# Patient Record
Sex: Male | Born: 1945 | Race: White | Hispanic: No | Smoking: Former smoker
Health system: Southern US, Community
[De-identification: ages and names within clinical notes are randomized; demographics above are authoritative.]

## PROBLEM LIST (undated history)

## (undated) DIAGNOSIS — Z973 Presence of spectacles and contact lenses: Secondary | ICD-10-CM

## (undated) DIAGNOSIS — Z86718 Personal history of other venous thrombosis and embolism: Secondary | ICD-10-CM

## (undated) DIAGNOSIS — Z5189 Encounter for other specified aftercare: Secondary | ICD-10-CM

## (undated) DIAGNOSIS — G5601 Carpal tunnel syndrome, right upper limb: Secondary | ICD-10-CM

## (undated) DIAGNOSIS — I25810 Atherosclerosis of coronary artery bypass graft(s) without angina pectoris: Secondary | ICD-10-CM

## (undated) DIAGNOSIS — M1990 Unspecified osteoarthritis, unspecified site: Secondary | ICD-10-CM

## (undated) DIAGNOSIS — I2510 Atherosclerotic heart disease of native coronary artery without angina pectoris: Secondary | ICD-10-CM

## (undated) DIAGNOSIS — I10 Essential (primary) hypertension: Secondary | ICD-10-CM

## (undated) DIAGNOSIS — E785 Hyperlipidemia, unspecified: Secondary | ICD-10-CM

## (undated) DIAGNOSIS — K219 Gastro-esophageal reflux disease without esophagitis: Secondary | ICD-10-CM

## (undated) DIAGNOSIS — R7301 Impaired fasting glucose: Secondary | ICD-10-CM

## (undated) DIAGNOSIS — J302 Other seasonal allergic rhinitis: Secondary | ICD-10-CM

## (undated) DIAGNOSIS — T8859XA Other complications of anesthesia, initial encounter: Secondary | ICD-10-CM

## (undated) DIAGNOSIS — T4145XA Adverse effect of unspecified anesthetic, initial encounter: Secondary | ICD-10-CM

## (undated) HISTORY — DX: Atherosclerosis of coronary artery bypass graft(s) without angina pectoris: I25.810

## (undated) HISTORY — DX: Hyperlipidemia, unspecified: E78.5

## (undated) HISTORY — DX: Personal history of other venous thrombosis and embolism: Z86.718

## (undated) HISTORY — DX: Encounter for other specified aftercare: Z51.89

## (undated) HISTORY — DX: Carpal tunnel syndrome, right upper limb: G56.01

## (undated) HISTORY — PX: TONSILLECTOMY AND ADENOIDECTOMY: SHX28

## (undated) HISTORY — PX: EXPLORATION POST OPERATIVE OPEN HEART: SHX5061

## (undated) HISTORY — PX: APPENDECTOMY: SHX54

## (undated) HISTORY — PX: COLONOSCOPY: SHX174

## (undated) HISTORY — DX: Essential (primary) hypertension: I10

## (undated) HISTORY — PX: CORONARY ARTERY BYPASS GRAFT: SHX141

## (undated) HISTORY — DX: Impaired fasting glucose: R73.01

## (undated) HISTORY — DX: Unspecified osteoarthritis, unspecified site: M19.90

## (undated) HISTORY — PX: EYE SURGERY: SHX253

## (undated) HISTORY — PX: SINUS ENDO W/FUSION: SHX777

---

## 1978-12-12 HISTORY — PX: KNEE ARTHROCENTESIS: SUR44

## 1983-12-13 HISTORY — PX: SHOULDER ACROMIOPLASTY: SHX6093

## 2004-02-23 ENCOUNTER — Inpatient Hospital Stay (HOSPITAL_COMMUNITY): Admission: RE | Admit: 2004-02-23 | Discharge: 2004-02-24 | Payer: Self-pay | Admitting: Neurosurgery

## 2004-06-18 ENCOUNTER — Ambulatory Visit (HOSPITAL_COMMUNITY): Admission: RE | Admit: 2004-06-18 | Discharge: 2004-06-19 | Payer: Self-pay | Admitting: Interventional Cardiology

## 2004-06-24 ENCOUNTER — Inpatient Hospital Stay (HOSPITAL_COMMUNITY)
Admission: RE | Admit: 2004-06-24 | Discharge: 2004-06-28 | Payer: Self-pay | Admitting: Thoracic Surgery (Cardiothoracic Vascular Surgery)

## 2004-07-19 ENCOUNTER — Encounter (HOSPITAL_COMMUNITY): Admission: RE | Admit: 2004-07-19 | Discharge: 2004-10-17 | Payer: Self-pay | Admitting: Interventional Cardiology

## 2004-07-27 ENCOUNTER — Encounter: Admission: RE | Admit: 2004-07-27 | Discharge: 2004-07-27 | Payer: Self-pay | Admitting: Gastroenterology

## 2004-10-18 ENCOUNTER — Encounter (HOSPITAL_COMMUNITY): Admission: RE | Admit: 2004-10-18 | Discharge: 2004-10-20 | Payer: Self-pay | Admitting: Interventional Cardiology

## 2004-10-21 ENCOUNTER — Encounter (HOSPITAL_COMMUNITY): Admission: RE | Admit: 2004-10-21 | Discharge: 2005-01-19 | Payer: Self-pay | Admitting: Interventional Cardiology

## 2005-02-09 ENCOUNTER — Encounter (HOSPITAL_COMMUNITY): Admission: RE | Admit: 2005-02-09 | Discharge: 2005-05-10 | Payer: Self-pay | Admitting: Interventional Cardiology

## 2005-05-12 ENCOUNTER — Encounter (HOSPITAL_COMMUNITY): Admission: RE | Admit: 2005-05-12 | Discharge: 2005-08-10 | Payer: Self-pay | Admitting: Interventional Cardiology

## 2005-08-12 ENCOUNTER — Encounter (HOSPITAL_COMMUNITY): Admission: RE | Admit: 2005-08-12 | Discharge: 2005-11-10 | Payer: Self-pay | Admitting: Interventional Cardiology

## 2005-11-11 ENCOUNTER — Encounter (HOSPITAL_COMMUNITY): Admission: RE | Admit: 2005-11-11 | Discharge: 2006-02-09 | Payer: Self-pay | Admitting: Interventional Cardiology

## 2006-01-09 ENCOUNTER — Encounter: Admission: RE | Admit: 2006-01-09 | Discharge: 2006-01-09 | Payer: Self-pay | Admitting: Otolaryngology

## 2006-03-12 ENCOUNTER — Encounter (HOSPITAL_COMMUNITY): Admission: RE | Admit: 2006-03-12 | Discharge: 2006-06-10 | Payer: Self-pay | Admitting: Interventional Cardiology

## 2006-06-13 ENCOUNTER — Encounter (HOSPITAL_COMMUNITY): Admission: RE | Admit: 2006-06-13 | Discharge: 2006-09-11 | Payer: Self-pay | Admitting: Interventional Cardiology

## 2006-09-11 ENCOUNTER — Ambulatory Visit: Payer: Self-pay | Admitting: Family Medicine

## 2006-09-12 ENCOUNTER — Encounter (HOSPITAL_COMMUNITY): Admission: RE | Admit: 2006-09-12 | Discharge: 2006-12-11 | Payer: Self-pay | Admitting: Interventional Cardiology

## 2006-12-12 ENCOUNTER — Encounter (HOSPITAL_COMMUNITY): Admission: RE | Admit: 2006-12-12 | Discharge: 2007-03-12 | Payer: Self-pay | Admitting: Interventional Cardiology

## 2007-03-13 ENCOUNTER — Encounter (HOSPITAL_COMMUNITY): Admission: RE | Admit: 2007-03-13 | Discharge: 2007-06-11 | Payer: Self-pay | Admitting: Interventional Cardiology

## 2007-06-12 ENCOUNTER — Encounter (HOSPITAL_COMMUNITY): Admission: RE | Admit: 2007-06-12 | Discharge: 2007-09-10 | Payer: Self-pay | Admitting: Interventional Cardiology

## 2007-09-12 ENCOUNTER — Encounter (HOSPITAL_COMMUNITY): Admission: RE | Admit: 2007-09-12 | Discharge: 2007-12-11 | Payer: Self-pay | Admitting: Interventional Cardiology

## 2007-12-13 ENCOUNTER — Encounter (HOSPITAL_COMMUNITY): Admission: RE | Admit: 2007-12-13 | Discharge: 2008-01-11 | Payer: Self-pay | Admitting: Interventional Cardiology

## 2008-01-13 ENCOUNTER — Encounter (HOSPITAL_COMMUNITY): Admission: RE | Admit: 2008-01-13 | Discharge: 2008-04-12 | Payer: Self-pay | Admitting: Interventional Cardiology

## 2008-04-13 ENCOUNTER — Encounter (HOSPITAL_COMMUNITY): Admission: RE | Admit: 2008-04-13 | Discharge: 2008-07-12 | Payer: Self-pay | Admitting: Interventional Cardiology

## 2008-04-22 ENCOUNTER — Encounter: Admission: RE | Admit: 2008-04-22 | Discharge: 2008-04-22 | Payer: Self-pay | Admitting: Gastroenterology

## 2008-07-13 ENCOUNTER — Encounter (HOSPITAL_COMMUNITY): Admission: RE | Admit: 2008-07-13 | Discharge: 2008-10-11 | Payer: Self-pay | Admitting: Interventional Cardiology

## 2008-10-13 ENCOUNTER — Encounter (HOSPITAL_COMMUNITY): Admission: RE | Admit: 2008-10-13 | Discharge: 2009-01-11 | Payer: Self-pay | Admitting: Interventional Cardiology

## 2009-01-12 ENCOUNTER — Encounter (HOSPITAL_COMMUNITY): Admission: RE | Admit: 2009-01-12 | Discharge: 2009-04-12 | Payer: Self-pay | Admitting: Interventional Cardiology

## 2009-03-17 ENCOUNTER — Ambulatory Visit: Payer: Self-pay | Admitting: Family Medicine

## 2009-03-19 ENCOUNTER — Ambulatory Visit: Payer: Self-pay | Admitting: Family Medicine

## 2009-04-13 ENCOUNTER — Encounter (HOSPITAL_COMMUNITY): Admission: RE | Admit: 2009-04-13 | Discharge: 2009-07-12 | Payer: Self-pay | Admitting: Interventional Cardiology

## 2009-07-13 ENCOUNTER — Encounter (HOSPITAL_COMMUNITY): Admission: RE | Admit: 2009-07-13 | Discharge: 2009-10-11 | Payer: Self-pay | Admitting: Interventional Cardiology

## 2009-10-12 ENCOUNTER — Encounter (HOSPITAL_COMMUNITY): Admission: RE | Admit: 2009-10-12 | Discharge: 2010-01-10 | Payer: Self-pay | Admitting: Interventional Cardiology

## 2010-01-12 ENCOUNTER — Encounter (HOSPITAL_COMMUNITY): Admission: RE | Admit: 2010-01-12 | Discharge: 2010-04-12 | Payer: Self-pay | Admitting: Interventional Cardiology

## 2010-04-13 ENCOUNTER — Encounter (HOSPITAL_COMMUNITY): Admission: RE | Admit: 2010-04-13 | Discharge: 2010-07-12 | Payer: Self-pay | Admitting: Interventional Cardiology

## 2010-07-13 ENCOUNTER — Encounter (HOSPITAL_COMMUNITY): Admission: RE | Admit: 2010-07-13 | Discharge: 2010-10-11 | Payer: Self-pay | Admitting: Interventional Cardiology

## 2010-10-12 ENCOUNTER — Encounter (HOSPITAL_COMMUNITY)
Admission: RE | Admit: 2010-10-12 | Discharge: 2011-01-10 | Payer: Self-pay | Source: Home / Self Care | Attending: Interventional Cardiology | Admitting: Interventional Cardiology

## 2011-01-02 ENCOUNTER — Encounter: Payer: Self-pay | Admitting: Gastroenterology

## 2011-01-02 ENCOUNTER — Encounter: Payer: Self-pay | Admitting: Thoracic Surgery (Cardiothoracic Vascular Surgery)

## 2011-01-13 ENCOUNTER — Encounter (HOSPITAL_COMMUNITY): Payer: Self-pay | Attending: Interventional Cardiology

## 2011-01-13 DIAGNOSIS — Z5189 Encounter for other specified aftercare: Secondary | ICD-10-CM | POA: Insufficient documentation

## 2011-01-13 DIAGNOSIS — I10 Essential (primary) hypertension: Secondary | ICD-10-CM | POA: Insufficient documentation

## 2011-01-13 DIAGNOSIS — Z8249 Family history of ischemic heart disease and other diseases of the circulatory system: Secondary | ICD-10-CM | POA: Insufficient documentation

## 2011-01-13 DIAGNOSIS — I2510 Atherosclerotic heart disease of native coronary artery without angina pectoris: Secondary | ICD-10-CM | POA: Insufficient documentation

## 2011-01-14 ENCOUNTER — Encounter (HOSPITAL_COMMUNITY): Payer: Self-pay

## 2011-01-18 ENCOUNTER — Encounter (HOSPITAL_COMMUNITY): Payer: Self-pay

## 2011-01-19 ENCOUNTER — Encounter (HOSPITAL_COMMUNITY): Payer: Self-pay | Attending: Interventional Cardiology

## 2011-01-20 ENCOUNTER — Encounter (HOSPITAL_COMMUNITY): Payer: Self-pay

## 2011-01-21 ENCOUNTER — Encounter (HOSPITAL_COMMUNITY): Payer: Self-pay

## 2011-01-25 ENCOUNTER — Encounter (HOSPITAL_COMMUNITY): Payer: Self-pay

## 2011-01-26 ENCOUNTER — Encounter (HOSPITAL_COMMUNITY): Payer: Self-pay

## 2011-01-27 ENCOUNTER — Encounter (HOSPITAL_COMMUNITY): Payer: Self-pay

## 2011-01-28 ENCOUNTER — Encounter (HOSPITAL_COMMUNITY): Payer: Self-pay

## 2011-02-01 ENCOUNTER — Encounter (HOSPITAL_COMMUNITY): Payer: Self-pay

## 2011-02-03 ENCOUNTER — Encounter (HOSPITAL_COMMUNITY): Payer: Self-pay

## 2011-02-04 ENCOUNTER — Encounter (HOSPITAL_COMMUNITY): Payer: Self-pay

## 2011-02-08 ENCOUNTER — Encounter (HOSPITAL_COMMUNITY): Payer: Self-pay

## 2011-02-10 ENCOUNTER — Encounter (HOSPITAL_COMMUNITY): Payer: Self-pay | Attending: Interventional Cardiology

## 2011-02-10 DIAGNOSIS — Z8249 Family history of ischemic heart disease and other diseases of the circulatory system: Secondary | ICD-10-CM | POA: Insufficient documentation

## 2011-02-10 DIAGNOSIS — I10 Essential (primary) hypertension: Secondary | ICD-10-CM | POA: Insufficient documentation

## 2011-02-10 DIAGNOSIS — Z5189 Encounter for other specified aftercare: Secondary | ICD-10-CM | POA: Insufficient documentation

## 2011-02-10 DIAGNOSIS — I2510 Atherosclerotic heart disease of native coronary artery without angina pectoris: Secondary | ICD-10-CM | POA: Insufficient documentation

## 2011-02-11 ENCOUNTER — Encounter (HOSPITAL_COMMUNITY): Payer: Self-pay

## 2011-02-15 ENCOUNTER — Encounter (HOSPITAL_COMMUNITY): Payer: Self-pay

## 2011-02-17 ENCOUNTER — Encounter (HOSPITAL_COMMUNITY): Payer: Self-pay

## 2011-02-18 ENCOUNTER — Encounter (HOSPITAL_COMMUNITY): Payer: Self-pay

## 2011-02-22 ENCOUNTER — Encounter (HOSPITAL_COMMUNITY): Payer: Self-pay

## 2011-02-23 ENCOUNTER — Encounter (HOSPITAL_COMMUNITY): Payer: Self-pay

## 2011-02-24 ENCOUNTER — Encounter (HOSPITAL_COMMUNITY): Payer: Self-pay

## 2011-02-25 ENCOUNTER — Encounter (HOSPITAL_COMMUNITY): Payer: Self-pay

## 2011-03-01 ENCOUNTER — Encounter (HOSPITAL_COMMUNITY): Payer: Self-pay

## 2011-03-03 ENCOUNTER — Encounter (HOSPITAL_COMMUNITY): Payer: Self-pay

## 2011-03-04 ENCOUNTER — Encounter (HOSPITAL_COMMUNITY): Payer: Self-pay

## 2011-03-08 ENCOUNTER — Encounter (HOSPITAL_COMMUNITY): Payer: Self-pay

## 2011-03-10 ENCOUNTER — Encounter (HOSPITAL_COMMUNITY): Payer: Self-pay

## 2011-03-11 ENCOUNTER — Encounter (HOSPITAL_COMMUNITY): Payer: Self-pay

## 2011-03-15 ENCOUNTER — Encounter (HOSPITAL_COMMUNITY): Payer: Self-pay | Attending: Interventional Cardiology

## 2011-03-15 DIAGNOSIS — Z8249 Family history of ischemic heart disease and other diseases of the circulatory system: Secondary | ICD-10-CM | POA: Insufficient documentation

## 2011-03-15 DIAGNOSIS — I2510 Atherosclerotic heart disease of native coronary artery without angina pectoris: Secondary | ICD-10-CM | POA: Insufficient documentation

## 2011-03-15 DIAGNOSIS — Z5189 Encounter for other specified aftercare: Secondary | ICD-10-CM | POA: Insufficient documentation

## 2011-03-15 DIAGNOSIS — I10 Essential (primary) hypertension: Secondary | ICD-10-CM | POA: Insufficient documentation

## 2011-03-17 ENCOUNTER — Encounter (HOSPITAL_COMMUNITY): Payer: Self-pay

## 2011-03-18 ENCOUNTER — Encounter (HOSPITAL_COMMUNITY): Payer: Self-pay

## 2011-03-22 ENCOUNTER — Encounter (HOSPITAL_COMMUNITY): Payer: Self-pay

## 2011-03-23 ENCOUNTER — Encounter (HOSPITAL_COMMUNITY): Payer: Self-pay

## 2011-03-24 ENCOUNTER — Encounter (HOSPITAL_COMMUNITY): Payer: Self-pay

## 2011-03-29 ENCOUNTER — Encounter (HOSPITAL_COMMUNITY): Payer: Self-pay

## 2011-03-30 ENCOUNTER — Encounter (HOSPITAL_COMMUNITY): Payer: Self-pay

## 2011-03-31 ENCOUNTER — Encounter (HOSPITAL_COMMUNITY): Payer: Self-pay

## 2011-04-05 ENCOUNTER — Encounter (HOSPITAL_COMMUNITY): Payer: Self-pay

## 2011-04-07 ENCOUNTER — Encounter (HOSPITAL_COMMUNITY): Payer: Self-pay

## 2011-04-08 ENCOUNTER — Encounter (HOSPITAL_COMMUNITY): Payer: Self-pay

## 2011-04-12 ENCOUNTER — Encounter (HOSPITAL_COMMUNITY): Payer: Self-pay | Attending: Interventional Cardiology

## 2011-04-12 DIAGNOSIS — I2510 Atherosclerotic heart disease of native coronary artery without angina pectoris: Secondary | ICD-10-CM | POA: Insufficient documentation

## 2011-04-12 DIAGNOSIS — I10 Essential (primary) hypertension: Secondary | ICD-10-CM | POA: Insufficient documentation

## 2011-04-12 DIAGNOSIS — Z8249 Family history of ischemic heart disease and other diseases of the circulatory system: Secondary | ICD-10-CM | POA: Insufficient documentation

## 2011-04-12 DIAGNOSIS — Z5189 Encounter for other specified aftercare: Secondary | ICD-10-CM | POA: Insufficient documentation

## 2011-04-14 ENCOUNTER — Encounter (HOSPITAL_COMMUNITY): Payer: Self-pay

## 2011-04-15 ENCOUNTER — Encounter (HOSPITAL_COMMUNITY): Payer: Self-pay

## 2011-04-19 ENCOUNTER — Encounter (HOSPITAL_COMMUNITY): Payer: Self-pay

## 2011-04-21 ENCOUNTER — Encounter (HOSPITAL_COMMUNITY): Payer: Self-pay

## 2011-04-22 ENCOUNTER — Encounter (HOSPITAL_COMMUNITY): Payer: Self-pay

## 2011-04-26 ENCOUNTER — Encounter (HOSPITAL_COMMUNITY): Payer: Self-pay

## 2011-04-28 ENCOUNTER — Encounter (HOSPITAL_COMMUNITY): Payer: Self-pay

## 2011-04-29 ENCOUNTER — Encounter (HOSPITAL_COMMUNITY): Payer: Self-pay

## 2011-04-29 NOTE — Cardiovascular Report (Signed)
Nathan [name], Nathan [name]                           ACCOUNT NO.:  [id number]   MEDICAL RECORD NO.:  [id number]                   PATIENT TYPE:  OIB   LOCATION:  2041                                 FACILITY:  [facility]   PHYSICIAN:  Lesleigh Noe, M.D.            DATE OF BIRTH:  08-May-1946   DATE OF PROCEDURE:  06/18/2004  DATE OF DISCHARGE:  06/19/2004                              CARDIAC CATHETERIZATION   INDICATION FOR PROCEDURE:  Early positive exercise treadmill test with  severe inferior wall ischemia by Cardiolite scintigraphy, June 10, 2004.   PROCEDURE PERFORMED:  1. Left-heart catheterization.  2. Selective coronary angiography.  3. Left ventriculography.  4. Angio-Seal arteriotomy closure.   DESCRIPTION OF PROCEDURE:  After informed consent, a 6-French sheath was  placed in the right femoral artery using the modified Seldinger technique.  A 6-French A2 multipurpose catheter was used for hemodynamic recordings.  Left ventriculography by hand injection and selective left and right  coronary angiography.  The patient tolerated the procedure without  complications.  A right femoral angiogram was performed by hand injection  into the sheath.  Arteriotomy closure with Angio-Seal was performed without  complications.   RESULTS:  1. Hemodynamic data:     A. Aortic pressure 127/66.     B. Left ventricular pressure 128/12 mmHg.  2. Left ventriculography.  The left ventricle is normal in size and     demonstrates normal overall contractility with EF of 60%.  No MR.  3. Coronary angiography:  The coronary is heavily calcified, particularly     the proximal left coronary bed.     A. Left main coronary:  Irregularities, but no significant obstruction.     B. Left anterior descending coronary:  The LAD reaches the apex but does        not extend around the apex in the intraventricular groove. There are        tight tandem proximal LAD lesions with an 80 to 90% lesion starting at    the ostium and then in the proximal vessel.  The mid vessel is patent,        containing only luminal irregularities.  Three diagonals arise from        this vessel.  No high-grade obstructions are noted in the diagonals.        The mid and distal LAD is of small caliber and somewhat diffusely        diseased with a 60 to 70% stenosis after the second diagonal.     C. Circumflex artery:  Three obtuse marginal branches arise from the        circumflex.  The circumflex contains a high-grade mid-vessel stenosis        at the origin of the second obtuse marginal.  The obtuse marginal also        contains 80 to 90% obstruction.  The third obtuse marginal is  very        large, and supplies the inferolateral wall.     D. Right coronary.  Relatively small in caliber distally.  There is a 99+        percent stenosis before the origin of the small PDA and two small left        ventricular branches.  The right coronary bed may not be graftable.        There also appears to be some collaterals in the distal bed.   CONCLUSIONS:  1. Severe three-vessel coronary disease with subtotal occlusion of the     distal right coronary and high-grade obstruction in the mid circumflex,     threatening the inferior wall.  There are tandem high-grade obstructions     in the proximal left anterior descending.  The patient has relatively     diffuse distal vessel disease, but I believe the left anterior descending     and obtuse marginals are graftable.  There is also probably the ability     to graft the posterior descending artery or left ventricular branch     beyond the high grade obstruction on the distal right.  2. Normal left ventricular function.   PLAN:  1. We will admit the patient to the hospital to expedite surgical     evaluation.  2. We will discontinue Plavix.  3. We will give Lovenox.  4. He should probably stay in the hospital until he can be operated on.                                                Lesleigh Noe, M.D.    HWS/MEDQ  D:  06/18/2004  T:  06/19/2004  Job:  [id number]   cc:   Danise Edge, M.D.  [address]  [place]  [place] [address]  Fax: [phone]   CVTS Office

## 2011-04-29 NOTE — Op Note (Signed)
Nathan [name], Nathan [name]                           ACCOUNT NO.:  [id number]   MEDICAL RECORD NO.:  [id number]                   PATIENT TYPE:  INP   LOCATION:  2304                                 FACILITY:  [facility]   PHYSICIAN:  Salvatore Decent. Cornelius Moras, M.D.              DATE OF BIRTH:  [birth year]   DATE OF PROCEDURE:  06/24/2004  DATE OF DISCHARGE:                                 OPERATIVE REPORT   PREOPERATIVE DIAGNOSIS:  Severe three vessel coronary artery disease with  new onset angina.   POSTOPERATIVE DIAGNOSIS:  Severe three vessel coronary artery disease with  new onset angina.   PROCEDURE:  Median sternotomy for coronary artery bypass grafting x3 (left  internal mammary artery to the distal left anterior descending coronary  artery, right internal mammary artery to second circumflex marginal branch,  saphenous vein graft to the right posterolateral branch, endoscopic  saphenous vein harvest from right thigh).   SURGEON:  Salvatore Decent. Cornelius Moras, M.D.   ASSISTANT:  Pecola Leisure, PA   ANESTHESIA:  General.   INDICATIONS FOR PROCEDURE:  The patient is a 65 year old male with a history  of hypertension, who presents with new onset symptoms of chest pain  consistent with angina.  He was referred to Lyn Records, M.D. and  underwent a stress Cardiolite examination that was abnormal.  He underwent  cardiac catheterization demonstrating severe three vessel coronary artery  disease with coronary anatomy unfavorable for percutaneous coronary  intervention.  Left ventricular function is normal.  A full consultation  note has been dictated previously.  The patient has been counseled at length  regarding the indications, risks, and potential benefits of surgery.  Alternative treatment strategies have been discussed.  He understands and  accepts all associated risks and desires to proceed as described.   DESCRIPTION OF PROCEDURE:  The patient is brought to the operating room on  the above  mentioned date and placed in the supine position on the operating  table.  Central monitoring is established by the anesthesia service under  the care and direction of W. Autumn Patty, M.D.  Specifically, a Swan-  Ganz catheter is placed through the right internal jugular approach.  A  radial arterial line is placed.  Intravenous antibiotics are administered.  Following induction with general endotracheal anesthesia, a Foley catheter  is placed.  By report, the intubation was somewhat difficult due to the  patient's anatomy, but the intubation proceeded uneventfully.   The patient's chest, abdomen, both groins, and both lower extremities are  prepped and draped in the usual sterile fashion.  A median sternotomy  incision is performed and the left internal mammary artery is dissected from  the chest wall and prepared for bypass grafting.  The left internal mammary  artery is good quality conduit.  Following this, the right internal mammary  artery is also dissected from the chest wall and prepared for bypass  grafting.  The right internal mammary artery is also good quality conduit.  Simultaneously saphenous vein is obtained from the patient's right thigh  using endoscopic vein harvest technique. The saphenous vein is felt to be  good quality conduit.  The patient is heparinized systemically.   The pericardium is opened.  The ascending aorta is normal in appearance.  The ascending aorta and the right atrium are cannulated for cardiopulmonary  bypass.  Adequate heparinization is verified.  Cardiopulmonary bypass is  begun and the surface of the heart is inspected.  Distal sites are selected  for coronary artery bypass grafting.  Both internal mammary arteries are  trimmed to appropriate length.  Of note, the right internal mammary artery  is fairly long, but not long enough to reach the area of the distal left  anterior descending coronary artery where the distal anastomosis will need   to be placed.  However, the right internal mammary artery will reach to the  large second circumflex marginal branch when placed through the transverse  sinus posterior to the aorta.  A temperature probe is placed in the left  ventricular septum.  A cardioplegia catheter is placed in the ascending  aorta.   The patient is allowed to cool passively to 32 degrees systemic temperature.  The aortic crossclamp is applied and cardioplegia is delivered initially in  an antegrade fashion through the aortic root.  Iced saline slush is applied  for topical hypothermia.  The initial cardioplegic arrest and myocardial  cooling are felt to be excellent.  Repeat doses of cardioplegia are  administered intermittently throughout the crossclamp portion of the  operation, both through the aortic root and down the subsequently placed  vein grafts to maintain septal temperature below 15 degrees cGy.   The following distal coronary anastomoses are performed:  1) The  posterolateral branch off the distal right coronary artery is grafted with  the saphenous vein graft in an end-to-side fashion.  This is the larger of  the two terminal branches off the distal right coronary artery.  It is still  a small vessel measuring only 1.0 mm in diameter.  It is of good quality at  the site of distal bypass.  2) The second circumflex marginal branch is  grafted with the right internal mammary artery in an end-to-side fashion.  This coronary measures 2.0 mm in diameter and is of good quality.  The right  internal mammary artery is utilized in-situ with the pedicle placed  posterior to the aorta through the transverse sinus.  3) The distal left  anterior descending coronary artery is grafted with the left internal  mammary artery in an end-to-side fashion.  This coronary is diffusely  diseased proximally, but measures 1.8 mm at the site of distal bypass and is  of good quality.  The single proximal saphenous vein graft  anastomosis is performed directly  to the ascending aorta prior to removal of the aortic crossclamp. The septal  temperature is noted to rise rapidly upon reperfusion of the internal  mammary artery grafts.  The aortic crossclamp is removed after a total  crossclamp time of 77 minutes.   The heart begins to beat spontaneously without need for cardioversion.  The  proximal and all three distal coronary anastomoses are inspected for  hemostasis and appropriate graft orientation.  Epicardial pacing wires are  affixed to the right ventricular outflow tract and to the right atrial  appendage.  The patient is rewarmed to 37 degrees cGy temperature.  Normal  sinus rhythm resumes spontaneously.  Atrial pacing is employed to increase  the heart rate.  The patient is weaned from cardiopulmonary bypass without  difficulty.  The patient's rhythm at separation from bypass is atrial paced.  NO inotropic support is required.  Total cardiopulmonary bypass time for the  operation is 95 minutes.   The venous and arterial cannuli are removed uneventfully.  Protamine is  administered to reverse the anticoagulation.  The mediastinum and both left  and right pleural spaces are irrigated with saline solution containing  Vancomycin.  Meticulous surgical hemostasis is ascertained.  There is  moderate coagulopathy.  All possible sites of mechanical bleeding are  carefully identified.  The patient is ultimately transfused one 10-pack of  adult platelets due to continued mild coagulopathy with a history of  preoperative Plavix administration.  The bleeding seems to stop  appropriately after platelets have been administered.   The mediastinum and both pleural spaces are drained with four chest tubes  placed through separate stab incisions inferiorly.  The median sternotomy is  closed with double-strength sternal wire.  The soft tissues anterior to the  sternum are closed in multiple layers and the skin is closed  with a running  subcuticular skin closure.   The patient tolerated the procedure well and is transported to the surgical  intensive care unit in stable condition.  There are no intraoperative  complications.  All needle, sponge, and instrument counts correct at  completion of the operation.                                               Salvatore Decent. Cornelius Moras, M.D.    CHO/MEDQ  D:  06/24/2004  T:  06/24/2004  Job:  [id number]   cc:   Lesleigh Noe, M.D.  [address]  [address]  [place]  [place] [address]  Fax: [phone]   Danise Edge, M.D.  [address]  [place]  [place] [address]  Fax: [phone]

## 2011-04-29 NOTE — Consult Note (Signed)
[name], [name]                           ACCOUNT NO.:  [id number]   MEDICAL RECORD NO.:  [id number]                   PATIENT TYPE:  OIB   LOCATION:  2041                                 FACILITY:  [facility]   PHYSICIAN:  Salvatore Decent. Cornelius Moras, M.D.              DATE OF BIRTH:  1946-06-15   DATE OF CONSULTATION:  06/18/2004  DATE OF DISCHARGE:  06/19/2004                                   CONSULTATION   PRIMARY CARE PHYSICIAN:  Danise Edge, M.D.   REASON FOR CONSULTATION:  Severe three vessel coronary artery disease with  progressive angina.   HISTORY OF PRESENT ILLNESS:  Nathan [name] is a 65 year old gentleman from  [place] with no previous cardiac history.  Risk factor is notable only  for a history of hypertension.  His lipid status is unknown.  He has a  remote history of tobacco use.  He was in his usual state of health until  approximately three weeks ago when he developed new onset exertional chest  pain suspicious for angina.  His symptoms progressed over the next few  weeks.  Last week, he had two particularly severe episodes of pain which  persisted for a while after he had stopped to rest.  Some of his symptoms  were associated with meals.   He was evaluated by Dr. Danise Edge and it was felt that his symptoms  were suspicious for angina but could also be consistent for esophagitis.  He  was taken off of aspirin and started on Protonix and scheduled for a stress  Cardiolite exam.  He underwent a stress Cardiolite exam on June 11, 2004.  This was notable for early positive changes consistent with ischemia  including ST segment depression in the inferior leads within the first five  to six minutes of exercise in the standard Bruce protocol.  He had  additional ST segment depression in the inferior leads during recovery and  Cardiolite images demonstrated inferolateral ischemia over a large  pronounced area.  His resting ejection fraction was 60%.   Mr. [name] was  subsequently brought in for elective cardiac catheterization  today.  He was started on oral Plavix after his stress test on June 11, 2004.  Since then, he has had no further symptoms of chest pain.   Cardiac catheterization today demonstrates severe three vessel coronary  artery disease with coronary anatomy unfavorable for percutaneous coronary  intervention.  Left ventricular function is preserved.  Cardiac surgical  consultation has been requested.   REVIEW OF SYMPTOMS:  GENERAL:  Mr. [name] reports feeling well otherwise with  good appetite.  He does note tendency to early fatigue.  His appetite is  good and his weight is stable.  CARDIAC:  Notable for symptoms of  progressive angina with two episodes of prolonged chest pain last week prior  to his stress Cardiolite exam.  He has had no episodes of chest pain  since  then.  He denies any nocturnal angina.  He denies any prolonged episodes of  chest pain occurring at rest.  He denies any significant shortness of breath  either with exertion or at rest.  He denies any history of PND, orthopnea,  or lower extremity edema.  He has not had palpitations or syncopal episodes.  RESPIRATORY:  Negative.  The patient reports no productive cough,  hemoptysis, or wheezing.  GASTROINTESTINAL:  Negative.  The patient has no  difficulty swallowing.  He denies symptoms suggestive of reflux.  He reports  normal bowel function and he denies history of hematochezia, hematemesis, or  melena.  MUSCULOSKELETAL:  Notable for chronic problems related to  degenerative disease in his neck and lower back.  Most recently, his  symptoms consist of persistent numbness involving the right upper extremity  and the left lower extremity as well as some pain in his neck.  NEUROLOGIC:  Notable for numbness as described above.  The numbness in the left leg has  persisted since last March and the numbness in the right arm has persisted  over the last several weeks.   GENITOURINARY:  Negative.  INFECTIOUS:  Negative.  HEENT:  Notable for recent root canal.  PSYCHIATRIC:  Notable in  that the patient admits to having problems increased stress in his life  recently.  He is nonspecific.   PAST MEDICAL HISTORY:  1. Hypertension.  2. 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  He is followed carefully by Dr. Hewitt Shorts and underwent     surgery for his cervical spine with cadaveric bone graft in March of this     year.  3. The patient specifically denies any known history of diabetes mellitus,     previous stroke, previous myocardial infarction, or congestive heart     failure.   PAST SURGICAL HISTORY:  1. Cervical disc surgery with cadaveric bone grafting March 2005.  2. Right knee surgery in the distant past.  3. Right leg surgery in the distant past.  4. Repair of right clavicle fracture and shoulder dislocation in the distant     past.   FAMILY HISTORY:  Notable for the absence of premature coronary artery  disease.   SOCIAL HISTORY:  The patient is single and lives alone here in [place].  He is a nonsmoker and reports that he smoked briefly many years ago but quit  smoking completely in 1966.  He denies excessive alcohol consumption.  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   CURRENT MEDICATIONS:  1. Protonix 40 mg q.d.  2. Toprol XL 100 mg q.d.  3. Plavix 75 mg q.d. (this was started June 11, 2004).  4. Claritin over-the-counter.   ALLERGIES:  None known.   PHYSICAL EXAMINATION:  GENERAL:  Notable for well-appearing male who appears  his stated age and in no acute distress.  VITAL SIGNS:  He is afebrile.  HEENT:  Grossly unrevealing.  NECK:  Supple.  There is no cervical nor supraclavicular lymphadenopathy.  There is no jugular venous distention.  No carotid bruits are noted. CHEST:   Auscultation of the chest includes clear and symmetrical breath  sounds bilaterally.  No wheezes or rhonchi or demonstrated.  CARDIOVASCULAR:  Regular rate and rhythm.  No murmurs, rubs, or gallops are  noted.  ABDOMEN:  Soft and nontender.  Bowel sounds are present.  EXTREMITIES:  Warm and well perfused.  There is no lower extremity edema.  PULSES:  Distal pulses are palpable in both of the legs at the ankle.  RECTAL:  Deferred.  NEUROLOGICAL:  Grossly nonfocal and symmetric throughout.   LABORATORY DATA:  A 12-lead electrocardiogram demonstrates normal sinus  rhythm with no acute ST or T-wave changes.   Baseline laboratory data includes sodium 139, potassium 4.3, chloride 103,  bicarbonate 28, BUN 14, creatinine 0.9, glucose 98.  Liver function profile  is within normal limits.  Complete blood count includes hemoglobin 14.3,  hematocrit 43.9%, platelet count 248,000, white blood count 5100.  Coagulation profile is normal with INR 1.1 and PTT 29 seconds.   PROCEDURE:  Cardiac catheterization performed today by Dr. Lyn Records  has been reviewed.  This demonstrates severe three vessel coronary artery  disease with preserved left ventricular function, specifically there is 80%  proximal and 90% mid-stenosis of the left anterior descending artery.  The  left circumflex coronary artery disease gives rise to a large second  circumflex marginal branch.  There is 95% stenosis of the mid-left  circumflex coronary artery arising and involving the takeoff of a small  first second marginal branch.  There is 99% subtotal obstruction of the  distal right coronary artery at the level of its bifurcation.  The posterior  descending coronary artery and the posterolateral branch off the distal  right coronary artery are both relatively small and possibly diffusely  diseased.  Left ventricular function appears normal with no significant wall  motion abnormalities.   IMPRESSION:  Severe three vessel  coronary artery disease with progressive  symptoms of angina and an early positive stress Cardiolite exam:  Mr. Hanrahan  coronary anatomy appears unfavorable for percutaneous coronary intervention.  I believe he would best be treated by coronary artery bypass grafting.  He  has been treated with Plavix since June 11, 2004 and I feel he would  therefore benefit from at least five to seven days of delay prior to surgery  to allow the Plavix to get out of his system.  I have outlined options at  length with Mr. Michalik and his friend here in the catheter lab holding area  this afternoon.  All their questions have been addressed.  Alternative to  treatment strategies have been discussed at length.  Mr. Fluharty understands  and accepts all the associated risks of surgery including but not limited to  risks of death, stroke, myocardial infarction, congestive heart failure,  respiratory failure, pneumonia, bleeding requiring blood transfusion, arrhythmia, infection, and recurrent coronary artery disease.  We  tentatively plan to proceed with surgery on Thursday, June 24, 2004.                                               Salvatore Decent. Cornelius Moras, M.D.    CHO/MEDQ  D:  06/18/2004  T:  06/19/2004  Job:  [id number]   cc:   Lesleigh Noe, M.D.  [address]  [address]  [place]  [place] [address]  Fax: [phone]   Danise Edge, M.D.  [address]  [place]  [place] [address]  Fax: [phone]   Patient's chart

## 2011-04-29 NOTE — Discharge Summary (Signed)
Nathan [name], Nathan [name]                           ACCOUNT NO.:  [id number]   MEDICAL RECORD NO.:  [id number]                   PATIENT TYPE:  INP   LOCATION:  2028                                 FACILITY:  [facility]   PHYSICIAN:  Salvatore Decent. Cornelius Moras, M.D.              DATE OF BIRTH:  [birth year]   DATE OF ADMISSION:  DATE OF DISCHARGE:  06/28/2004                                 DISCHARGE SUMMARY   HISTORY OF PRESENT ILLNESS:  This is a 65 year old male patient. He has a  history of hypertension. Recently, he has developed over approximately 3  weeks exertional chest pain.  He recently returned from [place], [place] and  while there, he walked about 4 miles a day without significant problems,  with chest pain.  During the latter part of the visit, apparently, he was  having some very short, less than 5 minute episodes of chest pain and that  he could continue to walk through. He has not had any rest pain since  symptoms have started.   After arriving back in [place], he began having progressive duration  of exertional chest pain that he was able to walk through, but the chest  pain would only occur with walking without any associated symptoms such as  nausea, diaphoresis or shortness of breath.   The patient was recently evaluated by Dr. Laural Benes and stress testing was  recommended.  The patient had symptoms prior to his stress test and  subsequently a Cardiolite was scheduled at Dr. Michaelle Copas office.  He had  significant ST segment depression, up to 2 mm during exercise. He exercised  for a total of 5-6 minutes.  He reached Bruce protocol stage 2 with METS of  7.0.  He did achieve his target heart rate but due to the EKG changes, he  was stopped at Bruce stage 2.   During his recovery, the patient had significant ST depression.  At 1-2 of  recovery, he was having 2 mm ST segment depression, initially horizontal and  eventually progressing to down-sloping.  After 16 minutes of recovery,  he  returned to his baseline. His Cardiolite images were highly abnormal and  showed inferolateral ischemia over a large, pronounced area.  Also, he had  inferobasilar thickening with an EF of 60%.  He was felt to require  admission for cardiac catheterization and the patient was admitted this  hospitalization for the procedure.   PAST MEDICAL HISTORY:  1. 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  2. Hypertension.  3. Recent root canal.  4. Current lipid status unknown.   ALLERGIES:  No known drug allergies.   MEDICATIONS PRIOR TO ADMISSION:  1. Protonix 40 mg daily.  2. Toprol  XL 100 mg daily.  3. Plavix 75 mg daily.  4. Over-the-counter usage of Claritin.   FAMILY HISTORY, REVIEW OF SYSTEMS, PHYSICAL EXAM:  Please see the history  and physical done at time of admission.   HOSPITAL COURSE:  The patient was admitted for cardiac catheterization which  was performed by Dr. Katrinka Blazing.  Patient was found to have severe 3 vessel  coronary artery disease with coronary anatomy unfavorable for percutaneous  coronary intervention.  The patient was referred to Dr. Tressie Stalker who  evaluated the patient and his studies and recommended to proceed with  surgical revascularization.   PROCEDURE:  On June 24, 2004, the patient was taken to the operating room  where he underwent the following procedures:  Coronary artery bypass  grafting x3. The following grafts were placed:  1. Left internal mammary artery to the distal left anterior descending     artery coronary artery.  2. Right internal mammary artery to the second circumflex marginal branch.  3. Saphenous vein graft to the right posterolateral branch.   The patient tolerated the procedure well, was taken to the surgical  intensive care unit in stable condition.   POSTOPERATIVE HOSPITAL COURSE:  The patient has done well. He has maintained  stable hemodynamics.  His EKG postop showed no acute change. He was weaned   from routine lines, monitors, drainage devices and ventilator without  difficulty. He has remained in normal sinus rhythm without significant  ectopy or cardiac dysrhythmias. The patient does have a postoperative  anemia, he did require transfusion on June 27, 2004 for hemoglobin of 7.5.  He has responded to this clinically quite well. His most recent hemoglobin  and hematocrit dated June 26, 2004 were 8.8 and 25.1 respectively.  His  incisions are healing well without signs of infection. He is tolerating  routine advancement of activity using cardiac rehabilitation phase 1  modalities without significant difficulty.  The patient has been weaned from  oxygen and maintained good saturations on room air.   Overall, the patient was felt to be quite stable for tentative discharge in  the morning of June 28, 2004, pending morning round reevaluation.   DISCHARGE MEDICATIONS:  1. Aspirin 325 mg 1 daily.  2. Zocor 20 mg daily.  3. Lasix 40 mg daily for 7 days.  4. K-Dur 20 mEq daily for 7 days.  5. Toprol  XL 25 mg daily.  6. For pain, Ultram 50 mg 1-2 p.o. q.4-6h. as needed for pain.   INSTRUCTIONS:  The patient received written instructions in regards to  medications, activity, diet, wound care and follow-up.   FOLLOW UP:  With Dr. Porfirio Oar in 3 weeks, Dr. Katrinka Blazing 2 weeks.   CONDITION ON DISCHARGE:  Stable and improved.   FINAL DIAGNOSIS:  Severe 3-vessel coronary artery disease, now status post  surgical revascularization.   OTHER DIAGNOSES:  1. History of hypertension.  2. Postoperative anemia.      Rowe Clack, P.A.-C.                    Salvatore Decent. Cornelius Moras, M.D.    Sherryll Burger  D:  06/27/2004  T:  06/27/2004  Job:  [id number]   cc:   Salvatore Decent. Cornelius Moras, M.D.  [address]  [place]  [place] [address]   Lyn Records III, M.D.  [address]  [place]  [place] [address]  Fax: [phone]   Danise Edge, M.D. [address]  [place]  [place] [address]  Fax:  [phone]   Hewitt Shorts, M.D.  [address]  [place]  [place] [address]  Fax: [phone]

## 2011-05-03 ENCOUNTER — Encounter (HOSPITAL_COMMUNITY): Payer: Self-pay

## 2011-05-05 ENCOUNTER — Encounter (HOSPITAL_COMMUNITY): Payer: Self-pay

## 2011-05-06 ENCOUNTER — Encounter (HOSPITAL_COMMUNITY): Payer: Self-pay

## 2011-05-10 ENCOUNTER — Encounter (HOSPITAL_COMMUNITY): Payer: Self-pay

## 2011-05-12 ENCOUNTER — Encounter (HOSPITAL_COMMUNITY): Payer: Self-pay

## 2011-05-13 ENCOUNTER — Encounter (HOSPITAL_COMMUNITY): Payer: Self-pay | Attending: Interventional Cardiology

## 2011-05-13 DIAGNOSIS — I10 Essential (primary) hypertension: Secondary | ICD-10-CM | POA: Insufficient documentation

## 2011-05-13 DIAGNOSIS — Z5189 Encounter for other specified aftercare: Secondary | ICD-10-CM | POA: Insufficient documentation

## 2011-05-13 DIAGNOSIS — Z8249 Family history of ischemic heart disease and other diseases of the circulatory system: Secondary | ICD-10-CM | POA: Insufficient documentation

## 2011-05-13 DIAGNOSIS — I2510 Atherosclerotic heart disease of native coronary artery without angina pectoris: Secondary | ICD-10-CM | POA: Insufficient documentation

## 2011-05-16 ENCOUNTER — Encounter (HOSPITAL_COMMUNITY): Payer: Self-pay

## 2011-05-17 ENCOUNTER — Encounter (HOSPITAL_COMMUNITY): Payer: Self-pay

## 2011-05-19 ENCOUNTER — Encounter (HOSPITAL_COMMUNITY): Payer: Self-pay

## 2011-05-20 ENCOUNTER — Encounter (HOSPITAL_COMMUNITY): Payer: Self-pay

## 2011-05-24 ENCOUNTER — Encounter (HOSPITAL_COMMUNITY): Payer: Self-pay

## 2011-05-25 ENCOUNTER — Encounter (HOSPITAL_COMMUNITY): Payer: Self-pay

## 2011-05-26 ENCOUNTER — Encounter (HOSPITAL_COMMUNITY): Payer: Self-pay

## 2011-05-27 ENCOUNTER — Encounter (HOSPITAL_COMMUNITY): Payer: Self-pay

## 2011-05-31 ENCOUNTER — Encounter (HOSPITAL_COMMUNITY): Payer: Self-pay

## 2011-06-02 ENCOUNTER — Encounter (HOSPITAL_COMMUNITY): Payer: Self-pay

## 2011-06-03 ENCOUNTER — Encounter (HOSPITAL_COMMUNITY): Payer: Self-pay

## 2011-06-07 ENCOUNTER — Encounter (HOSPITAL_COMMUNITY): Payer: Self-pay

## 2011-06-08 ENCOUNTER — Encounter (HOSPITAL_COMMUNITY): Payer: Self-pay

## 2011-06-09 ENCOUNTER — Encounter (HOSPITAL_COMMUNITY): Payer: Self-pay

## 2011-06-10 ENCOUNTER — Encounter (HOSPITAL_COMMUNITY): Payer: Self-pay

## 2011-06-14 ENCOUNTER — Encounter (HOSPITAL_COMMUNITY): Payer: Self-pay | Attending: Interventional Cardiology

## 2011-06-14 DIAGNOSIS — Z8249 Family history of ischemic heart disease and other diseases of the circulatory system: Secondary | ICD-10-CM | POA: Insufficient documentation

## 2011-06-14 DIAGNOSIS — I10 Essential (primary) hypertension: Secondary | ICD-10-CM | POA: Insufficient documentation

## 2011-06-14 DIAGNOSIS — Z5189 Encounter for other specified aftercare: Secondary | ICD-10-CM | POA: Insufficient documentation

## 2011-06-14 DIAGNOSIS — I2510 Atherosclerotic heart disease of native coronary artery without angina pectoris: Secondary | ICD-10-CM | POA: Insufficient documentation

## 2011-06-16 ENCOUNTER — Encounter (HOSPITAL_COMMUNITY): Payer: Self-pay

## 2011-06-17 ENCOUNTER — Encounter (HOSPITAL_COMMUNITY): Payer: Self-pay

## 2011-06-21 ENCOUNTER — Encounter (HOSPITAL_COMMUNITY): Payer: Self-pay

## 2011-06-23 ENCOUNTER — Encounter (HOSPITAL_COMMUNITY): Payer: Self-pay

## 2011-06-24 ENCOUNTER — Encounter (HOSPITAL_COMMUNITY): Payer: Self-pay

## 2011-06-28 ENCOUNTER — Encounter (HOSPITAL_COMMUNITY): Payer: Self-pay

## 2011-06-30 ENCOUNTER — Encounter (HOSPITAL_COMMUNITY): Payer: Self-pay

## 2011-07-01 ENCOUNTER — Encounter (HOSPITAL_COMMUNITY): Payer: Self-pay

## 2011-07-05 ENCOUNTER — Encounter (HOSPITAL_COMMUNITY): Payer: Self-pay

## 2011-07-07 ENCOUNTER — Encounter (HOSPITAL_COMMUNITY): Payer: Self-pay

## 2011-07-08 ENCOUNTER — Encounter (HOSPITAL_COMMUNITY): Payer: Self-pay

## 2011-07-12 ENCOUNTER — Encounter (HOSPITAL_COMMUNITY): Payer: Self-pay

## 2011-07-14 ENCOUNTER — Encounter (HOSPITAL_COMMUNITY): Payer: Self-pay | Attending: Interventional Cardiology

## 2011-07-14 DIAGNOSIS — Z8249 Family history of ischemic heart disease and other diseases of the circulatory system: Secondary | ICD-10-CM | POA: Insufficient documentation

## 2011-07-14 DIAGNOSIS — I2510 Atherosclerotic heart disease of native coronary artery without angina pectoris: Secondary | ICD-10-CM | POA: Insufficient documentation

## 2011-07-14 DIAGNOSIS — Z5189 Encounter for other specified aftercare: Secondary | ICD-10-CM | POA: Insufficient documentation

## 2011-07-14 DIAGNOSIS — I10 Essential (primary) hypertension: Secondary | ICD-10-CM | POA: Insufficient documentation

## 2011-07-15 ENCOUNTER — Encounter (HOSPITAL_COMMUNITY): Payer: Self-pay

## 2011-07-19 ENCOUNTER — Encounter (HOSPITAL_COMMUNITY): Payer: Self-pay

## 2011-07-20 ENCOUNTER — Encounter (HOSPITAL_COMMUNITY): Payer: Self-pay

## 2011-07-21 ENCOUNTER — Encounter (HOSPITAL_COMMUNITY): Payer: Self-pay

## 2011-07-22 ENCOUNTER — Encounter (HOSPITAL_COMMUNITY): Payer: Self-pay

## 2011-07-26 ENCOUNTER — Encounter (HOSPITAL_COMMUNITY): Payer: Self-pay

## 2011-07-28 ENCOUNTER — Encounter (HOSPITAL_COMMUNITY): Payer: Self-pay

## 2011-07-29 ENCOUNTER — Encounter (HOSPITAL_COMMUNITY): Payer: Self-pay

## 2011-08-02 ENCOUNTER — Encounter (HOSPITAL_COMMUNITY): Payer: Self-pay

## 2011-08-04 ENCOUNTER — Encounter (HOSPITAL_COMMUNITY): Payer: Self-pay

## 2011-08-05 ENCOUNTER — Encounter (HOSPITAL_COMMUNITY): Payer: Self-pay

## 2011-08-09 ENCOUNTER — Encounter (HOSPITAL_COMMUNITY): Payer: Self-pay

## 2011-08-11 ENCOUNTER — Encounter (HOSPITAL_COMMUNITY): Payer: Self-pay

## 2011-08-12 ENCOUNTER — Encounter (HOSPITAL_COMMUNITY): Payer: Self-pay

## 2011-08-16 ENCOUNTER — Encounter (HOSPITAL_COMMUNITY): Payer: Self-pay | Attending: Interventional Cardiology

## 2011-08-16 DIAGNOSIS — Z5189 Encounter for other specified aftercare: Secondary | ICD-10-CM | POA: Insufficient documentation

## 2011-08-16 DIAGNOSIS — Z8249 Family history of ischemic heart disease and other diseases of the circulatory system: Secondary | ICD-10-CM | POA: Insufficient documentation

## 2011-08-16 DIAGNOSIS — I10 Essential (primary) hypertension: Secondary | ICD-10-CM | POA: Insufficient documentation

## 2011-08-16 DIAGNOSIS — I2510 Atherosclerotic heart disease of native coronary artery without angina pectoris: Secondary | ICD-10-CM | POA: Insufficient documentation

## 2011-08-18 ENCOUNTER — Encounter (HOSPITAL_COMMUNITY): Payer: Self-pay

## 2011-08-19 ENCOUNTER — Encounter (HOSPITAL_COMMUNITY): Payer: Self-pay

## 2011-08-23 ENCOUNTER — Encounter (HOSPITAL_COMMUNITY): Payer: Self-pay

## 2011-08-24 ENCOUNTER — Encounter (HOSPITAL_COMMUNITY): Payer: Self-pay

## 2011-08-25 ENCOUNTER — Encounter (HOSPITAL_COMMUNITY): Payer: Self-pay

## 2011-08-26 ENCOUNTER — Encounter (HOSPITAL_COMMUNITY): Payer: Self-pay

## 2011-08-30 ENCOUNTER — Encounter (HOSPITAL_COMMUNITY): Payer: Self-pay

## 2011-09-01 ENCOUNTER — Encounter (HOSPITAL_COMMUNITY): Payer: Self-pay

## 2011-09-02 ENCOUNTER — Encounter (HOSPITAL_COMMUNITY): Payer: Self-pay

## 2011-09-06 ENCOUNTER — Encounter (HOSPITAL_COMMUNITY): Payer: Self-pay

## 2011-09-08 ENCOUNTER — Encounter (HOSPITAL_COMMUNITY): Payer: Self-pay

## 2011-09-09 ENCOUNTER — Encounter (HOSPITAL_COMMUNITY): Payer: Self-pay

## 2011-09-13 ENCOUNTER — Encounter (HOSPITAL_COMMUNITY): Admission: RE | Admit: 2011-09-13 | Payer: Self-pay | Source: Ambulatory Visit

## 2011-09-13 DIAGNOSIS — I2510 Atherosclerotic heart disease of native coronary artery without angina pectoris: Secondary | ICD-10-CM | POA: Insufficient documentation

## 2011-09-13 DIAGNOSIS — I10 Essential (primary) hypertension: Secondary | ICD-10-CM | POA: Insufficient documentation

## 2011-09-13 DIAGNOSIS — Z8249 Family history of ischemic heart disease and other diseases of the circulatory system: Secondary | ICD-10-CM | POA: Insufficient documentation

## 2011-09-13 DIAGNOSIS — Z5189 Encounter for other specified aftercare: Secondary | ICD-10-CM | POA: Insufficient documentation

## 2011-09-15 ENCOUNTER — Encounter (HOSPITAL_COMMUNITY): Payer: Self-pay | Attending: Interventional Cardiology

## 2011-09-16 ENCOUNTER — Encounter (HOSPITAL_COMMUNITY): Payer: Self-pay

## 2011-09-20 ENCOUNTER — Encounter (HOSPITAL_COMMUNITY): Payer: Self-pay

## 2011-09-21 ENCOUNTER — Encounter (HOSPITAL_COMMUNITY): Payer: Self-pay

## 2011-09-22 ENCOUNTER — Encounter (HOSPITAL_COMMUNITY): Payer: Self-pay

## 2011-09-23 ENCOUNTER — Encounter (HOSPITAL_COMMUNITY): Payer: Self-pay

## 2011-09-27 ENCOUNTER — Encounter (HOSPITAL_COMMUNITY): Payer: Self-pay

## 2011-09-28 ENCOUNTER — Encounter (HOSPITAL_COMMUNITY): Payer: Self-pay

## 2011-09-29 ENCOUNTER — Encounter (HOSPITAL_COMMUNITY): Payer: Self-pay

## 2011-09-30 ENCOUNTER — Encounter (HOSPITAL_COMMUNITY): Payer: Self-pay

## 2011-10-04 ENCOUNTER — Encounter (HOSPITAL_COMMUNITY): Payer: Self-pay

## 2011-10-06 ENCOUNTER — Encounter (HOSPITAL_COMMUNITY): Payer: Self-pay

## 2011-10-07 ENCOUNTER — Encounter (HOSPITAL_COMMUNITY): Payer: Self-pay

## 2011-10-11 ENCOUNTER — Encounter (HOSPITAL_COMMUNITY): Payer: Self-pay

## 2011-10-13 ENCOUNTER — Encounter (HOSPITAL_COMMUNITY): Payer: Self-pay

## 2011-10-13 DIAGNOSIS — I2510 Atherosclerotic heart disease of native coronary artery without angina pectoris: Secondary | ICD-10-CM | POA: Insufficient documentation

## 2011-10-13 DIAGNOSIS — Z5189 Encounter for other specified aftercare: Secondary | ICD-10-CM | POA: Insufficient documentation

## 2011-10-13 DIAGNOSIS — Z8249 Family history of ischemic heart disease and other diseases of the circulatory system: Secondary | ICD-10-CM | POA: Insufficient documentation

## 2011-10-13 DIAGNOSIS — I10 Essential (primary) hypertension: Secondary | ICD-10-CM | POA: Insufficient documentation

## 2011-10-14 ENCOUNTER — Encounter (HOSPITAL_COMMUNITY): Payer: Self-pay

## 2011-10-18 ENCOUNTER — Encounter (HOSPITAL_COMMUNITY): Payer: Self-pay

## 2011-10-20 ENCOUNTER — Encounter (HOSPITAL_COMMUNITY): Payer: Self-pay

## 2011-10-21 ENCOUNTER — Encounter (HOSPITAL_COMMUNITY): Payer: Self-pay

## 2011-10-25 ENCOUNTER — Encounter (HOSPITAL_COMMUNITY): Payer: Self-pay

## 2011-10-27 ENCOUNTER — Encounter (HOSPITAL_COMMUNITY): Payer: Self-pay

## 2011-10-28 ENCOUNTER — Encounter (HOSPITAL_COMMUNITY)
Admission: RE | Admit: 2011-10-28 | Discharge: 2011-10-28 | Disposition: A | Discharge status: Home / Self Care | Payer: Self-pay | Source: Ambulatory Visit | Attending: Interventional Cardiology | Admitting: Interventional Cardiology

## 2011-11-01 ENCOUNTER — Encounter (HOSPITAL_COMMUNITY)
Admission: RE | Admit: 2011-11-01 | Discharge: 2011-11-01 | Disposition: A | Discharge status: Home / Self Care | Payer: Self-pay | Source: Ambulatory Visit | Attending: Interventional Cardiology | Admitting: Interventional Cardiology

## 2011-11-02 ENCOUNTER — Encounter (HOSPITAL_COMMUNITY)
Admission: RE | Admit: 2011-11-02 | Discharge: 2011-11-02 | Disposition: A | Discharge status: Home / Self Care | Payer: Self-pay | Source: Ambulatory Visit | Attending: Interventional Cardiology | Admitting: Interventional Cardiology

## 2011-11-03 ENCOUNTER — Encounter (HOSPITAL_COMMUNITY): Payer: Self-pay

## 2011-11-04 ENCOUNTER — Encounter (HOSPITAL_COMMUNITY): Payer: Self-pay

## 2011-11-08 ENCOUNTER — Encounter (HOSPITAL_COMMUNITY)
Admission: RE | Admit: 2011-11-08 | Discharge: 2011-11-08 | Disposition: A | Discharge status: Home / Self Care | Payer: Self-pay | Source: Ambulatory Visit | Attending: Interventional Cardiology | Admitting: Interventional Cardiology

## 2011-11-10 ENCOUNTER — Encounter (HOSPITAL_COMMUNITY)
Admission: RE | Admit: 2011-11-10 | Discharge: 2011-11-10 | Disposition: A | Discharge status: Home / Self Care | Payer: Self-pay | Source: Ambulatory Visit | Attending: Interventional Cardiology | Admitting: Interventional Cardiology

## 2011-11-11 ENCOUNTER — Encounter (HOSPITAL_COMMUNITY)
Admission: RE | Admit: 2011-11-11 | Discharge: 2011-11-11 | Disposition: A | Discharge status: Home / Self Care | Payer: Self-pay | Source: Ambulatory Visit | Attending: Interventional Cardiology | Admitting: Interventional Cardiology

## 2011-11-15 ENCOUNTER — Encounter (HOSPITAL_COMMUNITY)
Admission: RE | Admit: 2011-11-15 | Discharge: 2011-11-15 | Disposition: A | Discharge status: Home / Self Care | Payer: Self-pay | Source: Ambulatory Visit | Attending: Interventional Cardiology | Admitting: Interventional Cardiology

## 2011-11-15 DIAGNOSIS — Z8249 Family history of ischemic heart disease and other diseases of the circulatory system: Secondary | ICD-10-CM | POA: Insufficient documentation

## 2011-11-15 DIAGNOSIS — I2510 Atherosclerotic heart disease of native coronary artery without angina pectoris: Secondary | ICD-10-CM | POA: Insufficient documentation

## 2011-11-15 DIAGNOSIS — I10 Essential (primary) hypertension: Secondary | ICD-10-CM | POA: Insufficient documentation

## 2011-11-15 DIAGNOSIS — Z5189 Encounter for other specified aftercare: Secondary | ICD-10-CM | POA: Insufficient documentation

## 2011-11-17 ENCOUNTER — Encounter (HOSPITAL_COMMUNITY)
Admission: RE | Admit: 2011-11-17 | Discharge: 2011-11-17 | Disposition: A | Discharge status: Home / Self Care | Payer: Self-pay | Source: Ambulatory Visit | Attending: Interventional Cardiology | Admitting: Interventional Cardiology

## 2011-11-18 ENCOUNTER — Encounter (HOSPITAL_COMMUNITY)
Admission: RE | Admit: 2011-11-18 | Discharge: 2011-11-18 | Disposition: A | Discharge status: Home / Self Care | Payer: Self-pay | Source: Ambulatory Visit | Attending: Interventional Cardiology | Admitting: Interventional Cardiology

## 2011-11-22 ENCOUNTER — Encounter (HOSPITAL_COMMUNITY)
Admission: RE | Admit: 2011-11-22 | Discharge: 2011-11-22 | Disposition: A | Discharge status: Home / Self Care | Payer: Self-pay | Source: Ambulatory Visit | Attending: Interventional Cardiology | Admitting: Interventional Cardiology

## 2011-11-24 ENCOUNTER — Encounter (HOSPITAL_COMMUNITY)
Admission: RE | Admit: 2011-11-24 | Discharge: 2011-11-24 | Disposition: A | Discharge status: Home / Self Care | Payer: Self-pay | Source: Ambulatory Visit | Attending: Interventional Cardiology | Admitting: Interventional Cardiology

## 2011-11-25 ENCOUNTER — Encounter (HOSPITAL_COMMUNITY)
Admission: RE | Admit: 2011-11-25 | Discharge: 2011-11-25 | Disposition: A | Discharge status: Home / Self Care | Payer: Self-pay | Source: Ambulatory Visit | Attending: Interventional Cardiology | Admitting: Interventional Cardiology

## 2011-11-29 ENCOUNTER — Encounter (HOSPITAL_COMMUNITY)
Admission: RE | Admit: 2011-11-29 | Discharge: 2011-11-29 | Disposition: A | Discharge status: Home / Self Care | Payer: Self-pay | Source: Ambulatory Visit | Attending: Interventional Cardiology | Admitting: Interventional Cardiology

## 2011-12-01 ENCOUNTER — Encounter (HOSPITAL_COMMUNITY)
Admission: RE | Admit: 2011-12-01 | Discharge: 2011-12-01 | Disposition: A | Discharge status: Home / Self Care | Payer: Self-pay | Source: Ambulatory Visit | Attending: Interventional Cardiology | Admitting: Interventional Cardiology

## 2011-12-02 ENCOUNTER — Encounter (HOSPITAL_COMMUNITY)
Admission: RE | Admit: 2011-12-02 | Discharge: 2011-12-02 | Disposition: A | Discharge status: Home / Self Care | Payer: Self-pay | Source: Ambulatory Visit | Attending: Interventional Cardiology | Admitting: Interventional Cardiology

## 2011-12-06 ENCOUNTER — Encounter (HOSPITAL_COMMUNITY): Payer: Self-pay

## 2011-12-08 ENCOUNTER — Encounter (HOSPITAL_COMMUNITY): Payer: Self-pay

## 2011-12-09 ENCOUNTER — Encounter (HOSPITAL_COMMUNITY): Payer: Self-pay

## 2011-12-13 ENCOUNTER — Encounter (HOSPITAL_COMMUNITY): Payer: Self-pay

## 2011-12-15 ENCOUNTER — Encounter (HOSPITAL_COMMUNITY)
Admission: RE | Admit: 2011-12-15 | Discharge: 2011-12-15 | Disposition: A | Discharge status: Home / Self Care | Payer: Self-pay | Source: Ambulatory Visit | Attending: Interventional Cardiology | Admitting: Interventional Cardiology

## 2011-12-15 DIAGNOSIS — Z5189 Encounter for other specified aftercare: Secondary | ICD-10-CM | POA: Insufficient documentation

## 2011-12-15 DIAGNOSIS — I10 Essential (primary) hypertension: Secondary | ICD-10-CM | POA: Insufficient documentation

## 2011-12-15 DIAGNOSIS — I2510 Atherosclerotic heart disease of native coronary artery without angina pectoris: Secondary | ICD-10-CM | POA: Insufficient documentation

## 2011-12-15 DIAGNOSIS — Z8249 Family history of ischemic heart disease and other diseases of the circulatory system: Secondary | ICD-10-CM | POA: Insufficient documentation

## 2011-12-16 ENCOUNTER — Encounter (HOSPITAL_COMMUNITY)
Admission: RE | Admit: 2011-12-16 | Discharge: 2011-12-16 | Disposition: A | Discharge status: Home / Self Care | Payer: Self-pay | Source: Ambulatory Visit | Attending: Interventional Cardiology | Admitting: Interventional Cardiology

## 2011-12-16 DIAGNOSIS — J019 Acute sinusitis, unspecified: Secondary | ICD-10-CM | POA: Diagnosis not present

## 2011-12-20 ENCOUNTER — Encounter (HOSPITAL_COMMUNITY)
Admission: RE | Admit: 2011-12-20 | Discharge: 2011-12-20 | Disposition: A | Discharge status: Home / Self Care | Payer: Self-pay | Source: Ambulatory Visit | Attending: Interventional Cardiology | Admitting: Interventional Cardiology

## 2011-12-21 ENCOUNTER — Encounter (HOSPITAL_COMMUNITY)
Admission: RE | Admit: 2011-12-21 | Discharge: 2011-12-21 | Disposition: A | Discharge status: Home / Self Care | Payer: Self-pay | Source: Ambulatory Visit | Attending: Interventional Cardiology | Admitting: Interventional Cardiology

## 2011-12-22 ENCOUNTER — Encounter (HOSPITAL_COMMUNITY)
Admission: RE | Admit: 2011-12-22 | Discharge: 2011-12-22 | Disposition: A | Discharge status: Home / Self Care | Payer: Self-pay | Source: Ambulatory Visit | Attending: Interventional Cardiology | Admitting: Interventional Cardiology

## 2011-12-23 ENCOUNTER — Encounter (HOSPITAL_COMMUNITY): Payer: Self-pay

## 2011-12-27 ENCOUNTER — Encounter (HOSPITAL_COMMUNITY)
Admission: RE | Admit: 2011-12-27 | Discharge: 2011-12-27 | Disposition: A | Discharge status: Home / Self Care | Payer: Self-pay | Source: Ambulatory Visit | Attending: Interventional Cardiology | Admitting: Interventional Cardiology

## 2011-12-29 ENCOUNTER — Encounter (HOSPITAL_COMMUNITY)
Admission: RE | Admit: 2011-12-29 | Discharge: 2011-12-29 | Disposition: A | Discharge status: Home / Self Care | Payer: Self-pay | Source: Ambulatory Visit | Attending: Interventional Cardiology | Admitting: Interventional Cardiology

## 2011-12-30 ENCOUNTER — Encounter (HOSPITAL_COMMUNITY): Payer: Self-pay

## 2012-01-03 ENCOUNTER — Encounter (HOSPITAL_COMMUNITY)
Admission: RE | Admit: 2012-01-03 | Discharge: 2012-01-03 | Disposition: A | Discharge status: Home / Self Care | Payer: Self-pay | Source: Ambulatory Visit | Attending: Interventional Cardiology | Admitting: Interventional Cardiology

## 2012-01-04 ENCOUNTER — Encounter (HOSPITAL_COMMUNITY)
Admission: RE | Admit: 2012-01-04 | Discharge: 2012-01-04 | Disposition: A | Discharge status: Home / Self Care | Payer: Self-pay | Source: Ambulatory Visit | Attending: Interventional Cardiology | Admitting: Interventional Cardiology

## 2012-01-05 ENCOUNTER — Encounter (HOSPITAL_COMMUNITY)
Admission: RE | Admit: 2012-01-05 | Discharge: 2012-01-05 | Disposition: A | Discharge status: Home / Self Care | Payer: Self-pay | Source: Ambulatory Visit | Attending: Interventional Cardiology | Admitting: Interventional Cardiology

## 2012-01-06 ENCOUNTER — Encounter (HOSPITAL_COMMUNITY): Payer: Self-pay

## 2012-01-10 ENCOUNTER — Encounter (HOSPITAL_COMMUNITY)
Admission: RE | Admit: 2012-01-10 | Discharge: 2012-01-10 | Disposition: A | Discharge status: Home / Self Care | Payer: Self-pay | Source: Ambulatory Visit | Attending: Interventional Cardiology | Admitting: Interventional Cardiology

## 2012-01-10 DIAGNOSIS — C4359 Malignant melanoma of other part of trunk: Secondary | ICD-10-CM | POA: Diagnosis not present

## 2012-01-10 DIAGNOSIS — D239 Other benign neoplasm of skin, unspecified: Secondary | ICD-10-CM | POA: Diagnosis not present

## 2012-01-10 DIAGNOSIS — L820 Inflamed seborrheic keratosis: Secondary | ICD-10-CM | POA: Diagnosis not present

## 2012-01-12 ENCOUNTER — Encounter (HOSPITAL_COMMUNITY)
Admission: RE | Admit: 2012-01-12 | Discharge: 2012-01-12 | Disposition: A | Discharge status: Home / Self Care | Payer: Self-pay | Source: Ambulatory Visit | Attending: Interventional Cardiology | Admitting: Interventional Cardiology

## 2012-01-13 ENCOUNTER — Encounter (HOSPITAL_COMMUNITY)
Admission: RE | Admit: 2012-01-13 | Discharge: 2012-01-13 | Disposition: A | Discharge status: Home / Self Care | Payer: Self-pay | Source: Ambulatory Visit | Attending: Interventional Cardiology | Admitting: Interventional Cardiology

## 2012-01-13 DIAGNOSIS — I2510 Atherosclerotic heart disease of native coronary artery without angina pectoris: Secondary | ICD-10-CM | POA: Insufficient documentation

## 2012-01-13 DIAGNOSIS — Z8249 Family history of ischemic heart disease and other diseases of the circulatory system: Secondary | ICD-10-CM | POA: Insufficient documentation

## 2012-01-13 DIAGNOSIS — Z5189 Encounter for other specified aftercare: Secondary | ICD-10-CM | POA: Insufficient documentation

## 2012-01-13 DIAGNOSIS — I10 Essential (primary) hypertension: Secondary | ICD-10-CM | POA: Insufficient documentation

## 2012-01-16 ENCOUNTER — Encounter (HOSPITAL_COMMUNITY)
Admission: RE | Admit: 2012-01-16 | Discharge: 2012-01-16 | Disposition: A | Discharge status: Home / Self Care | Payer: Self-pay | Source: Ambulatory Visit | Attending: Interventional Cardiology | Admitting: Interventional Cardiology

## 2012-01-16 DIAGNOSIS — C4359 Malignant melanoma of other part of trunk: Secondary | ICD-10-CM | POA: Diagnosis not present

## 2012-01-17 ENCOUNTER — Encounter (HOSPITAL_COMMUNITY): Payer: Self-pay

## 2012-01-19 ENCOUNTER — Encounter (HOSPITAL_COMMUNITY): Payer: Self-pay

## 2012-01-20 ENCOUNTER — Encounter (HOSPITAL_COMMUNITY): Payer: Self-pay

## 2012-01-24 ENCOUNTER — Encounter (HOSPITAL_COMMUNITY): Payer: Self-pay

## 2012-01-26 ENCOUNTER — Encounter (HOSPITAL_COMMUNITY): Payer: Self-pay

## 2012-01-27 ENCOUNTER — Encounter (HOSPITAL_COMMUNITY): Payer: Self-pay

## 2012-01-31 ENCOUNTER — Encounter (HOSPITAL_COMMUNITY)
Admission: RE | Admit: 2012-01-31 | Discharge: 2012-01-31 | Disposition: A | Discharge status: Home / Self Care | Payer: Self-pay | Source: Ambulatory Visit | Attending: Interventional Cardiology | Admitting: Interventional Cardiology

## 2012-02-02 ENCOUNTER — Encounter (HOSPITAL_COMMUNITY)
Admission: RE | Admit: 2012-02-02 | Discharge: 2012-02-02 | Disposition: A | Discharge status: Home / Self Care | Payer: Self-pay | Source: Ambulatory Visit | Attending: Interventional Cardiology | Admitting: Interventional Cardiology

## 2012-02-03 ENCOUNTER — Encounter (HOSPITAL_COMMUNITY)
Admission: RE | Admit: 2012-02-03 | Discharge: 2012-02-03 | Disposition: A | Discharge status: Home / Self Care | Payer: Self-pay | Source: Ambulatory Visit | Attending: Interventional Cardiology | Admitting: Interventional Cardiology

## 2012-02-03 DIAGNOSIS — M545 Low back pain, unspecified: Secondary | ICD-10-CM | POA: Diagnosis not present

## 2012-02-03 DIAGNOSIS — M2550 Pain in unspecified joint: Secondary | ICD-10-CM | POA: Diagnosis not present

## 2012-02-03 DIAGNOSIS — G609 Hereditary and idiopathic neuropathy, unspecified: Secondary | ICD-10-CM | POA: Diagnosis not present

## 2012-02-03 DIAGNOSIS — H04129 Dry eye syndrome of unspecified lacrimal gland: Secondary | ICD-10-CM | POA: Diagnosis not present

## 2012-02-03 DIAGNOSIS — M1990 Unspecified osteoarthritis, unspecified site: Secondary | ICD-10-CM | POA: Diagnosis not present

## 2012-02-07 ENCOUNTER — Encounter (HOSPITAL_COMMUNITY): Payer: Self-pay

## 2012-02-09 ENCOUNTER — Encounter (HOSPITAL_COMMUNITY): Payer: Self-pay

## 2012-02-10 ENCOUNTER — Encounter (HOSPITAL_COMMUNITY): Payer: Self-pay

## 2012-02-10 DIAGNOSIS — Z5189 Encounter for other specified aftercare: Secondary | ICD-10-CM | POA: Insufficient documentation

## 2012-02-10 DIAGNOSIS — I10 Essential (primary) hypertension: Secondary | ICD-10-CM | POA: Insufficient documentation

## 2012-02-10 DIAGNOSIS — Z8249 Family history of ischemic heart disease and other diseases of the circulatory system: Secondary | ICD-10-CM | POA: Insufficient documentation

## 2012-02-10 DIAGNOSIS — I2510 Atherosclerotic heart disease of native coronary artery without angina pectoris: Secondary | ICD-10-CM | POA: Insufficient documentation

## 2012-02-14 ENCOUNTER — Encounter (HOSPITAL_COMMUNITY)
Admission: RE | Admit: 2012-02-14 | Discharge: 2012-02-14 | Disposition: A | Discharge status: Home / Self Care | Payer: Self-pay | Source: Ambulatory Visit | Attending: Interventional Cardiology | Admitting: Interventional Cardiology

## 2012-02-16 ENCOUNTER — Encounter (HOSPITAL_COMMUNITY): Payer: Self-pay

## 2012-02-17 ENCOUNTER — Encounter (HOSPITAL_COMMUNITY)
Admission: RE | Admit: 2012-02-17 | Discharge: 2012-02-17 | Disposition: A | Discharge status: Home / Self Care | Payer: Self-pay | Source: Ambulatory Visit | Attending: Interventional Cardiology | Admitting: Interventional Cardiology

## 2012-02-21 ENCOUNTER — Encounter (HOSPITAL_COMMUNITY)
Admission: RE | Admit: 2012-02-21 | Discharge: 2012-02-21 | Disposition: A | Discharge status: Home / Self Care | Payer: Self-pay | Source: Ambulatory Visit | Attending: Interventional Cardiology | Admitting: Interventional Cardiology

## 2012-02-23 ENCOUNTER — Encounter (HOSPITAL_COMMUNITY)
Admission: RE | Admit: 2012-02-23 | Discharge: 2012-02-23 | Disposition: A | Discharge status: Home / Self Care | Payer: Self-pay | Source: Ambulatory Visit | Attending: Interventional Cardiology | Admitting: Interventional Cardiology

## 2012-02-24 ENCOUNTER — Encounter (HOSPITAL_COMMUNITY)
Admission: RE | Admit: 2012-02-24 | Discharge: 2012-02-24 | Disposition: A | Discharge status: Home / Self Care | Payer: Self-pay | Source: Ambulatory Visit | Attending: Interventional Cardiology | Admitting: Interventional Cardiology

## 2012-02-28 ENCOUNTER — Encounter (HOSPITAL_COMMUNITY)
Admission: RE | Admit: 2012-02-28 | Discharge: 2012-02-28 | Disposition: A | Discharge status: Home / Self Care | Payer: Self-pay | Source: Ambulatory Visit | Attending: Interventional Cardiology | Admitting: Interventional Cardiology

## 2012-03-01 ENCOUNTER — Encounter (HOSPITAL_COMMUNITY)
Admission: RE | Admit: 2012-03-01 | Discharge: 2012-03-01 | Disposition: A | Discharge status: Home / Self Care | Payer: Self-pay | Source: Ambulatory Visit | Attending: Interventional Cardiology | Admitting: Interventional Cardiology

## 2012-03-01 DIAGNOSIS — M2550 Pain in unspecified joint: Secondary | ICD-10-CM | POA: Diagnosis not present

## 2012-03-01 DIAGNOSIS — M4800 Spinal stenosis, site unspecified: Secondary | ICD-10-CM | POA: Diagnosis not present

## 2012-03-01 DIAGNOSIS — R21 Rash and other nonspecific skin eruption: Secondary | ICD-10-CM | POA: Diagnosis not present

## 2012-03-02 ENCOUNTER — Encounter (HOSPITAL_COMMUNITY)
Admission: RE | Admit: 2012-03-02 | Discharge: 2012-03-02 | Disposition: A | Discharge status: Home / Self Care | Payer: Self-pay | Source: Ambulatory Visit | Attending: Interventional Cardiology | Admitting: Interventional Cardiology

## 2012-03-02 NOTE — Progress Notes (Signed)
Nathan [name] presented to exercise class on 03-01-12 with a rash on the inner aspect of his left forearm. The rash was only in this area. Nathan [name] has had a history of prior rash breakouts that were suspected to be related to medications. He was instructed to follow up with his physician. Today he presents with the rash having spread to the right forearm and slightly upper left upper arm as well as the left lower arm. He said he did contact his primary physician yesterday and will have a follow up today with his Dermatologist. He was able to exercise without any issues. He is not taking his bystolic and blood pressure was followed during exercise today. Will continue to follow.

## 2012-03-06 ENCOUNTER — Encounter (HOSPITAL_COMMUNITY)
Admission: RE | Admit: 2012-03-06 | Discharge: 2012-03-06 | Disposition: A | Discharge status: Home / Self Care | Payer: Self-pay | Source: Ambulatory Visit | Attending: Interventional Cardiology | Admitting: Interventional Cardiology

## 2012-03-08 ENCOUNTER — Encounter (HOSPITAL_COMMUNITY): Payer: Self-pay

## 2012-03-09 ENCOUNTER — Encounter (HOSPITAL_COMMUNITY)
Admission: RE | Admit: 2012-03-09 | Discharge: 2012-03-09 | Disposition: A | Discharge status: Home / Self Care | Payer: Self-pay | Source: Ambulatory Visit | Attending: Interventional Cardiology | Admitting: Interventional Cardiology

## 2012-03-13 ENCOUNTER — Encounter (HOSPITAL_COMMUNITY)
Admission: RE | Admit: 2012-03-13 | Discharge: 2012-03-13 | Disposition: A | Discharge status: Home / Self Care | Payer: Self-pay | Source: Ambulatory Visit | Attending: Interventional Cardiology | Admitting: Interventional Cardiology

## 2012-03-13 DIAGNOSIS — Z8249 Family history of ischemic heart disease and other diseases of the circulatory system: Secondary | ICD-10-CM | POA: Insufficient documentation

## 2012-03-13 DIAGNOSIS — I10 Essential (primary) hypertension: Secondary | ICD-10-CM | POA: Insufficient documentation

## 2012-03-13 DIAGNOSIS — I2510 Atherosclerotic heart disease of native coronary artery without angina pectoris: Secondary | ICD-10-CM | POA: Insufficient documentation

## 2012-03-13 DIAGNOSIS — Z5189 Encounter for other specified aftercare: Secondary | ICD-10-CM | POA: Insufficient documentation

## 2012-03-15 ENCOUNTER — Encounter (HOSPITAL_COMMUNITY)
Admission: RE | Admit: 2012-03-15 | Discharge: 2012-03-15 | Disposition: A | Discharge status: Home / Self Care | Payer: Self-pay | Source: Ambulatory Visit | Attending: Interventional Cardiology | Admitting: Interventional Cardiology

## 2012-03-16 ENCOUNTER — Encounter (HOSPITAL_COMMUNITY)
Admission: RE | Admit: 2012-03-16 | Discharge: 2012-03-16 | Disposition: A | Discharge status: Home / Self Care | Payer: Self-pay | Source: Ambulatory Visit | Attending: Interventional Cardiology | Admitting: Interventional Cardiology

## 2012-03-20 ENCOUNTER — Encounter (HOSPITAL_COMMUNITY)
Admission: RE | Admit: 2012-03-20 | Discharge: 2012-03-20 | Disposition: A | Discharge status: Home / Self Care | Payer: Self-pay | Source: Ambulatory Visit | Attending: Interventional Cardiology | Admitting: Interventional Cardiology

## 2012-03-22 ENCOUNTER — Encounter (HOSPITAL_COMMUNITY)
Admission: RE | Admit: 2012-03-22 | Discharge: 2012-03-22 | Disposition: A | Discharge status: Home / Self Care | Payer: Self-pay | Source: Ambulatory Visit | Attending: Interventional Cardiology | Admitting: Interventional Cardiology

## 2012-03-23 ENCOUNTER — Encounter (HOSPITAL_COMMUNITY)
Admission: RE | Admit: 2012-03-23 | Discharge: 2012-03-23 | Disposition: A | Discharge status: Home / Self Care | Payer: Self-pay | Source: Ambulatory Visit | Attending: Interventional Cardiology | Admitting: Interventional Cardiology

## 2012-03-27 ENCOUNTER — Encounter (HOSPITAL_COMMUNITY)
Admission: RE | Admit: 2012-03-27 | Discharge: 2012-03-27 | Disposition: A | Discharge status: Home / Self Care | Payer: Self-pay | Source: Ambulatory Visit | Attending: Interventional Cardiology | Admitting: Interventional Cardiology

## 2012-03-28 ENCOUNTER — Encounter (HOSPITAL_COMMUNITY)
Admission: RE | Admit: 2012-03-28 | Discharge: 2012-03-28 | Disposition: A | Discharge status: Home / Self Care | Payer: Self-pay | Source: Ambulatory Visit | Attending: Interventional Cardiology | Admitting: Interventional Cardiology

## 2012-03-29 ENCOUNTER — Encounter (HOSPITAL_COMMUNITY)
Admission: RE | Admit: 2012-03-29 | Discharge: 2012-03-29 | Disposition: A | Discharge status: Home / Self Care | Payer: Self-pay | Source: Ambulatory Visit | Attending: Interventional Cardiology | Admitting: Interventional Cardiology

## 2012-03-30 ENCOUNTER — Encounter (HOSPITAL_COMMUNITY): Payer: Self-pay

## 2012-04-03 ENCOUNTER — Encounter (HOSPITAL_COMMUNITY)
Admission: RE | Admit: 2012-04-03 | Discharge: 2012-04-03 | Disposition: A | Discharge status: Home / Self Care | Payer: Self-pay | Source: Ambulatory Visit | Attending: Interventional Cardiology | Admitting: Interventional Cardiology

## 2012-04-05 ENCOUNTER — Encounter (HOSPITAL_COMMUNITY)
Admission: RE | Admit: 2012-04-05 | Discharge: 2012-04-05 | Disposition: A | Discharge status: Home / Self Care | Payer: Self-pay | Source: Ambulatory Visit | Attending: Interventional Cardiology | Admitting: Interventional Cardiology

## 2012-04-06 ENCOUNTER — Encounter (HOSPITAL_COMMUNITY)
Admission: RE | Admit: 2012-04-06 | Discharge: 2012-04-06 | Disposition: A | Discharge status: Home / Self Care | Payer: Self-pay | Source: Ambulatory Visit | Attending: Interventional Cardiology | Admitting: Interventional Cardiology

## 2012-04-06 DIAGNOSIS — D3130 Benign neoplasm of unspecified choroid: Secondary | ICD-10-CM | POA: Diagnosis not present

## 2012-04-06 DIAGNOSIS — H2510 Age-related nuclear cataract, unspecified eye: Secondary | ICD-10-CM | POA: Insufficient documentation

## 2012-04-09 DIAGNOSIS — I2510 Atherosclerotic heart disease of native coronary artery without angina pectoris: Secondary | ICD-10-CM | POA: Diagnosis not present

## 2012-04-10 ENCOUNTER — Encounter (HOSPITAL_COMMUNITY)
Admission: RE | Admit: 2012-04-10 | Discharge: 2012-04-10 | Disposition: A | Discharge status: Home / Self Care | Payer: Self-pay | Source: Ambulatory Visit | Attending: Interventional Cardiology | Admitting: Interventional Cardiology

## 2012-04-12 ENCOUNTER — Encounter (HOSPITAL_COMMUNITY)
Admission: RE | Admit: 2012-04-12 | Discharge: 2012-04-12 | Disposition: A | Discharge status: Home / Self Care | Payer: Self-pay | Source: Ambulatory Visit | Attending: Interventional Cardiology | Admitting: Interventional Cardiology

## 2012-04-12 DIAGNOSIS — Z8249 Family history of ischemic heart disease and other diseases of the circulatory system: Secondary | ICD-10-CM | POA: Insufficient documentation

## 2012-04-12 DIAGNOSIS — Z5189 Encounter for other specified aftercare: Secondary | ICD-10-CM | POA: Insufficient documentation

## 2012-04-12 DIAGNOSIS — I2510 Atherosclerotic heart disease of native coronary artery without angina pectoris: Secondary | ICD-10-CM | POA: Insufficient documentation

## 2012-04-12 DIAGNOSIS — I10 Essential (primary) hypertension: Secondary | ICD-10-CM | POA: Insufficient documentation

## 2012-04-13 ENCOUNTER — Encounter (HOSPITAL_COMMUNITY)
Admission: RE | Admit: 2012-04-13 | Discharge: 2012-04-13 | Disposition: A | Discharge status: Home / Self Care | Payer: Self-pay | Source: Ambulatory Visit | Attending: Interventional Cardiology | Admitting: Interventional Cardiology

## 2012-04-17 ENCOUNTER — Encounter (HOSPITAL_COMMUNITY)
Admission: RE | Admit: 2012-04-17 | Discharge: 2012-04-17 | Disposition: A | Discharge status: Home / Self Care | Payer: Self-pay | Source: Ambulatory Visit | Attending: Interventional Cardiology | Admitting: Interventional Cardiology

## 2012-04-19 ENCOUNTER — Encounter (HOSPITAL_COMMUNITY)
Admission: RE | Admit: 2012-04-19 | Discharge: 2012-04-19 | Disposition: A | Discharge status: Home / Self Care | Payer: Self-pay | Source: Ambulatory Visit | Attending: Interventional Cardiology | Admitting: Interventional Cardiology

## 2012-04-20 ENCOUNTER — Encounter (HOSPITAL_COMMUNITY)
Admission: RE | Admit: 2012-04-20 | Discharge: 2012-04-20 | Disposition: A | Discharge status: Home / Self Care | Payer: Self-pay | Source: Ambulatory Visit | Attending: Interventional Cardiology | Admitting: Interventional Cardiology

## 2012-04-24 ENCOUNTER — Encounter (HOSPITAL_COMMUNITY)
Admission: RE | Admit: 2012-04-24 | Discharge: 2012-04-24 | Disposition: A | Discharge status: Home / Self Care | Payer: Self-pay | Source: Ambulatory Visit | Attending: Interventional Cardiology | Admitting: Interventional Cardiology

## 2012-04-26 ENCOUNTER — Encounter (HOSPITAL_COMMUNITY)
Admission: RE | Admit: 2012-04-26 | Discharge: 2012-04-26 | Disposition: A | Discharge status: Home / Self Care | Payer: Self-pay | Source: Ambulatory Visit | Attending: Interventional Cardiology | Admitting: Interventional Cardiology

## 2012-04-27 ENCOUNTER — Encounter (HOSPITAL_COMMUNITY)
Admission: RE | Admit: 2012-04-27 | Discharge: 2012-04-27 | Disposition: A | Discharge status: Home / Self Care | Payer: Self-pay | Source: Ambulatory Visit | Attending: Interventional Cardiology | Admitting: Interventional Cardiology

## 2012-04-27 DIAGNOSIS — D239 Other benign neoplasm of skin, unspecified: Secondary | ICD-10-CM | POA: Diagnosis not present

## 2012-04-27 DIAGNOSIS — L570 Actinic keratosis: Secondary | ICD-10-CM | POA: Diagnosis not present

## 2012-04-27 DIAGNOSIS — D485 Neoplasm of uncertain behavior of skin: Secondary | ICD-10-CM | POA: Diagnosis not present

## 2012-04-27 DIAGNOSIS — L821 Other seborrheic keratosis: Secondary | ICD-10-CM | POA: Diagnosis not present

## 2012-05-01 ENCOUNTER — Encounter (HOSPITAL_COMMUNITY): Payer: Self-pay

## 2012-05-03 ENCOUNTER — Encounter (HOSPITAL_COMMUNITY)
Admission: RE | Admit: 2012-05-03 | Discharge: 2012-05-03 | Disposition: A | Discharge status: Home / Self Care | Payer: Self-pay | Source: Ambulatory Visit | Attending: Interventional Cardiology | Admitting: Interventional Cardiology

## 2012-05-04 ENCOUNTER — Encounter (HOSPITAL_COMMUNITY)
Admission: RE | Admit: 2012-05-04 | Discharge: 2012-05-04 | Disposition: A | Discharge status: Home / Self Care | Payer: Self-pay | Source: Ambulatory Visit | Attending: Interventional Cardiology | Admitting: Interventional Cardiology

## 2012-05-08 ENCOUNTER — Encounter (HOSPITAL_COMMUNITY)
Admission: RE | Admit: 2012-05-08 | Discharge: 2012-05-08 | Disposition: A | Discharge status: Home / Self Care | Payer: Self-pay | Source: Ambulatory Visit | Attending: Interventional Cardiology | Admitting: Interventional Cardiology

## 2012-05-10 ENCOUNTER — Encounter (HOSPITAL_COMMUNITY)
Admission: RE | Admit: 2012-05-10 | Discharge: 2012-05-10 | Disposition: A | Discharge status: Home / Self Care | Payer: Self-pay | Source: Ambulatory Visit | Attending: Interventional Cardiology | Admitting: Interventional Cardiology

## 2012-05-11 ENCOUNTER — Encounter (HOSPITAL_COMMUNITY)
Admission: RE | Admit: 2012-05-11 | Discharge: 2012-05-11 | Disposition: A | Discharge status: Home / Self Care | Payer: Self-pay | Source: Ambulatory Visit | Attending: Interventional Cardiology | Admitting: Interventional Cardiology

## 2012-05-15 ENCOUNTER — Encounter (HOSPITAL_COMMUNITY)
Admission: RE | Admit: 2012-05-15 | Discharge: 2012-05-15 | Disposition: A | Discharge status: Home / Self Care | Payer: Self-pay | Source: Ambulatory Visit | Attending: Interventional Cardiology | Admitting: Interventional Cardiology

## 2012-05-15 DIAGNOSIS — Z8249 Family history of ischemic heart disease and other diseases of the circulatory system: Secondary | ICD-10-CM | POA: Insufficient documentation

## 2012-05-15 DIAGNOSIS — Z5189 Encounter for other specified aftercare: Secondary | ICD-10-CM | POA: Insufficient documentation

## 2012-05-15 DIAGNOSIS — I2510 Atherosclerotic heart disease of native coronary artery without angina pectoris: Secondary | ICD-10-CM | POA: Insufficient documentation

## 2012-05-15 DIAGNOSIS — I10 Essential (primary) hypertension: Secondary | ICD-10-CM | POA: Insufficient documentation

## 2012-05-17 ENCOUNTER — Encounter (HOSPITAL_COMMUNITY)
Admission: RE | Admit: 2012-05-17 | Discharge: 2012-05-17 | Disposition: A | Discharge status: Home / Self Care | Payer: Self-pay | Source: Ambulatory Visit | Attending: Interventional Cardiology | Admitting: Interventional Cardiology

## 2012-05-18 ENCOUNTER — Encounter (HOSPITAL_COMMUNITY)
Admission: RE | Admit: 2012-05-18 | Discharge: 2012-05-18 | Disposition: A | Discharge status: Home / Self Care | Payer: Self-pay | Source: Ambulatory Visit | Attending: Interventional Cardiology | Admitting: Interventional Cardiology

## 2012-05-21 ENCOUNTER — Encounter (HOSPITAL_COMMUNITY)
Admission: RE | Admit: 2012-05-21 | Discharge: 2012-05-21 | Disposition: A | Discharge status: Home / Self Care | Payer: Self-pay | Source: Ambulatory Visit | Attending: Interventional Cardiology | Admitting: Interventional Cardiology

## 2012-05-22 ENCOUNTER — Encounter (HOSPITAL_COMMUNITY)
Admission: RE | Admit: 2012-05-22 | Discharge: 2012-05-22 | Disposition: A | Discharge status: Home / Self Care | Payer: Self-pay | Source: Ambulatory Visit | Attending: Interventional Cardiology | Admitting: Interventional Cardiology

## 2012-05-24 ENCOUNTER — Encounter (HOSPITAL_COMMUNITY)
Admission: RE | Admit: 2012-05-24 | Discharge: 2012-05-24 | Disposition: A | Discharge status: Home / Self Care | Payer: Self-pay | Source: Ambulatory Visit | Attending: Interventional Cardiology | Admitting: Interventional Cardiology

## 2012-05-25 ENCOUNTER — Encounter (HOSPITAL_COMMUNITY): Payer: Self-pay

## 2012-05-29 ENCOUNTER — Encounter (HOSPITAL_COMMUNITY)
Admission: RE | Admit: 2012-05-29 | Discharge: 2012-05-29 | Disposition: A | Discharge status: Home / Self Care | Payer: Self-pay | Source: Ambulatory Visit | Attending: Interventional Cardiology | Admitting: Interventional Cardiology

## 2012-05-30 DIAGNOSIS — E7800 Pure hypercholesterolemia, unspecified: Secondary | ICD-10-CM | POA: Diagnosis not present

## 2012-05-30 DIAGNOSIS — I2510 Atherosclerotic heart disease of native coronary artery without angina pectoris: Secondary | ICD-10-CM | POA: Diagnosis not present

## 2012-05-30 DIAGNOSIS — I10 Essential (primary) hypertension: Secondary | ICD-10-CM | POA: Diagnosis not present

## 2012-05-31 ENCOUNTER — Encounter (HOSPITAL_COMMUNITY)
Admission: RE | Admit: 2012-05-31 | Discharge: 2012-05-31 | Disposition: A | Discharge status: Home / Self Care | Payer: Self-pay | Source: Ambulatory Visit | Attending: Interventional Cardiology | Admitting: Interventional Cardiology

## 2012-06-01 ENCOUNTER — Encounter (HOSPITAL_COMMUNITY)
Admission: RE | Admit: 2012-06-01 | Discharge: 2012-06-01 | Disposition: A | Discharge status: Home / Self Care | Payer: Self-pay | Source: Ambulatory Visit | Attending: Interventional Cardiology | Admitting: Interventional Cardiology

## 2012-06-05 ENCOUNTER — Encounter (HOSPITAL_COMMUNITY)
Admission: RE | Admit: 2012-06-05 | Discharge: 2012-06-05 | Disposition: A | Discharge status: Home / Self Care | Payer: Self-pay | Source: Ambulatory Visit | Attending: Interventional Cardiology | Admitting: Interventional Cardiology

## 2012-06-07 ENCOUNTER — Encounter (HOSPITAL_COMMUNITY)
Admission: RE | Admit: 2012-06-07 | Discharge: 2012-06-07 | Disposition: A | Discharge status: Home / Self Care | Payer: Self-pay | Source: Ambulatory Visit | Attending: Interventional Cardiology | Admitting: Interventional Cardiology

## 2012-06-08 ENCOUNTER — Encounter (HOSPITAL_COMMUNITY)
Admission: RE | Admit: 2012-06-08 | Discharge: 2012-06-08 | Disposition: A | Discharge status: Home / Self Care | Payer: Self-pay | Source: Ambulatory Visit | Attending: Interventional Cardiology | Admitting: Interventional Cardiology

## 2012-06-11 ENCOUNTER — Encounter (HOSPITAL_COMMUNITY)
Admission: RE | Admit: 2012-06-11 | Discharge: 2012-06-11 | Disposition: A | Discharge status: Home / Self Care | Payer: Self-pay | Source: Ambulatory Visit | Attending: Interventional Cardiology | Admitting: Interventional Cardiology

## 2012-06-11 DIAGNOSIS — Z8249 Family history of ischemic heart disease and other diseases of the circulatory system: Secondary | ICD-10-CM | POA: Insufficient documentation

## 2012-06-11 DIAGNOSIS — I2510 Atherosclerotic heart disease of native coronary artery without angina pectoris: Secondary | ICD-10-CM | POA: Insufficient documentation

## 2012-06-11 DIAGNOSIS — I10 Essential (primary) hypertension: Secondary | ICD-10-CM | POA: Insufficient documentation

## 2012-06-11 DIAGNOSIS — Z5189 Encounter for other specified aftercare: Secondary | ICD-10-CM | POA: Insufficient documentation

## 2012-06-11 DIAGNOSIS — E7800 Pure hypercholesterolemia, unspecified: Secondary | ICD-10-CM | POA: Diagnosis not present

## 2012-06-12 ENCOUNTER — Encounter (HOSPITAL_COMMUNITY)
Admission: RE | Admit: 2012-06-12 | Discharge: 2012-06-12 | Disposition: A | Discharge status: Home / Self Care | Payer: Self-pay | Source: Ambulatory Visit | Attending: Interventional Cardiology | Admitting: Interventional Cardiology

## 2012-06-14 ENCOUNTER — Encounter (HOSPITAL_COMMUNITY): Payer: Self-pay

## 2012-06-15 ENCOUNTER — Encounter (HOSPITAL_COMMUNITY): Payer: Self-pay

## 2012-06-19 ENCOUNTER — Encounter (HOSPITAL_COMMUNITY)
Admission: RE | Admit: 2012-06-19 | Discharge: 2012-06-19 | Disposition: A | Discharge status: Home / Self Care | Payer: Self-pay | Source: Ambulatory Visit | Attending: Interventional Cardiology | Admitting: Interventional Cardiology

## 2012-06-20 ENCOUNTER — Encounter (HOSPITAL_COMMUNITY)
Admission: RE | Admit: 2012-06-20 | Discharge: 2012-06-20 | Disposition: A | Discharge status: Home / Self Care | Payer: Self-pay | Source: Ambulatory Visit | Attending: Interventional Cardiology | Admitting: Interventional Cardiology

## 2012-06-21 ENCOUNTER — Encounter (HOSPITAL_COMMUNITY): Payer: Self-pay

## 2012-06-22 ENCOUNTER — Encounter (HOSPITAL_COMMUNITY): Payer: Self-pay

## 2012-06-26 ENCOUNTER — Encounter (HOSPITAL_COMMUNITY)
Admission: RE | Admit: 2012-06-26 | Discharge: 2012-06-26 | Disposition: A | Discharge status: Home / Self Care | Payer: Self-pay | Source: Ambulatory Visit | Attending: Interventional Cardiology | Admitting: Interventional Cardiology

## 2012-06-28 ENCOUNTER — Encounter (HOSPITAL_COMMUNITY)
Admission: RE | Admit: 2012-06-28 | Discharge: 2012-06-28 | Disposition: A | Discharge status: Home / Self Care | Payer: Self-pay | Source: Ambulatory Visit | Attending: Interventional Cardiology | Admitting: Interventional Cardiology

## 2012-06-29 ENCOUNTER — Encounter (HOSPITAL_COMMUNITY)
Admission: RE | Admit: 2012-06-29 | Discharge: 2012-06-29 | Disposition: A | Discharge status: Home / Self Care | Payer: Self-pay | Source: Ambulatory Visit | Attending: Interventional Cardiology | Admitting: Interventional Cardiology

## 2012-07-03 ENCOUNTER — Encounter (HOSPITAL_COMMUNITY): Payer: Self-pay

## 2012-07-05 ENCOUNTER — Encounter (HOSPITAL_COMMUNITY): Payer: Self-pay

## 2012-07-06 ENCOUNTER — Encounter (HOSPITAL_COMMUNITY): Payer: Self-pay

## 2012-07-10 ENCOUNTER — Encounter (HOSPITAL_COMMUNITY): Payer: Self-pay

## 2012-07-12 ENCOUNTER — Encounter (HOSPITAL_COMMUNITY): Payer: Self-pay

## 2012-07-12 DIAGNOSIS — Z5189 Encounter for other specified aftercare: Secondary | ICD-10-CM | POA: Insufficient documentation

## 2012-07-12 DIAGNOSIS — I2510 Atherosclerotic heart disease of native coronary artery without angina pectoris: Secondary | ICD-10-CM | POA: Insufficient documentation

## 2012-07-12 DIAGNOSIS — Z8249 Family history of ischemic heart disease and other diseases of the circulatory system: Secondary | ICD-10-CM | POA: Insufficient documentation

## 2012-07-12 DIAGNOSIS — I10 Essential (primary) hypertension: Secondary | ICD-10-CM | POA: Insufficient documentation

## 2012-07-13 ENCOUNTER — Encounter (HOSPITAL_COMMUNITY): Payer: Self-pay

## 2012-07-17 ENCOUNTER — Encounter (HOSPITAL_COMMUNITY): Payer: Self-pay

## 2012-07-19 ENCOUNTER — Encounter (HOSPITAL_COMMUNITY): Payer: Self-pay

## 2012-07-20 ENCOUNTER — Encounter (HOSPITAL_COMMUNITY): Payer: Self-pay

## 2012-07-24 ENCOUNTER — Encounter (HOSPITAL_COMMUNITY)
Admission: RE | Admit: 2012-07-24 | Discharge: 2012-07-24 | Disposition: A | Discharge status: Home / Self Care | Payer: Self-pay | Source: Ambulatory Visit | Attending: Interventional Cardiology | Admitting: Interventional Cardiology

## 2012-07-26 ENCOUNTER — Encounter (HOSPITAL_COMMUNITY)
Admission: RE | Admit: 2012-07-26 | Discharge: 2012-07-26 | Disposition: A | Discharge status: Home / Self Care | Payer: Self-pay | Source: Ambulatory Visit | Attending: Interventional Cardiology | Admitting: Interventional Cardiology

## 2012-07-27 ENCOUNTER — Encounter (HOSPITAL_COMMUNITY)
Admission: RE | Admit: 2012-07-27 | Discharge: 2012-07-27 | Disposition: A | Discharge status: Home / Self Care | Payer: Self-pay | Source: Ambulatory Visit | Attending: Interventional Cardiology | Admitting: Interventional Cardiology

## 2012-07-30 DIAGNOSIS — Z0000 Encounter for general adult medical examination without abnormal findings: Secondary | ICD-10-CM | POA: Diagnosis not present

## 2012-07-31 ENCOUNTER — Encounter (HOSPITAL_COMMUNITY)
Admission: RE | Admit: 2012-07-31 | Discharge: 2012-07-31 | Disposition: A | Discharge status: Home / Self Care | Payer: Self-pay | Source: Ambulatory Visit | Attending: Interventional Cardiology | Admitting: Interventional Cardiology

## 2012-08-01 ENCOUNTER — Encounter (HOSPITAL_COMMUNITY)
Admission: RE | Admit: 2012-08-01 | Discharge: 2012-08-01 | Disposition: A | Discharge status: Home / Self Care | Payer: Self-pay | Source: Ambulatory Visit | Attending: Interventional Cardiology | Admitting: Interventional Cardiology

## 2012-08-02 ENCOUNTER — Encounter (HOSPITAL_COMMUNITY): Payer: Self-pay

## 2012-08-03 ENCOUNTER — Encounter (HOSPITAL_COMMUNITY): Payer: Self-pay

## 2012-08-07 ENCOUNTER — Encounter (HOSPITAL_COMMUNITY)
Admission: RE | Admit: 2012-08-07 | Discharge: 2012-08-07 | Disposition: A | Discharge status: Home / Self Care | Payer: Self-pay | Source: Ambulatory Visit | Attending: Interventional Cardiology | Admitting: Interventional Cardiology

## 2012-08-09 ENCOUNTER — Encounter (HOSPITAL_COMMUNITY)
Admission: RE | Admit: 2012-08-09 | Discharge: 2012-08-09 | Disposition: A | Discharge status: Home / Self Care | Payer: Self-pay | Source: Ambulatory Visit | Attending: Interventional Cardiology | Admitting: Interventional Cardiology

## 2012-08-10 ENCOUNTER — Encounter (HOSPITAL_COMMUNITY)
Admission: RE | Admit: 2012-08-10 | Discharge: 2012-08-10 | Disposition: A | Discharge status: Home / Self Care | Payer: Self-pay | Source: Ambulatory Visit | Attending: Interventional Cardiology | Admitting: Interventional Cardiology

## 2012-08-14 ENCOUNTER — Encounter (HOSPITAL_COMMUNITY): Payer: Self-pay

## 2012-08-14 DIAGNOSIS — I2510 Atherosclerotic heart disease of native coronary artery without angina pectoris: Secondary | ICD-10-CM | POA: Insufficient documentation

## 2012-08-14 DIAGNOSIS — Z8249 Family history of ischemic heart disease and other diseases of the circulatory system: Secondary | ICD-10-CM | POA: Insufficient documentation

## 2012-08-14 DIAGNOSIS — Z5189 Encounter for other specified aftercare: Secondary | ICD-10-CM | POA: Insufficient documentation

## 2012-08-14 DIAGNOSIS — I10 Essential (primary) hypertension: Secondary | ICD-10-CM | POA: Insufficient documentation

## 2012-08-16 ENCOUNTER — Encounter (HOSPITAL_COMMUNITY)
Admission: RE | Admit: 2012-08-16 | Discharge: 2012-08-16 | Disposition: A | Discharge status: Home / Self Care | Payer: Self-pay | Source: Ambulatory Visit | Attending: Interventional Cardiology | Admitting: Interventional Cardiology

## 2012-08-17 ENCOUNTER — Encounter (HOSPITAL_COMMUNITY)
Admission: RE | Admit: 2012-08-17 | Discharge: 2012-08-17 | Disposition: A | Discharge status: Home / Self Care | Payer: Self-pay | Source: Ambulatory Visit | Attending: Interventional Cardiology | Admitting: Interventional Cardiology

## 2012-08-21 ENCOUNTER — Encounter (HOSPITAL_COMMUNITY)
Admission: RE | Admit: 2012-08-21 | Discharge: 2012-08-21 | Disposition: A | Discharge status: Home / Self Care | Payer: Self-pay | Source: Ambulatory Visit | Attending: Interventional Cardiology | Admitting: Interventional Cardiology

## 2012-08-23 ENCOUNTER — Encounter (HOSPITAL_COMMUNITY)
Admission: RE | Admit: 2012-08-23 | Discharge: 2012-08-23 | Disposition: A | Discharge status: Home / Self Care | Payer: Self-pay | Source: Ambulatory Visit | Attending: Interventional Cardiology | Admitting: Interventional Cardiology

## 2012-08-23 DIAGNOSIS — D3130 Benign neoplasm of unspecified choroid: Secondary | ICD-10-CM | POA: Diagnosis not present

## 2012-08-23 DIAGNOSIS — H43819 Vitreous degeneration, unspecified eye: Secondary | ICD-10-CM | POA: Insufficient documentation

## 2012-08-23 DIAGNOSIS — H33009 Unspecified retinal detachment with retinal break, unspecified eye: Secondary | ICD-10-CM | POA: Diagnosis not present

## 2012-08-23 DIAGNOSIS — H2510 Age-related nuclear cataract, unspecified eye: Secondary | ICD-10-CM | POA: Diagnosis not present

## 2012-08-23 DIAGNOSIS — H33319 Horseshoe tear of retina without detachment, unspecified eye: Secondary | ICD-10-CM | POA: Insufficient documentation

## 2012-08-24 ENCOUNTER — Encounter (HOSPITAL_COMMUNITY)
Admission: RE | Admit: 2012-08-24 | Discharge: 2012-08-24 | Disposition: A | Discharge status: Home / Self Care | Payer: Self-pay | Source: Ambulatory Visit | Attending: Interventional Cardiology | Admitting: Interventional Cardiology

## 2012-08-27 ENCOUNTER — Encounter (HOSPITAL_COMMUNITY)
Admission: RE | Admit: 2012-08-27 | Discharge: 2012-08-27 | Disposition: A | Discharge status: Home / Self Care | Payer: Self-pay | Source: Ambulatory Visit | Attending: Interventional Cardiology | Admitting: Interventional Cardiology

## 2012-08-28 ENCOUNTER — Encounter (HOSPITAL_COMMUNITY)
Admission: RE | Admit: 2012-08-28 | Discharge: 2012-08-28 | Disposition: A | Discharge status: Home / Self Care | Payer: Self-pay | Source: Ambulatory Visit | Attending: Interventional Cardiology | Admitting: Interventional Cardiology

## 2012-08-30 ENCOUNTER — Encounter (HOSPITAL_COMMUNITY): Payer: Self-pay

## 2012-08-31 ENCOUNTER — Encounter (HOSPITAL_COMMUNITY): Payer: Self-pay

## 2012-09-04 ENCOUNTER — Encounter (HOSPITAL_COMMUNITY): Payer: Self-pay

## 2012-09-06 ENCOUNTER — Encounter (HOSPITAL_COMMUNITY)
Admission: RE | Admit: 2012-09-06 | Discharge: 2012-09-06 | Disposition: A | Discharge status: Home / Self Care | Payer: Self-pay | Source: Ambulatory Visit | Attending: Interventional Cardiology | Admitting: Interventional Cardiology

## 2012-09-07 ENCOUNTER — Encounter (HOSPITAL_COMMUNITY)
Admission: RE | Admit: 2012-09-07 | Discharge: 2012-09-07 | Disposition: A | Discharge status: Home / Self Care | Payer: Self-pay | Source: Ambulatory Visit | Attending: Interventional Cardiology | Admitting: Interventional Cardiology

## 2012-09-11 ENCOUNTER — Encounter (HOSPITAL_COMMUNITY)
Admission: RE | Admit: 2012-09-11 | Discharge: 2012-09-11 | Disposition: A | Discharge status: Home / Self Care | Payer: Self-pay | Source: Ambulatory Visit | Attending: Interventional Cardiology | Admitting: Interventional Cardiology

## 2012-09-11 DIAGNOSIS — Z5189 Encounter for other specified aftercare: Secondary | ICD-10-CM | POA: Insufficient documentation

## 2012-09-11 DIAGNOSIS — I2510 Atherosclerotic heart disease of native coronary artery without angina pectoris: Secondary | ICD-10-CM | POA: Insufficient documentation

## 2012-09-11 DIAGNOSIS — I10 Essential (primary) hypertension: Secondary | ICD-10-CM | POA: Insufficient documentation

## 2012-09-11 DIAGNOSIS — Z8249 Family history of ischemic heart disease and other diseases of the circulatory system: Secondary | ICD-10-CM | POA: Insufficient documentation

## 2012-09-11 DIAGNOSIS — E7800 Pure hypercholesterolemia, unspecified: Secondary | ICD-10-CM | POA: Diagnosis not present

## 2012-09-12 ENCOUNTER — Encounter (HOSPITAL_COMMUNITY)
Admission: RE | Admit: 2012-09-12 | Discharge: 2012-09-12 | Disposition: A | Discharge status: Home / Self Care | Payer: Self-pay | Source: Ambulatory Visit | Attending: Interventional Cardiology | Admitting: Interventional Cardiology

## 2012-09-13 ENCOUNTER — Encounter (HOSPITAL_COMMUNITY)
Admission: RE | Admit: 2012-09-13 | Discharge: 2012-09-13 | Disposition: A | Discharge status: Home / Self Care | Payer: Self-pay | Source: Ambulatory Visit | Attending: Interventional Cardiology | Admitting: Interventional Cardiology

## 2012-09-14 ENCOUNTER — Encounter (HOSPITAL_COMMUNITY): Payer: Self-pay

## 2012-09-17 DIAGNOSIS — J0120 Acute ethmoidal sinusitis, unspecified: Secondary | ICD-10-CM | POA: Diagnosis not present

## 2012-09-18 ENCOUNTER — Encounter (HOSPITAL_COMMUNITY): Payer: Self-pay

## 2012-09-20 ENCOUNTER — Encounter (HOSPITAL_COMMUNITY): Payer: Self-pay

## 2012-09-21 ENCOUNTER — Encounter (HOSPITAL_COMMUNITY): Payer: Self-pay

## 2012-09-25 ENCOUNTER — Encounter (HOSPITAL_COMMUNITY)
Admission: RE | Admit: 2012-09-25 | Discharge: 2012-09-25 | Disposition: A | Discharge status: Home / Self Care | Payer: Self-pay | Source: Ambulatory Visit | Attending: Interventional Cardiology | Admitting: Interventional Cardiology

## 2012-09-27 ENCOUNTER — Encounter (HOSPITAL_COMMUNITY)
Admission: RE | Admit: 2012-09-27 | Discharge: 2012-09-27 | Disposition: A | Discharge status: Home / Self Care | Payer: Self-pay | Source: Ambulatory Visit | Attending: Interventional Cardiology | Admitting: Interventional Cardiology

## 2012-09-27 DIAGNOSIS — H43819 Vitreous degeneration, unspecified eye: Secondary | ICD-10-CM | POA: Diagnosis not present

## 2012-09-27 DIAGNOSIS — H2510 Age-related nuclear cataract, unspecified eye: Secondary | ICD-10-CM | POA: Diagnosis not present

## 2012-09-27 DIAGNOSIS — H33009 Unspecified retinal detachment with retinal break, unspecified eye: Secondary | ICD-10-CM | POA: Diagnosis not present

## 2012-09-27 DIAGNOSIS — D3130 Benign neoplasm of unspecified choroid: Secondary | ICD-10-CM | POA: Diagnosis not present

## 2012-09-27 DIAGNOSIS — J0120 Acute ethmoidal sinusitis, unspecified: Secondary | ICD-10-CM | POA: Diagnosis not present

## 2012-09-28 ENCOUNTER — Encounter (HOSPITAL_COMMUNITY)
Admission: RE | Admit: 2012-09-28 | Discharge: 2012-09-28 | Disposition: A | Discharge status: Home / Self Care | Payer: Self-pay | Source: Ambulatory Visit | Attending: Interventional Cardiology | Admitting: Interventional Cardiology

## 2012-10-02 ENCOUNTER — Encounter (HOSPITAL_COMMUNITY)
Admission: RE | Admit: 2012-10-02 | Discharge: 2012-10-02 | Disposition: A | Discharge status: Home / Self Care | Payer: Self-pay | Source: Ambulatory Visit | Attending: Interventional Cardiology | Admitting: Interventional Cardiology

## 2012-10-04 ENCOUNTER — Encounter (HOSPITAL_COMMUNITY)
Admission: RE | Admit: 2012-10-04 | Discharge: 2012-10-04 | Disposition: A | Discharge status: Home / Self Care | Payer: Self-pay | Source: Ambulatory Visit | Attending: Interventional Cardiology | Admitting: Interventional Cardiology

## 2012-10-05 ENCOUNTER — Encounter (HOSPITAL_COMMUNITY)
Admission: RE | Admit: 2012-10-05 | Discharge: 2012-10-05 | Disposition: A | Discharge status: Home / Self Care | Payer: Self-pay | Source: Ambulatory Visit | Attending: Interventional Cardiology | Admitting: Interventional Cardiology

## 2012-10-09 ENCOUNTER — Encounter (HOSPITAL_COMMUNITY)
Admission: RE | Admit: 2012-10-09 | Discharge: 2012-10-09 | Disposition: A | Discharge status: Home / Self Care | Payer: Self-pay | Source: Ambulatory Visit | Attending: Interventional Cardiology | Admitting: Interventional Cardiology

## 2012-10-11 ENCOUNTER — Encounter (HOSPITAL_COMMUNITY)
Admission: RE | Admit: 2012-10-11 | Discharge: 2012-10-11 | Disposition: A | Discharge status: Home / Self Care | Payer: Self-pay | Source: Ambulatory Visit | Attending: Interventional Cardiology | Admitting: Interventional Cardiology

## 2012-10-12 ENCOUNTER — Encounter (HOSPITAL_COMMUNITY)
Admission: RE | Admit: 2012-10-12 | Discharge: 2012-10-12 | Disposition: A | Discharge status: Home / Self Care | Payer: Self-pay | Source: Ambulatory Visit | Attending: Interventional Cardiology | Admitting: Interventional Cardiology

## 2012-10-12 DIAGNOSIS — I10 Essential (primary) hypertension: Secondary | ICD-10-CM | POA: Insufficient documentation

## 2012-10-12 DIAGNOSIS — Z8249 Family history of ischemic heart disease and other diseases of the circulatory system: Secondary | ICD-10-CM | POA: Insufficient documentation

## 2012-10-12 DIAGNOSIS — Z5189 Encounter for other specified aftercare: Secondary | ICD-10-CM | POA: Insufficient documentation

## 2012-10-12 DIAGNOSIS — I2510 Atherosclerotic heart disease of native coronary artery without angina pectoris: Secondary | ICD-10-CM | POA: Insufficient documentation

## 2012-10-16 ENCOUNTER — Encounter (HOSPITAL_COMMUNITY)
Admission: RE | Admit: 2012-10-16 | Discharge: 2012-10-16 | Disposition: A | Discharge status: Home / Self Care | Payer: Self-pay | Source: Ambulatory Visit | Attending: Interventional Cardiology | Admitting: Interventional Cardiology

## 2012-10-17 ENCOUNTER — Encounter (HOSPITAL_COMMUNITY)
Admission: RE | Admit: 2012-10-17 | Discharge: 2012-10-17 | Disposition: A | Discharge status: Home / Self Care | Payer: Self-pay | Source: Ambulatory Visit | Attending: Interventional Cardiology | Admitting: Interventional Cardiology

## 2012-10-18 ENCOUNTER — Encounter (HOSPITAL_COMMUNITY)
Admission: RE | Admit: 2012-10-18 | Discharge: 2012-10-18 | Disposition: A | Discharge status: Home / Self Care | Payer: Self-pay | Source: Ambulatory Visit | Attending: Interventional Cardiology | Admitting: Interventional Cardiology

## 2012-10-18 DIAGNOSIS — Z23 Encounter for immunization: Secondary | ICD-10-CM | POA: Diagnosis not present

## 2012-10-19 ENCOUNTER — Encounter (HOSPITAL_COMMUNITY)
Admission: RE | Admit: 2012-10-19 | Discharge: 2012-10-19 | Disposition: A | Discharge status: Home / Self Care | Payer: Self-pay | Source: Ambulatory Visit | Attending: Interventional Cardiology | Admitting: Interventional Cardiology

## 2012-10-23 ENCOUNTER — Encounter (HOSPITAL_COMMUNITY)
Admission: RE | Admit: 2012-10-23 | Discharge: 2012-10-23 | Disposition: A | Discharge status: Home / Self Care | Payer: Self-pay | Source: Ambulatory Visit | Attending: Interventional Cardiology | Admitting: Interventional Cardiology

## 2012-10-25 ENCOUNTER — Encounter (HOSPITAL_COMMUNITY)
Admission: RE | Admit: 2012-10-25 | Discharge: 2012-10-25 | Disposition: A | Discharge status: Home / Self Care | Payer: Self-pay | Source: Ambulatory Visit | Attending: Interventional Cardiology | Admitting: Interventional Cardiology

## 2012-10-26 ENCOUNTER — Encounter (HOSPITAL_COMMUNITY)
Admission: RE | Admit: 2012-10-26 | Discharge: 2012-10-26 | Disposition: A | Discharge status: Home / Self Care | Payer: Self-pay | Source: Ambulatory Visit | Attending: Interventional Cardiology | Admitting: Interventional Cardiology

## 2012-10-30 ENCOUNTER — Encounter (HOSPITAL_COMMUNITY)
Admission: RE | Admit: 2012-10-30 | Discharge: 2012-10-30 | Disposition: A | Discharge status: Home / Self Care | Payer: Self-pay | Source: Ambulatory Visit | Attending: Interventional Cardiology | Admitting: Interventional Cardiology

## 2012-11-01 ENCOUNTER — Encounter (HOSPITAL_COMMUNITY)
Admission: RE | Admit: 2012-11-01 | Discharge: 2012-11-01 | Disposition: A | Discharge status: Home / Self Care | Payer: Self-pay | Source: Ambulatory Visit | Attending: Interventional Cardiology | Admitting: Interventional Cardiology

## 2012-11-02 ENCOUNTER — Encounter (HOSPITAL_COMMUNITY)
Admission: RE | Admit: 2012-11-02 | Discharge: 2012-11-02 | Disposition: A | Discharge status: Home / Self Care | Payer: Self-pay | Source: Ambulatory Visit | Attending: Interventional Cardiology | Admitting: Interventional Cardiology

## 2012-11-02 DIAGNOSIS — Z85820 Personal history of malignant melanoma of skin: Secondary | ICD-10-CM | POA: Diagnosis not present

## 2012-11-02 DIAGNOSIS — L819 Disorder of pigmentation, unspecified: Secondary | ICD-10-CM | POA: Diagnosis not present

## 2012-11-02 DIAGNOSIS — D1801 Hemangioma of skin and subcutaneous tissue: Secondary | ICD-10-CM | POA: Diagnosis not present

## 2012-11-02 DIAGNOSIS — L821 Other seborrheic keratosis: Secondary | ICD-10-CM | POA: Diagnosis not present

## 2012-11-02 DIAGNOSIS — D239 Other benign neoplasm of skin, unspecified: Secondary | ICD-10-CM | POA: Diagnosis not present

## 2012-11-06 ENCOUNTER — Encounter (HOSPITAL_COMMUNITY)
Admission: RE | Admit: 2012-11-06 | Discharge: 2012-11-06 | Disposition: A | Discharge status: Home / Self Care | Payer: Self-pay | Source: Ambulatory Visit | Attending: Interventional Cardiology | Admitting: Interventional Cardiology

## 2012-11-07 ENCOUNTER — Encounter (HOSPITAL_COMMUNITY)
Admission: RE | Admit: 2012-11-07 | Discharge: 2012-11-07 | Disposition: A | Discharge status: Home / Self Care | Payer: Self-pay | Source: Ambulatory Visit | Attending: Interventional Cardiology | Admitting: Interventional Cardiology

## 2012-11-13 ENCOUNTER — Encounter (HOSPITAL_COMMUNITY)
Admission: RE | Admit: 2012-11-13 | Discharge: 2012-11-13 | Disposition: A | Discharge status: Home / Self Care | Payer: Self-pay | Source: Ambulatory Visit | Attending: Interventional Cardiology | Admitting: Interventional Cardiology

## 2012-11-13 DIAGNOSIS — I10 Essential (primary) hypertension: Secondary | ICD-10-CM | POA: Insufficient documentation

## 2012-11-13 DIAGNOSIS — Z8249 Family history of ischemic heart disease and other diseases of the circulatory system: Secondary | ICD-10-CM | POA: Insufficient documentation

## 2012-11-13 DIAGNOSIS — I2510 Atherosclerotic heart disease of native coronary artery without angina pectoris: Secondary | ICD-10-CM | POA: Insufficient documentation

## 2012-11-13 DIAGNOSIS — Z5189 Encounter for other specified aftercare: Secondary | ICD-10-CM | POA: Insufficient documentation

## 2012-11-15 ENCOUNTER — Encounter (HOSPITAL_COMMUNITY)
Admission: RE | Admit: 2012-11-15 | Discharge: 2012-11-15 | Disposition: A | Discharge status: Home / Self Care | Payer: Self-pay | Source: Ambulatory Visit | Attending: Interventional Cardiology | Admitting: Interventional Cardiology

## 2012-11-15 DIAGNOSIS — E7800 Pure hypercholesterolemia, unspecified: Secondary | ICD-10-CM | POA: Diagnosis not present

## 2012-11-16 ENCOUNTER — Encounter (HOSPITAL_COMMUNITY)
Admission: RE | Admit: 2012-11-16 | Discharge: 2012-11-16 | Disposition: A | Discharge status: Home / Self Care | Payer: Self-pay | Source: Ambulatory Visit | Attending: Interventional Cardiology | Admitting: Interventional Cardiology

## 2012-11-20 ENCOUNTER — Encounter (HOSPITAL_COMMUNITY)
Admission: RE | Admit: 2012-11-20 | Discharge: 2012-11-20 | Disposition: A | Discharge status: Home / Self Care | Payer: Self-pay | Source: Ambulatory Visit | Attending: Interventional Cardiology | Admitting: Interventional Cardiology

## 2012-11-22 ENCOUNTER — Encounter (HOSPITAL_COMMUNITY)
Admission: RE | Admit: 2012-11-22 | Discharge: 2012-11-22 | Disposition: A | Discharge status: Home / Self Care | Payer: Self-pay | Source: Ambulatory Visit | Attending: Interventional Cardiology | Admitting: Interventional Cardiology

## 2012-11-23 ENCOUNTER — Encounter (HOSPITAL_COMMUNITY)
Admission: RE | Admit: 2012-11-23 | Discharge: 2012-11-23 | Disposition: A | Discharge status: Home / Self Care | Payer: Self-pay | Source: Ambulatory Visit | Attending: Interventional Cardiology | Admitting: Interventional Cardiology

## 2012-11-27 ENCOUNTER — Encounter (HOSPITAL_COMMUNITY)
Admission: RE | Admit: 2012-11-27 | Discharge: 2012-11-27 | Disposition: A | Discharge status: Home / Self Care | Payer: Self-pay | Source: Ambulatory Visit | Attending: Interventional Cardiology | Admitting: Interventional Cardiology

## 2012-11-29 ENCOUNTER — Encounter (HOSPITAL_COMMUNITY)
Admission: RE | Admit: 2012-11-29 | Discharge: 2012-11-29 | Disposition: A | Discharge status: Home / Self Care | Payer: Self-pay | Source: Ambulatory Visit | Attending: Interventional Cardiology | Admitting: Interventional Cardiology

## 2012-11-30 ENCOUNTER — Encounter (HOSPITAL_COMMUNITY)
Admission: RE | Admit: 2012-11-30 | Discharge: 2012-11-30 | Disposition: A | Discharge status: Home / Self Care | Payer: Self-pay | Source: Ambulatory Visit | Attending: Interventional Cardiology | Admitting: Interventional Cardiology

## 2012-12-04 ENCOUNTER — Encounter (HOSPITAL_COMMUNITY): Payer: Self-pay

## 2012-12-06 ENCOUNTER — Encounter (HOSPITAL_COMMUNITY): Payer: Self-pay

## 2012-12-07 ENCOUNTER — Encounter (HOSPITAL_COMMUNITY)
Admission: RE | Admit: 2012-12-07 | Discharge: 2012-12-07 | Disposition: A | Discharge status: Home / Self Care | Payer: Self-pay | Source: Ambulatory Visit | Attending: Interventional Cardiology | Admitting: Interventional Cardiology

## 2012-12-11 ENCOUNTER — Encounter (HOSPITAL_COMMUNITY)
Admission: RE | Admit: 2012-12-11 | Discharge: 2012-12-11 | Disposition: A | Discharge status: Home / Self Care | Payer: Self-pay | Source: Ambulatory Visit | Attending: Interventional Cardiology | Admitting: Interventional Cardiology

## 2012-12-13 ENCOUNTER — Encounter (HOSPITAL_COMMUNITY)
Admission: RE | Admit: 2012-12-13 | Discharge: 2012-12-13 | Disposition: A | Discharge status: Home / Self Care | Payer: Self-pay | Source: Ambulatory Visit | Attending: Interventional Cardiology | Admitting: Interventional Cardiology

## 2012-12-13 DIAGNOSIS — I10 Essential (primary) hypertension: Secondary | ICD-10-CM | POA: Insufficient documentation

## 2012-12-13 DIAGNOSIS — Z5189 Encounter for other specified aftercare: Secondary | ICD-10-CM | POA: Insufficient documentation

## 2012-12-13 DIAGNOSIS — I2510 Atherosclerotic heart disease of native coronary artery without angina pectoris: Secondary | ICD-10-CM | POA: Insufficient documentation

## 2012-12-13 DIAGNOSIS — Z8249 Family history of ischemic heart disease and other diseases of the circulatory system: Secondary | ICD-10-CM | POA: Insufficient documentation

## 2012-12-14 ENCOUNTER — Encounter (HOSPITAL_COMMUNITY)
Admission: RE | Admit: 2012-12-14 | Discharge: 2012-12-14 | Disposition: A | Discharge status: Home / Self Care | Payer: Self-pay | Source: Ambulatory Visit | Attending: Interventional Cardiology | Admitting: Interventional Cardiology

## 2012-12-18 ENCOUNTER — Encounter (HOSPITAL_COMMUNITY)
Admission: RE | Admit: 2012-12-18 | Discharge: 2012-12-18 | Disposition: A | Discharge status: Home / Self Care | Payer: Self-pay | Source: Ambulatory Visit | Attending: Interventional Cardiology | Admitting: Interventional Cardiology

## 2012-12-19 ENCOUNTER — Encounter (HOSPITAL_COMMUNITY)
Admission: RE | Admit: 2012-12-19 | Discharge: 2012-12-19 | Disposition: A | Discharge status: Home / Self Care | Payer: Self-pay | Source: Ambulatory Visit | Attending: Interventional Cardiology | Admitting: Interventional Cardiology

## 2012-12-20 ENCOUNTER — Encounter (HOSPITAL_COMMUNITY)
Admission: RE | Admit: 2012-12-20 | Discharge: 2012-12-20 | Disposition: A | Discharge status: Home / Self Care | Payer: Self-pay | Source: Ambulatory Visit | Attending: Interventional Cardiology | Admitting: Interventional Cardiology

## 2012-12-21 ENCOUNTER — Encounter (HOSPITAL_COMMUNITY): Payer: Self-pay

## 2012-12-25 ENCOUNTER — Encounter (HOSPITAL_COMMUNITY)
Admission: RE | Admit: 2012-12-25 | Discharge: 2012-12-25 | Disposition: A | Discharge status: Home / Self Care | Payer: Self-pay | Source: Ambulatory Visit | Attending: Interventional Cardiology | Admitting: Interventional Cardiology

## 2012-12-27 ENCOUNTER — Encounter (HOSPITAL_COMMUNITY)
Admission: RE | Admit: 2012-12-27 | Discharge: 2012-12-27 | Disposition: A | Discharge status: Home / Self Care | Payer: Self-pay | Source: Ambulatory Visit | Attending: Interventional Cardiology | Admitting: Interventional Cardiology

## 2012-12-28 ENCOUNTER — Encounter (HOSPITAL_COMMUNITY)
Admission: RE | Admit: 2012-12-28 | Discharge: 2012-12-28 | Disposition: A | Discharge status: Home / Self Care | Payer: Self-pay | Source: Ambulatory Visit | Attending: Interventional Cardiology | Admitting: Interventional Cardiology

## 2013-01-01 ENCOUNTER — Encounter (HOSPITAL_COMMUNITY)
Admission: RE | Admit: 2013-01-01 | Discharge: 2013-01-01 | Disposition: A | Discharge status: Home / Self Care | Payer: Self-pay | Source: Ambulatory Visit | Attending: Interventional Cardiology | Admitting: Interventional Cardiology

## 2013-01-03 ENCOUNTER — Encounter (HOSPITAL_COMMUNITY)
Admission: RE | Admit: 2013-01-03 | Discharge: 2013-01-03 | Disposition: A | Discharge status: Home / Self Care | Payer: Self-pay | Source: Ambulatory Visit | Attending: Interventional Cardiology | Admitting: Interventional Cardiology

## 2013-01-04 ENCOUNTER — Encounter (HOSPITAL_COMMUNITY)
Admission: RE | Admit: 2013-01-04 | Discharge: 2013-01-04 | Disposition: A | Discharge status: Home / Self Care | Payer: Self-pay | Source: Ambulatory Visit | Attending: Interventional Cardiology | Admitting: Interventional Cardiology

## 2013-01-08 ENCOUNTER — Encounter (HOSPITAL_COMMUNITY)
Admission: RE | Admit: 2013-01-08 | Discharge: 2013-01-08 | Disposition: A | Discharge status: Home / Self Care | Payer: Self-pay | Source: Ambulatory Visit | Attending: Interventional Cardiology | Admitting: Interventional Cardiology

## 2013-01-10 ENCOUNTER — Encounter (HOSPITAL_COMMUNITY)
Admission: RE | Admit: 2013-01-10 | Discharge: 2013-01-10 | Disposition: A | Discharge status: Home / Self Care | Payer: Self-pay | Source: Ambulatory Visit | Attending: Interventional Cardiology | Admitting: Interventional Cardiology

## 2013-01-11 ENCOUNTER — Encounter (HOSPITAL_COMMUNITY): Payer: Self-pay

## 2013-01-15 ENCOUNTER — Encounter (HOSPITAL_COMMUNITY)
Admission: RE | Admit: 2013-01-15 | Discharge: 2013-01-15 | Disposition: A | Discharge status: Home / Self Care | Payer: Self-pay | Source: Ambulatory Visit | Attending: Interventional Cardiology | Admitting: Interventional Cardiology

## 2013-01-15 DIAGNOSIS — I10 Essential (primary) hypertension: Secondary | ICD-10-CM | POA: Insufficient documentation

## 2013-01-15 DIAGNOSIS — Z5189 Encounter for other specified aftercare: Secondary | ICD-10-CM | POA: Insufficient documentation

## 2013-01-15 DIAGNOSIS — Z8249 Family history of ischemic heart disease and other diseases of the circulatory system: Secondary | ICD-10-CM | POA: Insufficient documentation

## 2013-01-15 DIAGNOSIS — I2510 Atherosclerotic heart disease of native coronary artery without angina pectoris: Secondary | ICD-10-CM | POA: Insufficient documentation

## 2013-01-17 ENCOUNTER — Encounter (HOSPITAL_COMMUNITY)
Admission: RE | Admit: 2013-01-17 | Discharge: 2013-01-17 | Disposition: A | Discharge status: Home / Self Care | Payer: Self-pay | Source: Ambulatory Visit | Attending: Interventional Cardiology | Admitting: Interventional Cardiology

## 2013-01-18 ENCOUNTER — Encounter (HOSPITAL_COMMUNITY)
Admission: RE | Admit: 2013-01-18 | Discharge: 2013-01-18 | Disposition: A | Discharge status: Home / Self Care | Payer: Self-pay | Source: Ambulatory Visit | Attending: Interventional Cardiology | Admitting: Interventional Cardiology

## 2013-01-22 ENCOUNTER — Encounter (HOSPITAL_COMMUNITY)
Admission: RE | Admit: 2013-01-22 | Discharge: 2013-01-22 | Disposition: A | Discharge status: Home / Self Care | Payer: Self-pay | Source: Ambulatory Visit | Attending: Interventional Cardiology | Admitting: Interventional Cardiology

## 2013-01-23 ENCOUNTER — Encounter (HOSPITAL_COMMUNITY)
Admission: RE | Admit: 2013-01-23 | Discharge: 2013-01-23 | Disposition: A | Discharge status: Home / Self Care | Payer: Self-pay | Source: Ambulatory Visit | Attending: Interventional Cardiology | Admitting: Interventional Cardiology

## 2013-01-24 ENCOUNTER — Encounter (HOSPITAL_COMMUNITY): Payer: Self-pay

## 2013-01-25 ENCOUNTER — Encounter (HOSPITAL_COMMUNITY): Payer: Self-pay

## 2013-01-29 ENCOUNTER — Encounter (HOSPITAL_COMMUNITY)
Admission: RE | Admit: 2013-01-29 | Discharge: 2013-01-29 | Disposition: A | Discharge status: Home / Self Care | Payer: Self-pay | Source: Ambulatory Visit | Attending: Interventional Cardiology | Admitting: Interventional Cardiology

## 2013-01-31 ENCOUNTER — Encounter (HOSPITAL_COMMUNITY)
Admission: RE | Admit: 2013-01-31 | Discharge: 2013-01-31 | Disposition: A | Discharge status: Home / Self Care | Payer: Self-pay | Source: Ambulatory Visit | Attending: Interventional Cardiology | Admitting: Interventional Cardiology

## 2013-02-01 ENCOUNTER — Encounter (HOSPITAL_COMMUNITY)
Admission: RE | Admit: 2013-02-01 | Discharge: 2013-02-01 | Disposition: A | Discharge status: Home / Self Care | Payer: Self-pay | Source: Ambulatory Visit | Attending: Interventional Cardiology | Admitting: Interventional Cardiology

## 2013-02-05 ENCOUNTER — Encounter (HOSPITAL_COMMUNITY)
Admission: RE | Admit: 2013-02-05 | Discharge: 2013-02-05 | Disposition: A | Discharge status: Home / Self Care | Payer: Self-pay | Source: Ambulatory Visit | Attending: Interventional Cardiology | Admitting: Interventional Cardiology

## 2013-02-06 ENCOUNTER — Encounter (HOSPITAL_COMMUNITY)
Admission: RE | Admit: 2013-02-06 | Discharge: 2013-02-06 | Disposition: A | Discharge status: Home / Self Care | Payer: Self-pay | Source: Ambulatory Visit | Attending: Interventional Cardiology | Admitting: Interventional Cardiology

## 2013-02-06 DIAGNOSIS — E7800 Pure hypercholesterolemia, unspecified: Secondary | ICD-10-CM | POA: Diagnosis not present

## 2013-02-07 ENCOUNTER — Encounter (HOSPITAL_COMMUNITY)
Admission: RE | Admit: 2013-02-07 | Discharge: 2013-02-07 | Disposition: A | Discharge status: Home / Self Care | Payer: Self-pay | Source: Ambulatory Visit | Attending: Interventional Cardiology | Admitting: Interventional Cardiology

## 2013-02-08 ENCOUNTER — Encounter (HOSPITAL_COMMUNITY): Payer: Self-pay

## 2013-02-12 ENCOUNTER — Encounter (HOSPITAL_COMMUNITY): Payer: Self-pay

## 2013-02-12 DIAGNOSIS — Z8249 Family history of ischemic heart disease and other diseases of the circulatory system: Secondary | ICD-10-CM | POA: Insufficient documentation

## 2013-02-12 DIAGNOSIS — I2510 Atherosclerotic heart disease of native coronary artery without angina pectoris: Secondary | ICD-10-CM | POA: Insufficient documentation

## 2013-02-12 DIAGNOSIS — I10 Essential (primary) hypertension: Secondary | ICD-10-CM | POA: Insufficient documentation

## 2013-02-12 DIAGNOSIS — Z5189 Encounter for other specified aftercare: Secondary | ICD-10-CM | POA: Insufficient documentation

## 2013-02-14 ENCOUNTER — Encounter (HOSPITAL_COMMUNITY): Payer: Self-pay

## 2013-02-15 ENCOUNTER — Encounter (HOSPITAL_COMMUNITY): Payer: Self-pay

## 2013-02-19 ENCOUNTER — Encounter (HOSPITAL_COMMUNITY)
Admission: RE | Admit: 2013-02-19 | Discharge: 2013-02-19 | Disposition: A | Discharge status: Home / Self Care | Payer: Self-pay | Source: Ambulatory Visit | Attending: Interventional Cardiology | Admitting: Interventional Cardiology

## 2013-02-21 ENCOUNTER — Encounter (HOSPITAL_COMMUNITY)
Admission: RE | Admit: 2013-02-21 | Discharge: 2013-02-21 | Disposition: A | Discharge status: Home / Self Care | Payer: Self-pay | Source: Ambulatory Visit | Attending: Interventional Cardiology | Admitting: Interventional Cardiology

## 2013-02-22 ENCOUNTER — Encounter (HOSPITAL_COMMUNITY)
Admission: RE | Admit: 2013-02-22 | Discharge: 2013-02-22 | Disposition: A | Discharge status: Home / Self Care | Payer: Self-pay | Source: Ambulatory Visit | Attending: Interventional Cardiology | Admitting: Interventional Cardiology

## 2013-02-26 ENCOUNTER — Encounter (HOSPITAL_COMMUNITY): Payer: Self-pay

## 2013-02-27 ENCOUNTER — Encounter (HOSPITAL_COMMUNITY)
Admission: RE | Admit: 2013-02-27 | Discharge: 2013-02-27 | Disposition: A | Discharge status: Home / Self Care | Payer: Self-pay | Source: Ambulatory Visit | Attending: Interventional Cardiology | Admitting: Interventional Cardiology

## 2013-02-28 ENCOUNTER — Encounter (HOSPITAL_COMMUNITY)
Admission: RE | Admit: 2013-02-28 | Discharge: 2013-02-28 | Disposition: A | Discharge status: Home / Self Care | Payer: Self-pay | Source: Ambulatory Visit | Attending: Interventional Cardiology | Admitting: Interventional Cardiology

## 2013-03-01 ENCOUNTER — Encounter (HOSPITAL_COMMUNITY): Payer: Self-pay

## 2013-03-05 ENCOUNTER — Encounter (HOSPITAL_COMMUNITY)
Admission: RE | Admit: 2013-03-05 | Discharge: 2013-03-05 | Disposition: A | Discharge status: Home / Self Care | Payer: Self-pay | Source: Ambulatory Visit | Attending: Interventional Cardiology | Admitting: Interventional Cardiology

## 2013-03-07 ENCOUNTER — Encounter (HOSPITAL_COMMUNITY)
Admission: RE | Admit: 2013-03-07 | Discharge: 2013-03-07 | Disposition: A | Discharge status: Home / Self Care | Payer: Self-pay | Source: Ambulatory Visit | Attending: Interventional Cardiology | Admitting: Interventional Cardiology

## 2013-03-08 ENCOUNTER — Encounter (HOSPITAL_COMMUNITY)
Admission: RE | Admit: 2013-03-08 | Discharge: 2013-03-08 | Disposition: A | Discharge status: Home / Self Care | Payer: Self-pay | Source: Ambulatory Visit | Attending: Interventional Cardiology | Admitting: Interventional Cardiology

## 2013-03-12 ENCOUNTER — Encounter (HOSPITAL_COMMUNITY): Payer: Self-pay

## 2013-03-12 DIAGNOSIS — Z5189 Encounter for other specified aftercare: Secondary | ICD-10-CM | POA: Insufficient documentation

## 2013-03-12 DIAGNOSIS — I10 Essential (primary) hypertension: Secondary | ICD-10-CM | POA: Insufficient documentation

## 2013-03-12 DIAGNOSIS — I2510 Atherosclerotic heart disease of native coronary artery without angina pectoris: Secondary | ICD-10-CM | POA: Insufficient documentation

## 2013-03-12 DIAGNOSIS — Z8249 Family history of ischemic heart disease and other diseases of the circulatory system: Secondary | ICD-10-CM | POA: Insufficient documentation

## 2013-03-14 ENCOUNTER — Encounter (HOSPITAL_COMMUNITY)
Admission: RE | Admit: 2013-03-14 | Discharge: 2013-03-14 | Disposition: A | Discharge status: Home / Self Care | Payer: Self-pay | Source: Ambulatory Visit | Attending: Interventional Cardiology | Admitting: Interventional Cardiology

## 2013-03-15 ENCOUNTER — Encounter (HOSPITAL_COMMUNITY)
Admission: RE | Admit: 2013-03-15 | Discharge: 2013-03-15 | Disposition: A | Discharge status: Home / Self Care | Payer: Self-pay | Source: Ambulatory Visit | Attending: Interventional Cardiology | Admitting: Interventional Cardiology

## 2013-03-19 ENCOUNTER — Encounter (HOSPITAL_COMMUNITY)
Admission: RE | Admit: 2013-03-19 | Discharge: 2013-03-19 | Disposition: A | Discharge status: Home / Self Care | Payer: Self-pay | Source: Ambulatory Visit | Attending: Interventional Cardiology | Admitting: Interventional Cardiology

## 2013-03-21 ENCOUNTER — Encounter (HOSPITAL_COMMUNITY)
Admission: RE | Admit: 2013-03-21 | Discharge: 2013-03-21 | Disposition: A | Discharge status: Home / Self Care | Payer: Self-pay | Source: Ambulatory Visit | Attending: Interventional Cardiology | Admitting: Interventional Cardiology

## 2013-03-22 ENCOUNTER — Encounter (HOSPITAL_COMMUNITY)
Admission: RE | Admit: 2013-03-22 | Discharge: 2013-03-22 | Disposition: A | Discharge status: Home / Self Care | Payer: Self-pay | Source: Ambulatory Visit | Attending: Interventional Cardiology | Admitting: Interventional Cardiology

## 2013-03-25 ENCOUNTER — Encounter (HOSPITAL_COMMUNITY)
Admission: RE | Admit: 2013-03-25 | Discharge: 2013-03-25 | Disposition: A | Discharge status: Home / Self Care | Payer: Self-pay | Source: Ambulatory Visit | Attending: Interventional Cardiology | Admitting: Interventional Cardiology

## 2013-03-26 ENCOUNTER — Encounter (HOSPITAL_COMMUNITY)
Admission: RE | Admit: 2013-03-26 | Discharge: 2013-03-26 | Disposition: A | Discharge status: Home / Self Care | Payer: Self-pay | Source: Ambulatory Visit | Attending: Interventional Cardiology | Admitting: Interventional Cardiology

## 2013-03-28 ENCOUNTER — Encounter (HOSPITAL_COMMUNITY)
Admission: RE | Admit: 2013-03-28 | Discharge: 2013-03-28 | Disposition: A | Discharge status: Home / Self Care | Payer: Self-pay | Source: Ambulatory Visit | Attending: Interventional Cardiology | Admitting: Interventional Cardiology

## 2013-03-29 ENCOUNTER — Encounter (HOSPITAL_COMMUNITY): Payer: Self-pay

## 2013-04-02 ENCOUNTER — Encounter (HOSPITAL_COMMUNITY)
Admission: RE | Admit: 2013-04-02 | Discharge: 2013-04-02 | Disposition: A | Discharge status: Home / Self Care | Payer: Self-pay | Source: Ambulatory Visit | Attending: Interventional Cardiology | Admitting: Interventional Cardiology

## 2013-04-04 ENCOUNTER — Encounter (HOSPITAL_COMMUNITY)
Admission: RE | Admit: 2013-04-04 | Discharge: 2013-04-04 | Disposition: A | Discharge status: Home / Self Care | Payer: Self-pay | Source: Ambulatory Visit | Attending: Interventional Cardiology | Admitting: Interventional Cardiology

## 2013-04-05 ENCOUNTER — Encounter (HOSPITAL_COMMUNITY)
Admission: RE | Admit: 2013-04-05 | Discharge: 2013-04-05 | Disposition: A | Discharge status: Home / Self Care | Payer: Self-pay | Source: Ambulatory Visit | Attending: Interventional Cardiology | Admitting: Interventional Cardiology

## 2013-04-08 DIAGNOSIS — H2510 Age-related nuclear cataract, unspecified eye: Secondary | ICD-10-CM | POA: Diagnosis not present

## 2013-04-08 DIAGNOSIS — H04129 Dry eye syndrome of unspecified lacrimal gland: Secondary | ICD-10-CM | POA: Diagnosis not present

## 2013-04-08 DIAGNOSIS — H33009 Unspecified retinal detachment with retinal break, unspecified eye: Secondary | ICD-10-CM | POA: Diagnosis not present

## 2013-04-08 DIAGNOSIS — D3130 Benign neoplasm of unspecified choroid: Secondary | ICD-10-CM | POA: Diagnosis not present

## 2013-04-09 ENCOUNTER — Encounter (HOSPITAL_COMMUNITY)
Admission: RE | Admit: 2013-04-09 | Discharge: 2013-04-09 | Disposition: A | Discharge status: Home / Self Care | Payer: Self-pay | Source: Ambulatory Visit | Attending: Interventional Cardiology | Admitting: Interventional Cardiology

## 2013-04-10 ENCOUNTER — Encounter (HOSPITAL_COMMUNITY)
Admission: RE | Admit: 2013-04-10 | Discharge: 2013-04-10 | Disposition: A | Discharge status: Home / Self Care | Payer: Self-pay | Source: Ambulatory Visit | Attending: Interventional Cardiology | Admitting: Interventional Cardiology

## 2013-04-11 ENCOUNTER — Encounter (HOSPITAL_COMMUNITY)
Admission: RE | Admit: 2013-04-11 | Discharge: 2013-04-11 | Disposition: A | Discharge status: Home / Self Care | Payer: Self-pay | Source: Ambulatory Visit | Attending: Interventional Cardiology | Admitting: Interventional Cardiology

## 2013-04-11 DIAGNOSIS — Z8249 Family history of ischemic heart disease and other diseases of the circulatory system: Secondary | ICD-10-CM | POA: Insufficient documentation

## 2013-04-11 DIAGNOSIS — I10 Essential (primary) hypertension: Secondary | ICD-10-CM | POA: Insufficient documentation

## 2013-04-11 DIAGNOSIS — I2510 Atherosclerotic heart disease of native coronary artery without angina pectoris: Secondary | ICD-10-CM | POA: Insufficient documentation

## 2013-04-11 DIAGNOSIS — Z5189 Encounter for other specified aftercare: Secondary | ICD-10-CM | POA: Insufficient documentation

## 2013-04-12 ENCOUNTER — Encounter (HOSPITAL_COMMUNITY): Payer: Self-pay

## 2013-04-16 ENCOUNTER — Encounter (HOSPITAL_COMMUNITY)
Admission: RE | Admit: 2013-04-16 | Discharge: 2013-04-16 | Disposition: A | Discharge status: Home / Self Care | Payer: Self-pay | Source: Ambulatory Visit | Attending: Interventional Cardiology | Admitting: Interventional Cardiology

## 2013-04-18 ENCOUNTER — Encounter (HOSPITAL_COMMUNITY)
Admission: RE | Admit: 2013-04-18 | Discharge: 2013-04-18 | Disposition: A | Discharge status: Home / Self Care | Payer: Self-pay | Source: Ambulatory Visit | Attending: Interventional Cardiology | Admitting: Interventional Cardiology

## 2013-04-19 ENCOUNTER — Encounter (HOSPITAL_COMMUNITY)
Admission: RE | Admit: 2013-04-19 | Discharge: 2013-04-19 | Disposition: A | Discharge status: Home / Self Care | Payer: Self-pay | Source: Ambulatory Visit | Attending: Interventional Cardiology | Admitting: Interventional Cardiology

## 2013-04-23 ENCOUNTER — Encounter (HOSPITAL_COMMUNITY): Payer: Self-pay

## 2013-04-25 ENCOUNTER — Encounter (HOSPITAL_COMMUNITY)
Admission: RE | Admit: 2013-04-25 | Discharge: 2013-04-25 | Disposition: A | Discharge status: Home / Self Care | Payer: Self-pay | Source: Ambulatory Visit | Attending: Interventional Cardiology | Admitting: Interventional Cardiology

## 2013-04-26 ENCOUNTER — Encounter (HOSPITAL_COMMUNITY)
Admission: RE | Admit: 2013-04-26 | Discharge: 2013-04-26 | Disposition: A | Discharge status: Home / Self Care | Payer: Self-pay | Source: Ambulatory Visit | Attending: Interventional Cardiology | Admitting: Interventional Cardiology

## 2013-04-30 ENCOUNTER — Encounter (HOSPITAL_COMMUNITY)
Admission: RE | Admit: 2013-04-30 | Discharge: 2013-04-30 | Disposition: A | Discharge status: Home / Self Care | Payer: Self-pay | Source: Ambulatory Visit | Attending: Interventional Cardiology | Admitting: Interventional Cardiology

## 2013-05-02 ENCOUNTER — Encounter (HOSPITAL_COMMUNITY)
Admission: RE | Admit: 2013-05-02 | Discharge: 2013-05-02 | Disposition: A | Discharge status: Home / Self Care | Payer: Self-pay | Source: Ambulatory Visit | Attending: Interventional Cardiology | Admitting: Interventional Cardiology

## 2013-05-03 ENCOUNTER — Encounter (HOSPITAL_COMMUNITY)
Admission: RE | Admit: 2013-05-03 | Discharge: 2013-05-03 | Disposition: A | Discharge status: Home / Self Care | Payer: Self-pay | Source: Ambulatory Visit | Attending: Interventional Cardiology | Admitting: Interventional Cardiology

## 2013-05-07 ENCOUNTER — Encounter (HOSPITAL_COMMUNITY)
Admission: RE | Admit: 2013-05-07 | Discharge: 2013-05-07 | Disposition: A | Discharge status: Home / Self Care | Payer: Self-pay | Source: Ambulatory Visit | Attending: Interventional Cardiology | Admitting: Interventional Cardiology

## 2013-05-07 DIAGNOSIS — L578 Other skin changes due to chronic exposure to nonionizing radiation: Secondary | ICD-10-CM | POA: Diagnosis not present

## 2013-05-07 DIAGNOSIS — L819 Disorder of pigmentation, unspecified: Secondary | ICD-10-CM | POA: Diagnosis not present

## 2013-05-07 DIAGNOSIS — D485 Neoplasm of uncertain behavior of skin: Secondary | ICD-10-CM | POA: Diagnosis not present

## 2013-05-07 DIAGNOSIS — L820 Inflamed seborrheic keratosis: Secondary | ICD-10-CM | POA: Diagnosis not present

## 2013-05-07 DIAGNOSIS — Z85820 Personal history of malignant melanoma of skin: Secondary | ICD-10-CM | POA: Diagnosis not present

## 2013-05-07 DIAGNOSIS — D239 Other benign neoplasm of skin, unspecified: Secondary | ICD-10-CM | POA: Diagnosis not present

## 2013-05-07 DIAGNOSIS — D1801 Hemangioma of skin and subcutaneous tissue: Secondary | ICD-10-CM | POA: Diagnosis not present

## 2013-05-07 DIAGNOSIS — D235 Other benign neoplasm of skin of trunk: Secondary | ICD-10-CM | POA: Diagnosis not present

## 2013-05-07 DIAGNOSIS — L821 Other seborrheic keratosis: Secondary | ICD-10-CM | POA: Diagnosis not present

## 2013-05-09 ENCOUNTER — Encounter (HOSPITAL_COMMUNITY)
Admission: RE | Admit: 2013-05-09 | Discharge: 2013-05-09 | Disposition: A | Discharge status: Home / Self Care | Payer: Self-pay | Source: Ambulatory Visit | Attending: Interventional Cardiology | Admitting: Interventional Cardiology

## 2013-05-10 ENCOUNTER — Encounter (HOSPITAL_COMMUNITY)
Admission: RE | Admit: 2013-05-10 | Discharge: 2013-05-10 | Disposition: A | Discharge status: Home / Self Care | Payer: Self-pay | Source: Ambulatory Visit | Attending: Interventional Cardiology | Admitting: Interventional Cardiology

## 2013-05-13 ENCOUNTER — Encounter (HOSPITAL_COMMUNITY)
Admission: RE | Admit: 2013-05-13 | Discharge: 2013-05-13 | Disposition: A | Discharge status: Home / Self Care | Payer: Self-pay | Source: Ambulatory Visit | Attending: Interventional Cardiology | Admitting: Interventional Cardiology

## 2013-05-13 DIAGNOSIS — Z5189 Encounter for other specified aftercare: Secondary | ICD-10-CM | POA: Insufficient documentation

## 2013-05-13 DIAGNOSIS — I2510 Atherosclerotic heart disease of native coronary artery without angina pectoris: Secondary | ICD-10-CM | POA: Insufficient documentation

## 2013-05-13 DIAGNOSIS — I10 Essential (primary) hypertension: Secondary | ICD-10-CM | POA: Insufficient documentation

## 2013-05-13 DIAGNOSIS — Z8249 Family history of ischemic heart disease and other diseases of the circulatory system: Secondary | ICD-10-CM | POA: Insufficient documentation

## 2013-05-14 ENCOUNTER — Encounter (HOSPITAL_COMMUNITY)
Admission: RE | Admit: 2013-05-14 | Discharge: 2013-05-14 | Disposition: A | Discharge status: Home / Self Care | Payer: Self-pay | Source: Ambulatory Visit | Attending: Interventional Cardiology | Admitting: Interventional Cardiology

## 2013-05-16 ENCOUNTER — Encounter (HOSPITAL_COMMUNITY): Payer: Self-pay

## 2013-05-17 ENCOUNTER — Encounter (HOSPITAL_COMMUNITY): Payer: Self-pay

## 2013-05-21 ENCOUNTER — Encounter (HOSPITAL_COMMUNITY)
Admission: RE | Admit: 2013-05-21 | Discharge: 2013-05-21 | Disposition: A | Discharge status: Home / Self Care | Payer: Self-pay | Source: Ambulatory Visit | Attending: Interventional Cardiology | Admitting: Interventional Cardiology

## 2013-05-23 ENCOUNTER — Encounter (HOSPITAL_COMMUNITY)
Admission: RE | Admit: 2013-05-23 | Discharge: 2013-05-23 | Disposition: A | Discharge status: Home / Self Care | Payer: Self-pay | Source: Ambulatory Visit | Attending: Interventional Cardiology | Admitting: Interventional Cardiology

## 2013-05-24 ENCOUNTER — Encounter (HOSPITAL_COMMUNITY)
Admission: RE | Admit: 2013-05-24 | Discharge: 2013-05-24 | Disposition: A | Discharge status: Home / Self Care | Payer: Self-pay | Source: Ambulatory Visit | Attending: Interventional Cardiology | Admitting: Interventional Cardiology

## 2013-05-28 ENCOUNTER — Encounter (HOSPITAL_COMMUNITY)
Admission: RE | Admit: 2013-05-28 | Discharge: 2013-05-28 | Disposition: A | Discharge status: Home / Self Care | Payer: Self-pay | Source: Ambulatory Visit | Attending: Interventional Cardiology | Admitting: Interventional Cardiology

## 2013-05-30 ENCOUNTER — Encounter (HOSPITAL_COMMUNITY)
Admission: RE | Admit: 2013-05-30 | Discharge: 2013-05-30 | Disposition: A | Discharge status: Home / Self Care | Payer: Self-pay | Source: Ambulatory Visit | Attending: Interventional Cardiology | Admitting: Interventional Cardiology

## 2013-05-31 ENCOUNTER — Encounter (HOSPITAL_COMMUNITY)
Admission: RE | Admit: 2013-05-31 | Discharge: 2013-05-31 | Disposition: A | Discharge status: Home / Self Care | Payer: Self-pay | Source: Ambulatory Visit | Attending: Interventional Cardiology | Admitting: Interventional Cardiology

## 2013-06-04 ENCOUNTER — Encounter (HOSPITAL_COMMUNITY)
Admission: RE | Admit: 2013-06-04 | Discharge: 2013-06-04 | Disposition: A | Discharge status: Home / Self Care | Payer: Self-pay | Source: Ambulatory Visit | Attending: Interventional Cardiology | Admitting: Interventional Cardiology

## 2013-06-06 ENCOUNTER — Encounter (HOSPITAL_COMMUNITY)
Admission: RE | Admit: 2013-06-06 | Discharge: 2013-06-06 | Disposition: A | Discharge status: Home / Self Care | Payer: Self-pay | Source: Ambulatory Visit | Attending: Interventional Cardiology | Admitting: Interventional Cardiology

## 2013-06-06 DIAGNOSIS — I2510 Atherosclerotic heart disease of native coronary artery without angina pectoris: Secondary | ICD-10-CM | POA: Diagnosis not present

## 2013-06-06 DIAGNOSIS — I10 Essential (primary) hypertension: Secondary | ICD-10-CM | POA: Diagnosis not present

## 2013-06-06 DIAGNOSIS — E7800 Pure hypercholesterolemia, unspecified: Secondary | ICD-10-CM | POA: Diagnosis not present

## 2013-06-07 ENCOUNTER — Encounter (HOSPITAL_COMMUNITY): Payer: Self-pay

## 2013-06-10 ENCOUNTER — Encounter (HOSPITAL_COMMUNITY)
Admission: RE | Admit: 2013-06-10 | Discharge: 2013-06-10 | Disposition: A | Discharge status: Home / Self Care | Payer: Self-pay | Source: Ambulatory Visit | Attending: Interventional Cardiology | Admitting: Interventional Cardiology

## 2013-06-11 ENCOUNTER — Encounter (HOSPITAL_COMMUNITY)
Admission: RE | Admit: 2013-06-11 | Discharge: 2013-06-11 | Disposition: A | Discharge status: Home / Self Care | Payer: Self-pay | Source: Ambulatory Visit | Attending: Interventional Cardiology | Admitting: Interventional Cardiology

## 2013-06-11 DIAGNOSIS — Z8249 Family history of ischemic heart disease and other diseases of the circulatory system: Secondary | ICD-10-CM | POA: Insufficient documentation

## 2013-06-11 DIAGNOSIS — I2510 Atherosclerotic heart disease of native coronary artery without angina pectoris: Secondary | ICD-10-CM | POA: Insufficient documentation

## 2013-06-11 DIAGNOSIS — Z5189 Encounter for other specified aftercare: Secondary | ICD-10-CM | POA: Insufficient documentation

## 2013-06-11 DIAGNOSIS — I10 Essential (primary) hypertension: Secondary | ICD-10-CM | POA: Insufficient documentation

## 2013-06-13 ENCOUNTER — Encounter (HOSPITAL_COMMUNITY)
Admission: RE | Admit: 2013-06-13 | Discharge: 2013-06-13 | Disposition: A | Discharge status: Home / Self Care | Payer: Self-pay | Source: Ambulatory Visit | Attending: Interventional Cardiology | Admitting: Interventional Cardiology

## 2013-06-17 ENCOUNTER — Encounter (HOSPITAL_COMMUNITY)
Admission: RE | Admit: 2013-06-17 | Discharge: 2013-06-17 | Disposition: A | Discharge status: Home / Self Care | Payer: Self-pay | Source: Ambulatory Visit | Attending: Interventional Cardiology | Admitting: Interventional Cardiology

## 2013-06-18 ENCOUNTER — Encounter (HOSPITAL_COMMUNITY)
Admission: RE | Admit: 2013-06-18 | Discharge: 2013-06-18 | Disposition: A | Discharge status: Home / Self Care | Payer: Self-pay | Source: Ambulatory Visit | Attending: Interventional Cardiology | Admitting: Interventional Cardiology

## 2013-06-20 ENCOUNTER — Encounter (HOSPITAL_COMMUNITY)
Admission: RE | Admit: 2013-06-20 | Discharge: 2013-06-20 | Disposition: A | Discharge status: Home / Self Care | Payer: Self-pay | Source: Ambulatory Visit | Attending: Interventional Cardiology | Admitting: Interventional Cardiology

## 2013-06-21 ENCOUNTER — Encounter (HOSPITAL_COMMUNITY): Payer: Self-pay

## 2013-06-24 ENCOUNTER — Encounter (HOSPITAL_COMMUNITY)
Admission: RE | Admit: 2013-06-24 | Discharge: 2013-06-24 | Disposition: A | Discharge status: Home / Self Care | Payer: Self-pay | Source: Ambulatory Visit | Attending: Interventional Cardiology | Admitting: Interventional Cardiology

## 2013-06-25 ENCOUNTER — Encounter (HOSPITAL_COMMUNITY)
Admission: RE | Admit: 2013-06-25 | Discharge: 2013-06-25 | Disposition: A | Discharge status: Home / Self Care | Payer: Self-pay | Source: Ambulatory Visit | Attending: Interventional Cardiology | Admitting: Interventional Cardiology

## 2013-06-27 ENCOUNTER — Encounter (HOSPITAL_COMMUNITY): Payer: Self-pay

## 2013-06-28 ENCOUNTER — Encounter (HOSPITAL_COMMUNITY): Payer: Self-pay

## 2013-07-02 ENCOUNTER — Encounter (HOSPITAL_COMMUNITY)
Admission: RE | Admit: 2013-07-02 | Discharge: 2013-07-02 | Disposition: A | Discharge status: Home / Self Care | Payer: Self-pay | Source: Ambulatory Visit | Attending: Interventional Cardiology | Admitting: Interventional Cardiology

## 2013-07-04 ENCOUNTER — Encounter (HOSPITAL_COMMUNITY)
Admission: RE | Admit: 2013-07-04 | Discharge: 2013-07-04 | Disposition: A | Discharge status: Home / Self Care | Payer: Self-pay | Source: Ambulatory Visit | Attending: Interventional Cardiology | Admitting: Interventional Cardiology

## 2013-07-05 ENCOUNTER — Encounter (HOSPITAL_COMMUNITY)
Admission: RE | Admit: 2013-07-05 | Discharge: 2013-07-05 | Disposition: A | Discharge status: Home / Self Care | Payer: Self-pay | Source: Ambulatory Visit | Attending: Interventional Cardiology | Admitting: Interventional Cardiology

## 2013-07-09 ENCOUNTER — Encounter (HOSPITAL_COMMUNITY): Payer: Self-pay

## 2013-07-11 ENCOUNTER — Encounter (HOSPITAL_COMMUNITY)
Admission: RE | Admit: 2013-07-11 | Discharge: 2013-07-11 | Disposition: A | Discharge status: Home / Self Care | Payer: Self-pay | Source: Ambulatory Visit | Attending: Interventional Cardiology | Admitting: Interventional Cardiology

## 2013-07-12 ENCOUNTER — Encounter (HOSPITAL_COMMUNITY)
Admission: RE | Admit: 2013-07-12 | Discharge: 2013-07-12 | Disposition: A | Discharge status: Home / Self Care | Payer: Self-pay | Source: Ambulatory Visit | Attending: Interventional Cardiology | Admitting: Interventional Cardiology

## 2013-07-12 DIAGNOSIS — I2510 Atherosclerotic heart disease of native coronary artery without angina pectoris: Secondary | ICD-10-CM | POA: Insufficient documentation

## 2013-07-12 DIAGNOSIS — Z8249 Family history of ischemic heart disease and other diseases of the circulatory system: Secondary | ICD-10-CM | POA: Insufficient documentation

## 2013-07-12 DIAGNOSIS — I10 Essential (primary) hypertension: Secondary | ICD-10-CM | POA: Insufficient documentation

## 2013-07-12 DIAGNOSIS — Z5189 Encounter for other specified aftercare: Secondary | ICD-10-CM | POA: Insufficient documentation

## 2013-07-15 ENCOUNTER — Encounter (HOSPITAL_COMMUNITY)
Admission: RE | Admit: 2013-07-15 | Discharge: 2013-07-15 | Disposition: A | Discharge status: Home / Self Care | Payer: Self-pay | Source: Ambulatory Visit | Attending: Interventional Cardiology | Admitting: Interventional Cardiology

## 2013-07-16 ENCOUNTER — Encounter (HOSPITAL_COMMUNITY): Payer: Self-pay

## 2013-07-18 ENCOUNTER — Encounter (HOSPITAL_COMMUNITY)
Admission: RE | Admit: 2013-07-18 | Discharge: 2013-07-18 | Disposition: A | Discharge status: Home / Self Care | Payer: Self-pay | Source: Ambulatory Visit | Attending: Interventional Cardiology | Admitting: Interventional Cardiology

## 2013-07-19 ENCOUNTER — Encounter (HOSPITAL_COMMUNITY)
Admission: RE | Admit: 2013-07-19 | Discharge: 2013-07-19 | Disposition: A | Discharge status: Home / Self Care | Payer: Self-pay | Source: Ambulatory Visit | Attending: Interventional Cardiology | Admitting: Interventional Cardiology

## 2013-07-23 ENCOUNTER — Encounter (HOSPITAL_COMMUNITY)
Admission: RE | Admit: 2013-07-23 | Discharge: 2013-07-23 | Disposition: A | Discharge status: Home / Self Care | Payer: Self-pay | Source: Ambulatory Visit | Attending: Interventional Cardiology | Admitting: Interventional Cardiology

## 2013-07-25 ENCOUNTER — Encounter (HOSPITAL_COMMUNITY)
Admission: RE | Admit: 2013-07-25 | Discharge: 2013-07-25 | Disposition: A | Discharge status: Home / Self Care | Payer: Self-pay | Source: Ambulatory Visit | Attending: Interventional Cardiology | Admitting: Interventional Cardiology

## 2013-07-26 ENCOUNTER — Encounter (HOSPITAL_COMMUNITY)
Admission: RE | Admit: 2013-07-26 | Discharge: 2013-07-26 | Disposition: A | Discharge status: Home / Self Care | Payer: Self-pay | Source: Ambulatory Visit | Attending: Interventional Cardiology | Admitting: Interventional Cardiology

## 2013-07-30 ENCOUNTER — Encounter (HOSPITAL_COMMUNITY)
Admission: RE | Admit: 2013-07-30 | Discharge: 2013-07-30 | Disposition: A | Discharge status: Home / Self Care | Payer: Self-pay | Source: Ambulatory Visit | Attending: Interventional Cardiology | Admitting: Interventional Cardiology

## 2013-08-01 ENCOUNTER — Encounter (HOSPITAL_COMMUNITY)
Admission: RE | Admit: 2013-08-01 | Discharge: 2013-08-01 | Disposition: A | Discharge status: Home / Self Care | Payer: Self-pay | Source: Ambulatory Visit | Attending: Interventional Cardiology | Admitting: Interventional Cardiology

## 2013-08-02 ENCOUNTER — Encounter (HOSPITAL_COMMUNITY)
Admission: RE | Admit: 2013-08-02 | Discharge: 2013-08-02 | Disposition: A | Discharge status: Home / Self Care | Payer: Self-pay | Source: Ambulatory Visit | Attending: Interventional Cardiology | Admitting: Interventional Cardiology

## 2013-08-03 DIAGNOSIS — J019 Acute sinusitis, unspecified: Secondary | ICD-10-CM | POA: Diagnosis not present

## 2013-08-06 ENCOUNTER — Encounter (HOSPITAL_COMMUNITY): Payer: Self-pay

## 2013-08-08 ENCOUNTER — Encounter (HOSPITAL_COMMUNITY): Payer: Self-pay

## 2013-08-09 ENCOUNTER — Encounter (HOSPITAL_COMMUNITY): Payer: Self-pay

## 2013-08-13 ENCOUNTER — Encounter (HOSPITAL_COMMUNITY): Payer: Medicare Other

## 2013-08-13 DIAGNOSIS — I2510 Atherosclerotic heart disease of native coronary artery without angina pectoris: Secondary | ICD-10-CM | POA: Insufficient documentation

## 2013-08-13 DIAGNOSIS — I10 Essential (primary) hypertension: Secondary | ICD-10-CM | POA: Insufficient documentation

## 2013-08-13 DIAGNOSIS — Z8249 Family history of ischemic heart disease and other diseases of the circulatory system: Secondary | ICD-10-CM | POA: Insufficient documentation

## 2013-08-13 DIAGNOSIS — Z5189 Encounter for other specified aftercare: Secondary | ICD-10-CM | POA: Insufficient documentation

## 2013-08-15 ENCOUNTER — Encounter (HOSPITAL_COMMUNITY): Payer: Self-pay

## 2013-08-16 ENCOUNTER — Encounter (HOSPITAL_COMMUNITY): Admission: RE | Admit: 2013-08-16 | Payer: Self-pay | Source: Ambulatory Visit

## 2013-08-20 ENCOUNTER — Encounter (HOSPITAL_COMMUNITY): Payer: Self-pay

## 2013-08-22 ENCOUNTER — Encounter (HOSPITAL_COMMUNITY)
Admission: RE | Admit: 2013-08-22 | Discharge: 2013-08-22 | Disposition: A | Discharge status: Home / Self Care | Payer: Medicare Other | Source: Ambulatory Visit | Attending: Interventional Cardiology | Admitting: Interventional Cardiology

## 2013-08-23 ENCOUNTER — Encounter (HOSPITAL_COMMUNITY)
Admission: RE | Admit: 2013-08-23 | Discharge: 2013-08-23 | Disposition: A | Discharge status: Home / Self Care | Payer: Medicare Other | Source: Ambulatory Visit | Attending: Interventional Cardiology | Admitting: Interventional Cardiology

## 2013-08-27 ENCOUNTER — Encounter (HOSPITAL_COMMUNITY)
Admission: RE | Admit: 2013-08-27 | Discharge: 2013-08-27 | Disposition: A | Discharge status: Home / Self Care | Payer: Medicare Other | Source: Ambulatory Visit | Attending: Interventional Cardiology | Admitting: Interventional Cardiology

## 2013-08-29 ENCOUNTER — Encounter (HOSPITAL_COMMUNITY)
Admission: RE | Admit: 2013-08-29 | Discharge: 2013-08-29 | Disposition: A | Discharge status: Home / Self Care | Payer: Medicare Other | Source: Ambulatory Visit | Attending: Interventional Cardiology | Admitting: Interventional Cardiology

## 2013-08-30 ENCOUNTER — Encounter (HOSPITAL_COMMUNITY)
Admission: RE | Admit: 2013-08-30 | Discharge: 2013-08-30 | Disposition: A | Discharge status: Home / Self Care | Payer: Medicare Other | Source: Ambulatory Visit | Attending: Interventional Cardiology | Admitting: Interventional Cardiology

## 2013-09-02 DIAGNOSIS — B1710 Acute hepatitis C without hepatic coma: Secondary | ICD-10-CM | POA: Diagnosis not present

## 2013-09-02 DIAGNOSIS — Z125 Encounter for screening for malignant neoplasm of prostate: Secondary | ICD-10-CM | POA: Diagnosis not present

## 2013-09-02 DIAGNOSIS — I2510 Atherosclerotic heart disease of native coronary artery without angina pectoris: Secondary | ICD-10-CM | POA: Diagnosis not present

## 2013-09-02 DIAGNOSIS — E7800 Pure hypercholesterolemia, unspecified: Secondary | ICD-10-CM | POA: Diagnosis not present

## 2013-09-02 DIAGNOSIS — I10 Essential (primary) hypertension: Secondary | ICD-10-CM | POA: Diagnosis not present

## 2013-09-02 DIAGNOSIS — Z0000 Encounter for general adult medical examination without abnormal findings: Secondary | ICD-10-CM | POA: Diagnosis not present

## 2013-09-03 ENCOUNTER — Encounter (HOSPITAL_COMMUNITY)
Admission: RE | Admit: 2013-09-03 | Discharge: 2013-09-03 | Disposition: A | Discharge status: Home / Self Care | Payer: Medicare Other | Source: Ambulatory Visit | Attending: Interventional Cardiology | Admitting: Interventional Cardiology

## 2013-09-05 ENCOUNTER — Encounter (HOSPITAL_COMMUNITY)
Admission: RE | Admit: 2013-09-05 | Discharge: 2013-09-05 | Disposition: A | Discharge status: Home / Self Care | Payer: Medicare Other | Source: Ambulatory Visit | Attending: Interventional Cardiology | Admitting: Interventional Cardiology

## 2013-09-06 ENCOUNTER — Encounter (HOSPITAL_COMMUNITY)
Admission: RE | Admit: 2013-09-06 | Discharge: 2013-09-06 | Disposition: A | Discharge status: Home / Self Care | Payer: Medicare Other | Source: Ambulatory Visit | Attending: Interventional Cardiology | Admitting: Interventional Cardiology

## 2013-09-07 ENCOUNTER — Other Ambulatory Visit: Payer: Self-pay | Admitting: Interventional Cardiology

## 2013-09-07 DIAGNOSIS — E7800 Pure hypercholesterolemia, unspecified: Secondary | ICD-10-CM

## 2013-09-07 DIAGNOSIS — Z79899 Other long term (current) drug therapy: Secondary | ICD-10-CM

## 2013-09-10 ENCOUNTER — Encounter (HOSPITAL_COMMUNITY)
Admission: RE | Admit: 2013-09-10 | Discharge: 2013-09-10 | Disposition: A | Discharge status: Home / Self Care | Payer: Medicare Other | Source: Ambulatory Visit | Attending: Interventional Cardiology | Admitting: Interventional Cardiology

## 2013-09-12 ENCOUNTER — Encounter (HOSPITAL_COMMUNITY)
Admission: RE | Admit: 2013-09-12 | Discharge: 2013-09-12 | Disposition: A | Discharge status: Home / Self Care | Payer: Medicare Other | Source: Ambulatory Visit | Attending: Interventional Cardiology | Admitting: Interventional Cardiology

## 2013-09-12 DIAGNOSIS — I2510 Atherosclerotic heart disease of native coronary artery without angina pectoris: Secondary | ICD-10-CM | POA: Insufficient documentation

## 2013-09-12 DIAGNOSIS — I10 Essential (primary) hypertension: Secondary | ICD-10-CM | POA: Insufficient documentation

## 2013-09-12 DIAGNOSIS — Z8249 Family history of ischemic heart disease and other diseases of the circulatory system: Secondary | ICD-10-CM | POA: Insufficient documentation

## 2013-09-12 DIAGNOSIS — Z5189 Encounter for other specified aftercare: Secondary | ICD-10-CM | POA: Insufficient documentation

## 2013-09-13 ENCOUNTER — Encounter (HOSPITAL_COMMUNITY)
Admission: RE | Admit: 2013-09-13 | Discharge: 2013-09-13 | Disposition: A | Discharge status: Home / Self Care | Payer: Medicare Other | Source: Ambulatory Visit | Attending: Interventional Cardiology | Admitting: Interventional Cardiology

## 2013-09-13 DIAGNOSIS — Z23 Encounter for immunization: Secondary | ICD-10-CM | POA: Diagnosis not present

## 2013-09-16 ENCOUNTER — Encounter (HOSPITAL_COMMUNITY)
Admission: RE | Admit: 2013-09-16 | Discharge: 2013-09-16 | Disposition: A | Discharge status: Home / Self Care | Payer: Medicare Other | Source: Ambulatory Visit | Attending: Interventional Cardiology | Admitting: Interventional Cardiology

## 2013-09-16 DIAGNOSIS — L84 Corns and callosities: Secondary | ICD-10-CM

## 2013-09-17 ENCOUNTER — Encounter (HOSPITAL_COMMUNITY)
Admission: RE | Admit: 2013-09-17 | Discharge: 2013-09-17 | Disposition: A | Discharge status: Home / Self Care | Payer: Medicare Other | Source: Ambulatory Visit | Attending: Interventional Cardiology | Admitting: Interventional Cardiology

## 2013-09-19 ENCOUNTER — Encounter (HOSPITAL_COMMUNITY)
Admission: RE | Admit: 2013-09-19 | Discharge: 2013-09-19 | Disposition: A | Discharge status: Home / Self Care | Payer: Medicare Other | Source: Ambulatory Visit | Attending: Interventional Cardiology | Admitting: Interventional Cardiology

## 2013-09-20 ENCOUNTER — Encounter (HOSPITAL_COMMUNITY): Payer: Self-pay

## 2013-09-23 DIAGNOSIS — L639 Alopecia areata, unspecified: Secondary | ICD-10-CM | POA: Diagnosis not present

## 2013-09-23 DIAGNOSIS — L570 Actinic keratosis: Secondary | ICD-10-CM | POA: Diagnosis not present

## 2013-09-24 ENCOUNTER — Encounter (HOSPITAL_COMMUNITY)
Admission: RE | Admit: 2013-09-24 | Discharge: 2013-09-24 | Disposition: A | Discharge status: Home / Self Care | Payer: Medicare Other | Source: Ambulatory Visit | Attending: Interventional Cardiology | Admitting: Interventional Cardiology

## 2013-09-25 ENCOUNTER — Telehealth: Payer: Self-pay

## 2013-09-25 ENCOUNTER — Encounter (HOSPITAL_COMMUNITY)
Admission: RE | Admit: 2013-09-25 | Discharge: 2013-09-25 | Disposition: A | Discharge status: Home / Self Care | Payer: Medicare Other | Source: Ambulatory Visit | Attending: Interventional Cardiology | Admitting: Interventional Cardiology

## 2013-09-25 ENCOUNTER — Other Ambulatory Visit (INDEPENDENT_AMBULATORY_CARE_PROVIDER_SITE_OTHER): Payer: Medicare Other

## 2013-09-25 DIAGNOSIS — E7800 Pure hypercholesterolemia, unspecified: Secondary | ICD-10-CM

## 2013-09-25 DIAGNOSIS — Z79899 Other long term (current) drug therapy: Secondary | ICD-10-CM

## 2013-09-25 LAB — LIPID PANEL
HDL: 63.4 mg/dL (ref >39.00)
LDL Cholesterol: 86 mg/dL (ref 0–99)
Total CHOL/HDL Ratio: 3
VLDL: 19.6 mg/dL (ref 0.0–40.0)

## 2013-09-25 LAB — ALT: ALT: 27 U/L (ref 0–53)

## 2013-09-25 NOTE — Telephone Encounter (Signed)
Cardiac rehab form faxed 

## 2013-09-26 ENCOUNTER — Telehealth: Payer: Self-pay

## 2013-09-26 ENCOUNTER — Encounter (HOSPITAL_COMMUNITY)
Admission: RE | Admit: 2013-09-26 | Discharge: 2013-09-26 | Disposition: A | Discharge status: Home / Self Care | Payer: Self-pay | Source: Ambulatory Visit | Attending: Interventional Cardiology | Admitting: Interventional Cardiology

## 2013-09-26 ENCOUNTER — Other Ambulatory Visit: Payer: Self-pay

## 2013-09-26 DIAGNOSIS — E785 Hyperlipidemia, unspecified: Secondary | ICD-10-CM

## 2013-09-26 NOTE — Telephone Encounter (Signed)
lmom. pt labs ok. pt can return call to discuss if needed. Mailed labs

## 2013-09-26 NOTE — Telephone Encounter (Signed)
Message copied by Jarvis Newcomer on Thu Sep 26, 2013 10:51 AM ------      Message from: Verdis Prime      Created: Wed Sep 25, 2013  4:17 PM       At target and we need to repeat liver and Lipid panel in 1 year. 272.4 ------

## 2013-09-27 ENCOUNTER — Encounter (HOSPITAL_COMMUNITY): Payer: Self-pay

## 2013-10-01 ENCOUNTER — Telehealth: Payer: Self-pay | Admitting: Interventional Cardiology

## 2013-10-01 ENCOUNTER — Encounter (HOSPITAL_COMMUNITY)
Admission: RE | Admit: 2013-10-01 | Discharge: 2013-10-01 | Disposition: A | Discharge status: Home / Self Care | Payer: Self-pay | Source: Ambulatory Visit | Attending: Interventional Cardiology | Admitting: Interventional Cardiology

## 2013-10-01 NOTE — Telephone Encounter (Signed)
New Problem  Pt recently had a test and was advised to give Riki Rusk a call back to discuss results// please call

## 2013-10-01 NOTE — Telephone Encounter (Signed)
Patient wanted to know if his cholesterol results were okay since I had been working with him in the past.  Patient on atoravastatin 10 mg qd, and this is the first daily statin and dose he has been able to tolerate for a long time.  LDL < 100 mg/dL currently (~ 86 mg/dL) on recent lab.  Patient has h/o CAD.  Given his h/o muscle aches on statins and Zetia, would not change statin/dose, and agree with Dr. Michaelle Copas recommendations to continue lipitor 10 mg qd and recheck lipid / liver in 1 year.  Patient also wanted to know if atorvastatin can cause transient hair loss, where he would lose hair for 2 days, then it grow back soon after.  I've never seen this, and dermatologist told him the same.  No changes needed.

## 2013-10-03 ENCOUNTER — Encounter (HOSPITAL_COMMUNITY)
Admission: RE | Admit: 2013-10-03 | Discharge: 2013-10-03 | Disposition: A | Discharge status: Home / Self Care | Payer: Self-pay | Source: Ambulatory Visit | Attending: Interventional Cardiology | Admitting: Interventional Cardiology

## 2013-10-04 ENCOUNTER — Encounter (HOSPITAL_COMMUNITY)
Admission: RE | Admit: 2013-10-04 | Discharge: 2013-10-04 | Disposition: A | Discharge status: Home / Self Care | Payer: Self-pay | Source: Ambulatory Visit | Attending: Interventional Cardiology | Admitting: Interventional Cardiology

## 2013-10-08 ENCOUNTER — Encounter (HOSPITAL_COMMUNITY): Payer: Self-pay

## 2013-10-10 ENCOUNTER — Encounter (HOSPITAL_COMMUNITY)
Admission: RE | Admit: 2013-10-10 | Discharge: 2013-10-10 | Disposition: A | Discharge status: Home / Self Care | Payer: Self-pay | Source: Ambulatory Visit | Attending: Interventional Cardiology | Admitting: Interventional Cardiology

## 2013-10-11 ENCOUNTER — Encounter (HOSPITAL_COMMUNITY)
Admission: RE | Admit: 2013-10-11 | Discharge: 2013-10-11 | Disposition: A | Discharge status: Home / Self Care | Payer: Self-pay | Source: Ambulatory Visit | Attending: Interventional Cardiology | Admitting: Interventional Cardiology

## 2013-10-15 ENCOUNTER — Encounter (HOSPITAL_COMMUNITY)
Admission: RE | Admit: 2013-10-15 | Discharge: 2013-10-15 | Disposition: A | Discharge status: Home / Self Care | Payer: Self-pay | Source: Ambulatory Visit | Attending: Interventional Cardiology | Admitting: Interventional Cardiology

## 2013-10-15 DIAGNOSIS — Z5189 Encounter for other specified aftercare: Secondary | ICD-10-CM | POA: Insufficient documentation

## 2013-10-15 DIAGNOSIS — I2510 Atherosclerotic heart disease of native coronary artery without angina pectoris: Secondary | ICD-10-CM | POA: Insufficient documentation

## 2013-10-15 DIAGNOSIS — Z8249 Family history of ischemic heart disease and other diseases of the circulatory system: Secondary | ICD-10-CM | POA: Insufficient documentation

## 2013-10-15 DIAGNOSIS — I10 Essential (primary) hypertension: Secondary | ICD-10-CM | POA: Insufficient documentation

## 2013-10-17 ENCOUNTER — Encounter (HOSPITAL_COMMUNITY)
Admission: RE | Admit: 2013-10-17 | Discharge: 2013-10-17 | Disposition: A | Discharge status: Home / Self Care | Payer: Self-pay | Source: Ambulatory Visit | Attending: Interventional Cardiology | Admitting: Interventional Cardiology

## 2013-10-18 ENCOUNTER — Encounter (HOSPITAL_COMMUNITY)
Admission: RE | Admit: 2013-10-18 | Discharge: 2013-10-18 | Disposition: A | Discharge status: Home / Self Care | Payer: Self-pay | Source: Ambulatory Visit | Attending: Interventional Cardiology | Admitting: Interventional Cardiology

## 2013-10-22 ENCOUNTER — Encounter (HOSPITAL_COMMUNITY)
Admission: RE | Admit: 2013-10-22 | Discharge: 2013-10-22 | Disposition: A | Discharge status: Home / Self Care | Payer: Self-pay | Source: Ambulatory Visit | Attending: Interventional Cardiology | Admitting: Interventional Cardiology

## 2013-10-24 ENCOUNTER — Encounter (HOSPITAL_COMMUNITY)
Admission: RE | Admit: 2013-10-24 | Discharge: 2013-10-24 | Disposition: A | Discharge status: Home / Self Care | Payer: Self-pay | Source: Ambulatory Visit | Attending: Interventional Cardiology | Admitting: Interventional Cardiology

## 2013-10-25 ENCOUNTER — Encounter (HOSPITAL_COMMUNITY)
Admission: RE | Admit: 2013-10-25 | Discharge: 2013-10-25 | Disposition: A | Discharge status: Home / Self Care | Payer: Self-pay | Source: Ambulatory Visit | Attending: Interventional Cardiology | Admitting: Interventional Cardiology

## 2013-10-28 DIAGNOSIS — G5760 Lesion of plantar nerve, unspecified lower limb: Secondary | ICD-10-CM | POA: Diagnosis not present

## 2013-10-28 DIAGNOSIS — IMO0002 Reserved for concepts with insufficient information to code with codable children: Secondary | ICD-10-CM | POA: Diagnosis not present

## 2013-10-29 ENCOUNTER — Encounter (HOSPITAL_COMMUNITY): Payer: Self-pay

## 2013-10-29 DIAGNOSIS — D1801 Hemangioma of skin and subcutaneous tissue: Secondary | ICD-10-CM | POA: Diagnosis not present

## 2013-10-29 DIAGNOSIS — Z85820 Personal history of malignant melanoma of skin: Secondary | ICD-10-CM | POA: Diagnosis not present

## 2013-10-29 DIAGNOSIS — L80 Vitiligo: Secondary | ICD-10-CM | POA: Diagnosis not present

## 2013-10-29 DIAGNOSIS — D2370 Other benign neoplasm of skin of unspecified lower limb, including hip: Secondary | ICD-10-CM | POA: Diagnosis not present

## 2013-10-29 DIAGNOSIS — L570 Actinic keratosis: Secondary | ICD-10-CM | POA: Diagnosis not present

## 2013-10-29 DIAGNOSIS — L819 Disorder of pigmentation, unspecified: Secondary | ICD-10-CM | POA: Diagnosis not present

## 2013-10-29 DIAGNOSIS — L821 Other seborrheic keratosis: Secondary | ICD-10-CM | POA: Diagnosis not present

## 2013-10-29 DIAGNOSIS — L719 Rosacea, unspecified: Secondary | ICD-10-CM | POA: Diagnosis not present

## 2013-10-31 ENCOUNTER — Encounter (HOSPITAL_COMMUNITY): Payer: Self-pay

## 2013-11-01 ENCOUNTER — Encounter (HOSPITAL_COMMUNITY): Payer: Self-pay

## 2013-11-05 ENCOUNTER — Encounter (HOSPITAL_COMMUNITY)
Admission: RE | Admit: 2013-11-05 | Discharge: 2013-11-05 | Disposition: A | Discharge status: Home / Self Care | Payer: Self-pay | Source: Ambulatory Visit | Attending: Interventional Cardiology | Admitting: Interventional Cardiology

## 2013-11-06 ENCOUNTER — Encounter (HOSPITAL_COMMUNITY)
Admission: RE | Admit: 2013-11-06 | Discharge: 2013-11-06 | Disposition: A | Discharge status: Home / Self Care | Payer: Self-pay | Source: Ambulatory Visit | Attending: Interventional Cardiology | Admitting: Interventional Cardiology

## 2013-11-12 ENCOUNTER — Encounter (HOSPITAL_COMMUNITY)
Admission: RE | Admit: 2013-11-12 | Discharge: 2013-11-12 | Disposition: A | Discharge status: Home / Self Care | Payer: Self-pay | Source: Ambulatory Visit | Attending: Interventional Cardiology | Admitting: Interventional Cardiology

## 2013-11-12 DIAGNOSIS — Z8249 Family history of ischemic heart disease and other diseases of the circulatory system: Secondary | ICD-10-CM | POA: Insufficient documentation

## 2013-11-12 DIAGNOSIS — R1900 Intra-abdominal and pelvic swelling, mass and lump, unspecified site: Secondary | ICD-10-CM | POA: Diagnosis not present

## 2013-11-12 DIAGNOSIS — I2510 Atherosclerotic heart disease of native coronary artery without angina pectoris: Secondary | ICD-10-CM | POA: Insufficient documentation

## 2013-11-12 DIAGNOSIS — I10 Essential (primary) hypertension: Secondary | ICD-10-CM | POA: Insufficient documentation

## 2013-11-12 DIAGNOSIS — Z5189 Encounter for other specified aftercare: Secondary | ICD-10-CM | POA: Insufficient documentation

## 2013-11-14 ENCOUNTER — Encounter (HOSPITAL_COMMUNITY): Payer: Self-pay

## 2013-11-15 ENCOUNTER — Encounter (HOSPITAL_COMMUNITY)
Admission: RE | Admit: 2013-11-15 | Discharge: 2013-11-15 | Disposition: A | Discharge status: Home / Self Care | Payer: Self-pay | Source: Ambulatory Visit | Attending: Interventional Cardiology | Admitting: Interventional Cardiology

## 2013-11-19 ENCOUNTER — Encounter (HOSPITAL_COMMUNITY)
Admission: RE | Admit: 2013-11-19 | Discharge: 2013-11-19 | Disposition: A | Discharge status: Home / Self Care | Payer: Self-pay | Source: Ambulatory Visit | Attending: Interventional Cardiology | Admitting: Interventional Cardiology

## 2013-11-21 ENCOUNTER — Encounter (HOSPITAL_COMMUNITY)
Admission: RE | Admit: 2013-11-21 | Discharge: 2013-11-21 | Disposition: A | Discharge status: Home / Self Care | Payer: Self-pay | Source: Ambulatory Visit | Attending: Interventional Cardiology | Admitting: Interventional Cardiology

## 2013-11-22 ENCOUNTER — Encounter (HOSPITAL_COMMUNITY)
Admission: RE | Admit: 2013-11-22 | Discharge: 2013-11-22 | Disposition: A | Discharge status: Home / Self Care | Payer: Self-pay | Source: Ambulatory Visit | Attending: Interventional Cardiology | Admitting: Interventional Cardiology

## 2013-11-26 ENCOUNTER — Encounter (HOSPITAL_COMMUNITY)
Admission: RE | Admit: 2013-11-26 | Discharge: 2013-11-26 | Disposition: A | Discharge status: Home / Self Care | Payer: Self-pay | Source: Ambulatory Visit | Attending: Interventional Cardiology | Admitting: Interventional Cardiology

## 2013-11-28 ENCOUNTER — Encounter (HOSPITAL_COMMUNITY)
Admission: RE | Admit: 2013-11-28 | Discharge: 2013-11-28 | Disposition: A | Discharge status: Home / Self Care | Payer: Self-pay | Source: Ambulatory Visit | Attending: Interventional Cardiology | Admitting: Interventional Cardiology

## 2013-11-29 ENCOUNTER — Encounter (HOSPITAL_COMMUNITY)
Admission: RE | Admit: 2013-11-29 | Discharge: 2013-11-29 | Disposition: A | Discharge status: Home / Self Care | Payer: Self-pay | Source: Ambulatory Visit | Attending: Interventional Cardiology | Admitting: Interventional Cardiology

## 2013-12-03 ENCOUNTER — Encounter (HOSPITAL_COMMUNITY): Payer: Self-pay

## 2013-12-10 ENCOUNTER — Encounter (HOSPITAL_COMMUNITY)
Admission: RE | Admit: 2013-12-10 | Discharge: 2013-12-10 | Disposition: A | Discharge status: Home / Self Care | Payer: Self-pay | Source: Ambulatory Visit | Attending: Interventional Cardiology | Admitting: Interventional Cardiology

## 2013-12-11 ENCOUNTER — Encounter (HOSPITAL_COMMUNITY)
Admission: RE | Admit: 2013-12-11 | Discharge: 2013-12-11 | Disposition: A | Discharge status: Home / Self Care | Payer: Self-pay | Source: Ambulatory Visit | Attending: Interventional Cardiology | Admitting: Interventional Cardiology

## 2013-12-13 ENCOUNTER — Encounter (HOSPITAL_COMMUNITY): Payer: Medicare Other

## 2013-12-13 DIAGNOSIS — Z8249 Family history of ischemic heart disease and other diseases of the circulatory system: Secondary | ICD-10-CM | POA: Insufficient documentation

## 2013-12-13 DIAGNOSIS — I2510 Atherosclerotic heart disease of native coronary artery without angina pectoris: Secondary | ICD-10-CM | POA: Insufficient documentation

## 2013-12-13 DIAGNOSIS — I10 Essential (primary) hypertension: Secondary | ICD-10-CM | POA: Insufficient documentation

## 2013-12-13 DIAGNOSIS — Z5189 Encounter for other specified aftercare: Secondary | ICD-10-CM | POA: Insufficient documentation

## 2013-12-17 ENCOUNTER — Encounter (HOSPITAL_COMMUNITY)
Admission: RE | Admit: 2013-12-17 | Discharge: 2013-12-17 | Disposition: A | Discharge status: Home / Self Care | Payer: Self-pay | Source: Ambulatory Visit | Attending: Interventional Cardiology | Admitting: Interventional Cardiology

## 2013-12-19 ENCOUNTER — Encounter (HOSPITAL_COMMUNITY)
Admission: RE | Admit: 2013-12-19 | Discharge: 2013-12-19 | Disposition: A | Discharge status: Home / Self Care | Payer: Self-pay | Source: Ambulatory Visit | Attending: Interventional Cardiology | Admitting: Interventional Cardiology

## 2013-12-20 ENCOUNTER — Encounter (HOSPITAL_COMMUNITY)
Admission: RE | Admit: 2013-12-20 | Discharge: 2013-12-20 | Disposition: A | Discharge status: Home / Self Care | Payer: Self-pay | Source: Ambulatory Visit | Attending: Interventional Cardiology | Admitting: Interventional Cardiology

## 2013-12-24 ENCOUNTER — Encounter (HOSPITAL_COMMUNITY)
Admission: RE | Admit: 2013-12-24 | Discharge: 2013-12-24 | Disposition: A | Discharge status: Home / Self Care | Payer: Self-pay | Source: Ambulatory Visit | Attending: Interventional Cardiology | Admitting: Interventional Cardiology

## 2013-12-26 ENCOUNTER — Encounter (HOSPITAL_COMMUNITY)
Admission: RE | Admit: 2013-12-26 | Discharge: 2013-12-26 | Disposition: A | Discharge status: Home / Self Care | Payer: Self-pay | Source: Ambulatory Visit | Attending: Interventional Cardiology | Admitting: Interventional Cardiology

## 2013-12-27 ENCOUNTER — Encounter (HOSPITAL_COMMUNITY)
Admission: RE | Admit: 2013-12-27 | Discharge: 2013-12-27 | Disposition: A | Discharge status: Home / Self Care | Payer: Self-pay | Source: Ambulatory Visit | Attending: Interventional Cardiology | Admitting: Interventional Cardiology

## 2013-12-31 ENCOUNTER — Encounter (HOSPITAL_COMMUNITY)
Admission: RE | Admit: 2013-12-31 | Discharge: 2013-12-31 | Disposition: A | Discharge status: Home / Self Care | Payer: Self-pay | Source: Ambulatory Visit | Attending: Interventional Cardiology | Admitting: Interventional Cardiology

## 2014-01-02 ENCOUNTER — Encounter (HOSPITAL_COMMUNITY)
Admission: RE | Admit: 2014-01-02 | Discharge: 2014-01-02 | Disposition: A | Discharge status: Home / Self Care | Payer: Self-pay | Source: Ambulatory Visit | Attending: Interventional Cardiology | Admitting: Interventional Cardiology

## 2014-01-03 ENCOUNTER — Encounter (HOSPITAL_COMMUNITY)
Admission: RE | Admit: 2014-01-03 | Discharge: 2014-01-03 | Disposition: A | Discharge status: Home / Self Care | Payer: Self-pay | Source: Ambulatory Visit | Attending: Interventional Cardiology | Admitting: Interventional Cardiology

## 2014-01-07 ENCOUNTER — Encounter (HOSPITAL_COMMUNITY)
Admission: RE | Admit: 2014-01-07 | Discharge: 2014-01-07 | Disposition: A | Discharge status: Home / Self Care | Payer: Self-pay | Source: Ambulatory Visit | Attending: Interventional Cardiology | Admitting: Interventional Cardiology

## 2014-01-09 ENCOUNTER — Encounter (HOSPITAL_COMMUNITY)
Admission: RE | Admit: 2014-01-09 | Discharge: 2014-01-09 | Disposition: A | Discharge status: Home / Self Care | Payer: Self-pay | Source: Ambulatory Visit | Attending: Interventional Cardiology | Admitting: Interventional Cardiology

## 2014-01-10 ENCOUNTER — Encounter (HOSPITAL_COMMUNITY)
Admission: RE | Admit: 2014-01-10 | Discharge: 2014-01-10 | Disposition: A | Discharge status: Home / Self Care | Payer: Self-pay | Source: Ambulatory Visit | Attending: Interventional Cardiology | Admitting: Interventional Cardiology

## 2014-01-14 ENCOUNTER — Encounter (HOSPITAL_COMMUNITY)
Admission: RE | Admit: 2014-01-14 | Discharge: 2014-01-14 | Disposition: A | Discharge status: Home / Self Care | Payer: Self-pay | Source: Ambulatory Visit | Attending: Interventional Cardiology | Admitting: Interventional Cardiology

## 2014-01-14 DIAGNOSIS — Z8249 Family history of ischemic heart disease and other diseases of the circulatory system: Secondary | ICD-10-CM | POA: Insufficient documentation

## 2014-01-14 DIAGNOSIS — Z5189 Encounter for other specified aftercare: Secondary | ICD-10-CM | POA: Insufficient documentation

## 2014-01-14 DIAGNOSIS — I2510 Atherosclerotic heart disease of native coronary artery without angina pectoris: Secondary | ICD-10-CM | POA: Insufficient documentation

## 2014-01-14 DIAGNOSIS — I10 Essential (primary) hypertension: Secondary | ICD-10-CM | POA: Insufficient documentation

## 2014-01-16 ENCOUNTER — Encounter (HOSPITAL_COMMUNITY): Payer: Medicare Other

## 2014-01-17 ENCOUNTER — Encounter (HOSPITAL_COMMUNITY)
Admission: RE | Admit: 2014-01-17 | Discharge: 2014-01-17 | Disposition: A | Discharge status: Home / Self Care | Payer: Self-pay | Source: Ambulatory Visit | Attending: Interventional Cardiology | Admitting: Interventional Cardiology

## 2014-01-21 ENCOUNTER — Encounter (HOSPITAL_COMMUNITY)
Admission: RE | Admit: 2014-01-21 | Discharge: 2014-01-21 | Disposition: A | Discharge status: Home / Self Care | Payer: Self-pay | Source: Ambulatory Visit | Attending: Interventional Cardiology | Admitting: Interventional Cardiology

## 2014-01-23 ENCOUNTER — Encounter (HOSPITAL_COMMUNITY): Payer: Medicare Other

## 2014-01-24 ENCOUNTER — Encounter (HOSPITAL_COMMUNITY): Payer: Medicare Other

## 2014-01-28 ENCOUNTER — Encounter (HOSPITAL_COMMUNITY): Payer: Medicare Other

## 2014-01-30 ENCOUNTER — Encounter (HOSPITAL_COMMUNITY): Payer: Medicare Other

## 2014-01-31 ENCOUNTER — Encounter (HOSPITAL_COMMUNITY): Payer: Medicare Other

## 2014-02-04 ENCOUNTER — Encounter (HOSPITAL_COMMUNITY): Payer: Medicare Other

## 2014-02-06 ENCOUNTER — Encounter (HOSPITAL_COMMUNITY): Payer: Medicare Other

## 2014-02-07 ENCOUNTER — Encounter (HOSPITAL_COMMUNITY): Payer: Medicare Other

## 2014-02-11 ENCOUNTER — Ambulatory Visit (INDEPENDENT_AMBULATORY_CARE_PROVIDER_SITE_OTHER): Payer: Medicare Other | Admitting: *Deleted

## 2014-02-11 ENCOUNTER — Encounter (HOSPITAL_COMMUNITY)
Admission: RE | Admit: 2014-02-11 | Discharge: 2014-02-11 | Disposition: A | Discharge status: Home / Self Care | Payer: Self-pay | Source: Ambulatory Visit | Attending: Interventional Cardiology | Admitting: Interventional Cardiology

## 2014-02-11 DIAGNOSIS — E785 Hyperlipidemia, unspecified: Secondary | ICD-10-CM | POA: Diagnosis not present

## 2014-02-11 DIAGNOSIS — I10 Essential (primary) hypertension: Secondary | ICD-10-CM | POA: Insufficient documentation

## 2014-02-11 DIAGNOSIS — Z8249 Family history of ischemic heart disease and other diseases of the circulatory system: Secondary | ICD-10-CM | POA: Insufficient documentation

## 2014-02-11 DIAGNOSIS — I2510 Atherosclerotic heart disease of native coronary artery without angina pectoris: Secondary | ICD-10-CM | POA: Insufficient documentation

## 2014-02-11 DIAGNOSIS — Z5189 Encounter for other specified aftercare: Secondary | ICD-10-CM | POA: Insufficient documentation

## 2014-02-11 LAB — LIPID PANEL
CHOLESTEROL: 154 mg/dL (ref 0–200)
HDL: 53.9 mg/dL (ref >39.00)
LDL CALC: 84 mg/dL (ref 0–99)
TRIGLYCERIDES: 81 mg/dL (ref 0.0–149.0)
Total CHOL/HDL Ratio: 3
VLDL: 16.2 mg/dL (ref 0.0–40.0)

## 2014-02-11 LAB — ALT: ALT: 24 U/L (ref 0–53)

## 2014-02-13 ENCOUNTER — Encounter (HOSPITAL_COMMUNITY)
Admission: RE | Admit: 2014-02-13 | Discharge: 2014-02-13 | Disposition: A | Discharge status: Home / Self Care | Payer: Self-pay | Source: Ambulatory Visit | Attending: Interventional Cardiology | Admitting: Interventional Cardiology

## 2014-02-14 ENCOUNTER — Encounter (HOSPITAL_COMMUNITY)
Admission: RE | Admit: 2014-02-14 | Discharge: 2014-02-14 | Disposition: A | Discharge status: Home / Self Care | Payer: Self-pay | Source: Ambulatory Visit | Attending: Interventional Cardiology | Admitting: Interventional Cardiology

## 2014-02-18 ENCOUNTER — Telehealth: Payer: Self-pay | Admitting: Interventional Cardiology

## 2014-02-18 ENCOUNTER — Encounter (HOSPITAL_COMMUNITY)
Admission: RE | Admit: 2014-02-18 | Discharge: 2014-02-18 | Disposition: A | Discharge status: Home / Self Care | Payer: Self-pay | Source: Ambulatory Visit | Attending: Interventional Cardiology | Admitting: Interventional Cardiology

## 2014-02-18 DIAGNOSIS — E785 Hyperlipidemia, unspecified: Secondary | ICD-10-CM

## 2014-02-18 NOTE — Telephone Encounter (Signed)
New message  Patient is calling for test results, please call and advise.

## 2014-02-20 ENCOUNTER — Encounter (HOSPITAL_COMMUNITY)
Admission: RE | Admit: 2014-02-20 | Discharge: 2014-02-20 | Disposition: A | Discharge status: Home / Self Care | Payer: Self-pay | Source: Ambulatory Visit | Attending: Interventional Cardiology | Admitting: Interventional Cardiology

## 2014-02-20 NOTE — Telephone Encounter (Signed)
returned pt call.pt given lab results.At target. Recheck 1year.pt rsqt a copy of labs be mailed.done.pt verbalized understanding.

## 2014-02-20 NOTE — Telephone Encounter (Signed)
Message copied by Lamar Laundry on Thu Feb 20, 2014 12:59 PM ------      Message from: Daneen Schick      Created: Sat Feb 15, 2014 10:38 AM       At target. Recheck 1year ------

## 2014-02-21 ENCOUNTER — Encounter (HOSPITAL_COMMUNITY)
Admission: RE | Admit: 2014-02-21 | Discharge: 2014-02-21 | Disposition: A | Discharge status: Home / Self Care | Payer: Self-pay | Source: Ambulatory Visit | Attending: Interventional Cardiology | Admitting: Interventional Cardiology

## 2014-02-25 ENCOUNTER — Encounter (HOSPITAL_COMMUNITY)
Admission: RE | Admit: 2014-02-25 | Discharge: 2014-02-25 | Disposition: A | Discharge status: Home / Self Care | Payer: Self-pay | Source: Ambulatory Visit | Attending: Interventional Cardiology | Admitting: Interventional Cardiology

## 2014-02-27 ENCOUNTER — Encounter (HOSPITAL_COMMUNITY)
Admission: RE | Admit: 2014-02-27 | Discharge: 2014-02-27 | Disposition: A | Discharge status: Home / Self Care | Payer: Self-pay | Source: Ambulatory Visit | Attending: Interventional Cardiology | Admitting: Interventional Cardiology

## 2014-02-28 ENCOUNTER — Encounter (HOSPITAL_COMMUNITY)
Admission: RE | Admit: 2014-02-28 | Discharge: 2014-02-28 | Disposition: A | Discharge status: Home / Self Care | Payer: Self-pay | Source: Ambulatory Visit | Attending: Interventional Cardiology | Admitting: Interventional Cardiology

## 2014-03-04 ENCOUNTER — Encounter (HOSPITAL_COMMUNITY)
Admission: RE | Admit: 2014-03-04 | Discharge: 2014-03-04 | Disposition: A | Discharge status: Home / Self Care | Payer: Self-pay | Source: Ambulatory Visit | Attending: Interventional Cardiology | Admitting: Interventional Cardiology

## 2014-03-06 ENCOUNTER — Encounter (HOSPITAL_COMMUNITY): Payer: Medicare Other

## 2014-03-07 ENCOUNTER — Encounter (HOSPITAL_COMMUNITY)
Admission: RE | Admit: 2014-03-07 | Discharge: 2014-03-07 | Disposition: A | Discharge status: Home / Self Care | Payer: Self-pay | Source: Ambulatory Visit | Attending: Interventional Cardiology | Admitting: Interventional Cardiology

## 2014-03-10 ENCOUNTER — Ambulatory Visit (INDEPENDENT_AMBULATORY_CARE_PROVIDER_SITE_OTHER): Payer: Federal, State, Local not specified - PPO | Admitting: Surgery

## 2014-03-11 ENCOUNTER — Encounter (HOSPITAL_COMMUNITY)
Admission: RE | Admit: 2014-03-11 | Discharge: 2014-03-11 | Disposition: A | Discharge status: Home / Self Care | Payer: Self-pay | Source: Ambulatory Visit | Attending: Interventional Cardiology | Admitting: Interventional Cardiology

## 2014-03-12 ENCOUNTER — Encounter (INDEPENDENT_AMBULATORY_CARE_PROVIDER_SITE_OTHER): Payer: Self-pay

## 2014-03-12 ENCOUNTER — Encounter (INDEPENDENT_AMBULATORY_CARE_PROVIDER_SITE_OTHER): Payer: Self-pay | Admitting: Surgery

## 2014-03-12 ENCOUNTER — Telehealth: Payer: Self-pay | Admitting: Interventional Cardiology

## 2014-03-12 ENCOUNTER — Ambulatory Visit (INDEPENDENT_AMBULATORY_CARE_PROVIDER_SITE_OTHER): Payer: Medicare Other | Admitting: Surgery

## 2014-03-12 VITALS — BP 142/82 | HR 80 | Temp 98.5°F | Resp 16 | Ht 69.0 in | Wt 177.8 lb

## 2014-03-12 DIAGNOSIS — K4090 Unilateral inguinal hernia, without obstruction or gangrene, not specified as recurrent: Secondary | ICD-10-CM | POA: Diagnosis not present

## 2014-03-12 NOTE — Telephone Encounter (Signed)
New message    Need cardiac clearance for hernia repair.    Fax request coming from Dr. Lucia Gaskins office.

## 2014-03-12 NOTE — Progress Notes (Addendum)
Re:   Nathan [name] DOB:   01/01/1946 MRN:   [id number]  ASSESSMENT AND PLAN: 1.  Left inguinal hernia  I discussed the indications and complications of hernia surgery with the patient.  I discussed both the laparoscopic and open approach to hernia repair..  The potential risks of hernia surgery include, but are not limited to, bleeding, infection, open surgery, nerve injury, and recurrence of the hernia.  I provided the patient literature about hernia surgery.  Because he travels, he is not interested in observing this hernia.  At this time, we are going ahead with an open left inguinal hernia.  2.  HTN 3.  History of cervical and lumbar disk disease 4.  CAD  CABG x 3 - 06/24/2004 - Dr. Ricard Dillon  He has been stable and doing well.  Followed by Dr. Linard Millers - will need card clearance. [Nathan [name] has done very well from the Cardiovascular viewpoint since CAB surgery. Unless there are new symptoms worrisome for angina or heart failure, he is CLEARED for the upcoming hernia procedure. Illene Labrador, III, MD - DN 03/13/2014]  5.  Neck surgery  2004 - [place]   Chief Complaint  Patient presents with  . eval lih    new pt   REFERRING PHYSICIAN: Garlan Fair, MD  HISTORY OF PRESENT ILLNESS: Nathan [name] is a 68 y.o. (DOB: 10/04/1946)  white  male whose primary care physician is Garlan Fair, MD and comes to me today for left inguinal hernia. He comes by himself. He noticed a swelling in his right perineum around January this year.  He thought this was a hernia, but it turned out to be an infection.  He took antibiotics and it resolved. But then in Feb 2015, he noticed a slushy feeling in his left groin.  He saw Dr. Mellody Memos, who said that he had a left inguinal hernia.  He has had no prior inguinal hernia surgery or inguinal problems.  His only abdominal surgery was an appendectomy around 1985.  He has had regular colonoscopy follow up. We talked a while about his work for  the [employer]. And we talked about [name], who was in [facility], who recently died.   Past Medical History  Diagnosis Date  . Arthritis   . Blood transfusion without reported diagnosis       Past Surgical History  Procedure Laterality Date  . Appendectomy    . Knee arthrocentesis    . Shoulder acromioplasty    . Sinus endo w/fusion    . Tonsillectomy and adenoidectomy    . Exploration post operative open heart        Current Outpatient Prescriptions  Medication Sig Dispense Refill  . atorvastatin (LIPITOR) 10 MG tablet       . cetirizine (ZYRTEC) 10 MG tablet Take 10 mg by mouth daily.      Marland Kitchen omeprazole (PRILOSEC) 10 MG capsule Take 10 mg by mouth daily.      . TGT ASPIRIN LOW DOSE 81 MG EC tablet        No current facility-administered medications for this visit.      Allergies  Allergen Reactions  . Neosporin [Neomycin-Bacitracin Zn-Polymyx]   . Pravastatin   . Toprol Xl [Metoprolol Tartrate]     REVIEW OF SYSTEMS: Skin:  No history of rash.  No history of abnormal moles. Infection:  No history of hepatitis or HIV.  No history of MRSA. Neurologic:  Dr. Sherwood Gambler  did neck surgery on him around 2004. Cardiac:  HTN.  Had CABG x 3 in 2005.  Followed by Dr. Linard Millers.  He still goes to cardiac rehab several days a week.  He talks about running into Dr. Sherwood Gambler there. Pulmonary:  Does not smoke cigarettes.  No asthma or bronchitis.  No OSA/CPAP.  Endocrine:  No diabetes. No thyroid disease. Gastrointestinal:  No history of stomach disease.  No history of liver disease.  No history of gall bladder disease.  He had an appendectomy around 1985.  No history of pancreas disease.  No history of colon disease. Urologic:  No history of kidney stones.  No history of bladder infections. Musculoskeletal:  History of right shoulder and right knee surgery in remote past. Hematologic:  No bleeding disorder.  No history of anemia.  Not  anticoagulated. Psycho-social:  The patient is oriented.   The patient has no obvious psychologic or social impairment to understanding our conversation and plan.  SOCIAL and FAMILY HISTORY: Unmarried. 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 I knew [name] from [employer].  PHYSICAL EXAM: BP 142/82  Pulse 80  Temp(Src) 98.5 F (36.9 C) (Oral)  Resp 16  Ht 5\' 9"  (1.753 m)  Wt 177 lb 12.8 oz (80.65 kg)  BMI 26.24 kg/m2  General: WN WM who is alert and generally healthy appearing.  HEENT: Normal. Pupils equal. Neck: Supple. No mass.  No thyroid mass. Lymph Nodes:  No supraclavicular or cervical nodes. Lungs: Clear to auscultation and symmetric breath sounds. Heart:  RRR. No murmur or rub. Abdomen: Soft. No mass. No tenderness. No hernia. Normal bowel sounds.  Small left inguinal hernia which is reducible. Rectal: Not done. Extremities:  Good strength and ROM  in upper and lower extremities. Neurologic:  Grossly intact to motor and sensory function. Psychiatric: Has normal mood and affect. Behavior is normal.   DATA REVIEWED: Epic notes.  Alphonsa Overall, MD,  [facility], [place]  [place], [place]    [place] Phone:  [phone] FAX:  [phone]

## 2014-03-13 ENCOUNTER — Encounter (HOSPITAL_COMMUNITY)
Admission: RE | Admit: 2014-03-13 | Discharge: 2014-03-13 | Disposition: A | Discharge status: Home / Self Care | Payer: Self-pay | Source: Ambulatory Visit | Attending: Interventional Cardiology | Admitting: Interventional Cardiology

## 2014-03-13 DIAGNOSIS — Z5189 Encounter for other specified aftercare: Secondary | ICD-10-CM | POA: Insufficient documentation

## 2014-03-13 DIAGNOSIS — I10 Essential (primary) hypertension: Secondary | ICD-10-CM | POA: Insufficient documentation

## 2014-03-13 DIAGNOSIS — Z8249 Family history of ischemic heart disease and other diseases of the circulatory system: Secondary | ICD-10-CM | POA: Insufficient documentation

## 2014-03-13 DIAGNOSIS — I2510 Atherosclerotic heart disease of native coronary artery without angina pectoris: Secondary | ICD-10-CM | POA: Insufficient documentation

## 2014-03-14 ENCOUNTER — Encounter (HOSPITAL_COMMUNITY)
Admission: RE | Admit: 2014-03-14 | Discharge: 2014-03-14 | Disposition: A | Discharge status: Home / Self Care | Payer: Self-pay | Source: Ambulatory Visit | Attending: Interventional Cardiology | Admitting: Interventional Cardiology

## 2014-03-18 ENCOUNTER — Encounter (HOSPITAL_COMMUNITY)
Admission: RE | Admit: 2014-03-18 | Discharge: 2014-03-18 | Disposition: A | Discharge status: Home / Self Care | Payer: Self-pay | Source: Ambulatory Visit | Attending: Interventional Cardiology | Admitting: Interventional Cardiology

## 2014-03-19 NOTE — Telephone Encounter (Signed)
Patient wants a call back ASAP  

## 2014-03-19 NOTE — Telephone Encounter (Signed)
I sent clearance to Dr. Lucia Gaskins last week. He received it and has included my statement of clearance in his H and P dated 03/12/14.

## 2014-03-19 NOTE — Telephone Encounter (Signed)
Pt was informed and was appreciative.

## 2014-03-19 NOTE — Telephone Encounter (Signed)
PT called back stating his frustrations about getting cardiac clearance for hernia repair. Per pt he has been waiting for a week for advice. I called Dr Ashok Cordia office/ spoke with [facility] in triage, request has been sent twice/ Dr Tamala Julian will not be in the office but may give consent in memo. Pt last OV was 06/04/13. I will forward to Dr Tamala Julian and CMA and wait for a reply/ Dr is in hospital this week. Pt was updated with plan and will wait till he hears back. I will place copy of last ov on dr's cart.

## 2014-03-19 NOTE — Telephone Encounter (Signed)
msg left to call back

## 2014-03-20 ENCOUNTER — Encounter (HOSPITAL_COMMUNITY)
Admission: RE | Admit: 2014-03-20 | Discharge: 2014-03-20 | Disposition: A | Discharge status: Home / Self Care | Payer: Self-pay | Source: Ambulatory Visit | Attending: Interventional Cardiology | Admitting: Interventional Cardiology

## 2014-03-21 ENCOUNTER — Encounter (HOSPITAL_COMMUNITY)
Admission: RE | Admit: 2014-03-21 | Discharge: 2014-03-21 | Disposition: A | Discharge status: Home / Self Care | Payer: Self-pay | Source: Ambulatory Visit | Attending: Interventional Cardiology | Admitting: Interventional Cardiology

## 2014-03-24 ENCOUNTER — Telehealth (INDEPENDENT_AMBULATORY_CARE_PROVIDER_SITE_OTHER): Payer: Self-pay | Admitting: Surgery

## 2014-03-24 ENCOUNTER — Other Ambulatory Visit (INDEPENDENT_AMBULATORY_CARE_PROVIDER_SITE_OTHER): Payer: Self-pay | Admitting: Surgery

## 2014-03-24 NOTE — Telephone Encounter (Signed)
Pt called into surgery scheduling irritated stating he knows that Dr Lucia Gaskins received his cardiac clearance April 1 and yet he still has not been contracted to when his surgery is?  He states that Lydia told him that surgery schedulers had his orders and would contact him   We have no orders on this patient

## 2014-03-25 ENCOUNTER — Encounter (HOSPITAL_COMMUNITY): Payer: Self-pay

## 2014-03-26 ENCOUNTER — Encounter (HOSPITAL_BASED_OUTPATIENT_CLINIC_OR_DEPARTMENT_OTHER): Payer: Self-pay | Admitting: *Deleted

## 2014-03-26 NOTE — Progress Notes (Signed)
Saw dr Tamala Julian for cardiac clearance-will get ekg-will come in for bmet

## 2014-03-27 ENCOUNTER — Encounter (HOSPITAL_COMMUNITY)
Admission: RE | Admit: 2014-03-27 | Discharge: 2014-03-27 | Disposition: A | Discharge status: Home / Self Care | Payer: Self-pay | Source: Ambulatory Visit | Attending: Interventional Cardiology | Admitting: Interventional Cardiology

## 2014-03-28 ENCOUNTER — Encounter (HOSPITAL_BASED_OUTPATIENT_CLINIC_OR_DEPARTMENT_OTHER)
Admission: RE | Admit: 2014-03-28 | Discharge: 2014-03-28 | Disposition: A | Discharge status: Home / Self Care | Payer: Medicare Other | Source: Ambulatory Visit | Attending: Surgery | Admitting: Surgery

## 2014-03-28 ENCOUNTER — Encounter (HOSPITAL_COMMUNITY)
Admission: RE | Admit: 2014-03-28 | Discharge: 2014-03-28 | Disposition: A | Discharge status: Home / Self Care | Payer: Self-pay | Source: Ambulatory Visit | Attending: Interventional Cardiology | Admitting: Interventional Cardiology

## 2014-03-28 DIAGNOSIS — Z888 Allergy status to other drugs, medicaments and biological substances status: Secondary | ICD-10-CM | POA: Diagnosis not present

## 2014-03-28 DIAGNOSIS — Z79899 Other long term (current) drug therapy: Secondary | ICD-10-CM | POA: Diagnosis not present

## 2014-03-28 DIAGNOSIS — I10 Essential (primary) hypertension: Secondary | ICD-10-CM | POA: Diagnosis not present

## 2014-03-28 DIAGNOSIS — Z883 Allergy status to other anti-infective agents status: Secondary | ICD-10-CM | POA: Diagnosis not present

## 2014-03-28 DIAGNOSIS — Z951 Presence of aortocoronary bypass graft: Secondary | ICD-10-CM | POA: Diagnosis not present

## 2014-03-28 DIAGNOSIS — M519 Unspecified thoracic, thoracolumbar and lumbosacral intervertebral disc disorder: Secondary | ICD-10-CM | POA: Diagnosis not present

## 2014-03-28 DIAGNOSIS — K4090 Unilateral inguinal hernia, without obstruction or gangrene, not specified as recurrent: Secondary | ICD-10-CM | POA: Diagnosis not present

## 2014-03-28 DIAGNOSIS — M129 Arthropathy, unspecified: Secondary | ICD-10-CM | POA: Diagnosis not present

## 2014-03-28 DIAGNOSIS — I2510 Atherosclerotic heart disease of native coronary artery without angina pectoris: Secondary | ICD-10-CM | POA: Diagnosis not present

## 2014-03-28 LAB — BASIC METABOLIC PANEL
BUN: 22 mg/dL (ref 6–23)
CALCIUM: 9.5 mg/dL (ref 8.4–10.5)
CO2: 25 mEq/L (ref 19–32)
Chloride: 104 mEq/L (ref 96–112)
Creatinine, Ser: 0.82 mg/dL (ref 0.50–1.35)
GFR calc Af Amer: >90 mL/min (ref >90)
GFR, EST NON AFRICAN AMERICAN: 88 mL/min — AB (ref >90)
Glucose, Bld: 112 mg/dL — ABNORMAL HIGH (ref 70–99)
Potassium: 5 mEq/L (ref 3.7–5.3)
SODIUM: 142 meq/L (ref 137–147)

## 2014-03-31 ENCOUNTER — Encounter (HOSPITAL_COMMUNITY)
Admission: RE | Admit: 2014-03-31 | Discharge: 2014-03-31 | Disposition: A | Discharge status: Home / Self Care | Payer: Self-pay | Source: Ambulatory Visit | Attending: Interventional Cardiology | Admitting: Interventional Cardiology

## 2014-04-01 ENCOUNTER — Encounter (HOSPITAL_BASED_OUTPATIENT_CLINIC_OR_DEPARTMENT_OTHER): Payer: Medicare Other | Admitting: Anesthesiology

## 2014-04-01 ENCOUNTER — Encounter (HOSPITAL_BASED_OUTPATIENT_CLINIC_OR_DEPARTMENT_OTHER): Payer: Self-pay | Admitting: Anesthesiology

## 2014-04-01 ENCOUNTER — Ambulatory Visit (HOSPITAL_BASED_OUTPATIENT_CLINIC_OR_DEPARTMENT_OTHER): Payer: Medicare Other | Admitting: Anesthesiology

## 2014-04-01 ENCOUNTER — Encounter (HOSPITAL_BASED_OUTPATIENT_CLINIC_OR_DEPARTMENT_OTHER)
Admission: RE | Disposition: A | Discharge status: Home / Self Care | Payer: Self-pay | Source: Ambulatory Visit | Attending: Surgery

## 2014-04-01 ENCOUNTER — Encounter (HOSPITAL_COMMUNITY): Payer: Self-pay

## 2014-04-01 ENCOUNTER — Ambulatory Visit (HOSPITAL_BASED_OUTPATIENT_CLINIC_OR_DEPARTMENT_OTHER)
Admission: RE | Admit: 2014-04-01 | Discharge: 2014-04-01 | Disposition: A | Discharge status: Home / Self Care | Payer: Medicare Other | Source: Ambulatory Visit | Attending: Surgery | Admitting: Surgery

## 2014-04-01 DIAGNOSIS — Z883 Allergy status to other anti-infective agents status: Secondary | ICD-10-CM | POA: Insufficient documentation

## 2014-04-01 DIAGNOSIS — Z951 Presence of aortocoronary bypass graft: Secondary | ICD-10-CM | POA: Insufficient documentation

## 2014-04-01 DIAGNOSIS — I10 Essential (primary) hypertension: Secondary | ICD-10-CM | POA: Insufficient documentation

## 2014-04-01 DIAGNOSIS — K4090 Unilateral inguinal hernia, without obstruction or gangrene, not specified as recurrent: Secondary | ICD-10-CM | POA: Diagnosis not present

## 2014-04-01 DIAGNOSIS — M129 Arthropathy, unspecified: Secondary | ICD-10-CM | POA: Insufficient documentation

## 2014-04-01 DIAGNOSIS — Z888 Allergy status to other drugs, medicaments and biological substances status: Secondary | ICD-10-CM | POA: Insufficient documentation

## 2014-04-01 DIAGNOSIS — M519 Unspecified thoracic, thoracolumbar and lumbosacral intervertebral disc disorder: Secondary | ICD-10-CM | POA: Insufficient documentation

## 2014-04-01 DIAGNOSIS — I2510 Atherosclerotic heart disease of native coronary artery without angina pectoris: Secondary | ICD-10-CM | POA: Diagnosis not present

## 2014-04-01 DIAGNOSIS — R109 Unspecified abdominal pain: Secondary | ICD-10-CM | POA: Diagnosis not present

## 2014-04-01 DIAGNOSIS — Z79899 Other long term (current) drug therapy: Secondary | ICD-10-CM | POA: Insufficient documentation

## 2014-04-01 DIAGNOSIS — G8918 Other acute postprocedural pain: Secondary | ICD-10-CM | POA: Diagnosis not present

## 2014-04-01 HISTORY — PX: INGUINAL HERNIA REPAIR: SHX194

## 2014-04-01 HISTORY — DX: Presence of spectacles and contact lenses: Z97.3

## 2014-04-01 HISTORY — DX: Atherosclerotic heart disease of native coronary artery without angina pectoris: I25.10

## 2014-04-01 HISTORY — PX: INSERTION OF MESH: SHX5868

## 2014-04-01 HISTORY — DX: Gastro-esophageal reflux disease without esophagitis: K21.9

## 2014-04-01 HISTORY — DX: Other seasonal allergic rhinitis: J30.2

## 2014-04-01 LAB — POCT HEMOGLOBIN-HEMACUE: Hemoglobin: 14.3 g/dL (ref 13.0–17.0)

## 2014-04-01 SURGERY — REPAIR, HERNIA, INGUINAL, ADULT
Anesthesia: General | Site: Groin | Laterality: Left

## 2014-04-01 MED ORDER — HYDROMORPHONE HCL PF 1 MG/ML IJ SOLN
INTRAMUSCULAR | Status: AC
  Filled 2014-04-01: qty 1

## 2014-04-01 MED ORDER — MIDAZOLAM HCL 2 MG/2ML IJ SOLN
INTRAMUSCULAR | Status: AC
  Filled 2014-04-01: qty 2

## 2014-04-01 MED ORDER — FENTANYL CITRATE 0.05 MG/ML IJ SOLN
INTRAMUSCULAR | Status: AC
  Filled 2014-04-01: qty 2

## 2014-04-01 MED ORDER — FENTANYL CITRATE 0.05 MG/ML IJ SOLN
INTRAMUSCULAR | Status: DC | PRN
  Administered 2014-04-01 (×2): 50 ug via INTRAVENOUS

## 2014-04-01 MED ORDER — BUPIVACAINE-EPINEPHRINE PF 0.5-1:200000 % IJ SOLN
INTRAMUSCULAR | Status: DC | PRN
  Administered 2014-04-01: 25 mL via PERINEURAL

## 2014-04-01 MED ORDER — PROPOFOL 10 MG/ML IV BOLUS
INTRAVENOUS | Status: DC | PRN
  Administered 2014-04-01: 50 mg via INTRAVENOUS

## 2014-04-01 MED ORDER — OXYCODONE HCL 5 MG/5ML PO SOLN: 5.0000 mg | Freq: Once | ORAL | Status: AC | PRN

## 2014-04-01 MED ORDER — CEFAZOLIN SODIUM-DEXTROSE 2-3 GM-% IV SOLR
INTRAVENOUS | Status: AC
  Filled 2014-04-01: qty 50

## 2014-04-01 MED ORDER — HYDROMORPHONE HCL PF 1 MG/ML IJ SOLN
0.2500 mg | INTRAMUSCULAR | Status: DC | PRN
  Administered 2014-04-01 (×2): 0.5 mg via INTRAVENOUS

## 2014-04-01 MED ORDER — OXYCODONE HCL 5 MG PO TABS
ORAL_TABLET | ORAL | Status: AC
  Filled 2014-04-01: qty 1

## 2014-04-01 MED ORDER — HYDROCODONE-ACETAMINOPHEN 7.5-500 MG PO TABS: 1.0000 | ORAL_TABLET | Freq: Four times a day (QID) | ORAL | Status: DC | PRN

## 2014-04-01 MED ORDER — ONDANSETRON HCL 4 MG/2ML IJ SOLN
INTRAMUSCULAR | Status: DC | PRN
  Administered 2014-04-01: 4 mg via INTRAVENOUS

## 2014-04-01 MED ORDER — LACTATED RINGERS IV SOLN
INTRAVENOUS | Status: DC
  Administered 2014-04-01: 20 mL/h via INTRAVENOUS

## 2014-04-01 MED ORDER — LIDOCAINE HCL (CARDIAC) 20 MG/ML IV SOLN
INTRAVENOUS | Status: DC | PRN
  Administered 2014-04-01: 50 mg via INTRAVENOUS

## 2014-04-01 MED ORDER — CEFAZOLIN SODIUM-DEXTROSE 2-3 GM-% IV SOLR
2.0000 g | INTRAVENOUS | Status: AC
  Administered 2014-04-01: 2 g via INTRAVENOUS

## 2014-04-01 MED ORDER — MIDAZOLAM HCL 2 MG/2ML IJ SOLN
1.0000 mg | INTRAMUSCULAR | Status: DC | PRN
  Administered 2014-04-01: 2 mg via INTRAVENOUS

## 2014-04-01 MED ORDER — DEXAMETHASONE SODIUM PHOSPHATE 4 MG/ML IJ SOLN
INTRAMUSCULAR | Status: DC | PRN
  Administered 2014-04-01: 10 mg via INTRAVENOUS

## 2014-04-01 MED ORDER — MIDAZOLAM HCL 5 MG/5ML IJ SOLN
INTRAMUSCULAR | Status: DC | PRN
  Administered 2014-04-01 (×2): 1 mg via INTRAVENOUS

## 2014-04-01 MED ORDER — ONDANSETRON HCL 4 MG/2ML IJ SOLN: 4.0000 mg | Freq: Once | INTRAMUSCULAR | Status: DC | PRN

## 2014-04-01 MED ORDER — FENTANYL CITRATE 0.05 MG/ML IJ SOLN
INTRAMUSCULAR | Status: AC
  Filled 2014-04-01: qty 6

## 2014-04-01 MED ORDER — BUPIVACAINE HCL (PF) 0.25 % IJ SOLN
INTRAMUSCULAR | Status: DC | PRN
  Administered 2014-04-01: 10 mL

## 2014-04-01 MED ORDER — CHLORHEXIDINE GLUCONATE 4 % EX LIQD: 1.0000 "application " | Freq: Once | CUTANEOUS | Status: DC

## 2014-04-01 MED ORDER — FENTANYL CITRATE 0.05 MG/ML IJ SOLN
50.0000 ug | INTRAMUSCULAR | Status: DC | PRN
  Administered 2014-04-01: 100 ug via INTRAVENOUS

## 2014-04-01 MED ORDER — OXYCODONE HCL 5 MG PO TABS
5.0000 mg | ORAL_TABLET | Freq: Once | ORAL | Status: AC | PRN
  Administered 2014-04-01: 5 mg via ORAL

## 2014-04-01 SURGICAL SUPPLY — 52 items
ADH SKN CLS APL DERMABOND .7 (GAUZE/BANDAGES/DRESSINGS) ×1
APL SKNCLS STERI-STRIP NONHPOA (GAUZE/BANDAGES/DRESSINGS)
BENZOIN TINCTURE PRP APPL 2/3 (GAUZE/BANDAGES/DRESSINGS) IMPLANT
BLADE SURG 10 STRL SS (BLADE) ×2 IMPLANT
BLADE SURG ROTATE 9660 (MISCELLANEOUS) ×2 IMPLANT
CHLORAPREP W/TINT 26ML (MISCELLANEOUS) ×2 IMPLANT
CLEANER CAUTERY TIP 5X5 PAD (MISCELLANEOUS) ×1 IMPLANT
COVER MAYO STAND STRL (DRAPES) ×2 IMPLANT
COVER TABLE BACK 60X90 (DRAPES) ×2 IMPLANT
DECANTER SPIKE VIAL GLASS SM (MISCELLANEOUS) ×1 IMPLANT
DERMABOND ADVANCED (GAUZE/BANDAGES/DRESSINGS) ×1
DERMABOND ADVANCED .7 DNX12 (GAUZE/BANDAGES/DRESSINGS) ×1 IMPLANT
DRAIN PENROSE 1/2X12 LTX STRL (WOUND CARE) ×2 IMPLANT
DRAPE LAPAROTOMY T 102X78X121 (DRAPES) ×2 IMPLANT
DRAPE UTILITY XL STRL (DRAPES) ×2 IMPLANT
ELECT REM PT RETURN 9FT ADLT (ELECTROSURGICAL) ×2
ELECTRODE REM PT RTRN 9FT ADLT (ELECTROSURGICAL) ×1 IMPLANT
GLOVE BIO SURGEON STRL SZ7.5 (GLOVE) ×1 IMPLANT
GLOVE BIOGEL PI IND STRL 7.0 (GLOVE) IMPLANT
GLOVE BIOGEL PI IND STRL 8 (GLOVE) IMPLANT
GLOVE BIOGEL PI INDICATOR 7.0 (GLOVE) ×2
GLOVE BIOGEL PI INDICATOR 8 (GLOVE) ×1
GLOVE ECLIPSE 6.5 STRL STRAW (GLOVE) ×2 IMPLANT
GLOVE SURG SIGNA 7.5 PF LTX (GLOVE) ×2 IMPLANT
GOWN STRL REUS W/ TWL LRG LVL3 (GOWN DISPOSABLE) ×2 IMPLANT
GOWN STRL REUS W/ TWL XL LVL3 (GOWN DISPOSABLE) IMPLANT
GOWN STRL REUS W/TWL LRG LVL3 (GOWN DISPOSABLE) ×6
GOWN STRL REUS W/TWL XL LVL3 (GOWN DISPOSABLE) ×2
MESH ULTRAPRO 3X6 7.6X15CM (Mesh General) ×1 IMPLANT
NDL HYPO 25X1 1.5 SAFETY (NEEDLE) ×1 IMPLANT
NEEDLE HYPO 25X1 1.5 SAFETY (NEEDLE) ×2 IMPLANT
NS IRRIG 1000ML POUR BTL (IV SOLUTION) ×1 IMPLANT
PACK BASIN DAY SURGERY FS (CUSTOM PROCEDURE TRAY) ×2 IMPLANT
PAD CLEANER CAUTERY TIP 5X5 (MISCELLANEOUS) ×1
PENCIL BUTTON HOLSTER BLD 10FT (ELECTRODE) ×2 IMPLANT
SLEEVE SCD COMPRESS KNEE MED (MISCELLANEOUS) ×1 IMPLANT
SPONGE GAUZE 4X4 12PLY STER LF (GAUZE/BANDAGES/DRESSINGS) IMPLANT
SPONGE INTESTINAL PEANUT (DISPOSABLE) ×1 IMPLANT
SPONGE LAP 18X18 X RAY DECT (DISPOSABLE) ×2 IMPLANT
STRIP CLOSURE SKIN 1/2X4 (GAUZE/BANDAGES/DRESSINGS) IMPLANT
SUT CHROMIC 2 0 SH (SUTURE) IMPLANT
SUT MON AB 5-0 PS2 18 (SUTURE) ×2 IMPLANT
SUT NOVA 0 T19/GS 22DT (SUTURE) ×4 IMPLANT
SUT VIC AB 0 SH 27 (SUTURE) IMPLANT
SUT VIC AB 2-0 SH 27 (SUTURE) ×2
SUT VIC AB 2-0 SH 27XBRD (SUTURE) IMPLANT
SUT VICRYL 3-0 CR8 SH (SUTURE) ×2 IMPLANT
SUT VICRYL AB 2 0 TIE (SUTURE) IMPLANT
SUT VICRYL AB 2 0 TIES (SUTURE)
SYR CONTROL 10ML LL (SYRINGE) ×2 IMPLANT
TOWEL OR 17X24 6PK STRL BLUE (TOWEL DISPOSABLE) ×3 IMPLANT
TOWEL OR NON WOVEN STRL DISP B (DISPOSABLE) ×2 IMPLANT

## 2014-04-01 NOTE — Progress Notes (Signed)
Assisted Dr. Crews with left, ultrasound guided, transabdominal plane block. Side rails up, monitors on throughout procedure. See vital signs in flow sheet. Tolerated Procedure well. 

## 2014-04-01 NOTE — Discharge Instructions (Signed)
[facility] - DISCHARGE INSTRUCTIONS TO PATIENT  Activity:  Driving - May drive in 2 or 3 days, if doing well.   Lifting - No lifting greater than 15 pounds for 4 weeks.  Wound Care:   Leave incision dry for 2 days, then may shower.  Diet:  As tolerated.  Follow up appointment:  Call Dr. Pollie Friar office [facility]) at [phone] for an appointment in 2 to 3 weeks.  Medications and dosages:  Resume your home medications.  You have a prescription for:  Vicodin   Call Dr. Lucia Gaskins or his office  [phone]) if you have:  Temperature greater than 100.4,  Persistent nausea and vomiting,  Severe uncontrolled pain,  Redness, tenderness, or signs of infection (pain, swelling, redness, odor or green/yellow discharge around the site),  Difficulty breathing, headache or visual disturbances,  Any other questions or concerns you may have after discharge.  In an emergency, call 911 or go to an Emergency Department at a nearby hospital.    Post Anesthesia Home Care Instructions  Activity: Get plenty of rest for the remainder of the day. A responsible adult should stay with you for 24 hours following the procedure.  For the next 24 hours, DO NOT: -Drive a car -Paediatric nurse -Drink alcoholic beverages -Take any medication unless instructed by your physician -Make any legal decisions or sign important papers.  Meals: Start with liquid foods such as gelatin or soup. Progress to regular foods as tolerated. Avoid greasy, spicy, heavy foods. If nausea and/or vomiting occur, drink only clear liquids until the nausea and/or vomiting subsides. Call your physician if vomiting continues.  Special Instructions/Symptoms: Your throat may feel dry or sore from the anesthesia or the breathing tube placed in your throat during surgery. If this causes discomfort, gargle with warm salt water. The discomfort should disappear within 24 hours.

## 2014-04-01 NOTE — Anesthesia Preprocedure Evaluation (Signed)
Anesthesia Evaluation  Patient identified by MRN, date of birth, ID band Patient awake    Reviewed: Allergy & Precautions, H&P , NPO status , Patient's Chart, lab work & pertinent test results  Airway Mallampati: I TM Distance: >3 FB Neck ROM: Full    Dental  (+) Teeth Intact, Dental Advisory Given   Pulmonary former smoker,  breath sounds clear to auscultation        Cardiovascular + CAD Rhythm:Regular Rate:Normal     Neuro/Psych    GI/Hepatic GERD-  Medicated and Controlled,  Endo/Other    Renal/GU      Musculoskeletal   Abdominal   Peds  Hematology   Anesthesia Other Findings   Reproductive/Obstetrics                           Anesthesia Physical Anesthesia Plan  ASA: II  Anesthesia Plan: General   Post-op Pain Management:    Induction: Intravenous  Airway Management Planned: LMA  Additional Equipment:   Intra-op Plan:   Post-operative Plan: Extubation in OR  Informed Consent: I have reviewed the patients History and Physical, chart, labs and discussed the procedure including the risks, benefits and alternatives for the proposed anesthesia with the patient or authorized representative who has indicated his/her understanding and acceptance.   Dental advisory given  Plan Discussed with: CRNA, Anesthesiologist and Surgeon  Anesthesia Plan Comments:         Anesthesia Quick Evaluation

## 2014-04-01 NOTE — Interval H&P Note (Signed)
History and Physical Interval Note:  04/01/2014 12:08 PM  Nathan [name]  has presented today for surgery, with the diagnosis of left inguinal hernia   The various methods of treatment have been discussed with the patient and family.  Fae Pippin, is with him.  After consideration of risks, benefits and other options for treatment, the patient has consented to  Procedure(s): HERNIA REPAIR INGUINAL ADULT (Left) INSERTION OF MESH (Left) as a surgical intervention .  The patient's history has been reviewed, patient examined, no change in status, stable for surgery.  I have reviewed the patient's chart and labs.  Questions were answered to the patient's satisfaction.     Shann Medal

## 2014-04-01 NOTE — Anesthesia Postprocedure Evaluation (Signed)
  Anesthesia Post-op Note  Patient: Nathan [name]  Procedure(s) Performed: Procedure(s): HERNIA REPAIR INGUINAL ADULT (Left) INSERTION OF MESH (Left)  Patient Location: PACU  Anesthesia Type:GA combined with regional for post-op pain  Level of Consciousness: awake, alert  and oriented  Airway and Oxygen Therapy: Patient Spontanous Breathing  Post-op Pain: mild  Post-op Assessment: Post-op Vital signs reviewed  Post-op Vital Signs: Reviewed  Last Vitals:  Filed Vitals:   04/01/14 1415  BP: 146/67  Pulse: 71  Temp:   Resp: 10    Complications: No apparent anesthesia complications

## 2014-04-01 NOTE — Transfer of Care (Signed)
Immediate Anesthesia Transfer of Care Note  Patient: Nathan [name]  Procedure(s) Performed: Procedure(s): HERNIA REPAIR INGUINAL ADULT (Left) INSERTION OF MESH (Left)  Patient Location: PACU  Anesthesia Type:General and Regional  Level of Consciousness: awake  Airway & Oxygen Therapy: Patient Spontanous Breathing and Patient connected to face mask oxygen  Post-op Assessment: Report given to PACU RN and Post -op Vital signs reviewed and stable  Post vital signs: Reviewed and stable  Complications: No apparent anesthesia complications

## 2014-04-01 NOTE — Op Note (Signed)
04/01/2014  2:04 PM  PATIENT:  Nathan [name], 68 y.o., male, MRN: [id number]  PREOP DIAGNOSIS:  left inguinal hernia    POSTOP DIAGNOSIS:   Direct left inguinal hernia   PROCEDURE:   Procedure(s):  HERNIA REPAIR INGUINAL ADULT, INSERTION OF MESH  SURGEON:   Alphonsa Overall, M.D.  ANESTHESIA:   general  CRNA: Willa Frater, CRNA  General  EBL:  Minimal  ml  LOCAL MEDICATIONS USED:   10 cc 1/4% in addition to block placed by Dr. Al Corpus.  SPECIMEN:   None  COUNTS CORRECT:  YES  INDICATIONS FOR PROCEDURE:  [name] is a 68 y.o. (DOB: Feb 17, 1946) white  male whose primary care physician is Garlan Fair, MD and comes for repair of left inguinal hernia.   The indications and risks of the hernia surgery were explained to the patient.  The risks include, but are not limited to, infection, bleeding, recurrence of the hernia, and nerve injury.  Operative Note: The patient was taken to room number 8 at [facility].  He underwent a general anesthesia.    A time out was held and the surgical checklist run.  His lower abdomen was shaved and then prepped with chloroprep.  A left inguinal incision was made through the subcutaneous fat to the external oblique fascia.  The external ring was opened and the cord structures encircled with a penrose drain.  The patient had an medium sized direct inguinal hernia.  I identified the ileo inguinal nerve and preserved this during the dissection.  The external ring was opened and the cord structures encircled with a penrose drain.  The patient had an direct left inguinal hernia.  There was no evidence of a indirect inguinal hernia.  I skeletonized the cord structures.  I used a 2-0 Vicryl sutures to imbricate the direct inguina hernia.  The inguinal floor was repaired with a 3 x 6 inch piece of Ultrapro mesh.  The mesh was cut to fit the inguinal floor.  The mesh was sewn in place with interrupted 0 Novafil suture.  A key hole was made for the  internal ring.  The mesh lay flat.  The inguinal floor was covered and the internal ring recreated.  The cord structures were returned to a normal location.  The external oblique was closed with a 3-0 vicryl.  The fascia and subcutaneous tissues were infiltrated with 10 cc of 1/4% marcaine plain.  Dr. Al Corpus had placed a block at the beginning of the case and this limited the amount of local I could use.  The skin was closed with 5-0 monocryl and painted with Dermabond. The sponge and needle count were correct at the end of the case.  The patient was transported to the recovery room in good condition.  The patient will go home today.  Discharge instructions reviewed.  Alphonsa Overall, MD, [facility] Pager: [phone] Office phone:  [phone]

## 2014-04-01 NOTE — Anesthesia Procedure Notes (Signed)
Anesthesia Regional Block:  TAP block  Pre-Anesthetic Checklist: ,, timeout performed, Correct Patient, Correct Site, Correct Laterality, Correct Procedure, Correct Position, site marked, Risks and benefits discussed,  Surgical consent,  Pre-op evaluation,  At surgeon's request and post-op pain management  Laterality: Left and Lower  Prep: chloraprep       Needles:  Injection technique: Single-shot  Needle Type: Echogenic Needle     Needle Length: 9cm 9 cm Needle Gauge: 21 and 21 G    Additional Needles:  Procedures: ultrasound guided (picture in chart) TAP block Narrative:  Start time: 04/01/2014 12:00 PM End time: 04/01/2014 12:08 PM Injection made incrementally with aspirations every 5 mL.  Performed by: Personally  Anesthesiologist: Lorrene Reid, MD

## 2014-04-01 NOTE — H&P (View-Only) (Signed)
Re:   Nathan [name] DOB:   01/01/1946 MRN:   [id number]  ASSESSMENT AND PLAN: 1.  Left inguinal hernia  I discussed the indications and complications of hernia surgery with the patient.  I discussed both the laparoscopic and open approach to hernia repair..  The potential risks of hernia surgery include, but are not limited to, bleeding, infection, open surgery, nerve injury, and recurrence of the hernia.  I provided the patient literature about hernia surgery.  Because he travels, he is not interested in observing this hernia.  At this time, we are going ahead with an open left inguinal hernia.  2.  HTN 3.  History of cervical and lumbar disk disease 4.  CAD  CABG x 3 - 06/24/2004 - Dr. Ricard Dillon  He has been stable and doing well.  Followed by Dr. Linard Millers - will need card clearance. [[name] has done very well from the Cardiovascular viewpoint since CAB surgery. Unless there are new symptoms worrisome for angina or heart failure, he is CLEARED for the upcoming hernia procedure. Illene Labrador, III, MD - DN 03/13/2014]  5.  Neck surgery  2004 - [place]   Chief Complaint  Patient presents with  . eval lih    new pt   REFERRING PHYSICIAN: Garlan Fair, MD  HISTORY OF PRESENT ILLNESS: Nathan [name] is a 68 y.o. (DOB: 10/04/1946)  white  male whose primary care physician is Nathan Fair, MD and comes to me today for left inguinal hernia. He comes by himself. He noticed a swelling in his right perineum around January this year.  He thought this was a hernia, but it turned out to be an infection.  He took antibiotics and it resolved. But then in Feb 2015, he noticed a slushy feeling in his left groin.  He saw Dr. Mellody Memos, who said that he had a left inguinal hernia.  He has had no prior inguinal hernia surgery or inguinal problems.  His only abdominal surgery was an appendectomy around 1985.  He has had regular colonoscopy follow up. We talked a while about his work for  the [employer]. And we talked about [name], who was in [facility], who recently died.   Past Medical History  Diagnosis Date  . Arthritis   . Blood transfusion without reported diagnosis       Past Surgical History  Procedure Laterality Date  . Appendectomy    . Knee arthrocentesis    . Shoulder acromioplasty    . Sinus endo w/fusion    . Tonsillectomy and adenoidectomy    . Exploration post operative open heart        Current Outpatient Prescriptions  Medication Sig Dispense Refill  . atorvastatin (LIPITOR) 10 MG tablet       . cetirizine (ZYRTEC) 10 MG tablet Take 10 mg by mouth daily.      Marland Kitchen omeprazole (PRILOSEC) 10 MG capsule Take 10 mg by mouth daily.      . TGT ASPIRIN LOW DOSE 81 MG EC tablet        No current facility-administered medications for this visit.      Allergies  Allergen Reactions  . Neosporin [Neomycin-Bacitracin Zn-Polymyx]   . Pravastatin   . Toprol Xl [Metoprolol Tartrate]     REVIEW OF SYSTEMS: Skin:  No history of rash.  No history of abnormal moles. Infection:  No history of hepatitis or HIV.  No history of MRSA. Neurologic:  Dr. Sherwood Gambler  did neck surgery on him around 2004. Cardiac:  HTN.  Had CABG x 3 in 2005.  Followed by Dr. Linard Millers.  He still goes to cardiac rehab several days a week.  He talks about running into Dr. Sherwood Gambler there. Pulmonary:  Does not smoke cigarettes.  No asthma or bronchitis.  No OSA/CPAP.  Endocrine:  No diabetes. No thyroid disease. Gastrointestinal:  No history of stomach disease.  No history of liver disease.  No history of gall bladder disease.  He had an appendectomy around 1985.  No history of pancreas disease.  No history of colon disease. Urologic:  No history of kidney stones.  No history of bladder infections. Musculoskeletal:  History of right shoulder and right knee surgery in remote past. Hematologic:  No bleeding disorder.  No history of anemia.  Not  anticoagulated. Psycho-social:  The patient is oriented.   The patient has no obvious psychologic or social impairment to understanding our conversation and plan.  SOCIAL and FAMILY HISTORY: Unmarried. 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 I knew [name] from [employer].  PHYSICAL EXAM: BP 142/82  Pulse 80  Temp(Src) 98.5 F (36.9 C) (Oral)  Resp 16  Ht 5\' 9"  (1.753 m)  Wt 177 lb 12.8 oz (80.65 kg)  BMI 26.24 kg/m2  General: WN WM who is alert and generally healthy appearing.  HEENT: Normal. Pupils equal. Neck: Supple. No mass.  No thyroid mass. Lymph Nodes:  No supraclavicular or cervical nodes. Lungs: Clear to auscultation and symmetric breath sounds. Heart:  RRR. No murmur or rub. Abdomen: Soft. No mass. No tenderness. No hernia. Normal bowel sounds.  Small left inguinal hernia which is reducible. Rectal: Not done. Extremities:  Good strength and ROM  in upper and lower extremities. Neurologic:  Grossly intact to motor and sensory function. Psychiatric: Has normal mood and affect. Behavior is normal.   DATA REVIEWED: Epic notes.  Alphonsa Overall, MD,  [facility], [address]  [place], [place]    [place] Phone:  [phone] FAX:  [phone]

## 2014-04-02 ENCOUNTER — Encounter (HOSPITAL_BASED_OUTPATIENT_CLINIC_OR_DEPARTMENT_OTHER): Payer: Self-pay | Admitting: Surgery

## 2014-04-03 ENCOUNTER — Encounter (HOSPITAL_COMMUNITY): Payer: Self-pay

## 2014-04-04 ENCOUNTER — Encounter (HOSPITAL_COMMUNITY): Payer: Self-pay

## 2014-04-08 ENCOUNTER — Encounter (HOSPITAL_COMMUNITY): Payer: Self-pay

## 2014-04-10 ENCOUNTER — Encounter (HOSPITAL_COMMUNITY): Payer: Self-pay

## 2014-04-15 ENCOUNTER — Encounter (HOSPITAL_COMMUNITY)
Admission: RE | Admit: 2014-04-15 | Discharge: 2014-04-15 | Disposition: A | Discharge status: Home / Self Care | Payer: Self-pay | Source: Ambulatory Visit | Attending: Interventional Cardiology | Admitting: Interventional Cardiology

## 2014-04-15 DIAGNOSIS — E785 Hyperlipidemia, unspecified: Secondary | ICD-10-CM | POA: Insufficient documentation

## 2014-04-15 DIAGNOSIS — Z5189 Encounter for other specified aftercare: Secondary | ICD-10-CM | POA: Insufficient documentation

## 2014-04-15 DIAGNOSIS — I10 Essential (primary) hypertension: Secondary | ICD-10-CM | POA: Insufficient documentation

## 2014-04-15 DIAGNOSIS — I25810 Atherosclerosis of coronary artery bypass graft(s) without angina pectoris: Secondary | ICD-10-CM | POA: Insufficient documentation

## 2014-04-16 ENCOUNTER — Encounter (HOSPITAL_COMMUNITY)
Admission: RE | Admit: 2014-04-16 | Discharge: 2014-04-16 | Disposition: A | Discharge status: Home / Self Care | Payer: Self-pay | Source: Ambulatory Visit | Attending: Interventional Cardiology | Admitting: Interventional Cardiology

## 2014-04-16 DIAGNOSIS — D3130 Benign neoplasm of unspecified choroid: Secondary | ICD-10-CM | POA: Diagnosis not present

## 2014-04-16 DIAGNOSIS — H35379 Puckering of macula, unspecified eye: Secondary | ICD-10-CM | POA: Diagnosis not present

## 2014-04-16 DIAGNOSIS — H2510 Age-related nuclear cataract, unspecified eye: Secondary | ICD-10-CM | POA: Diagnosis not present

## 2014-04-17 ENCOUNTER — Encounter (HOSPITAL_COMMUNITY)
Admission: RE | Admit: 2014-04-17 | Discharge: 2014-04-17 | Disposition: A | Discharge status: Home / Self Care | Payer: Self-pay | Source: Ambulatory Visit | Attending: Interventional Cardiology | Admitting: Interventional Cardiology

## 2014-04-18 ENCOUNTER — Encounter (HOSPITAL_COMMUNITY): Payer: Self-pay

## 2014-04-22 ENCOUNTER — Encounter (HOSPITAL_COMMUNITY): Payer: Self-pay

## 2014-04-23 ENCOUNTER — Encounter (INDEPENDENT_AMBULATORY_CARE_PROVIDER_SITE_OTHER): Payer: Self-pay | Admitting: Surgery

## 2014-04-23 ENCOUNTER — Ambulatory Visit (INDEPENDENT_AMBULATORY_CARE_PROVIDER_SITE_OTHER): Payer: Medicare Other | Admitting: Surgery

## 2014-04-23 VITALS — BP 128/72 | HR 78 | Temp 98.0°F | Resp 18 | Ht 70.0 in | Wt 179.0 lb

## 2014-04-23 DIAGNOSIS — K4090 Unilateral inguinal hernia, without obstruction or gangrene, not specified as recurrent: Secondary | ICD-10-CM

## 2014-04-23 NOTE — Progress Notes (Signed)
Re:   Nathan [name] DOB:   Dec 25, 1945 MRN:   [id number]  ASSESSMENT AND PLAN: 1.  Left inguinal hernia repair, open - 04/01/2014 - D. Keimya Briddell  Has done well.  Return appt PRN   2.  HTN 3.  History of cervical and lumbar disk disease 4.  CAD  CABG x 3 - 06/24/2004 - Dr. Ricard Dillon  He has been stable and doing well.  Followed by Dr. Linard Millers  5.  Neck surgery  2004 - [place]   Chief Complaint  Patient presents with  . Routine Post Op    hernia sx 04-01-14   REFERRING PHYSICIAN: Garlan Fair, MD  HISTORY OF PRESENT ILLNESS: Nathan [name] is a 68 y.o. (DOB: 02/19/1946)  white  male whose primary care physician is Garlan Fair, MD and comes to me today for left inguinal hernia. He comes by himself. He did well for the first 2 days, then the block wore off.  And he had a lot of pain.  That pain slowly subsided, but he had one night he had a lot of groin pain.  But after sleeping, it got better.  He is walking on a tread mill some, but not doing weight.  I told him I wanted him to limit his activity to less than 15 pounds for one month from surgery.  History of hernia: He noticed a swelling in his right perineum around January this year.  He thought this was a hernia, but it turned out to be an infection.  He took antibiotics and it resolved. But then in Feb 2015, he noticed a slushy feeling in his left groin.  He saw Dr. Mellody Memos, who said that he had a left inguinal hernia.  He has had no prior inguinal hernia surgery or inguinal problems.  His only abdominal surgery was an appendectomy around 1985.  He has had regular colonoscopy follow up. We talked a while about his work for the [employer]. And we talked about [name], who was in [facility], who recently died.   Past Medical History  Diagnosis Date  . Arthritis   . Blood transfusion without reported diagnosis   . Wears glasses   . Coronary artery disease   . GERD (gastroesophageal reflux disease)   .  Seasonal allergies       Past Surgical History  Procedure Laterality Date  . Knee arthrocentesis  1980    right  . Shoulder acromioplasty  1985    rt  . Sinus endo w/fusion    . Tonsillectomy and adenoidectomy    . Exploration post operative open heart    . Appendectomy      age 78  . Colonoscopy    . Eye surgery      detached retina-lt  . Inguinal hernia repair Left 04/01/2014    Procedure: HERNIA REPAIR INGUINAL ADULT;  Surgeon: Shann Medal, MD;  Location: [facility];  Service: General;  Laterality: Left;  . Insertion of mesh Left 04/01/2014    Procedure: INSERTION OF MESH;  Surgeon: Shann Medal, MD;  Location: [facility];  Service: General;  Laterality: Left;     Current Outpatient Prescriptions  Medication Sig Dispense Refill  . atorvastatin (LIPITOR) 10 MG tablet       . cetirizine (ZYRTEC) 10 MG tablet Take 10 mg by mouth daily.      . cycloSPORINE (RESTASIS) 0.05 % ophthalmic emulsion 1 drop 2 (two) times daily.      Marland Kitchen  HYDROcodone-acetaminophen (LORTAB 7.5) 7.5-500 MG per tablet Take 1 tablet by mouth every 6 (six) hours as needed for pain.  30 tablet  0  . omeprazole (PRILOSEC) 10 MG capsule Take 20 mg by mouth daily.       . TGT ASPIRIN LOW DOSE 81 MG EC tablet       . triamcinolone (NASACORT) 55 MCG/ACT AERO nasal inhaler Place 2 sprays into the nose daily.       No current facility-administered medications for this visit.   Allergies  Allergen Reactions  . Bystolic [Nebivolol Hcl] Rash  . Neosporin [Neomycin-Bacitracin Zn-Polymyx]   . Pravastatin   . Toprol Xl [Metoprolol Tartrate]     REVIEW OF SYSTEMS: Neurologic:  Dr. Sherwood Gambler did neck surgery on him around 2004. Cardiac:  HTN.  Had CABG x 3 in 2005.  Followed by Dr. Linard Millers.  He still goes to cardiac rehab several days a week.  He talks about running into Dr. Sherwood Gambler there. Mr. Arvle has done very well from the Cardiovascular viewpoint since CAB surgery. Unless there  are new symptoms worrisome for angina or heart failure, he is CLEARED for the upcoming hernia procedure. Illene Labrador, III, MD - DN 03/13/2014 Gastrointestinal:  No history of stomach disease.  No history of liver disease.  No history of gall bladder disease.  He had an appendectomy around 1985.  No history of pancreas disease.  No history of colon disease. Musculoskeletal:  History of right shoulder and right knee surgery in remote past.  SOCIAL and FAMILY HISTORY: Unmarried. 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 I knew [name] from [employer].  PHYSICAL EXAM: BP 128/72  Pulse 78  Temp(Src) 98 F (36.7 C)  Resp 18  Ht 5\' 10"  (1.778 m)  Wt 179 lb (81.194 kg)  BMI 25.68 kg/m2  General: WN WM who is alert and generally healthy appearing.  HEENT: Normal. Pupils equal. Abdomen: Soft. Incision looks good  DATA REVIEWED: None new.  Alphonsa Overall, MD,  [facility], [address]  [place], [place]    [place] Phone:  [phone] FAX:  [phone]

## 2014-04-24 ENCOUNTER — Encounter (HOSPITAL_COMMUNITY)
Admission: RE | Admit: 2014-04-24 | Discharge: 2014-04-24 | Disposition: A | Discharge status: Home / Self Care | Payer: Self-pay | Source: Ambulatory Visit | Attending: Interventional Cardiology | Admitting: Interventional Cardiology

## 2014-04-25 ENCOUNTER — Encounter (HOSPITAL_COMMUNITY)
Admission: RE | Admit: 2014-04-25 | Discharge: 2014-04-25 | Disposition: A | Discharge status: Home / Self Care | Payer: Self-pay | Source: Ambulatory Visit | Attending: Interventional Cardiology | Admitting: Interventional Cardiology

## 2014-04-29 ENCOUNTER — Encounter (HOSPITAL_COMMUNITY)
Admission: RE | Admit: 2014-04-29 | Discharge: 2014-04-29 | Disposition: A | Discharge status: Home / Self Care | Payer: Self-pay | Source: Ambulatory Visit | Attending: Interventional Cardiology | Admitting: Interventional Cardiology

## 2014-04-29 ENCOUNTER — Encounter: Payer: Self-pay | Admitting: Interventional Cardiology

## 2014-04-29 DIAGNOSIS — L719 Rosacea, unspecified: Secondary | ICD-10-CM | POA: Diagnosis not present

## 2014-04-29 DIAGNOSIS — L723 Sebaceous cyst: Secondary | ICD-10-CM | POA: Diagnosis not present

## 2014-04-29 DIAGNOSIS — L821 Other seborrheic keratosis: Secondary | ICD-10-CM | POA: Diagnosis not present

## 2014-04-29 DIAGNOSIS — Z85820 Personal history of malignant melanoma of skin: Secondary | ICD-10-CM | POA: Diagnosis not present

## 2014-04-29 DIAGNOSIS — D1801 Hemangioma of skin and subcutaneous tissue: Secondary | ICD-10-CM | POA: Diagnosis not present

## 2014-04-29 DIAGNOSIS — L570 Actinic keratosis: Secondary | ICD-10-CM | POA: Diagnosis not present

## 2014-04-29 DIAGNOSIS — D239 Other benign neoplasm of skin, unspecified: Secondary | ICD-10-CM | POA: Diagnosis not present

## 2014-04-29 DIAGNOSIS — D2370 Other benign neoplasm of skin of unspecified lower limb, including hip: Secondary | ICD-10-CM | POA: Diagnosis not present

## 2014-04-30 ENCOUNTER — Encounter (HOSPITAL_COMMUNITY)
Admission: RE | Admit: 2014-04-30 | Discharge: 2014-04-30 | Disposition: A | Discharge status: Home / Self Care | Payer: Self-pay | Source: Ambulatory Visit | Attending: Interventional Cardiology | Admitting: Interventional Cardiology

## 2014-05-01 ENCOUNTER — Encounter (HOSPITAL_COMMUNITY)
Admission: RE | Admit: 2014-05-01 | Discharge: 2014-05-01 | Disposition: A | Discharge status: Home / Self Care | Payer: Self-pay | Source: Ambulatory Visit | Attending: Interventional Cardiology | Admitting: Interventional Cardiology

## 2014-05-02 ENCOUNTER — Encounter (HOSPITAL_COMMUNITY): Payer: Self-pay

## 2014-05-06 ENCOUNTER — Encounter (HOSPITAL_COMMUNITY): Payer: Self-pay

## 2014-05-08 ENCOUNTER — Encounter (HOSPITAL_COMMUNITY): Payer: Self-pay

## 2014-05-09 ENCOUNTER — Encounter (HOSPITAL_COMMUNITY): Payer: Self-pay

## 2014-05-13 ENCOUNTER — Encounter (HOSPITAL_COMMUNITY)
Admission: RE | Admit: 2014-05-13 | Discharge: 2014-05-13 | Disposition: A | Discharge status: Home / Self Care | Payer: Self-pay | Source: Ambulatory Visit | Attending: Interventional Cardiology | Admitting: Interventional Cardiology

## 2014-05-13 DIAGNOSIS — I10 Essential (primary) hypertension: Secondary | ICD-10-CM | POA: Insufficient documentation

## 2014-05-13 DIAGNOSIS — Z5189 Encounter for other specified aftercare: Secondary | ICD-10-CM | POA: Insufficient documentation

## 2014-05-13 DIAGNOSIS — I2510 Atherosclerotic heart disease of native coronary artery without angina pectoris: Secondary | ICD-10-CM | POA: Insufficient documentation

## 2014-05-15 ENCOUNTER — Encounter (HOSPITAL_COMMUNITY)
Admission: RE | Admit: 2014-05-15 | Discharge: 2014-05-15 | Disposition: A | Discharge status: Home / Self Care | Payer: Self-pay | Source: Ambulatory Visit | Attending: Interventional Cardiology | Admitting: Interventional Cardiology

## 2014-05-16 ENCOUNTER — Encounter (HOSPITAL_COMMUNITY)
Admission: RE | Admit: 2014-05-16 | Discharge: 2014-05-16 | Disposition: A | Discharge status: Home / Self Care | Payer: Self-pay | Source: Ambulatory Visit | Attending: Interventional Cardiology | Admitting: Interventional Cardiology

## 2014-05-20 ENCOUNTER — Encounter (HOSPITAL_COMMUNITY)
Admission: RE | Admit: 2014-05-20 | Discharge: 2014-05-20 | Disposition: A | Discharge status: Home / Self Care | Payer: Self-pay | Source: Ambulatory Visit | Attending: Interventional Cardiology | Admitting: Interventional Cardiology

## 2014-05-21 ENCOUNTER — Encounter (HOSPITAL_COMMUNITY)
Admission: RE | Admit: 2014-05-21 | Discharge: 2014-05-21 | Disposition: A | Discharge status: Home / Self Care | Payer: Self-pay | Source: Ambulatory Visit | Attending: Interventional Cardiology | Admitting: Interventional Cardiology

## 2014-05-22 ENCOUNTER — Encounter (HOSPITAL_COMMUNITY): Payer: Self-pay

## 2014-05-23 ENCOUNTER — Encounter (HOSPITAL_COMMUNITY): Payer: Self-pay

## 2014-05-27 ENCOUNTER — Encounter (HOSPITAL_COMMUNITY)
Admission: RE | Admit: 2014-05-27 | Discharge: 2014-05-27 | Disposition: A | Discharge status: Home / Self Care | Payer: Self-pay | Source: Ambulatory Visit | Attending: Interventional Cardiology | Admitting: Interventional Cardiology

## 2014-05-29 ENCOUNTER — Encounter (HOSPITAL_COMMUNITY)
Admission: RE | Admit: 2014-05-29 | Discharge: 2014-05-29 | Disposition: A | Discharge status: Home / Self Care | Payer: Self-pay | Source: Ambulatory Visit | Attending: Interventional Cardiology | Admitting: Interventional Cardiology

## 2014-05-30 ENCOUNTER — Encounter (HOSPITAL_COMMUNITY)
Admission: RE | Admit: 2014-05-30 | Discharge: 2014-05-30 | Disposition: A | Discharge status: Home / Self Care | Payer: Self-pay | Source: Ambulatory Visit | Attending: Interventional Cardiology | Admitting: Interventional Cardiology

## 2014-06-03 ENCOUNTER — Encounter (HOSPITAL_COMMUNITY)
Admission: RE | Admit: 2014-06-03 | Discharge: 2014-06-03 | Disposition: A | Discharge status: Home / Self Care | Payer: Self-pay | Source: Ambulatory Visit | Attending: Interventional Cardiology | Admitting: Interventional Cardiology

## 2014-06-05 ENCOUNTER — Encounter (HOSPITAL_COMMUNITY)
Admission: RE | Admit: 2014-06-05 | Discharge: 2014-06-05 | Disposition: A | Discharge status: Home / Self Care | Payer: Self-pay | Source: Ambulatory Visit | Attending: Interventional Cardiology | Admitting: Interventional Cardiology

## 2014-06-06 ENCOUNTER — Encounter (HOSPITAL_COMMUNITY)
Admission: RE | Admit: 2014-06-06 | Discharge: 2014-06-06 | Disposition: A | Discharge status: Home / Self Care | Payer: Self-pay | Source: Ambulatory Visit | Attending: Interventional Cardiology | Admitting: Interventional Cardiology

## 2014-06-09 ENCOUNTER — Encounter (HOSPITAL_COMMUNITY)
Admission: RE | Admit: 2014-06-09 | Discharge: 2014-06-09 | Disposition: A | Discharge status: Home / Self Care | Payer: Self-pay | Source: Ambulatory Visit | Attending: Interventional Cardiology | Admitting: Interventional Cardiology

## 2014-06-10 ENCOUNTER — Encounter (HOSPITAL_COMMUNITY)
Admission: RE | Admit: 2014-06-10 | Discharge: 2014-06-10 | Disposition: A | Discharge status: Home / Self Care | Payer: Self-pay | Source: Ambulatory Visit | Attending: Interventional Cardiology | Admitting: Interventional Cardiology

## 2014-06-10 ENCOUNTER — Ambulatory Visit: Payer: Self-pay | Admitting: Interventional Cardiology

## 2014-06-12 ENCOUNTER — Encounter (HOSPITAL_COMMUNITY)
Admission: RE | Admit: 2014-06-12 | Discharge: 2014-06-12 | Disposition: A | Discharge status: Home / Self Care | Payer: Self-pay | Source: Ambulatory Visit | Attending: Interventional Cardiology | Admitting: Interventional Cardiology

## 2014-06-12 DIAGNOSIS — IMO0002 Reserved for concepts with insufficient information to code with codable children: Secondary | ICD-10-CM | POA: Diagnosis not present

## 2014-06-12 DIAGNOSIS — I10 Essential (primary) hypertension: Secondary | ICD-10-CM | POA: Insufficient documentation

## 2014-06-12 DIAGNOSIS — I2510 Atherosclerotic heart disease of native coronary artery without angina pectoris: Secondary | ICD-10-CM | POA: Insufficient documentation

## 2014-06-12 DIAGNOSIS — Z5189 Encounter for other specified aftercare: Secondary | ICD-10-CM | POA: Insufficient documentation

## 2014-06-17 ENCOUNTER — Encounter (HOSPITAL_COMMUNITY)
Admission: RE | Admit: 2014-06-17 | Discharge: 2014-06-17 | Disposition: A | Discharge status: Home / Self Care | Payer: Self-pay | Source: Ambulatory Visit | Attending: Interventional Cardiology | Admitting: Interventional Cardiology

## 2014-06-19 ENCOUNTER — Encounter: Payer: Self-pay | Admitting: Interventional Cardiology

## 2014-06-19 ENCOUNTER — Ambulatory Visit (INDEPENDENT_AMBULATORY_CARE_PROVIDER_SITE_OTHER): Payer: Medicare Other | Admitting: Interventional Cardiology

## 2014-06-19 ENCOUNTER — Encounter (HOSPITAL_COMMUNITY)
Admission: RE | Admit: 2014-06-19 | Discharge: 2014-06-19 | Disposition: A | Discharge status: Home / Self Care | Payer: Self-pay | Source: Ambulatory Visit | Attending: Interventional Cardiology | Admitting: Interventional Cardiology

## 2014-06-19 VITALS — BP 136/69 | HR 79 | Ht 70.0 in | Wt 177.1 lb

## 2014-06-19 DIAGNOSIS — E785 Hyperlipidemia, unspecified: Secondary | ICD-10-CM | POA: Diagnosis not present

## 2014-06-19 DIAGNOSIS — I10 Essential (primary) hypertension: Secondary | ICD-10-CM

## 2014-06-19 DIAGNOSIS — I25810 Atherosclerosis of coronary artery bypass graft(s) without angina pectoris: Secondary | ICD-10-CM | POA: Diagnosis not present

## 2014-06-19 NOTE — Patient Instructions (Signed)
Your physician recommends that you continue on your current medications as directed. Please refer to the Current Medication list given to you today.  Your physician wants you to follow-up in: 1 year. You will receive a reminder letter in the mail two months in advance. If you don't receive a letter, please call our office to schedule the follow-up appointment.  

## 2014-06-19 NOTE — Progress Notes (Signed)
Patient ID: Nathan [name], male   DOB: Dec 11, 1946, 68 y.o.   MRN: [id number]    [address] [place], [place]  [address] Phone: [phone] Fax:  [phone]  Date:  06/19/2014   ID:  Nathan [name], DOB 1946-02-23, MRN [id number]  PCP:  Garlan Fair, MD   ASSESSMENT:  1. CAD, status post bypass without angina 2. Hypertension, controlled 3. Hyperlipidemia, controlled with most recent laboratory data in March at target   PLAN:  1. Continue active lifestyle 2. Clinical followup in one year 3. No change in current medical regimen   SUBJECTIVE: Nathan [name] is a 68 y.o. male was doing well. In phase 2 cardiac rehabilitation now for 10 years. Weight is under excellent control. No angina. No exertional intolerance. Denies dyspnea.   Wt Readings from Last 3 Encounters:  06/19/14 177 lb 1.9 oz (80.341 kg)  04/23/14 179 lb (81.194 kg)  04/01/14 178 lb (80.74 kg)     Past Medical History  Diagnosis Date  . Arthritis   . Blood transfusion without reported diagnosis   . Wears glasses   . Coronary artery disease   . GERD (gastroesophageal reflux disease)   . Seasonal allergies     Current Outpatient Prescriptions  Medication Sig Dispense Refill  . atorvastatin (LIPITOR) 10 MG tablet Take 10 mg by mouth daily.       . cetirizine (ZYRTEC) 10 MG tablet Take 10 mg by mouth daily.      . cycloSPORINE (RESTASIS) 0.05 % ophthalmic emulsion 1 drop 2 (two) times daily.      Marland Kitchen HYDROcodone-acetaminophen (LORTAB 7.5) 7.5-500 MG per tablet Take 1 tablet by mouth every 6 (six) hours as needed for pain.  30 tablet  0  . omeprazole (PRILOSEC) 10 MG capsule Take 20 mg by mouth daily. Pt stated he rarely uses this mediation      . TGT ASPIRIN LOW DOSE 81 MG EC tablet Take 81 mg by mouth daily.       Marland Kitchen triamcinolone (NASACORT) 55 MCG/ACT AERO nasal inhaler Place 2 sprays into the nose daily.       No current facility-administered medications for this visit.    Allergies:     Allergies  Allergen Reactions  . Bystolic [Nebivolol Hcl] Rash  . Neosporin [Neomycin-Bacitracin Zn-Polymyx]   . Pravastatin   . Toprol Xl [Metoprolol Tartrate]     Social History:  The patient  reports that he quit smoking about 45 years ago. He does not have any smokeless tobacco history on file. He reports that he drinks alcohol. He reports that he does not use illicit drugs.   ROS:  Please see the history of present illness.   No blood in urine or stool. Denies orthopnea, PND, neurological complaints   All other systems reviewed and negative.   OBJECTIVE: VS:  BP 136/69  Pulse 79  Ht 5\' 10"  (1.778 m)  Wt 177 lb 1.9 oz (80.341 kg)  BMI 25.41 kg/m2 Well nourished, well developed, in no acute distress, appears than his stated age 49: normal Neck: JVD flat. Carotid bruit absent  Cardiac:  normal S1, S2; RRR; no murmur Lungs:  clear to auscultation bilaterally, no wheezing, rhonchi or rales Abd: soft, nontender, no hepatomegaly Ext: Edema absent. Pulses strong 2+ bilateral Skin: warm and dry Neuro:  CNs 2-12 intact, no focal abnormalities noted  EKG:  Normal       Signed, Illene Labrador III, MD 06/19/2014 3:31  PM

## 2014-06-20 ENCOUNTER — Encounter (HOSPITAL_COMMUNITY)
Admission: RE | Admit: 2014-06-20 | Discharge: 2014-06-20 | Disposition: A | Discharge status: Home / Self Care | Payer: Self-pay | Source: Ambulatory Visit | Attending: Interventional Cardiology | Admitting: Interventional Cardiology

## 2014-06-21 DIAGNOSIS — M1710 Unilateral primary osteoarthritis, unspecified knee: Secondary | ICD-10-CM | POA: Diagnosis not present

## 2014-06-24 ENCOUNTER — Encounter (HOSPITAL_COMMUNITY)
Admission: RE | Admit: 2014-06-24 | Discharge: 2014-06-24 | Disposition: A | Discharge status: Home / Self Care | Payer: Self-pay | Source: Ambulatory Visit | Attending: Interventional Cardiology | Admitting: Interventional Cardiology

## 2014-06-24 DIAGNOSIS — M1710 Unilateral primary osteoarthritis, unspecified knee: Secondary | ICD-10-CM | POA: Diagnosis not present

## 2014-06-26 ENCOUNTER — Encounter (HOSPITAL_COMMUNITY)
Admission: RE | Admit: 2014-06-26 | Discharge: 2014-06-26 | Disposition: A | Discharge status: Home / Self Care | Payer: Self-pay | Source: Ambulatory Visit | Attending: Interventional Cardiology | Admitting: Interventional Cardiology

## 2014-06-27 ENCOUNTER — Encounter (HOSPITAL_COMMUNITY)
Admission: RE | Admit: 2014-06-27 | Discharge: 2014-06-27 | Disposition: A | Discharge status: Home / Self Care | Payer: Self-pay | Source: Ambulatory Visit | Attending: Interventional Cardiology | Admitting: Interventional Cardiology

## 2014-06-30 ENCOUNTER — Telehealth: Payer: Self-pay | Admitting: Interventional Cardiology

## 2014-06-30 ENCOUNTER — Other Ambulatory Visit: Payer: Self-pay | Admitting: Interventional Cardiology

## 2014-06-30 NOTE — Telephone Encounter (Signed)
Received request from Nurse fax box, documents faxed for surgical clearance. To: [place] Fax number: [phone] Attention: 7.20.15/km

## 2014-06-30 NOTE — Telephone Encounter (Signed)
Nathan Crome III, MD at 06/19/2014  3:31 PM atorvastatin (LIPITOR) 10 MG tablet  Take 10 mg by mouth daily.   Your physician recommends that you continue on your current medications as directed. Please refer to the Current Medication list given to you today.

## 2014-07-01 ENCOUNTER — Encounter (HOSPITAL_COMMUNITY)
Admission: RE | Admit: 2014-07-01 | Discharge: 2014-07-01 | Disposition: A | Discharge status: Home / Self Care | Payer: Self-pay | Source: Ambulatory Visit | Attending: Interventional Cardiology | Admitting: Interventional Cardiology

## 2014-07-03 ENCOUNTER — Encounter (HOSPITAL_COMMUNITY)
Admission: RE | Admit: 2014-07-03 | Discharge: 2014-07-03 | Disposition: A | Discharge status: Home / Self Care | Payer: Self-pay | Source: Ambulatory Visit | Attending: Interventional Cardiology | Admitting: Interventional Cardiology

## 2014-07-04 ENCOUNTER — Encounter (HOSPITAL_COMMUNITY)
Admission: RE | Admit: 2014-07-04 | Discharge: 2014-07-04 | Disposition: A | Discharge status: Home / Self Care | Payer: Self-pay | Source: Ambulatory Visit | Attending: Interventional Cardiology | Admitting: Interventional Cardiology

## 2014-07-08 ENCOUNTER — Encounter (HOSPITAL_COMMUNITY)
Admission: RE | Admit: 2014-07-08 | Discharge: 2014-07-08 | Disposition: A | Discharge status: Home / Self Care | Payer: Self-pay | Source: Ambulatory Visit | Attending: Interventional Cardiology | Admitting: Interventional Cardiology

## 2014-07-10 ENCOUNTER — Encounter (HOSPITAL_COMMUNITY)
Admission: RE | Admit: 2014-07-10 | Discharge: 2014-07-10 | Disposition: A | Discharge status: Home / Self Care | Payer: Self-pay | Source: Ambulatory Visit | Attending: Interventional Cardiology | Admitting: Interventional Cardiology

## 2014-07-11 ENCOUNTER — Encounter (HOSPITAL_COMMUNITY)
Admission: RE | Admit: 2014-07-11 | Discharge: 2014-07-11 | Disposition: A | Discharge status: Home / Self Care | Payer: Self-pay | Source: Ambulatory Visit | Attending: Interventional Cardiology | Admitting: Interventional Cardiology

## 2014-07-15 ENCOUNTER — Encounter (HOSPITAL_COMMUNITY): Payer: Self-pay

## 2014-07-15 DIAGNOSIS — I10 Essential (primary) hypertension: Secondary | ICD-10-CM | POA: Insufficient documentation

## 2014-07-15 DIAGNOSIS — Z5189 Encounter for other specified aftercare: Secondary | ICD-10-CM | POA: Insufficient documentation

## 2014-07-15 DIAGNOSIS — I2510 Atherosclerotic heart disease of native coronary artery without angina pectoris: Secondary | ICD-10-CM | POA: Insufficient documentation

## 2014-07-17 ENCOUNTER — Encounter (HOSPITAL_COMMUNITY): Payer: Self-pay

## 2014-07-18 ENCOUNTER — Encounter (HOSPITAL_COMMUNITY)
Admission: RE | Admit: 2014-07-18 | Discharge: 2014-07-18 | Disposition: A | Discharge status: Home / Self Care | Payer: Self-pay | Source: Ambulatory Visit | Attending: Interventional Cardiology | Admitting: Interventional Cardiology

## 2014-07-22 ENCOUNTER — Encounter (HOSPITAL_COMMUNITY)
Admission: RE | Admit: 2014-07-22 | Discharge: 2014-07-22 | Disposition: A | Discharge status: Home / Self Care | Payer: Self-pay | Source: Ambulatory Visit | Attending: Interventional Cardiology | Admitting: Interventional Cardiology

## 2014-07-24 ENCOUNTER — Encounter (HOSPITAL_COMMUNITY)
Admission: RE | Admit: 2014-07-24 | Discharge: 2014-07-24 | Disposition: A | Discharge status: Home / Self Care | Payer: Self-pay | Source: Ambulatory Visit | Attending: Interventional Cardiology | Admitting: Interventional Cardiology

## 2014-07-25 ENCOUNTER — Encounter (HOSPITAL_COMMUNITY)
Admission: RE | Admit: 2014-07-25 | Discharge: 2014-07-25 | Disposition: A | Discharge status: Home / Self Care | Payer: Self-pay | Source: Ambulatory Visit | Attending: Interventional Cardiology | Admitting: Interventional Cardiology

## 2014-07-25 DIAGNOSIS — M1710 Unilateral primary osteoarthritis, unspecified knee: Secondary | ICD-10-CM | POA: Diagnosis not present

## 2014-07-29 ENCOUNTER — Encounter (HOSPITAL_COMMUNITY)
Admission: RE | Admit: 2014-07-29 | Discharge: 2014-07-29 | Disposition: A | Discharge status: Home / Self Care | Payer: Self-pay | Source: Ambulatory Visit | Attending: Interventional Cardiology | Admitting: Interventional Cardiology

## 2014-07-30 ENCOUNTER — Encounter (HOSPITAL_COMMUNITY)
Admission: RE | Admit: 2014-07-30 | Discharge: 2014-07-30 | Disposition: A | Discharge status: Home / Self Care | Payer: Self-pay | Source: Ambulatory Visit | Attending: Interventional Cardiology | Admitting: Interventional Cardiology

## 2014-07-31 ENCOUNTER — Encounter (HOSPITAL_COMMUNITY)
Admission: RE | Admit: 2014-07-31 | Discharge: 2014-07-31 | Disposition: A | Discharge status: Home / Self Care | Payer: Self-pay | Source: Ambulatory Visit | Attending: Interventional Cardiology | Admitting: Interventional Cardiology

## 2014-08-01 ENCOUNTER — Encounter (HOSPITAL_COMMUNITY): Payer: Self-pay

## 2014-08-05 ENCOUNTER — Encounter (HOSPITAL_COMMUNITY)
Admission: RE | Admit: 2014-08-05 | Discharge: 2014-08-05 | Disposition: A | Discharge status: Home / Self Care | Payer: Self-pay | Source: Ambulatory Visit | Attending: Interventional Cardiology | Admitting: Interventional Cardiology

## 2014-08-07 ENCOUNTER — Encounter (HOSPITAL_COMMUNITY)
Admission: RE | Admit: 2014-08-07 | Discharge: 2014-08-07 | Disposition: A | Discharge status: Home / Self Care | Payer: Self-pay | Source: Ambulatory Visit | Attending: Interventional Cardiology | Admitting: Interventional Cardiology

## 2014-08-08 ENCOUNTER — Encounter (HOSPITAL_COMMUNITY)
Admission: RE | Admit: 2014-08-08 | Discharge: 2014-08-08 | Disposition: A | Discharge status: Home / Self Care | Payer: Self-pay | Source: Ambulatory Visit | Attending: Interventional Cardiology | Admitting: Interventional Cardiology

## 2014-08-11 ENCOUNTER — Encounter (HOSPITAL_COMMUNITY)
Admission: RE | Admit: 2014-08-11 | Discharge: 2014-08-11 | Disposition: A | Discharge status: Home / Self Care | Payer: Self-pay | Source: Ambulatory Visit | Attending: Interventional Cardiology | Admitting: Interventional Cardiology

## 2014-08-12 ENCOUNTER — Encounter (HOSPITAL_COMMUNITY): Payer: Medicare Other | Attending: Interventional Cardiology

## 2014-08-12 DIAGNOSIS — I10 Essential (primary) hypertension: Secondary | ICD-10-CM | POA: Insufficient documentation

## 2014-08-12 DIAGNOSIS — Z5189 Encounter for other specified aftercare: Secondary | ICD-10-CM | POA: Insufficient documentation

## 2014-08-12 DIAGNOSIS — M25569 Pain in unspecified knee: Secondary | ICD-10-CM | POA: Diagnosis not present

## 2014-08-12 DIAGNOSIS — I2510 Atherosclerotic heart disease of native coronary artery without angina pectoris: Secondary | ICD-10-CM | POA: Insufficient documentation

## 2014-08-12 DIAGNOSIS — R262 Difficulty in walking, not elsewhere classified: Secondary | ICD-10-CM | POA: Diagnosis not present

## 2014-08-14 ENCOUNTER — Encounter (HOSPITAL_COMMUNITY): Payer: Self-pay

## 2014-08-15 ENCOUNTER — Encounter (HOSPITAL_COMMUNITY): Payer: Self-pay

## 2014-08-19 ENCOUNTER — Encounter (HOSPITAL_COMMUNITY): Payer: Self-pay

## 2014-08-21 ENCOUNTER — Encounter (HOSPITAL_COMMUNITY): Payer: Self-pay

## 2014-08-22 ENCOUNTER — Encounter (HOSPITAL_COMMUNITY): Admission: RE | Admit: 2014-08-22 | Payer: Self-pay | Source: Ambulatory Visit

## 2014-08-26 ENCOUNTER — Encounter (HOSPITAL_COMMUNITY): Payer: Self-pay

## 2014-08-28 ENCOUNTER — Encounter (HOSPITAL_COMMUNITY): Payer: Self-pay

## 2014-08-29 ENCOUNTER — Encounter (HOSPITAL_COMMUNITY): Payer: Self-pay

## 2014-09-02 ENCOUNTER — Encounter (HOSPITAL_COMMUNITY): Payer: Self-pay

## 2014-09-04 ENCOUNTER — Encounter (HOSPITAL_COMMUNITY): Payer: Self-pay

## 2014-09-05 ENCOUNTER — Encounter (HOSPITAL_COMMUNITY): Payer: Self-pay

## 2014-09-05 DIAGNOSIS — S058X9A Other injuries of unspecified eye and orbit, initial encounter: Secondary | ICD-10-CM | POA: Diagnosis not present

## 2014-09-05 DIAGNOSIS — H2510 Age-related nuclear cataract, unspecified eye: Secondary | ICD-10-CM | POA: Diagnosis not present

## 2014-09-08 DIAGNOSIS — M9420 Chondromalacia, unspecified site: Secondary | ICD-10-CM | POA: Diagnosis not present

## 2014-09-08 DIAGNOSIS — M2240 Chondromalacia patellae, unspecified knee: Secondary | ICD-10-CM | POA: Diagnosis not present

## 2014-09-08 DIAGNOSIS — M13169 Monoarthritis, not elsewhere classified, unspecified knee: Secondary | ICD-10-CM | POA: Diagnosis not present

## 2014-09-08 DIAGNOSIS — Y929 Unspecified place or not applicable: Secondary | ICD-10-CM | POA: Diagnosis not present

## 2014-09-08 DIAGNOSIS — Y999 Unspecified external cause status: Secondary | ICD-10-CM | POA: Diagnosis not present

## 2014-09-08 DIAGNOSIS — G8918 Other acute postprocedural pain: Secondary | ICD-10-CM | POA: Diagnosis not present

## 2014-09-08 DIAGNOSIS — Y939 Activity, unspecified: Secondary | ICD-10-CM | POA: Diagnosis not present

## 2014-09-08 DIAGNOSIS — M1710 Unilateral primary osteoarthritis, unspecified knee: Secondary | ICD-10-CM | POA: Diagnosis not present

## 2014-09-08 DIAGNOSIS — IMO0002 Reserved for concepts with insufficient information to code with codable children: Secondary | ICD-10-CM | POA: Diagnosis not present

## 2014-09-08 DIAGNOSIS — M959 Acquired deformity of musculoskeletal system, unspecified: Secondary | ICD-10-CM | POA: Diagnosis not present

## 2014-09-08 DIAGNOSIS — X58XXXA Exposure to other specified factors, initial encounter: Secondary | ICD-10-CM | POA: Diagnosis not present

## 2014-09-08 DIAGNOSIS — S83289A Other tear of lateral meniscus, current injury, unspecified knee, initial encounter: Secondary | ICD-10-CM | POA: Diagnosis not present

## 2014-09-09 ENCOUNTER — Encounter (HOSPITAL_COMMUNITY): Payer: Self-pay

## 2014-09-11 ENCOUNTER — Encounter (HOSPITAL_COMMUNITY): Admission: RE | Admit: 2014-09-11 | Payer: Self-pay | Source: Ambulatory Visit

## 2014-09-12 ENCOUNTER — Encounter (HOSPITAL_COMMUNITY): Payer: Medicare Other | Attending: Interventional Cardiology

## 2014-09-16 ENCOUNTER — Encounter (HOSPITAL_COMMUNITY): Payer: Self-pay

## 2014-09-17 DIAGNOSIS — R262 Difficulty in walking, not elsewhere classified: Secondary | ICD-10-CM | POA: Diagnosis not present

## 2014-09-17 DIAGNOSIS — M25561 Pain in right knee: Secondary | ICD-10-CM | POA: Diagnosis not present

## 2014-09-17 DIAGNOSIS — S83271D Complex tear of lateral meniscus, current injury, right knee, subsequent encounter: Secondary | ICD-10-CM | POA: Diagnosis not present

## 2014-09-18 ENCOUNTER — Encounter (HOSPITAL_COMMUNITY): Payer: Self-pay

## 2014-09-19 ENCOUNTER — Encounter (HOSPITAL_COMMUNITY): Payer: Self-pay

## 2014-09-23 ENCOUNTER — Encounter (HOSPITAL_COMMUNITY): Payer: Self-pay

## 2014-09-25 ENCOUNTER — Encounter (HOSPITAL_COMMUNITY): Payer: Self-pay

## 2014-09-25 DIAGNOSIS — M25561 Pain in right knee: Secondary | ICD-10-CM | POA: Diagnosis not present

## 2014-09-25 DIAGNOSIS — S83271D Complex tear of lateral meniscus, current injury, right knee, subsequent encounter: Secondary | ICD-10-CM | POA: Diagnosis not present

## 2014-09-25 DIAGNOSIS — R262 Difficulty in walking, not elsewhere classified: Secondary | ICD-10-CM | POA: Diagnosis not present

## 2014-09-26 ENCOUNTER — Encounter (HOSPITAL_COMMUNITY): Payer: Self-pay

## 2014-09-30 ENCOUNTER — Encounter (HOSPITAL_COMMUNITY): Payer: Self-pay

## 2014-10-01 DIAGNOSIS — M25561 Pain in right knee: Secondary | ICD-10-CM | POA: Diagnosis not present

## 2014-10-01 DIAGNOSIS — S83271D Complex tear of lateral meniscus, current injury, right knee, subsequent encounter: Secondary | ICD-10-CM | POA: Diagnosis not present

## 2014-10-01 DIAGNOSIS — R262 Difficulty in walking, not elsewhere classified: Secondary | ICD-10-CM | POA: Diagnosis not present

## 2014-10-02 ENCOUNTER — Encounter (HOSPITAL_COMMUNITY): Payer: Self-pay

## 2014-10-03 ENCOUNTER — Encounter (HOSPITAL_COMMUNITY): Payer: Self-pay

## 2014-10-07 ENCOUNTER — Encounter (HOSPITAL_COMMUNITY): Payer: Self-pay

## 2014-10-09 ENCOUNTER — Encounter (HOSPITAL_COMMUNITY): Payer: Self-pay

## 2014-10-10 ENCOUNTER — Encounter (HOSPITAL_COMMUNITY): Payer: Self-pay

## 2014-10-22 DIAGNOSIS — R262 Difficulty in walking, not elsewhere classified: Secondary | ICD-10-CM | POA: Diagnosis not present

## 2014-10-22 DIAGNOSIS — S83271D Complex tear of lateral meniscus, current injury, right knee, subsequent encounter: Secondary | ICD-10-CM | POA: Diagnosis not present

## 2014-10-22 DIAGNOSIS — M25561 Pain in right knee: Secondary | ICD-10-CM | POA: Diagnosis not present

## 2014-10-23 DIAGNOSIS — L817 Pigmented purpuric dermatosis: Secondary | ICD-10-CM | POA: Diagnosis not present

## 2014-10-28 DIAGNOSIS — R262 Difficulty in walking, not elsewhere classified: Secondary | ICD-10-CM | POA: Diagnosis not present

## 2014-10-28 DIAGNOSIS — S83271D Complex tear of lateral meniscus, current injury, right knee, subsequent encounter: Secondary | ICD-10-CM | POA: Diagnosis not present

## 2014-10-28 DIAGNOSIS — M25561 Pain in right knee: Secondary | ICD-10-CM | POA: Diagnosis not present

## 2014-10-30 DIAGNOSIS — S83271D Complex tear of lateral meniscus, current injury, right knee, subsequent encounter: Secondary | ICD-10-CM | POA: Diagnosis not present

## 2014-10-30 DIAGNOSIS — R262 Difficulty in walking, not elsewhere classified: Secondary | ICD-10-CM | POA: Diagnosis not present

## 2014-10-30 DIAGNOSIS — M25561 Pain in right knee: Secondary | ICD-10-CM | POA: Diagnosis not present

## 2014-11-03 DIAGNOSIS — S83271D Complex tear of lateral meniscus, current injury, right knee, subsequent encounter: Secondary | ICD-10-CM | POA: Diagnosis not present

## 2014-11-03 DIAGNOSIS — R262 Difficulty in walking, not elsewhere classified: Secondary | ICD-10-CM | POA: Diagnosis not present

## 2014-11-03 DIAGNOSIS — M25561 Pain in right knee: Secondary | ICD-10-CM | POA: Diagnosis not present

## 2014-11-11 DIAGNOSIS — R262 Difficulty in walking, not elsewhere classified: Secondary | ICD-10-CM | POA: Diagnosis not present

## 2014-11-11 DIAGNOSIS — M25561 Pain in right knee: Secondary | ICD-10-CM | POA: Diagnosis not present

## 2014-11-11 DIAGNOSIS — S83271D Complex tear of lateral meniscus, current injury, right knee, subsequent encounter: Secondary | ICD-10-CM | POA: Diagnosis not present

## 2014-11-12 DIAGNOSIS — C44219 Basal cell carcinoma of skin of left ear and external auricular canal: Secondary | ICD-10-CM | POA: Diagnosis not present

## 2014-11-12 DIAGNOSIS — L821 Other seborrheic keratosis: Secondary | ICD-10-CM | POA: Diagnosis not present

## 2014-11-12 DIAGNOSIS — L820 Inflamed seborrheic keratosis: Secondary | ICD-10-CM | POA: Diagnosis not present

## 2014-11-12 DIAGNOSIS — C4441 Basal cell carcinoma of skin of scalp and neck: Secondary | ICD-10-CM | POA: Diagnosis not present

## 2014-11-12 DIAGNOSIS — D2262 Melanocytic nevi of left upper limb, including shoulder: Secondary | ICD-10-CM | POA: Diagnosis not present

## 2014-11-12 DIAGNOSIS — L80 Vitiligo: Secondary | ICD-10-CM | POA: Diagnosis not present

## 2014-11-12 DIAGNOSIS — D2272 Melanocytic nevi of left lower limb, including hip: Secondary | ICD-10-CM | POA: Diagnosis not present

## 2014-11-12 DIAGNOSIS — C44319 Basal cell carcinoma of skin of other parts of face: Secondary | ICD-10-CM | POA: Diagnosis not present

## 2014-11-12 DIAGNOSIS — D2271 Melanocytic nevi of right lower limb, including hip: Secondary | ICD-10-CM | POA: Diagnosis not present

## 2014-11-12 DIAGNOSIS — D225 Melanocytic nevi of trunk: Secondary | ICD-10-CM | POA: Diagnosis not present

## 2014-11-12 DIAGNOSIS — D1801 Hemangioma of skin and subcutaneous tissue: Secondary | ICD-10-CM | POA: Diagnosis not present

## 2014-11-12 DIAGNOSIS — D2261 Melanocytic nevi of right upper limb, including shoulder: Secondary | ICD-10-CM | POA: Diagnosis not present

## 2014-11-13 DIAGNOSIS — R262 Difficulty in walking, not elsewhere classified: Secondary | ICD-10-CM | POA: Diagnosis not present

## 2014-11-13 DIAGNOSIS — M25561 Pain in right knee: Secondary | ICD-10-CM | POA: Diagnosis not present

## 2014-11-13 DIAGNOSIS — S83271D Complex tear of lateral meniscus, current injury, right knee, subsequent encounter: Secondary | ICD-10-CM | POA: Diagnosis not present

## 2014-11-18 DIAGNOSIS — S83271D Complex tear of lateral meniscus, current injury, right knee, subsequent encounter: Secondary | ICD-10-CM | POA: Diagnosis not present

## 2014-11-18 DIAGNOSIS — M25561 Pain in right knee: Secondary | ICD-10-CM | POA: Diagnosis not present

## 2014-11-18 DIAGNOSIS — R262 Difficulty in walking, not elsewhere classified: Secondary | ICD-10-CM | POA: Diagnosis not present

## 2014-11-20 DIAGNOSIS — R262 Difficulty in walking, not elsewhere classified: Secondary | ICD-10-CM | POA: Diagnosis not present

## 2014-11-20 DIAGNOSIS — M25561 Pain in right knee: Secondary | ICD-10-CM | POA: Diagnosis not present

## 2014-11-20 DIAGNOSIS — S83271D Complex tear of lateral meniscus, current injury, right knee, subsequent encounter: Secondary | ICD-10-CM | POA: Diagnosis not present

## 2014-11-25 DIAGNOSIS — M25561 Pain in right knee: Secondary | ICD-10-CM | POA: Diagnosis not present

## 2014-11-25 DIAGNOSIS — S83271D Complex tear of lateral meniscus, current injury, right knee, subsequent encounter: Secondary | ICD-10-CM | POA: Diagnosis not present

## 2014-11-25 DIAGNOSIS — R262 Difficulty in walking, not elsewhere classified: Secondary | ICD-10-CM | POA: Diagnosis not present

## 2014-11-27 DIAGNOSIS — S83271D Complex tear of lateral meniscus, current injury, right knee, subsequent encounter: Secondary | ICD-10-CM | POA: Diagnosis not present

## 2014-11-27 DIAGNOSIS — M25561 Pain in right knee: Secondary | ICD-10-CM | POA: Diagnosis not present

## 2014-11-27 DIAGNOSIS — R262 Difficulty in walking, not elsewhere classified: Secondary | ICD-10-CM | POA: Diagnosis not present

## 2014-11-28 DIAGNOSIS — S83271D Complex tear of lateral meniscus, current injury, right knee, subsequent encounter: Secondary | ICD-10-CM | POA: Diagnosis not present

## 2014-11-28 DIAGNOSIS — M25561 Pain in right knee: Secondary | ICD-10-CM | POA: Diagnosis not present

## 2014-12-01 DIAGNOSIS — R262 Difficulty in walking, not elsewhere classified: Secondary | ICD-10-CM | POA: Diagnosis not present

## 2014-12-01 DIAGNOSIS — S83271D Complex tear of lateral meniscus, current injury, right knee, subsequent encounter: Secondary | ICD-10-CM | POA: Diagnosis not present

## 2014-12-01 DIAGNOSIS — M25561 Pain in right knee: Secondary | ICD-10-CM | POA: Diagnosis not present

## 2014-12-10 DIAGNOSIS — R262 Difficulty in walking, not elsewhere classified: Secondary | ICD-10-CM | POA: Diagnosis not present

## 2014-12-10 DIAGNOSIS — S83271D Complex tear of lateral meniscus, current injury, right knee, subsequent encounter: Secondary | ICD-10-CM | POA: Diagnosis not present

## 2014-12-10 DIAGNOSIS — M25561 Pain in right knee: Secondary | ICD-10-CM | POA: Diagnosis not present

## 2014-12-11 DIAGNOSIS — R262 Difficulty in walking, not elsewhere classified: Secondary | ICD-10-CM | POA: Diagnosis not present

## 2014-12-11 DIAGNOSIS — S83271D Complex tear of lateral meniscus, current injury, right knee, subsequent encounter: Secondary | ICD-10-CM | POA: Diagnosis not present

## 2014-12-11 DIAGNOSIS — M25561 Pain in right knee: Secondary | ICD-10-CM | POA: Diagnosis not present

## 2014-12-16 DIAGNOSIS — M25561 Pain in right knee: Secondary | ICD-10-CM | POA: Diagnosis not present

## 2014-12-16 DIAGNOSIS — S83271D Complex tear of lateral meniscus, current injury, right knee, subsequent encounter: Secondary | ICD-10-CM | POA: Diagnosis not present

## 2014-12-16 DIAGNOSIS — R262 Difficulty in walking, not elsewhere classified: Secondary | ICD-10-CM | POA: Diagnosis not present

## 2014-12-17 DIAGNOSIS — Z23 Encounter for immunization: Secondary | ICD-10-CM | POA: Diagnosis not present

## 2014-12-17 DIAGNOSIS — E780 Pure hypercholesterolemia: Secondary | ICD-10-CM | POA: Diagnosis not present

## 2014-12-17 DIAGNOSIS — Z0001 Encounter for general adult medical examination with abnormal findings: Secondary | ICD-10-CM | POA: Diagnosis not present

## 2014-12-17 DIAGNOSIS — I10 Essential (primary) hypertension: Secondary | ICD-10-CM | POA: Diagnosis not present

## 2014-12-17 DIAGNOSIS — I2510 Atherosclerotic heart disease of native coronary artery without angina pectoris: Secondary | ICD-10-CM | POA: Diagnosis not present

## 2014-12-17 DIAGNOSIS — M4806 Spinal stenosis, lumbar region: Secondary | ICD-10-CM | POA: Diagnosis not present

## 2014-12-17 DIAGNOSIS — Z86010 Personal history of colonic polyps: Secondary | ICD-10-CM | POA: Diagnosis not present

## 2014-12-17 DIAGNOSIS — Z125 Encounter for screening for malignant neoplasm of prostate: Secondary | ICD-10-CM | POA: Diagnosis not present

## 2014-12-18 DIAGNOSIS — R262 Difficulty in walking, not elsewhere classified: Secondary | ICD-10-CM | POA: Diagnosis not present

## 2014-12-18 DIAGNOSIS — M25561 Pain in right knee: Secondary | ICD-10-CM | POA: Diagnosis not present

## 2014-12-18 DIAGNOSIS — S83271D Complex tear of lateral meniscus, current injury, right knee, subsequent encounter: Secondary | ICD-10-CM | POA: Diagnosis not present

## 2014-12-29 DIAGNOSIS — Z85828 Personal history of other malignant neoplasm of skin: Secondary | ICD-10-CM | POA: Diagnosis not present

## 2014-12-29 DIAGNOSIS — C44319 Basal cell carcinoma of skin of other parts of face: Secondary | ICD-10-CM | POA: Diagnosis not present

## 2014-12-30 ENCOUNTER — Other Ambulatory Visit: Payer: Self-pay | Admitting: Interventional Cardiology

## 2015-01-05 DIAGNOSIS — Z85828 Personal history of other malignant neoplasm of skin: Secondary | ICD-10-CM | POA: Diagnosis not present

## 2015-01-05 DIAGNOSIS — C44219 Basal cell carcinoma of skin of left ear and external auricular canal: Secondary | ICD-10-CM | POA: Diagnosis not present

## 2015-01-06 DIAGNOSIS — M25561 Pain in right knee: Secondary | ICD-10-CM | POA: Diagnosis not present

## 2015-01-19 DIAGNOSIS — L570 Actinic keratosis: Secondary | ICD-10-CM | POA: Diagnosis not present

## 2015-01-23 DIAGNOSIS — M1711 Unilateral primary osteoarthritis, right knee: Secondary | ICD-10-CM | POA: Diagnosis not present

## 2015-01-27 DIAGNOSIS — M25561 Pain in right knee: Secondary | ICD-10-CM | POA: Diagnosis not present

## 2015-02-11 ENCOUNTER — Other Ambulatory Visit: Payer: Medicare Other

## 2015-03-05 DIAGNOSIS — J3089 Other allergic rhinitis: Secondary | ICD-10-CM | POA: Diagnosis not present

## 2015-03-05 DIAGNOSIS — H6123 Impacted cerumen, bilateral: Secondary | ICD-10-CM | POA: Diagnosis not present

## 2015-03-16 DIAGNOSIS — L570 Actinic keratosis: Secondary | ICD-10-CM | POA: Diagnosis not present

## 2015-05-15 DIAGNOSIS — R197 Diarrhea, unspecified: Secondary | ICD-10-CM | POA: Diagnosis not present

## 2015-05-15 DIAGNOSIS — J329 Chronic sinusitis, unspecified: Secondary | ICD-10-CM | POA: Diagnosis not present

## 2015-05-25 DIAGNOSIS — D3131 Benign neoplasm of right choroid: Secondary | ICD-10-CM | POA: Diagnosis not present

## 2015-05-25 DIAGNOSIS — H2513 Age-related nuclear cataract, bilateral: Secondary | ICD-10-CM | POA: Diagnosis not present

## 2015-05-25 DIAGNOSIS — H35372 Puckering of macula, left eye: Secondary | ICD-10-CM | POA: Diagnosis not present

## 2015-05-26 DIAGNOSIS — L821 Other seborrheic keratosis: Secondary | ICD-10-CM | POA: Diagnosis not present

## 2015-05-26 DIAGNOSIS — D225 Melanocytic nevi of trunk: Secondary | ICD-10-CM | POA: Diagnosis not present

## 2015-05-26 DIAGNOSIS — D692 Other nonthrombocytopenic purpura: Secondary | ICD-10-CM | POA: Diagnosis not present

## 2015-05-26 DIAGNOSIS — D2272 Melanocytic nevi of left lower limb, including hip: Secondary | ICD-10-CM | POA: Diagnosis not present

## 2015-05-26 DIAGNOSIS — L720 Epidermal cyst: Secondary | ICD-10-CM | POA: Diagnosis not present

## 2015-05-26 DIAGNOSIS — Z85828 Personal history of other malignant neoplasm of skin: Secondary | ICD-10-CM | POA: Diagnosis not present

## 2015-05-26 DIAGNOSIS — L570 Actinic keratosis: Secondary | ICD-10-CM | POA: Diagnosis not present

## 2015-06-21 ENCOUNTER — Other Ambulatory Visit: Payer: Self-pay | Admitting: Interventional Cardiology

## 2015-06-22 ENCOUNTER — Other Ambulatory Visit: Payer: Self-pay

## 2015-06-22 MED ORDER — ATORVASTATIN CALCIUM 10 MG PO TABS: 10.0000 mg | ORAL_TABLET | Freq: Every day | ORAL | Status: DC

## 2015-07-02 ENCOUNTER — Telehealth: Payer: Self-pay | Admitting: Interventional Cardiology

## 2015-07-02 DIAGNOSIS — E785 Hyperlipidemia, unspecified: Secondary | ICD-10-CM

## 2015-07-02 NOTE — Telephone Encounter (Signed)
New Message       Pt calling wanting to know if he needs to have labs done prior to upcoming appt. Please call back and advise.

## 2015-07-02 NOTE — Telephone Encounter (Signed)
Returned pt call. Pt is past due for his fasting lipid and alt. Lab appt sch for 7/28. Pt verbalized understanding

## 2015-07-09 ENCOUNTER — Other Ambulatory Visit (INDEPENDENT_AMBULATORY_CARE_PROVIDER_SITE_OTHER): Payer: Medicare Other | Admitting: *Deleted

## 2015-07-09 ENCOUNTER — Telehealth: Payer: Self-pay

## 2015-07-09 DIAGNOSIS — E785 Hyperlipidemia, unspecified: Secondary | ICD-10-CM

## 2015-07-09 LAB — LIPID PANEL
Cholesterol: 159 mg/dL (ref 0–200)
HDL: 55.3 mg/dL (ref >39.00)
LDL Cholesterol: 88 mg/dL (ref 0–99)
NonHDL: 103.23
TRIGLYCERIDES: 74 mg/dL (ref 0.0–149.0)
Total CHOL/HDL Ratio: 3
VLDL: 14.8 mg/dL (ref 0.0–40.0)

## 2015-07-09 LAB — ALT: ALT: 25 U/L (ref 0–53)

## 2015-07-09 NOTE — Telephone Encounter (Signed)
-----   Message from Belva Crome, MD sent at 07/09/2015 11:55 AM EDT ----- Lipids are at target. Repeat these in one year. Continue the same therapeutic regimen.

## 2015-07-12 NOTE — Progress Notes (Signed)
Cardiology Office Note   Date:  07/13/2015   ID:  Nathan [name], Alferd Apa [birth year], MRN [id number]  PCP:  Garlan Fair, MD  Cardiologist:  Sinclair Grooms, MD   Chief Complaint  Patient presents with  . Coronary Artery Disease      History of Present Illness: Nathan [name] is a 69 y.o. male who presents for CAD, prior CABG, hypertension, hyperlipidemia, and family history of CAD.  Tries have an intermittent episodes of burning in his epigastric area. The symptoms are nonexertional. He continues in phase 3 cardiac rehabilitation and is having no exertional complaints. He states that his current symptoms are concerning to him because prior CABG he had similar complaints although he can't remember specifically.    Past Medical History  Diagnosis Date  . Arthritis   . Blood transfusion without reported diagnosis   . Wears glasses   . Coronary artery disease   . GERD (gastroesophageal reflux disease)   . Seasonal allergies     Past Surgical History  Procedure Laterality Date  . Knee arthrocentesis  1980    right  . Shoulder acromioplasty  1985    rt  . Sinus endo w/fusion    . Tonsillectomy and adenoidectomy    . Exploration post operative open heart    . Appendectomy      age 2  . Colonoscopy    . Eye surgery      detached retina-lt  . Inguinal hernia repair Left 04/01/2014    Procedure: HERNIA REPAIR INGUINAL ADULT;  Surgeon: Shann Medal, MD;  Location: [facility];  Service: General;  Laterality: Left;  . Insertion of mesh Left 04/01/2014    Procedure: INSERTION OF MESH;  Surgeon: Shann Medal, MD;  Location: [facility];  Service: General;  Laterality: Left;     Current Outpatient Prescriptions  Medication Sig Dispense Refill  . atorvastatin (LIPITOR) 10 MG tablet Take 1 tablet (10 mg total) by mouth daily. 90 tablet 0  . cetirizine (ZYRTEC) 10 MG tablet Take 10 mg by mouth daily.    . cycloSPORINE (RESTASIS) 0.05 %  ophthalmic emulsion 1 drop 2 (two) times daily.    . TGT ASPIRIN LOW DOSE 81 MG EC tablet Take 81 mg by mouth daily.     Marland Kitchen triamcinolone (NASACORT) 55 MCG/ACT AERO nasal inhaler Place 2 sprays into the nose daily.     No current facility-administered medications for this visit.    Allergies:   Bystolic; Neosporin; Pravastatin; and Toprol xl    Social History:  The patient  reports that he quit smoking about 46 years ago. He does not have any smokeless tobacco history on file. He reports that he drinks alcohol. He reports that he does not use illicit drugs.   Family History:  The patient's family history includes Cancer in his father; Heart disease in his mother.    ROS:  Please see the history of present illness.   Otherwise, review of systems are positive for constipation and anxiety..   All other systems are reviewed and negative.    PHYSICAL EXAM: VS:  BP 160/80 mmHg  Pulse 74  Ht 5\' 9"  (1.753 m)  Wt 81.058 kg (178 lb 11.2 oz)  BMI 26.38 kg/m2 , BMI Body mass index is 26.38 kg/(m^2). GEN: Well nourished, well developed, in no acute distress HEENT: normal Neck: no JVD, carotid bruits, or masses Cardiac: RRR. 1/6 systolic at RUSB murmur. Has rubs, or gallops,no  edema  Respiratory:  clear to auscultation bilaterally, normal work of breathing GI: soft, nontender, nondistended, + BS MS: no deformity or atrophy Skin: warm and dry, no rash Neuro:  Strength and sensation are intact Psych: euthymic mood, full affect   EKG:  EKG is ordered today. The ekg ordered today demonstrates normal sinus rhythm with left atrial abnormality  Recent Labs: 07/09/2015: ALT 25    Lipid Panel    Component Value Date/Time   CHOL 159 07/09/2015 0753   TRIG 74.0 07/09/2015 0753   HDL 55.30 07/09/2015 0753   CHOLHDL 3 07/09/2015 0753   VLDL 14.8 07/09/2015 0753   LDLCALC 88 07/09/2015 0753      Wt Readings from Last 3 Encounters:  07/13/15 81.058 kg (178 lb 11.2 oz)  06/19/14 80.341 kg  (177 lb 1.9 oz)  04/23/14 81.194 kg (179 lb)      Other studies Reviewed: Additional studies/ records that were reviewed today include: . Review of the above records demonstrates:    ASSESSMENT AND PLAN:  1. Coronary artery disease involving coronary bypass graft of native heart without angina pectoris: Burning chest discomfort. Probably not angina. We will do a myocardial perfusion study to exclude atypical angina.  2. Essential hypertension Elevated systolic pressure   3. Hyperlipidemia Stable and at target     Current medicines are reviewed at length with the patient today.  The patient has concerns regarding medicines.  The following changes have been made:  He is concerned about his prognosis. He is concerned that the chest discomfort ischemic.  Labs/ tests ordered today include:   Orders Placed This Encounter  Procedures  . Myocardial Perfusion Imaging  . EKG 12-Lead     Disposition:   FU with HS in 1 year  Signed, Sinclair Grooms, MD  07/13/2015 9:07 AM    [facility] [address], [place], [place]  [address] Phone: [phone]; Fax: [phone]

## 2015-07-13 ENCOUNTER — Ambulatory Visit (INDEPENDENT_AMBULATORY_CARE_PROVIDER_SITE_OTHER): Payer: Medicare Other | Admitting: Interventional Cardiology

## 2015-07-13 ENCOUNTER — Telehealth (HOSPITAL_COMMUNITY): Payer: Self-pay

## 2015-07-13 ENCOUNTER — Encounter: Payer: Self-pay | Admitting: Interventional Cardiology

## 2015-07-13 VITALS — BP 160/80 | HR 74 | Ht 69.0 in | Wt 178.7 lb

## 2015-07-13 DIAGNOSIS — R0789 Other chest pain: Secondary | ICD-10-CM | POA: Diagnosis not present

## 2015-07-13 DIAGNOSIS — E785 Hyperlipidemia, unspecified: Secondary | ICD-10-CM | POA: Diagnosis not present

## 2015-07-13 DIAGNOSIS — I10 Essential (primary) hypertension: Secondary | ICD-10-CM

## 2015-07-13 DIAGNOSIS — I25810 Atherosclerosis of coronary artery bypass graft(s) without angina pectoris: Secondary | ICD-10-CM | POA: Diagnosis not present

## 2015-07-13 NOTE — Telephone Encounter (Signed)
Patient given detailed instructions per Myocardial Perfusion Study Information Sheet for test on 07-15-2015 at 0700. Patient Notified to arrive 15 minutes early, and that it is imperative to arrive on time for appointment to keep from having the test rescheduled. Patient verbalized understanding. Oletta Lamas, Arin Peral A

## 2015-07-13 NOTE — Patient Instructions (Signed)
Medication Instructions:  Your physician recommends that you continue on your current medications as directed. Please refer to the Current Medication list given to you today.   Labwork: None   Testing/Procedures: Your physician has requested that you have a lexiscan myoview. For further information please visit [email or web address]. Please follow instruction sheet, as given.  Follow-Up: Your physician wants you to follow-up in: 1 year with Dr.Smith You will receive a reminder letter in the mail two months in advance. If you don't receive a letter, please call our office to schedule the follow-up appointment.   Any Other Special Instructions Will Be Listed Below (If Applicable).

## 2015-07-15 ENCOUNTER — Ambulatory Visit (HOSPITAL_COMMUNITY): Payer: Medicare Other | Attending: Cardiology

## 2015-07-15 DIAGNOSIS — I10 Essential (primary) hypertension: Secondary | ICD-10-CM | POA: Diagnosis not present

## 2015-07-15 DIAGNOSIS — I25810 Atherosclerosis of coronary artery bypass graft(s) without angina pectoris: Secondary | ICD-10-CM | POA: Diagnosis not present

## 2015-07-15 DIAGNOSIS — R0789 Other chest pain: Secondary | ICD-10-CM | POA: Diagnosis not present

## 2015-07-15 LAB — MYOCARDIAL PERFUSION IMAGING
CHL CUP NUCLEAR SDS: 0
CHL CUP NUCLEAR SRS: 2
LHR: 0.26
LV dias vol: 101 mL
LV sys vol: 37 mL
Peak HR: 80 {beats}/min
Rest HR: 61 {beats}/min
SSS: 2
TID: 0.96

## 2015-07-15 MED ORDER — TECHNETIUM TC 99M SESTAMIBI GENERIC - CARDIOLITE
31.3000 | Freq: Once | INTRAVENOUS | Status: AC | PRN
  Administered 2015-07-15: 31.3 via INTRAVENOUS

## 2015-07-15 MED ORDER — TECHNETIUM TC 99M SESTAMIBI GENERIC - CARDIOLITE
10.6000 | Freq: Once | INTRAVENOUS | Status: AC | PRN
  Administered 2015-07-15: 11 via INTRAVENOUS

## 2015-07-15 MED ORDER — REGADENOSON 0.4 MG/5ML IV SOLN
0.4000 mg | Freq: Once | INTRAVENOUS | Status: AC
  Administered 2015-07-15: 0.4 mg via INTRAVENOUS

## 2015-07-16 ENCOUNTER — Telehealth: Payer: Self-pay

## 2015-07-16 NOTE — Telephone Encounter (Signed)
-----   Message from Belva Crome, MD sent at 07/16/2015  7:29 AM EDT ----- Reassuring low risk study requiring no action and continued commitment to Risk Factor Modification.

## 2015-07-16 NOTE — Telephone Encounter (Signed)
Pt aware Dr.Ross has reviewed pt chart and complaints. She feel pt's symptoms are not cardiac or related to the [place] that was administered. Dr.Ross recommends pt f/u with his GI Dr.Martin, she feels pt symptoms are GI related. Pt will f/u with Dr.Martin Pt rqst a copy of his lexsican be fwd to Dr.Martin(done) and an update fwd to [place]

## 2015-07-16 NOTE — Telephone Encounter (Signed)
FU   Pt returning [place] phone call- prefer to use mobile #

## 2015-07-16 NOTE — Telephone Encounter (Signed)
Called to give pt myoview results.lmtcb 

## 2015-07-16 NOTE — Telephone Encounter (Signed)
Returned pt call. Pt aware of myoview results. Reassuring low risk study requiring no action and continued commitment to Risk Factor Modification.  Pt sts that since he had his lexiscan yesterday he has been having this pressure that starts at his sternum and goes down into his belly. Pt sts that the discomfort began the moment they administered the lexiscan. pts sts that he has been having the pain on and off since yesterday. He has had 8/10 bowel movements. Pt denies nausea, vomitting, fever, chills cough.   Adv pt that Dr.Smith is out the office. Adv pt I will talk another provider in the office and call back with their recommendations.

## 2015-07-30 ENCOUNTER — Other Ambulatory Visit: Payer: Self-pay | Admitting: Gastroenterology

## 2015-07-30 DIAGNOSIS — R1013 Epigastric pain: Secondary | ICD-10-CM | POA: Diagnosis not present

## 2015-07-31 DIAGNOSIS — L821 Other seborrheic keratosis: Secondary | ICD-10-CM | POA: Diagnosis not present

## 2015-07-31 DIAGNOSIS — Z85828 Personal history of other malignant neoplasm of skin: Secondary | ICD-10-CM | POA: Diagnosis not present

## 2015-07-31 DIAGNOSIS — L110 Acquired keratosis follicularis: Secondary | ICD-10-CM | POA: Diagnosis not present

## 2015-07-31 DIAGNOSIS — D2339 Other benign neoplasm of skin of other parts of face: Secondary | ICD-10-CM | POA: Diagnosis not present

## 2015-07-31 DIAGNOSIS — L820 Inflamed seborrheic keratosis: Secondary | ICD-10-CM | POA: Diagnosis not present

## 2015-08-03 ENCOUNTER — Ambulatory Visit
Admission: RE | Admit: 2015-08-03 | Discharge: 2015-08-03 | Disposition: A | Discharge status: Home / Self Care | Payer: Medicare Other | Source: Ambulatory Visit | Attending: Gastroenterology | Admitting: Gastroenterology

## 2015-08-03 DIAGNOSIS — K449 Diaphragmatic hernia without obstruction or gangrene: Secondary | ICD-10-CM | POA: Diagnosis not present

## 2015-08-03 DIAGNOSIS — R1013 Epigastric pain: Secondary | ICD-10-CM

## 2015-09-13 ENCOUNTER — Other Ambulatory Visit: Payer: Self-pay | Admitting: Interventional Cardiology

## 2015-09-14 ENCOUNTER — Ambulatory Visit (INDEPENDENT_AMBULATORY_CARE_PROVIDER_SITE_OTHER): Payer: Medicare Other | Admitting: Podiatry

## 2015-09-14 ENCOUNTER — Encounter: Payer: Self-pay | Admitting: Podiatry

## 2015-09-14 VITALS — BP 168/82 | HR 66 | Resp 16 | Ht 69.0 in | Wt 179.0 lb

## 2015-09-14 DIAGNOSIS — L84 Corns and callosities: Secondary | ICD-10-CM | POA: Diagnosis not present

## 2015-09-14 DIAGNOSIS — M79676 Pain in unspecified toe(s): Secondary | ICD-10-CM

## 2015-09-14 DIAGNOSIS — M2040 Other hammer toe(s) (acquired), unspecified foot: Secondary | ICD-10-CM | POA: Diagnosis not present

## 2015-09-14 DIAGNOSIS — B351 Tinea unguium: Secondary | ICD-10-CM | POA: Diagnosis not present

## 2015-09-14 DIAGNOSIS — I25810 Atherosclerosis of coronary artery bypass graft(s) without angina pectoris: Secondary | ICD-10-CM

## 2015-09-14 MED ORDER — UREA 45 % EX CREA: 1.0000 "application " | TOPICAL_CREAM | Freq: Every day | CUTANEOUS | Status: DC

## 2015-09-14 NOTE — Progress Notes (Signed)
   Subjective:    Patient ID: Nathan [name], male    DOB: 1946/08/28, 69 y.o.   MRN: [id number]  HPI  69 year old male presents the office with concerns of toenail discoloration and thickening to the right hallux as well as bilateral second toes. He is pretty tried multiple medications to help the thickness of his nails without any relief. He does state that the second toes become painful on the nails or triggering shoe gear. He also has a callus the distal aspect of bilateral second toes. Denies any redness or drainage from the nails or calluses sites. No other areas of tenderness. No other complaints at this time.  Review of Systems  Skin: Positive for color change.  All other systems reviewed and are negative.      Objective:   Physical Exam AAO x3, NAD DP/PT pulses palpable bilaterally, CRT less than 3 seconds Protective sensation intact with Simms Weinstein monofilament, vibratory sensation intact, Achilles tendon reflex intact Bilateral second digit toenails and the right hallux nails hypertrophic, dystrophic, brittle, discolored, elongated particular left second toenail. There is tenderness to patient Nails 3. There is no swelling erythema or drainage. Remaining nails without pathology. There is hammertoe contractures to lesser digits particularly the second digit bilaterally. The distal aspect second toes bilaterally there is a hyperkeratotic lesion. Upon debridement no underlying ulceration, drainage or other signs of infection. No areas of tenderness to bilateral lower extremities. MMT 5/5, ROM WNL.  No open lesions or pre-ulcerative lesions.  No overlying edema, erythema, increase in warmth to bilateral lower extremities.  No pain with calf compression, swelling, warmth, erythema bilaterally.      Assessment & Plan:  69 year old male with onychodystrophy, likely onychomycosis, hyperkeratotic lesions -Treatment options discussed including all alternatives, risks, and  complications -Nail sharply debrided 3 without complication/bleeding. -Prescribed urea 40% cream for the nails. Discussed how to apply the medication as well as side effects. -Should he continue to have pain in the future recommend toenail removal. He will hold off on that for now.  -Follow-up as needed. Call any questions or concerns in the meantime.  Celesta Gentile, DPM -

## 2015-09-15 ENCOUNTER — Encounter: Payer: Self-pay | Admitting: Podiatry

## 2015-09-16 ENCOUNTER — Encounter: Payer: Self-pay | Admitting: Podiatry

## 2015-11-16 DIAGNOSIS — I788 Other diseases of capillaries: Secondary | ICD-10-CM | POA: Diagnosis not present

## 2015-11-16 DIAGNOSIS — L821 Other seborrheic keratosis: Secondary | ICD-10-CM | POA: Diagnosis not present

## 2015-11-16 DIAGNOSIS — L720 Epidermal cyst: Secondary | ICD-10-CM | POA: Diagnosis not present

## 2015-11-16 DIAGNOSIS — D1801 Hemangioma of skin and subcutaneous tissue: Secondary | ICD-10-CM | POA: Diagnosis not present

## 2015-11-16 DIAGNOSIS — L570 Actinic keratosis: Secondary | ICD-10-CM | POA: Diagnosis not present

## 2015-11-16 DIAGNOSIS — L300 Nummular dermatitis: Secondary | ICD-10-CM | POA: Diagnosis not present

## 2015-11-16 DIAGNOSIS — L814 Other melanin hyperpigmentation: Secondary | ICD-10-CM | POA: Diagnosis not present

## 2015-11-16 DIAGNOSIS — Z85828 Personal history of other malignant neoplasm of skin: Secondary | ICD-10-CM | POA: Diagnosis not present

## 2015-12-23 ENCOUNTER — Other Ambulatory Visit: Payer: Self-pay | Admitting: Gastroenterology

## 2015-12-23 DIAGNOSIS — R6881 Early satiety: Secondary | ICD-10-CM | POA: Diagnosis not present

## 2015-12-23 DIAGNOSIS — Z86010 Personal history of colonic polyps: Secondary | ICD-10-CM | POA: Diagnosis not present

## 2015-12-23 DIAGNOSIS — K5900 Constipation, unspecified: Secondary | ICD-10-CM | POA: Diagnosis not present

## 2015-12-23 DIAGNOSIS — Z0000 Encounter for general adult medical examination without abnormal findings: Secondary | ICD-10-CM | POA: Diagnosis not present

## 2015-12-23 DIAGNOSIS — Z125 Encounter for screening for malignant neoplasm of prostate: Secondary | ICD-10-CM | POA: Diagnosis not present

## 2015-12-23 DIAGNOSIS — I2510 Atherosclerotic heart disease of native coronary artery without angina pectoris: Secondary | ICD-10-CM | POA: Diagnosis not present

## 2015-12-23 DIAGNOSIS — Z0001 Encounter for general adult medical examination with abnormal findings: Secondary | ICD-10-CM | POA: Diagnosis not present

## 2015-12-23 DIAGNOSIS — R202 Paresthesia of skin: Secondary | ICD-10-CM | POA: Diagnosis not present

## 2015-12-23 DIAGNOSIS — I10 Essential (primary) hypertension: Secondary | ICD-10-CM | POA: Diagnosis not present

## 2015-12-23 DIAGNOSIS — E7800 Pure hypercholesterolemia, unspecified: Secondary | ICD-10-CM | POA: Diagnosis not present

## 2015-12-26 DIAGNOSIS — R21 Rash and other nonspecific skin eruption: Secondary | ICD-10-CM | POA: Diagnosis not present

## 2015-12-28 DIAGNOSIS — L309 Dermatitis, unspecified: Secondary | ICD-10-CM | POA: Diagnosis not present

## 2015-12-28 DIAGNOSIS — Z85828 Personal history of other malignant neoplasm of skin: Secondary | ICD-10-CM | POA: Diagnosis not present

## 2016-01-04 ENCOUNTER — Ambulatory Visit (INDEPENDENT_AMBULATORY_CARE_PROVIDER_SITE_OTHER): Payer: Medicare Other | Admitting: Neurology

## 2016-01-04 ENCOUNTER — Encounter: Payer: Self-pay | Admitting: Neurology

## 2016-01-04 VITALS — BP 173/84 | HR 82 | Ht 68.0 in | Wt 177.0 lb

## 2016-01-04 DIAGNOSIS — M79609 Pain in unspecified limb: Secondary | ICD-10-CM | POA: Diagnosis not present

## 2016-01-04 DIAGNOSIS — R202 Paresthesia of skin: Secondary | ICD-10-CM | POA: Diagnosis not present

## 2016-01-04 NOTE — Patient Instructions (Signed)
Paresthesia Paresthesia is an abnormal burning or prickling sensation. This sensation is generally felt in the hands, arms, legs, or feet. However, it may occur in any part of the body. Usually, it is not painful. The feeling may be described as:  Tingling or numbness.  Pins and needles.  Skin crawling.  Buzzing.  Limbs falling asleep.  Itching. Most people experience temporary (transient) paresthesia at some time in their lives. Paresthesia may occur when you breathe too quickly (hyperventilation). It can also occur without any apparent cause. Commonly, paresthesia occurs when pressure is placed on a nerve. The sensation quickly goes away after the pressure is removed. For some people, however, paresthesia is a long-lasting (chronic) condition that is caused by an underlying disorder. If you continue to have paresthesia, you may need further medical evaluation. HOME CARE INSTRUCTIONS Watch your condition for any changes. Taking the following actions may help to lessen any discomfort that you are feeling:  Avoid drinking alcohol.  Try acupuncture or massage to help relieve your symptoms.  Keep all follow-up visits as directed by your health care provider. This is important. SEEK MEDICAL CARE IF:  You continue to have episodes of paresthesia.  Your burning or prickling feeling gets worse when you walk.  You have pain, cramps, or dizziness.  You develop a rash. SEEK IMMEDIATE MEDICAL CARE IF:  You feel weak.  You have trouble walking or moving.  You have problems with speech, understanding, or vision.  You feel confused.  You cannot control your bladder or bowel movements.  You have numbness after an injury.  You faint.   This information is not intended to replace advice given to you by your health care provider. Make sure you discuss any questions you have with your health care provider.   Document Released: 11/18/2002 Document Revised: 04/14/2015 Document Reviewed:  11/24/2014 Elsevier Interactive Patient Education 2016 [employer]  

## 2016-01-04 NOTE — Progress Notes (Signed)
Reason for visit: Right arm paresthesias  Referring physician: Dr. Earle Gell  Nathan [name] is a 70 y.o. male  History of present illness:  Nathan [name] is a 70 year old left-handed white male with a history of cervical spine surgery and prior right shoulder surgery in the past. The patient began having some symptoms involving the right arm on 12/13/2015. He indicated that he awakened with paresthesias and discomfort going down the right arm from the shoulder to the hand. The discomfort is in the right shoulder primarily. The patient indicates that if he gets up and lets the arm hang down, the symptoms will be alleviated. He has noted that if he elevates the left arm with a pillow at night while sleeping on his back it helps, if he rolls to the left side, he must keep his right hand in his pocket. The patient generally does not note any symptoms during the day with exception that he may have some similar symptoms while walking on the treadmill for long. When this occurs, he may have to let his right hand drop down. Also, with long distance driving, he will have similar symptoms. The patient denies any weakness of the arms, he denies a neck or head discomfort. The patient denies any symptoms on the left arm or on the legs. He denies any balance issues, or difficulty controlling the bowels or the bladder. He denies any headache or symptoms on the head and neck. He is sent to this office for an evaluation.  Past Medical History  Diagnosis Date  . Arthritis   . Blood transfusion without reported diagnosis   . Wears glasses   . Coronary artery disease   . GERD (gastroesophageal reflux disease)   . Seasonal allergies   . Hypertension   . Hyperlipemia     Past Surgical History  Procedure Laterality Date  . Knee arthrocentesis  1980    right  . Shoulder acromioplasty  1985    rt  . Sinus endo w/fusion    . Tonsillectomy and adenoidectomy    . Exploration post operative open heart    .  Appendectomy      age 58  . Colonoscopy    . Eye surgery      detached retina-lt  . Inguinal hernia repair Left 04/01/2014    Procedure: HERNIA REPAIR INGUINAL ADULT;  Surgeon: Shann Medal, MD;  Location: [facility];  Service: General;  Laterality: Left;  . Insertion of mesh Left 04/01/2014    Procedure: INSERTION OF MESH;  Surgeon: Shann Medal, MD;  Location: [facility];  Service: General;  Laterality: Left;    Family History  Problem Relation Age of Onset  . Heart disease Mother   . Cancer Father   . Alcohol abuse Brother   . Kidney failure Brother     Social history:  reports that he quit smoking about 46 years ago. He has never used smokeless tobacco. He reports that he drinks alcohol. He reports that he does not use illicit drugs.  Medications:  Prior to Admission medications   Medication Sig Start Date End Date Taking? Authorizing Provider  atorvastatin (LIPITOR) 10 MG tablet TAKE 1 TABLET (10 MG TOTAL) BY MOUTH DAILY. 09/14/15  Yes Belva Crome, MD  cetirizine (ZYRTEC) 10 MG tablet Take 10 mg by mouth daily.   Yes Historical Provider, MD  clobetasol cream (TEMOVATE) AB-123456789 % Apply 1 application topically 2 (two) times daily.   Yes  Historical Provider, MD  cycloSPORINE (RESTASIS) 0.05 % ophthalmic emulsion 1 drop 2 (two) times daily.   Yes Historical Provider, MD  flurazepam (DALMANE) 30 MG capsule Take 30 mg by mouth at bedtime as needed for sleep.   Yes Historical Provider, MD  hydrOXYzine (ATARAX/VISTARIL) 25 MG tablet Take 25 mg by mouth daily as needed.   Yes Historical Provider, MD  omeprazole (PRILOSEC) 20 MG capsule Take 20 mg by mouth daily.   Yes Historical Provider, MD  TGT ASPIRIN LOW DOSE 81 MG EC tablet Take 81 mg by mouth daily.  12/31/13  Yes Historical Provider, MD  triamcinolone (NASACORT) 55 MCG/ACT AERO nasal inhaler Place 2 sprays into the nose daily.   Yes Historical Provider, MD  triamcinolone cream (KENALOG) 0.1 % APPLY TO  AFFECTED AREA ON THE SKIN TWICE DAILY AS NEEDED, AS DIRECTED 11/16/15  Yes Historical Provider, MD  Urea 45 % CREA Apply 1 application topically daily. 09/14/15  Yes Trula Slade, DPM      Allergies  Allergen Reactions  . Bystolic [Nebivolol Hcl] Rash  . Neosporin [Neomycin-Bacitracin Zn-Polymyx]   . Pravastatin   . Toprol Xl [Metoprolol Tartrate]     ROS:  Out of a complete 14 system review of symptoms, the patient complains only of the following symptoms, and all other reviewed systems are negative.  Rash, itching Allergies Numbness  Blood pressure 173/84, pulse 82, height 5\' 8"  (1.727 m), weight 177 lb (80.287 kg).  Physical Exam  General: The patient is alert and cooperative at the time of the examination.  Eyes: Pupils are equal, round, and reactive to light. Discs are flat bilaterally.  Neck: The neck is supple, no carotid bruits are noted.  Respiratory: The respiratory examination is clear.  Cardiovascular: The cardiovascular examination reveals a regular rate and rhythm, no obvious murmurs or rubs are noted.  Neuromuscular: The patient lacks about 30 of full lateral rotation bilaterally of the cervical spine. Radial pulses are full and symmetric, there is no alteration in the radial pulses on the right with arm elevation, or head turning to the left or to the right.  Skin: Extremities are without significant edema.  Neurologic Exam  Mental status: The patient is alert and oriented x 3 at the time of the examination. The patient has apparent normal recent and remote memory, with an apparently normal attention span and concentration ability.  Cranial nerves: Facial symmetry is present. There is good sensation of the face to pinprick and soft touch bilaterally. The strength of the facial muscles and the muscles to head turning and shoulder shrug are normal bilaterally. Speech is well enunciated, no aphasia or dysarthria is noted. Extraocular movements are full.  Visual fields are full. The tongue is midline, and the patient has symmetric elevation of the soft palate. No obvious hearing deficits are noted.  Motor: The motor testing reveals 5 over 5 strength of all 4 extremities. Good symmetric motor tone is noted throughout.  Sensory: Sensory testing is intact to pinprick, soft touch, vibration sensation, and position sense on all 4 extremities. No evidence of extinction is noted.  Coordination: Cerebellar testing reveals good finger-nose-finger and heel-to-shin bilaterally. Tinel's sign is positive at the wrist on the right, not on the left.  Gait and station: Gait is normal. Tandem gait is normal. Romberg is negative. No drift is seen.  Reflexes: Deep tendon reflexes are symmetric and normal bilaterally. Toes are downgoing bilaterally.   Assessment/Plan:  1. Right arm paresthesias, discomfort  Clinical examination  is unremarkable today. The patient does have a Tinel sign at the wrist on the right, the symptoms of the right arm discomfort and paresthesias tend to occur at night while sleeping, while driving long distances, and while on the treadmill. We will need to evaluate the patient for possible neuropathy such as carpal tunnel syndrome, or a cervical radiculopathy. The patient is not having neck discomfort however. He will follow-up with EMG evaluation.  Jill Alexanders MD 01/04/2016 8:58 AM  [facility] [address] [place], [place] [address]  Phone [phone] Fax [phone]

## 2016-01-13 ENCOUNTER — Telehealth: Payer: Self-pay | Admitting: Interventional Cardiology

## 2016-01-13 NOTE — Telephone Encounter (Signed)
Returned pt call. Pt sts that he has had to stop his Atorvastatin. Pt sts that he broke out with a pretty bad rash a couple of weeks ago, and had to get a cortisol shot. He stopped Atorvastatin and the rash cleared up. He wnted to make sure it was the medication so he resumed Atorvastatin and the rash returned. He stopped the med 2 wks ago and the rash went away, he had also been having GI issues and they have resolved since stopping Atorvastatin. Pt sts that he was on Pravastatin in the past and had the same reaction.  Pt is also concerned about his bp. His bp at home and at his pcp office his systolic has not been below 150. Pt is asymptomatic, he admits to being anxious whenever he has to ck his bp, even when he is checking it at home.  Adv pt that I will fwd Dr.Smith an update and call back with his recommendation. Pt voiced appreciation and verbalized understanding.

## 2016-01-13 NOTE — Telephone Encounter (Signed)
New Message:  Pt called in stating that he was having a reaction to his Atorvastatin medication and as a result his Systolic levels are resting at the high 150's to low 160's. Please assist  Thanks

## 2016-01-14 NOTE — Telephone Encounter (Signed)
Pt aware of Dr.Smith's recommendation below. Lipid clinic referral. Monitor BP and also check when comes to lipid clinic. Adv pt that a scheduler form our office will call him to schedule an appt with the lipid and bp clinic. Adv pt to measure his bp daily, and bring his readings with him to his appt with the pharmacist. Pt agreeable with plan and verbalized understanding. Pt routed to pcc to call pt to schedule

## 2016-01-14 NOTE — Telephone Encounter (Signed)
Lipid clinic referral. Monitor BP and also check when comes to lipid clinic.

## 2016-01-25 ENCOUNTER — Ambulatory Visit (INDEPENDENT_AMBULATORY_CARE_PROVIDER_SITE_OTHER): Payer: Medicare Other | Admitting: Pharmacist

## 2016-01-25 VITALS — BP 146/76 | HR 75

## 2016-01-25 DIAGNOSIS — I10 Essential (primary) hypertension: Secondary | ICD-10-CM

## 2016-01-25 DIAGNOSIS — E785 Hyperlipidemia, unspecified: Secondary | ICD-10-CM | POA: Diagnosis not present

## 2016-01-25 MED ORDER — EZETIMIBE 10 MG PO TABS: 10.0000 mg | ORAL_TABLET | Freq: Every day | ORAL | Status: DC

## 2016-01-25 NOTE — Patient Instructions (Signed)
Pick up your prescription for Zetia 10mg  once daily We will recheck your cholesterol in 3 months Call Kyree Fedorko in lipid clinic with any questions or problems tolerating Zetia Continue to monitor your blood pressure - call if the top number is consistently above 150

## 2016-01-25 NOTE — Progress Notes (Signed)
Patient ID: Nathan [name]                 DOB: [birth year]                          MRN: [id number]     HPI: Nathan [name] is a 70 y.o. male referred by Dr. Tamala Julian to HTN clinic. Patient has a PMH significant for CAD, prior CABG, hypertension, hyperlipidemia. Patient has a history of rash with pravastatin and atorvastatin - each time the rash presented the same way and was severe enough on his torso and legs that he needed steroids to mediate the rash. Steroids helped, but drug d/c was needed in both cases to get rid of the rash completely. He also experienced vivid dreams and a bad taste in his mouth with rosuvastatin. He does not recall ever trying Zetia. He stopped taking his atorvastatin on 2/1 and is currently not taking any medications for lipids.  Patient states he has tried metoprolol and nebivolol for blood pressure in the past but experienced a rash with both medications. He is currently not taking anything for blood pressure control. Patient reports having white coat syndrome and also has anxiety when checking his blood pressure at home himself. He reports higher readings when first starting to check blood pressure at home but those readings have decreased over the past week - likely related to anxiety over drug-related rash. His highest systolic reading at home was 160 on 2/4 and his lowest was 135 on 2/12. Reports most readings in the 140s/80s.   BP in clinic today: 146/76 HR: 75  Current HTN meds: none Previously tried: metoprolol and nebivolol (rash) BP goal: <150/90 mmHg  Current Lipid Meds: none Intolerances: atorvastatin and pravastatin (rash), rosuvastatin (bad taste and vivid dreams)  Risk Factors: CAD, s/p CABG Goals: LDL <70  Family History: No family history of heart disease or stroke.  Social History: The patient  reports that he quit smoking about 46 years ago. He does not have any smokeless tobacco history on file. He reports that he drinks alcohol. He reports that he  does not use illicit drugs.   Home BP readings: lowest 135, highest 160 on 2/4, have since decreased into the 140s/80s.  Labs:  06/2015: TC 159, HDL 55.3, LDL 88, TG 74 (on atorvastatin 10mg )  Wt Readings from Last 3 Encounters:  01/04/16 177 lb (80.287 kg)  09/14/15 179 lb (81.194 kg)  07/15/15 178 lb (80.74 kg)   BP Readings from Last 3 Encounters:  01/04/16 173/84  09/14/15 168/82  07/13/15 160/80   Pulse Readings from Last 3 Encounters:  01/04/16 82  09/14/15 66  07/13/15 74    Renal function: CrCl cannot be calculated (Unknown ideal weight.).  Past Medical History  Diagnosis Date  . Arthritis   . Blood transfusion without reported diagnosis   . Wears glasses   . Coronary artery disease   . GERD (gastroesophageal reflux disease)   . Seasonal allergies   . Hypertension   . Hyperlipemia     Current Outpatient Prescriptions on File Prior to Visit  Medication Sig Dispense Refill  . atorvastatin (LIPITOR) 10 MG tablet TAKE 1 TABLET (10 MG TOTAL) BY MOUTH DAILY. 90 tablet 2  . cetirizine (ZYRTEC) 10 MG tablet Take 10 mg by mouth daily.    . clobetasol cream (TEMOVATE) [id number] % Apply 1 application topically 2 (two) times daily.    . cycloSPORINE (RESTASIS) 0.05 %  ophthalmic emulsion 1 drop 2 (two) times daily.    . flurazepam (DALMANE) 30 MG capsule Take 30 mg by mouth at bedtime as needed for sleep.    . hydrOXYzine (ATARAX/VISTARIL) 25 MG tablet Take 25 mg by mouth daily as needed.    Marland Kitchen omeprazole (PRILOSEC) 20 MG capsule Take 20 mg by mouth daily.    . TGT ASPIRIN LOW DOSE 81 MG EC tablet Take 81 mg by mouth daily.     Marland Kitchen triamcinolone (NASACORT) 55 MCG/ACT AERO nasal inhaler Place 2 sprays into the nose daily.    Marland Kitchen triamcinolone cream (KENALOG) 0.1 % APPLY TO AFFECTED AREA ON THE SKIN TWICE DAILY AS NEEDED, AS DIRECTED  0  . Urea 45 % CREA Apply 1 application topically daily. 225 g 2   No current facility-administered medications on file prior to visit.     Allergies  Allergen Reactions  . Bystolic [Nebivolol Hcl] Rash  . Neosporin [Neomycin-Bacitracin Zn-Polymyx]   . Pravastatin   . Toprol Xl [Metoprolol Tartrate]      Assessment/Plan:  1. Hyperlipidemia - Patients last LDL of 88 was slightly above goal of <70 on atorvastatin 10 mg. The patient has since stopped the atorvastatin due to rash and is currently not taking any medications for lipids. Patient also has a history of rash with pravastatin and vivid dreams with rosuvastatin. Patient is hesitant to start any new medications because he says he is very sensitive to medications. However, he is willing to try Zetia 10 mg daily. Patient will call clinic if he notices any signs of rash. Will follow up with lipid panel in 3 months.    2. Hypertension - patient is at goal of <150/90 in clinic today and home blood pressure readings are mostly at goal in the 140s/80s. No BP medications added today Pt will continue to monitor blood pressure at home and will call the clinic if readings are consistently above 150/90.     Minnetta Sandora E. Zacari Radick, PharmD, [place] [id number] [place], [place] [address] Phone: [phone]; Fax: [phone] 01/25/2016 3:00 PM

## 2016-01-27 ENCOUNTER — Telehealth: Payer: Self-pay | Admitting: Neurology

## 2016-01-27 DIAGNOSIS — M79601 Pain in right arm: Secondary | ICD-10-CM

## 2016-01-27 NOTE — Telephone Encounter (Signed)
Patient called to advise the problem he was having has stopped as abruptly as it started, wonders if he needs to keep his NCS/EMG appointment on 02/03/16 or should he cancel appointment? Please advise.

## 2016-01-27 NOTE — Telephone Encounter (Signed)
I called the patient. He has gone about 10-12 days without having any numbness. He can now sleep in any position without his arm going numb. He would like Dr. Jannifer Franklin' advice on whether he should go through with the EMG/NCV.

## 2016-01-27 NOTE — Telephone Encounter (Signed)
I called the patient. The patient has had resolution of his symptoms in the arm. At this point, we will cancel the appointment for the EMG study. The patient will call me if the symptoms recur again, we can always get the study at a later date.   I have canceled the appointments.

## 2016-02-03 ENCOUNTER — Encounter: Payer: Medicare Other | Admitting: Neurology

## 2016-02-04 ENCOUNTER — Other Ambulatory Visit: Payer: Self-pay | Admitting: Pharmacist

## 2016-02-04 ENCOUNTER — Telehealth: Payer: Self-pay | Admitting: Pharmacist

## 2016-02-04 MED ORDER — EZETIMIBE 10 MG PO TABS: 10.0000 mg | ORAL_TABLET | Freq: Every day | ORAL | Status: DC

## 2016-02-04 NOTE — Telephone Encounter (Signed)
Pt called to report that he has a sour taste in his mouth for a few hours after he takes his Zetia and that eating food does not help. He currently takes Zetia when he wakes up. Advised him to start taking it before he goes to bed so that the unpleasant taste will be gone by the time he wakes up. He also requested a 90 day supply from [facility] mail order due to cheaper price. New rx sent.

## 2016-02-05 ENCOUNTER — Encounter (HOSPITAL_COMMUNITY): Payer: Self-pay | Admitting: *Deleted

## 2016-02-10 ENCOUNTER — Other Ambulatory Visit: Payer: Self-pay | Admitting: Gastroenterology

## 2016-02-15 ENCOUNTER — Ambulatory Visit (HOSPITAL_COMMUNITY): Payer: Medicare Other | Admitting: Anesthesiology

## 2016-02-15 ENCOUNTER — Ambulatory Visit (HOSPITAL_COMMUNITY)
Admission: RE | Admit: 2016-02-15 | Discharge: 2016-02-15 | Disposition: A | Discharge status: Home / Self Care | Payer: Medicare Other | Source: Ambulatory Visit | Attending: Gastroenterology | Admitting: Gastroenterology

## 2016-02-15 ENCOUNTER — Encounter (HOSPITAL_COMMUNITY)
Admission: RE | Disposition: A | Discharge status: Home / Self Care | Payer: Self-pay | Source: Ambulatory Visit | Attending: Gastroenterology

## 2016-02-15 ENCOUNTER — Encounter (HOSPITAL_COMMUNITY): Payer: Self-pay | Admitting: Anesthesiology

## 2016-02-15 DIAGNOSIS — K219 Gastro-esophageal reflux disease without esophagitis: Secondary | ICD-10-CM | POA: Insufficient documentation

## 2016-02-15 DIAGNOSIS — I10 Essential (primary) hypertension: Secondary | ICD-10-CM | POA: Diagnosis not present

## 2016-02-15 DIAGNOSIS — M4806 Spinal stenosis, lumbar region: Secondary | ICD-10-CM | POA: Insufficient documentation

## 2016-02-15 DIAGNOSIS — Z87891 Personal history of nicotine dependence: Secondary | ICD-10-CM | POA: Insufficient documentation

## 2016-02-15 DIAGNOSIS — Z1211 Encounter for screening for malignant neoplasm of colon: Secondary | ICD-10-CM | POA: Diagnosis not present

## 2016-02-15 DIAGNOSIS — M1990 Unspecified osteoarthritis, unspecified site: Secondary | ICD-10-CM | POA: Insufficient documentation

## 2016-02-15 DIAGNOSIS — K5730 Diverticulosis of large intestine without perforation or abscess without bleeding: Secondary | ICD-10-CM | POA: Diagnosis not present

## 2016-02-15 DIAGNOSIS — Z951 Presence of aortocoronary bypass graft: Secondary | ICD-10-CM | POA: Insufficient documentation

## 2016-02-15 DIAGNOSIS — Z86010 Personal history of colonic polyps: Secondary | ICD-10-CM | POA: Diagnosis not present

## 2016-02-15 DIAGNOSIS — Z85828 Personal history of other malignant neoplasm of skin: Secondary | ICD-10-CM | POA: Insufficient documentation

## 2016-02-15 DIAGNOSIS — E7800 Pure hypercholesterolemia, unspecified: Secondary | ICD-10-CM | POA: Insufficient documentation

## 2016-02-15 DIAGNOSIS — I2510 Atherosclerotic heart disease of native coronary artery without angina pectoris: Secondary | ICD-10-CM | POA: Diagnosis not present

## 2016-02-15 HISTORY — DX: Adverse effect of unspecified anesthetic, initial encounter: T41.45XA

## 2016-02-15 HISTORY — PX: COLONOSCOPY WITH PROPOFOL: SHX5780

## 2016-02-15 HISTORY — DX: Other complications of anesthesia, initial encounter: T88.59XA

## 2016-02-15 HISTORY — PX: ESOPHAGOGASTRODUODENOSCOPY (EGD) WITH PROPOFOL: SHX5813

## 2016-02-15 SURGERY — COLONOSCOPY WITH PROPOFOL
Anesthesia: Monitor Anesthesia Care

## 2016-02-15 MED ORDER — PROPOFOL 10 MG/ML IV BOLUS
INTRAVENOUS | Status: AC
  Filled 2016-02-15: qty 40

## 2016-02-15 MED ORDER — PROPOFOL 500 MG/50ML IV EMUL
INTRAVENOUS | Status: DC | PRN
  Administered 2016-02-15 (×2): 50 mg via INTRAVENOUS

## 2016-02-15 MED ORDER — LACTATED RINGERS IV SOLN
INTRAVENOUS | Status: DC
  Administered 2016-02-15: 08:00:00 via INTRAVENOUS
  Administered 2016-02-15: 1000 mL via INTRAVENOUS

## 2016-02-15 MED ORDER — PROPOFOL 500 MG/50ML IV EMUL
INTRAVENOUS | Status: DC | PRN
  Administered 2016-02-15: 75 ug/kg/min via INTRAVENOUS

## 2016-02-15 MED ORDER — SODIUM CHLORIDE 0.9 % IV SOLN
INTRAVENOUS | Status: DC
Start: 2016-02-15 — End: 2016-02-15

## 2016-02-15 MED ORDER — ONDANSETRON HCL 4 MG/2ML IJ SOLN
INTRAMUSCULAR | Status: DC | PRN
  Administered 2016-02-15: 4 mg via INTRAVENOUS

## 2016-02-15 SURGICAL SUPPLY — 25 items

## 2016-02-15 NOTE — H&P (Signed)
  Procedure: Surveillance colonoscopy. February 2012 surveillance colonoscopy performed with removal of a 4 mm tubular adenomatous descending colon polyp  History: The patient is a 70 year old male born [birth year]. He is scheduled to undergo a surveillance colonoscopy today.  Past medical history: Coronary artery disease. 07/15/2015 normal myocardial perfusion study. Left ventricular ejection fraction 63%. Coronary artery bypass grafting performed in 2005. Hypercholesterolemia. Gastroesophageal reflux. 08/03/2015 normal barium upper GI x-ray series performed. Basal cell skin cancers removed. Lumbar spinal stenosis. Kidney stones. Allergic rhinitis. Anterior cervical discectomy. Right shoulder surgery for fractured collarbone and dislocated shoulder. Left inguinal hernia repair. Tonsillectomy. Sinus surgery. Left knee surgery. Right eye surgery to treat posterior vitreous detachment. Appendectomy.  Medication allergies: Toprol caused rash. Neosporin caused rash. Statin drugs cause rash. Bistolic caused rash.  Exam: The patient is alert and lying comfortably on the endoscopy stretcher. Abdomen is soft and nontender to palpation. Lungs are clear to auscultation. Cardiac exam reveals a regular rhythm.  Plan: Proceed with surveillance colonoscopy

## 2016-02-15 NOTE — Anesthesia Preprocedure Evaluation (Addendum)
Anesthesia Evaluation  Patient identified by MRN, date of birth, ID band Patient awake    Reviewed: Allergy & Precautions, NPO status , Patient's Chart, lab work & pertinent test results  Airway Mallampati: III  TM Distance: >3 FB Neck ROM: Full  Mouth opening: Limited Mouth Opening  Dental   Pulmonary former smoker,    breath sounds clear to auscultation       Cardiovascular hypertension, Pt. on medications + CAD and + CABG   Rhythm:Regular Rate:Normal     Neuro/Psych negative neurological ROS     GI/Hepatic Neg liver ROS, GERD  ,  Endo/Other  negative endocrine ROS  Renal/GU negative Renal ROS     Musculoskeletal  (+) Arthritis ,   Abdominal   Peds  Hematology negative hematology ROS (+)   Anesthesia Other Findings   Reproductive/Obstetrics                            Anesthesia Physical Anesthesia Plan  ASA: III  Anesthesia Plan: MAC   Post-op Pain Management:    Induction: Intravenous  Airway Management Planned: Natural Airway and Nasal Cannula  Additional Equipment:   Intra-op Plan:   Post-operative Plan:   Informed Consent: I have reviewed the patients History and Physical, chart, labs and discussed the procedure including the risks, benefits and alternatives for the proposed anesthesia with the patient or authorized representative who has indicated his/her understanding and acceptance.     Plan Discussed with: CRNA  Anesthesia Plan Comments:         Anesthesia Quick Evaluation

## 2016-02-15 NOTE — Transfer of Care (Signed)
Immediate Anesthesia Transfer of Care Note  Patient: Nathan [name]  Procedure(s) Performed: Procedure(s): COLONOSCOPY WITH PROPOFOL (N/A) ESOPHAGOGASTRODUODENOSCOPY (EGD) WITH PROPOFOL (N/A)  Patient Location: PACU  Anesthesia Type:MAC  Level of Consciousness:  sedated, patient cooperative and responds to stimulation  Airway & Oxygen Therapy:Patient Spontanous Breathing and Patient connected to face mask oxgen  Post-op Assessment:  Report given to PACU RN and Post -op Vital signs reviewed and stable  Post vital signs:  Reviewed and stable  Last Vitals:  Filed Vitals:   02/15/16 0730  BP: 164/71  Pulse: 77  Temp: 36.7 C  Resp: 21    Complications: No apparent anesthesia complications

## 2016-02-15 NOTE — Anesthesia Postprocedure Evaluation (Signed)
Anesthesia Post Note  Patient: Nathan [name]  Procedure(s) Performed: Procedure(s) (LRB): COLONOSCOPY WITH PROPOFOL (N/A) ESOPHAGOGASTRODUODENOSCOPY (EGD) WITH PROPOFOL (N/A)  Patient location during evaluation: PACU Anesthesia Type: MAC Level of consciousness: awake and alert Pain management: pain level controlled Vital Signs Assessment: post-procedure vital signs reviewed and stable Respiratory status: spontaneous breathing, nonlabored ventilation, respiratory function stable and patient connected to nasal cannula oxygen Cardiovascular status: stable and blood pressure returned to baseline Anesthetic complications: no    Last Vitals:  Filed Vitals:   02/15/16 0730 02/15/16 0851  BP: 164/71 127/65  Pulse: 77 79  Temp: 36.7 C 36.4 C  Resp: 21 16    Last Pain: There were no vitals filed for this visit.               Tiajuana Amass

## 2016-02-15 NOTE — Discharge Instructions (Signed)
Colonoscopy, Care After °Refer to this sheet in the next few weeks. These instructions provide you with information on caring for yourself after your procedure. Your health care provider may also give you more specific instructions. Your treatment has been planned according to current medical practices, but problems sometimes occur. Call your health care provider if you have any problems or questions after your procedure. °WHAT TO EXPECT AFTER THE PROCEDURE  °After your procedure, it is typical to have the following: °· A small amount of blood in your stool. °· Moderate amounts of gas and mild abdominal cramping or bloating. °HOME CARE INSTRUCTIONS °· Do not drive, operate machinery, or sign important documents for 24 hours. °· You may shower and resume your regular physical activities, but move at a slower pace for the first 24 hours. °· Take frequent rest periods for the first 24 hours. °· Walk around or put a warm pack on your abdomen to help reduce abdominal cramping and bloating. °· Drink enough fluids to keep your urine clear or pale yellow. °· You may resume your normal diet as instructed by your health care provider. Avoid heavy or fried foods that are hard to digest. °· Avoid drinking alcohol for 24 hours or as instructed by your health care provider. °· Only take over-the-counter or prescription medicines as directed by your health care provider. °· If a tissue sample (biopsy) was taken during your procedure: °¨ Do not take aspirin or blood thinners for 7 days, or as instructed by your health care provider. °¨ Do not drink alcohol for 7 days, or as instructed by your health care provider. °¨ Eat soft foods for the first 24 hours. °SEEK MEDICAL CARE IF: °You have persistent spotting of blood in your stool 2-3 days after the procedure. °SEEK IMMEDIATE MEDICAL CARE IF: °· You have more than a small spotting of blood in your stool. °· You pass large blood clots in your stool. °· Your abdomen is swollen  (distended). °· You have nausea or vomiting. °· You have a fever. °· You have increasing abdominal pain that is not relieved with medicine. °  °This information is not intended to replace advice given to you by your health care provider. Make sure you discuss any questions you have with your health care provider. °  °Document Released: 07/12/2004 Document Revised: 09/18/2013 Document Reviewed: 08/05/2013 °Elsevier Interactive Patient Education ©2016 [employer] ° °

## 2016-02-15 NOTE — Op Note (Signed)
Procedure: Surveillance colonoscopy and diagnostic esophagogastroduodenoscopy. February 2012 surveillance colonoscopy performed with removal of a 4 mm tubular adenomatous descending colon polyp. Chronic gastroesophageal reflux.  Endoscopist: Earle Gell  Premedication: Propofol administered by anesthesia  Procedure: Diagnostic esophagogastroduodenoscopy The patient was placed in the left lateral decubitus position. The Pentax gastroscope was passed through the posterior hypopharynx into the proximal esophagus without difficulty. I did not visualize the vocal cords  The proximal, mid, and lower segments of the esophageal mucosa appeared normal. The squamocolumnar junction was regular in appearance and noted at 40 cm from the incisor teeth. There was no endoscopic evidence for the presence of erosive esophagitis, esophageal stricture formation, or Barrett's esophagus  Gastroscopy: Retroflex view of the gastric cardia and fundus was normal. The gastric body, antrum, and pylorus appeared normal.  Duodenoscopy: The duodenal bulb and descending duodenum appeared normal.  Assessment: Normal esophagogastroduodenoscopy  Procedure: Surveillance colonoscopy Anal inspection and digital rectal exam were normal. The Pentax pediatric colonoscope was introduced into the rectum and advanced to the cecum. A normal-appearing appendiceal orifice was identified. A normal-appearing ileocecal valve was intubated and the terminal ileum inspected. Colonic preparation for the exam today was good. Withdrawal time was 9 minutes  Rectum. Normal. Retroflexed view of the distal rectum was normal  Sigmoid colon and descending colon. Colonic diverticulosis  Splenic flexure. Normal  Transverse colon. Normal  Hepatic flexure. Normal  Ascending colon. Normal  Cecum and ileocecal valve. Normal  Terminal ileum. Normal  Assessment: Normal surveillance colonoscopy  Recommendation: Schedule repeat surveillance  colonoscopy in approximately 5 minutes

## 2016-04-13 DIAGNOSIS — H04123 Dry eye syndrome of bilateral lacrimal glands: Secondary | ICD-10-CM | POA: Diagnosis not present

## 2016-04-13 DIAGNOSIS — D3131 Benign neoplasm of right choroid: Secondary | ICD-10-CM | POA: Diagnosis not present

## 2016-04-13 DIAGNOSIS — H35372 Puckering of macula, left eye: Secondary | ICD-10-CM | POA: Diagnosis not present

## 2016-04-13 DIAGNOSIS — H2513 Age-related nuclear cataract, bilateral: Secondary | ICD-10-CM | POA: Diagnosis not present

## 2016-04-18 ENCOUNTER — Other Ambulatory Visit: Payer: Medicare Other

## 2016-05-05 DIAGNOSIS — D2239 Melanocytic nevi of other parts of face: Secondary | ICD-10-CM | POA: Diagnosis not present

## 2016-05-05 DIAGNOSIS — L821 Other seborrheic keratosis: Secondary | ICD-10-CM | POA: Diagnosis not present

## 2016-05-05 DIAGNOSIS — D2261 Melanocytic nevi of right upper limb, including shoulder: Secondary | ICD-10-CM | POA: Diagnosis not present

## 2016-05-05 DIAGNOSIS — Z85828 Personal history of other malignant neoplasm of skin: Secondary | ICD-10-CM | POA: Diagnosis not present

## 2016-05-05 DIAGNOSIS — L570 Actinic keratosis: Secondary | ICD-10-CM | POA: Diagnosis not present

## 2016-05-05 DIAGNOSIS — L820 Inflamed seborrheic keratosis: Secondary | ICD-10-CM | POA: Diagnosis not present

## 2016-05-05 DIAGNOSIS — D225 Melanocytic nevi of trunk: Secondary | ICD-10-CM | POA: Diagnosis not present

## 2016-05-05 DIAGNOSIS — Z85820 Personal history of malignant melanoma of skin: Secondary | ICD-10-CM | POA: Diagnosis not present

## 2016-05-05 DIAGNOSIS — D2262 Melanocytic nevi of left upper limb, including shoulder: Secondary | ICD-10-CM | POA: Diagnosis not present

## 2016-05-05 DIAGNOSIS — D1801 Hemangioma of skin and subcutaneous tissue: Secondary | ICD-10-CM | POA: Diagnosis not present

## 2016-05-20 ENCOUNTER — Telehealth: Payer: Self-pay | Admitting: Pharmacist

## 2016-05-20 NOTE — Telephone Encounter (Signed)
Pt called to report bloating, cramping, and diarrhea with Zetia 10mg . He noticed that sx disappeared within 24 hours of stopping drug. Will have him trial cutting tablets in half and have him take Zetia with dinner. Trials have shown similar efficacy and better GI tolerability with 5mg  dose. Rechecking cholesterol in a month.

## 2016-05-23 ENCOUNTER — Telehealth: Payer: Self-pay | Admitting: Interventional Cardiology

## 2016-05-23 NOTE — Telephone Encounter (Signed)
Error

## 2016-05-24 ENCOUNTER — Other Ambulatory Visit: Payer: Medicare Other

## 2016-06-06 ENCOUNTER — Ambulatory Visit (INDEPENDENT_AMBULATORY_CARE_PROVIDER_SITE_OTHER): Payer: Medicare Other | Admitting: Physician Assistant

## 2016-06-06 ENCOUNTER — Encounter: Payer: Self-pay | Admitting: Physician Assistant

## 2016-06-06 VITALS — BP 166/78 | HR 80 | Ht 69.0 in | Wt 174.4 lb

## 2016-06-06 DIAGNOSIS — R0789 Other chest pain: Secondary | ICD-10-CM | POA: Diagnosis not present

## 2016-06-06 DIAGNOSIS — I10 Essential (primary) hypertension: Secondary | ICD-10-CM

## 2016-06-06 DIAGNOSIS — I25701 Atherosclerosis of coronary artery bypass graft(s), unspecified, with angina pectoris with documented spasm: Secondary | ICD-10-CM | POA: Diagnosis not present

## 2016-06-06 DIAGNOSIS — F419 Anxiety disorder, unspecified: Secondary | ICD-10-CM

## 2016-06-06 DIAGNOSIS — E785 Hyperlipidemia, unspecified: Secondary | ICD-10-CM | POA: Diagnosis not present

## 2016-06-06 LAB — LIPID PANEL
CHOLESTEROL: 172 mg/dL (ref 125–200)
HDL: 72 mg/dL (ref >=40)
LDL Cholesterol: 83 mg/dL (ref <130)
TRIGLYCERIDES: 87 mg/dL (ref <150)
Total CHOL/HDL Ratio: 2.4 Ratio (ref <=5.0)
VLDL: 17 mg/dL (ref <30)

## 2016-06-06 LAB — HEPATIC FUNCTION PANEL
ALBUMIN: 4.1 g/dL (ref 3.6–5.1)
ALT: 17 U/L (ref 9–46)
AST: 19 U/L (ref 10–35)
Alkaline Phosphatase: 49 U/L (ref 40–115)
BILIRUBIN DIRECT: 0.1 mg/dL (ref <=0.2)
Indirect Bilirubin: 0.5 mg/dL (ref 0.2–1.2)
TOTAL PROTEIN: 6.4 g/dL (ref 6.1–8.1)
Total Bilirubin: 0.6 mg/dL (ref 0.2–1.2)

## 2016-06-06 LAB — BASIC METABOLIC PANEL
BUN: 20 mg/dL (ref 7–25)
CHLORIDE: 104 mmol/L (ref 98–110)
CO2: 27 mmol/L (ref 20–31)
CREATININE: 0.78 mg/dL (ref 0.70–1.18)
Calcium: 8.7 mg/dL (ref 8.6–10.3)
Glucose, Bld: 112 mg/dL — ABNORMAL HIGH (ref 65–99)
Potassium: 4.1 mmol/L (ref 3.5–5.3)
Sodium: 138 mmol/L (ref 135–146)

## 2016-06-06 MED ORDER — NITROGLYCERIN 0.4 MG SL SUBL: 0.4000 mg | SUBLINGUAL_TABLET | SUBLINGUAL | Status: DC | PRN

## 2016-06-06 NOTE — Patient Instructions (Addendum)
Medication Instructions:  Use your NTG under your tongue for recurrent chest pain. May take one tablet every 5 minutes. If you are still having discomfort after 3 tablets in 15 minutes, call 911.  Labwork: Lp/hfp/bmet  today   Testing/Procedures: NONE  Follow-Up: Your physician recommends that you schedule a follow-up appointment in: Pharm D tomorrow or Wednesday   Your physician recommends that you schedule a follow-up appointment in: Dr Tamala Julian next available   If you need a refill on your cardiac medications before your next appointment, please call your pharmacy.

## 2016-06-06 NOTE — Progress Notes (Signed)
Cardiology Office Note    Date:  06/06/2016   ID:  Nathan [name], Nathan [name] 1946-08-27, MRN [id number]  PCP:  Nathan Fair, MD  Cardiologist: Dr. Tamala [name]  [id number] pressure.  History of Present Illness:  Nathan [name] is a 70 y.o. male  CAD S/P CABG, HTN, HLD. Was most recently seen in the pharmacy clinic for hypertension hyperlipidemia because of multiple side effects from his medications. He has white coat syndrome and was to call the clinic if his blood pressures were over 150/90. He was also started on Zetia. He was having bloating cramping and diarrhea with Zetia 10 mg and was told to cut the tablets in half and take with food. Patient still having bloating and diarrhea when he takes it.   Low risk myoview 07/2015.  Under extreme stress. Lost his partner of 26 yrs in April. He notices a heaviness in his epigastric region. Lasts hours and relieved only with getting himself out of the stressful situation. Walks on treadmill 35 min or walks 3-4 miles daily without any symptoms. Blood pressure is running high as well.BP's running 180/80's.Has had significant reactions to any medication he has taken.    Past Medical History  Diagnosis Date  . Arthritis   . Blood transfusion without reported diagnosis   . Wears glasses   . Coronary artery disease   . GERD (gastroesophageal reflux disease)   . Seasonal allergies   . Hypertension   . Hyperlipemia   . Complication of anesthesia     jittery after surgery    Past Surgical History  Procedure Laterality Date  . Knee arthrocentesis  1980    right  . Shoulder acromioplasty  1985    rt  . Sinus endo w/fusion    . Tonsillectomy and adenoidectomy    . Exploration post operative open heart    . Appendectomy      age 28  . Colonoscopy    . Eye surgery      detached retina-lt  . Inguinal hernia repair Left 04/01/2014    Procedure: HERNIA REPAIR INGUINAL ADULT;  Surgeon: Nathan Medal, MD;  Location: [facility];   Service: General;  Laterality: Left;  . Insertion of mesh Left 04/01/2014    Procedure: INSERTION OF MESH;  Surgeon: Nathan Medal, MD;  Location: [facility];  Service: General;  Laterality: Left;  . Coronary artery bypass graft      2003  . Colonoscopy with propofol N/A 02/15/2016    Procedure: COLONOSCOPY WITH PROPOFOL;  Surgeon: Nathan Fair, MD;  Location: [facility];  Service: Endoscopy;  Laterality: N/A;  . Esophagogastroduodenoscopy (egd) with propofol N/A 02/15/2016    Procedure: ESOPHAGOGASTRODUODENOSCOPY (EGD) WITH PROPOFOL;  Surgeon: Nathan Fair, MD;  Location: [facility];  Service: Endoscopy;  Laterality: N/A;    Current Medications: Outpatient Prescriptions Prior to Visit  Medication Sig Dispense Refill  . aspirin EC 81 MG tablet Take 81 mg by mouth daily.    . cetirizine (ZYRTEC) 10 MG tablet Take 10 mg by mouth daily.    . cycloSPORINE (RESTASIS) 0.05 % ophthalmic emulsion Place 1 drop into both eyes 2 (two) times daily.     Marland Kitchen ezetimibe (ZETIA) 10 MG tablet Take 0.5 tablets (5 mg total) by mouth daily. 90 tablet 3  . flurazepam (DALMANE) 30 MG capsule Take 30 mg by mouth at bedtime as needed for sleep.    Marland Kitchen triamcinolone (NASACORT) 55 MCG/ACT AERO nasal inhaler Place 2  sprays into the nose daily.    Marland Kitchen triamcinolone cream (KENALOG) 0.1 % APPLY TO AFFECTED AREA ON THE SKIN TWICE DAILY AS NEEDED, FOR SKIN IRRITATION AS DIRECTED  0  . Urea 45 % CREA Apply 1 application topically daily. 225 g 2  . naproxen sodium (ANAPROX) 220 MG tablet Take 220 mg by mouth 2 (two) times daily with a meal. Reported on 06/06/2016     No facility-administered medications prior to visit.     Allergies:   Lipitor; Bystolic; Neosporin; Pravastatin; and Toprol xl   Social History   Social History  . Marital Status: Single    Spouse Name: N/A  . Number of Children: N/A  . Years of Education: post grad   Occupational History  . [employer]    Social History Main Topics  .  Smoking status: Former Smoker    Quit date: 03/26/1969  . Smokeless tobacco: Never Used  . Alcohol Use: 0.0 oz/week    0 Standard drinks or equivalent per week     Comment: 2 per day  . Drug Use: No  . Sexual Activity: Not Asked   Other Topics Concern  . None   Social History Narrative   Patient drinks 1-2 cups of caffeine daily.   Patient is left handed.      Family History:  The patient's family history includes Alcohol abuse in his brother; Cancer in his father; Heart disease in his mother; Kidney failure in his brother.   ROS:   Please see the history of present illness.    Review of Systems  Cardiovascular: Positive for chest pain.  Gastrointestinal: Positive for diarrhea.  Psychiatric/Behavioral: The patient is nervous/anxious.    All other systems reviewed and are negative.   PHYSICAL EXAM:   VS:  BP 166/78 mmHg  Pulse 80  Ht 5\' 9"  (1.753 m)  Wt 174 lb 6.4 oz (79.107 kg)  BMI 25.74 kg/m2  Physical Exam  GEN: Well nourished, well developed, in no acute distress,Tearful, very anxious Neck: no JVD, carotid bruits, or masses Cardiac:RRR; positive S4, no murmurs, rubs Respiratory:  clear to auscultation bilaterally, normal work of breathing GI: soft, nontender, nondistended, + BS Ext: without cyanosis, clubbing, or edema, Good distal pulses bilaterally MS: no deformity or atrophy Skin: warm and dry, no rash Neuro:  Alert and Oriented x 3, Strength and sensation are intact Psych: Tearful and anxious  Wt Readings from Last 3 Encounters:  06/06/16 174 lb 6.4 oz (79.107 kg)  01/04/16 177 lb (80.287 kg)  09/14/15 179 lb (81.194 kg)      Studies/Labs Reviewed:   EKG:  EKG is ordered today.  The ekg ordered today demonstrates Normal sinus rhythm, no acute change  Recent Labs: 07/09/2015: ALT 25   Lipid Panel    Component Value Date/Time   CHOL 159 07/09/2015 0753   TRIG 74.0 07/09/2015 0753   HDL 55.30 07/09/2015 0753   CHOLHDL 3 07/09/2015 0753   VLDL  14.8 07/09/2015 0753   LDLCALC 88 07/09/2015 0753    Additional studies/ records that were reviewed today include:  Low risk nuclear stress test 07/2015  Study Highlights      Nuclear stress EF: 63%.  There was no ST segment deviation noted during stress.  The study is normal.  This is a low risk study.  The left ventricular ejection fraction is normal (55-65%).   Normal nuclear study with no prior scar or ischemia.         ASSESSMENT:  1. Hyperlipidemia   2. Essential hypertension   3. Chest pressure   4. Coronary artery disease involving coronary bypass graft of native heart with angina pectoris with documented spasm ([place])   5. Anxiety      PLAN:  In order of problems listed above: Hyperlipidemia patient is having nausea and diarrhea on Zetia. Discussed with Jinny Blossom who concurs the patient can stop the Zetia. She will see if he qualifies for a clinical trial at [facility]. Check fasting lipid panel and LFTs today.  Essential hypertension patient's blood pressure is running high is mostly related to stress and anxiety. He has had several reactions to antihypertensives in the past but they are not all listed. We'll check a bmet today and if it is stable would recommend starting low dose ACE inhibitor. Have scheduled him an appointment to see Jinny Blossom in the pharmacy this week to initiate drug therapy and monitor this.  Chest pressure in the setting of stress and anxiety. He has no symptoms when walking or exercising. Will prescribe sublingual nitroglycerin. He had a normal nuclear stress test in August 2016. We'll hold off on further testing at this time. Follow-up with Dr. Tamala [name].  CAD with prior CABG having some chest pressure as described above  Anxiety surrounding the loss of his partner. Recommend he follow-up with his primary care physician. He may benefit from low-dose when necessary anxiolytics. He is in counseling.     Medication Adjustments/Labs and Tests  Ordered: Current medicines are reviewed at length with the patient today.  Concerns regarding medicines are outlined above.  Medication changes, Labs and Tests ordered today are listed in the Patient Instructions below. Patient Instructions  Medication Instructions:  Use your NTG under your tongue for recurrent chest pain. May take one tablet every 5 minutes. If you are still having discomfort after 3 tablets in 15 minutes, call 911.  Labwork: Lp/hfp/bmet  today   Testing/Procedures: NONE  Follow-Up: Your physician recommends that you schedule a follow-up appointment in: Pharm D tomorrow or Wednesday   Your physician recommends that you schedule a follow-up appointment in: Dr Nathan [name] next available   If you need a refill on your cardiac medications before your next appointment, please call your pharmacy.      Sumner Boast, PA-C  06/06/2016 8:54 AM    [facility] [address], [place], [place]  [address] Phone: [phone]; Fax: [phone]

## 2016-06-07 ENCOUNTER — Ambulatory Visit (INDEPENDENT_AMBULATORY_CARE_PROVIDER_SITE_OTHER): Payer: Medicare Other | Admitting: Pharmacist

## 2016-06-07 VITALS — BP 150/64 | HR 88

## 2016-06-07 DIAGNOSIS — I10 Essential (primary) hypertension: Secondary | ICD-10-CM

## 2016-06-07 DIAGNOSIS — E785 Hyperlipidemia, unspecified: Secondary | ICD-10-CM

## 2016-06-07 DIAGNOSIS — I25701 Atherosclerosis of coronary artery bypass graft(s), unspecified, with angina pectoris with documented spasm: Secondary | ICD-10-CM

## 2016-06-07 MED ORDER — AMLODIPINE BESYLATE 5 MG PO TABS: 5.0000 mg | ORAL_TABLET | Freq: Every day | ORAL | Status: DC

## 2016-06-07 NOTE — Progress Notes (Signed)
Patient ID: JIRAIYA FORSHEE                 DOB: 10-07-1946                    MRN: [id number]     HPI: Nathan [name] is a 70 yo male who has previously been seen in pharmacy clinic for lipid and blood pressure management who presents today for follow up. Patient has a PMH significant for CAD, prior CABG in 2005, HTN, and HLD.   Patient has a history of rash with pravastatin, simvastatin, and atorvastatin - each time the rash presented bilaterally and was severe enough on his torso, legs, and shoulders that he needed steroids and a cream from his dermatologist to mediate the rash. The rashes were red, itchy, inflamed, blistering, and oozing. Steroids helped, but drug d/c was needed in each case to get rid of the rash completely. He also experienced vivid dreams and a bad taste in his mouth with rosuvastatin. Pt is also intolerant to Zetia 5mg  and 10mg  daily - he experienced diarrhea, cramping, and bloating, even with dose reduction and PM dosing with food. GI upset resolved within a day of drug discontinuation at each dose.  Patient states he has tried metoprolol and nebivolol for blood pressure in the past but experienced a rash with both medications. He is currently not taking anything for blood pressure control. Patient reports having white coat syndrome and also has anxiety when checking his blood pressure at home himself. He is also under a lot of stress. He lost his partner of 26 years in April. His home systolic BP readings have ranged from 160s-170s.  Current HTN meds: none Previously tried: metoprolol and nebivolol (rash) BP goal: <140/90 mmHg  Current Lipid Meds: none Intolerances: atorvastatin 10mg  daily, low dose pravastatin and simvastatin (rash), low dose rosuvastatin (bad taste and vivid dreams), Zetia 5mg  and 10mg  daily - bloating, cramping, and diarrhea Risk Factors: CAD, s/p CABG Goals: LDL 70mg /dL  Exercise: Treadmill for 40 mins or walks 3-4 miles daily.  Diet: Eats a lot of  salmon and seafood. Cooks at home and rarely eats out. Likes granola with fruit. Snacks on fruit.  Family History: No family history of heart disease or stroke.  Social History: The patient reports that he quit smoking about 46 years ago. He does not have any smokeless tobacco history on file. He reports that he drinks alcohol. He reports that he does not use illicit drugs.   Home BP readings: lowest 135, highest 160 on 2/4, have since decreased into the 140s/80s.  Labs:  05/2016: TC 172, TG 87, HDL 72, LDL 83, LFTs wnl (Zetia 10mg  then 5mg  daily - d/c'ing d/t GI effect) 06/2015: TC 159, HDL 55.3, LDL 88, TG 74 (on atorvastatin 10mg )   Past Medical History  Diagnosis Date  . Arthritis   . Blood transfusion without reported diagnosis   . Wears glasses   . Coronary artery disease   . GERD (gastroesophageal reflux disease)   . Seasonal allergies   . Hypertension   . Hyperlipemia   . Complication of anesthesia     jittery after surgery    Current Outpatient Prescriptions on File Prior to Visit  Medication Sig Dispense Refill  . aspirin EC 81 MG tablet Take 81 mg by mouth daily.    . cetirizine (ZYRTEC) 10 MG tablet Take 10 mg by mouth daily.    . cycloSPORINE (RESTASIS) 0.05 % ophthalmic emulsion  Place 1 drop into both eyes 2 (two) times daily.     Marland Kitchen ezetimibe (ZETIA) 10 MG tablet Take 0.5 tablets (5 mg total) by mouth daily. 90 tablet 3  . flurazepam (DALMANE) 30 MG capsule Take 30 mg by mouth at bedtime as needed for sleep.    . nitroGLYCERIN (NITROSTAT) 0.4 MG SL tablet Place 1 tablet (0.4 mg total) under the tongue every 5 (five) minutes as needed for chest pain. 25 tablet prn  . triamcinolone (NASACORT) 55 MCG/ACT AERO nasal inhaler Place 2 sprays into the nose daily.    Marland Kitchen triamcinolone cream (KENALOG) 0.1 % APPLY TO AFFECTED AREA ON THE SKIN TWICE DAILY AS NEEDED, FOR SKIN IRRITATION AS DIRECTED  0  . Urea 45 % CREA Apply 1 application topically daily. 225 g 2   No current  facility-administered medications on file prior to visit.    Allergies  Allergen Reactions  . Lipitor [Atorvastatin] Rash  . Bystolic [Nebivolol Hcl] Rash  . Neosporin [Neomycin-Bacitracin Zn-Polymyx] Rash  . Pravastatin Rash  . Toprol Xl [Metoprolol Tartrate] Rash    Assessment/Plan:  1. Hyperlipidemia - LDL above goal < 70mg /dL given history of CAD and CABG in 2005. Pt is intolerant to 4 statins, including low dose atorvastatin (severe bilateral rash that required steroids and drug discontinuation to resolve). He is also intolerant to Zetia (GI effects on 5mg  and 10mg  dose). Discussed expected benefits, side effects, and injection technique with [id number]. Will pursue paperwork for Repatha injections.  2. Hypertension - BP above goal  <140/43mmHg. Stress likely a contributing factor. Pt not currently on any BP lowering medications. Will initiate amlodipine 5mg  daily. F/u in clinic in 2 weeks.   Nathan [name], PharmD, [place] [id number], [place], [place] [address] Phone: [phone]; Fax: [phone] 06/07/2016 4:29 PM

## 2016-06-07 NOTE — Patient Instructions (Signed)
Pick up your prescription for amlodipine to start taking 5mg  once a day.  Continue to check your blood pressure. Ideally, we would like to see your blood pressure consistently < 140/90.  I will start the paperwork for Repatha cholesterol injections.  Follow up in blood pressure clinic in 2 weeks.

## 2016-06-08 ENCOUNTER — Telehealth: Payer: Self-pay | Admitting: Interventional Cardiology

## 2016-06-08 NOTE — Telephone Encounter (Signed)
Spoke with pt and he has been made aware of his lab results. 

## 2016-06-08 NOTE — Telephone Encounter (Signed)
F/u Message ° °Pt returning RN call. Please call back to discuss  °

## 2016-06-20 ENCOUNTER — Telehealth: Payer: Self-pay | Admitting: Pharmacist

## 2016-06-20 MED ORDER — EVOLOCUMAB 140 MG/ML ~~LOC~~ SOAJ
1.0000 | SUBCUTANEOUS | Status: DC
Start: 2016-06-20 — End: 2017-04-21

## 2016-06-20 NOTE — Telephone Encounter (Signed)
Repatha approved by patient's insurance through 09/13/16. Rx sent to [facility] and pt made aware. He will call when he receives first shipment so we can set up labwork.

## 2016-06-21 ENCOUNTER — Other Ambulatory Visit: Payer: Medicare Other

## 2016-06-23 ENCOUNTER — Ambulatory Visit (INDEPENDENT_AMBULATORY_CARE_PROVIDER_SITE_OTHER): Payer: Medicare Other | Admitting: Pharmacist

## 2016-06-23 VITALS — BP 134/62 | HR 81

## 2016-06-23 DIAGNOSIS — E785 Hyperlipidemia, unspecified: Secondary | ICD-10-CM | POA: Diagnosis not present

## 2016-06-23 DIAGNOSIS — I10 Essential (primary) hypertension: Secondary | ICD-10-CM

## 2016-06-23 DIAGNOSIS — I25701 Atherosclerosis of coronary artery bypass graft(s), unspecified, with angina pectoris with documented spasm: Secondary | ICD-10-CM | POA: Diagnosis not present

## 2016-06-23 NOTE — Progress Notes (Signed)
Patient ID: Nathan [name]                 DOB: 1946-12-07                      MRN: [id number]     HPI: Nathan [name] is a 70 yo male who has previously been seen in pharmacy clinic for lipid and blood pressure management who presents today for follow up. Patient has a PMH significant for CAD, prior CABG in 2005, HTN, and HLD. Repatha paperwork initiated at last visit for HLD and amlodipine added for HTN control.  Repatha has since been approved - fist shipment is coming tomorrow.  Pt reports BP has improved - 145/83, HR 79 this AM. Overall seeing 140/80-150/90s - this is improved from baseline of 160-170s. Feeling much better overall. Denies headache, dizziness, or blurred vision since starting amlodipine. Brings home cuff to clinic today. Home cuff reading: 134/78, HR 80. Clinic reading: HR 81  Current HTN meds: amlodipine 5mg  Previously tried: metoprolol and nebivolol (rash) BP goal: <140/90 mmHg  Current Lipid Meds: Repatha approved, hasn't started yet Intolerances: atorvastatin 10mg  daily, low dose pravastatin and simvastatin (rash), low dose rosuvastatin (bad taste and vivid dreams), Zetia 5mg  and 10mg  daily - bloating, cramping, and diarrhea Risk Factors: CAD, s/p CABG Goals: LDL 70mg /dL  Exercise: Treadmill for 40 mins or walks 3-4 miles daily.  Diet: Eats a lot of salmon and seafood. Cooks at home and rarely eats out. Likes granola with fruit. Snacks on fruit.  Family History: No family history of heart disease or stroke.  Social History: The patient reports that he quit smoking about 46 years ago. He does not have any smokeless tobacco history on file. He reports that he drinks alcohol. He reports that he does not use illicit drugs.   Home BP readings: lowest 135, highest 160 on 2/4, have since decreased into the 140s/80s.  Labs:  05/2016: TC 172, TG 87, HDL 72, LDL 83, LFTs wnl (Zetia 10mg  then 5mg  daily - d/c'ing d/t GI effect) 06/2015: TC 159, HDL 55.3, LDL 88, TG 74 (on  atorvastatin 10mg )  Wt Readings from Last 3 Encounters:  06/06/16 174 lb 6.4 oz (79.107 kg)  01/04/16 177 lb (80.287 kg)  09/14/15 179 lb (81.194 kg)   BP Readings from Last 3 Encounters:  06/07/16 150/64  06/06/16 166/78  02/15/16 127/65   Pulse Readings from Last 3 Encounters:  06/07/16 88  06/06/16 80  02/15/16 79    Renal function: CrCl cannot be calculated (Unknown ideal weight.).  Past Medical History  Diagnosis Date  . Arthritis   . Blood transfusion without reported diagnosis   . Wears glasses   . Coronary artery disease   . GERD (gastroesophageal reflux disease)   . Seasonal allergies   . Hypertension   . Hyperlipemia   . Complication of anesthesia     jittery after surgery    Current Outpatient Prescriptions on File Prior to Visit  Medication Sig Dispense Refill  . amLODipine (NORVASC) 5 MG tablet Take 1 tablet (5 mg total) by mouth daily. 30 tablet 11  . aspirin EC 81 MG tablet Take 81 mg by mouth daily.    . cetirizine (ZYRTEC) 10 MG tablet Take 10 mg by mouth daily.    . cycloSPORINE (RESTASIS) 0.05 % ophthalmic emulsion Place 1 drop into both eyes 2 (two) times daily.     . Evolocumab (REPATHA SURECLICK) [id number] MG/ML SOAJ Inject  1 pen into the skin every 14 (fourteen) days. 2 pen 11  . flurazepam (DALMANE) 30 MG capsule Take 30 mg by mouth at bedtime as needed for sleep.    . nitroGLYCERIN (NITROSTAT) 0.4 MG SL tablet Place 1 tablet (0.4 mg total) under the tongue every 5 (five) minutes as needed for chest pain. 25 tablet prn  . triamcinolone (NASACORT) 55 MCG/ACT AERO nasal inhaler Place 2 sprays into the nose daily.    Marland Kitchen triamcinolone cream (KENALOG) 0.1 % APPLY TO AFFECTED AREA ON THE SKIN TWICE DAILY AS NEEDED, FOR SKIN IRRITATION AS DIRECTED  0  . Urea 45 % CREA Apply 1 application topically daily. 225 g 2   No current facility-administered medications on file prior to visit.    Allergies  Allergen Reactions  . Lipitor [Atorvastatin] Rash  .  Bystolic [Nebivolol Hcl] Rash  . Lexiscan [Regadenoson] Other (See Comments)    Sharp sternal pain  . Zetia [Ezetimibe] Diarrhea    GI effects - bloating, cramping, diarrhea on 5mg  and 10mg  dose  . Neosporin [Neomycin-Bacitracin Zn-Polymyx] Rash  . Pravastatin Rash  . Toprol Xl [Metoprolol Tartrate] Rash     Assessment/Plan:  1. Hyperlipidemia - Repatha approved since last visit. First shipment expected to arrive tomorrow. Scheduled lab work for the end of August. Counseled on injection technique in clinic again. Pt will call if he would like to come to clinic to give his first injection.  2. Hypertension - BP at goal < 140/12mmHg since initiating amlodipine 5mg  at last visit. Home cuff brought to clinic and readings are accurate when compared to clinic reading. No medication changes - pt advised to call clinic if his SBP is consistently > 164mmHg.   Megan E. Supple, PharmD, [place] [id number] [place], [place] [address] Phone: [phone]; Fax: [phone] 06/23/2016 1:49 PM

## 2016-06-23 NOTE — Patient Instructions (Signed)
Your blood pressure was excellent today - 134/62.  No medication changes today.  Come in for fasting blood work on Monday, August 28th. Lab opens at 7:30am, come in any time after.

## 2016-06-27 ENCOUNTER — Ambulatory Visit: Payer: Medicare Other | Admitting: Physician Assistant

## 2016-07-08 ENCOUNTER — Emergency Department (HOSPITAL_COMMUNITY): Payer: Medicare Other

## 2016-07-08 ENCOUNTER — Emergency Department (HOSPITAL_COMMUNITY)
Admission: EM | Admit: 2016-07-08 | Discharge: 2016-07-08 | Disposition: A | Discharge status: Home / Self Care | Payer: Medicare Other | Attending: Emergency Medicine | Admitting: Emergency Medicine

## 2016-07-08 ENCOUNTER — Encounter (HOSPITAL_COMMUNITY): Payer: Self-pay

## 2016-07-08 DIAGNOSIS — Z87891 Personal history of nicotine dependence: Secondary | ICD-10-CM | POA: Insufficient documentation

## 2016-07-08 DIAGNOSIS — K5733 Diverticulitis of large intestine without perforation or abscess with bleeding: Secondary | ICD-10-CM | POA: Diagnosis not present

## 2016-07-08 DIAGNOSIS — Z951 Presence of aortocoronary bypass graft: Secondary | ICD-10-CM | POA: Diagnosis not present

## 2016-07-08 DIAGNOSIS — Z7982 Long term (current) use of aspirin: Secondary | ICD-10-CM | POA: Diagnosis not present

## 2016-07-08 DIAGNOSIS — K5731 Diverticulosis of large intestine without perforation or abscess with bleeding: Secondary | ICD-10-CM | POA: Insufficient documentation

## 2016-07-08 DIAGNOSIS — K625 Hemorrhage of anus and rectum: Secondary | ICD-10-CM | POA: Diagnosis present

## 2016-07-08 DIAGNOSIS — I2510 Atherosclerotic heart disease of native coronary artery without angina pectoris: Secondary | ICD-10-CM | POA: Diagnosis not present

## 2016-07-08 DIAGNOSIS — Z79899 Other long term (current) drug therapy: Secondary | ICD-10-CM | POA: Diagnosis not present

## 2016-07-08 DIAGNOSIS — K5732 Diverticulitis of large intestine without perforation or abscess without bleeding: Secondary | ICD-10-CM | POA: Diagnosis not present

## 2016-07-08 DIAGNOSIS — I10 Essential (primary) hypertension: Secondary | ICD-10-CM | POA: Diagnosis not present

## 2016-07-08 LAB — CBC
HEMATOCRIT: 42.8 % (ref 39.0–52.0)
HEMOGLOBIN: 14.8 g/dL (ref 13.0–17.0)
MCH: 31.2 pg (ref 26.0–34.0)
MCHC: 34.6 g/dL (ref 30.0–36.0)
MCV: 90.3 fL (ref 78.0–100.0)
Platelets: 170 10*3/uL (ref 150–400)
RBC: 4.74 MIL/uL (ref 4.22–5.81)
RDW: 11.9 % (ref 11.5–15.5)
WBC: 6.4 10*3/uL (ref 4.0–10.5)

## 2016-07-08 LAB — COMPREHENSIVE METABOLIC PANEL
ALK PHOS: 53 U/L (ref 38–126)
ALT: 21 U/L (ref 17–63)
AST: 21 U/L (ref 15–41)
Albumin: 4.3 g/dL (ref 3.5–5.0)
Anion gap: 6 (ref 5–15)
BILIRUBIN TOTAL: 0.7 mg/dL (ref 0.3–1.2)
BUN: 14 mg/dL (ref 6–20)
CHLORIDE: 107 mmol/L (ref 101–111)
CO2: 24 mmol/L (ref 22–32)
Calcium: 9 mg/dL (ref 8.9–10.3)
Creatinine, Ser: 0.68 mg/dL (ref 0.61–1.24)
GFR calc Af Amer: >60 mL/min (ref >60)
Glucose, Bld: 118 mg/dL — ABNORMAL HIGH (ref 65–99)
Potassium: 4.2 mmol/L (ref 3.5–5.1)
Sodium: 137 mmol/L (ref 135–145)
Total Protein: 7.5 g/dL (ref 6.5–8.1)

## 2016-07-08 LAB — URINALYSIS, ROUTINE W REFLEX MICROSCOPIC
Bilirubin Urine: NEGATIVE
GLUCOSE, UA: NEGATIVE mg/dL
Hgb urine dipstick: NEGATIVE
Ketones, ur: NEGATIVE mg/dL
LEUKOCYTES UA: NEGATIVE
Nitrite: NEGATIVE
PH: 6.5 (ref 5.0–8.0)
Protein, ur: NEGATIVE mg/dL
SPECIFIC GRAVITY, URINE: 1.018 (ref 1.005–1.030)

## 2016-07-08 LAB — TYPE AND SCREEN
ABO/RH(D): B POS
Antibody Screen: NEGATIVE

## 2016-07-08 LAB — ABO/RH: ABO/RH(D): B POS

## 2016-07-08 MED ORDER — CIPROFLOXACIN HCL 500 MG PO TABS: 500.0000 mg | ORAL_TABLET | Freq: Two times a day (BID) | ORAL | 0 refills | Status: AC

## 2016-07-08 MED ORDER — TRAMADOL HCL 50 MG PO TABS: 50.0000 mg | ORAL_TABLET | Freq: Four times a day (QID) | ORAL | 0 refills | Status: DC | PRN

## 2016-07-08 MED ORDER — IOPAMIDOL (ISOVUE-300) INJECTION 61%
100.0000 mL | Freq: Once | INTRAVENOUS | Status: AC | PRN
  Administered 2016-07-08: 100 mL via INTRAVENOUS

## 2016-07-08 MED ORDER — DIATRIZOATE MEGLUMINE & SODIUM 66-10 % PO SOLN
30.0000 mL | Freq: Once | ORAL | Status: AC
  Administered 2016-07-08: 30 mL via ORAL

## 2016-07-08 MED ORDER — METRONIDAZOLE 500 MG PO TABS: 500.0000 mg | ORAL_TABLET | Freq: Three times a day (TID) | ORAL | 0 refills | Status: AC

## 2016-07-08 MED ORDER — PIPERACILLIN-TAZOBACTAM 3.375 G IVPB
3.3750 g | Freq: Once | INTRAVENOUS | Status: AC
  Administered 2016-07-08: 3.375 g via INTRAVENOUS
  Filled 2016-07-08: qty 50

## 2016-07-08 MED ORDER — ONDANSETRON 4 MG PO TBDP: 4.0000 mg | ORAL_TABLET | Freq: Three times a day (TID) | ORAL | 0 refills | Status: DC | PRN

## 2016-07-08 MED ORDER — SODIUM CHLORIDE 0.9 % IV BOLUS (SEPSIS)
1000.0000 mL | Freq: Once | INTRAVENOUS | Status: AC
  Administered 2016-07-08: 1000 mL via INTRAVENOUS

## 2016-07-08 NOTE — ED Notes (Signed)
Pt ambulated to the BR with steady gait and no assist.  States his stool has "a string of blood attached to it."

## 2016-07-08 NOTE — ED Provider Notes (Signed)
[facility] DEPT Provider Note   CSN: [id number] Arrival date & time: 07/08/16  [id number]  First Provider Contact:  First MD Initiated Contact with Patient 07/08/16 0901        History   Chief Complaint Chief Complaint  Patient presents with  . Weakness  . Groin Pain  . Rectal Bleeding    HPI Nathan [name] is a 70 y.o. male.  HPI   Yesterday, didn't feel great, fatigued, then afternoon felt lethargic, normally is pretty active. Went to dinner, thought maybe was overheated, but got home and felt tired.  Then last night started having shaking chills.  130AM woke up with feeling that needed to urinate and have BM. During night felt again like having BM, 2 more times, small BM, not diarrhea.  Small BM, bright red blood on toilet paper, Went again this AM and had mucous and bloody and no stool (at least twice) not sure last night as it was dark.  This AM saw one episode about .5cup of blood/mucus stuck together.  No blood clots.  No melena.  During night some sharp pain in groin for a second Pressure bilateral lower abdomen, worse on the left  Repatha new medicine started first injection 2 weeks Past Medical History:  Diagnosis Date  . Arthritis   . Blood transfusion without reported diagnosis   . Complication of anesthesia    jittery after surgery  . Coronary artery disease   . GERD (gastroesophageal reflux disease)   . Hyperlipemia   . Hypertension   . Seasonal allergies   . Wears glasses     Patient Active Problem List   Diagnosis Date Noted  . Anxiety 06/06/2016  . Paresthesia and pain of right extremity 01/04/2016  . CAD (coronary artery disease) of artery bypass graft 06/19/2014  . Hyperlipidemia 06/19/2014  . Essential hypertension 06/19/2014  . Inguinal hernia, left 03/12/2014    Past Surgical History:  Procedure Laterality Date  . APPENDECTOMY     age 61  . COLONOSCOPY    . COLONOSCOPY WITH PROPOFOL N/A 02/15/2016   Procedure: COLONOSCOPY WITH PROPOFOL;   Surgeon: Garlan Fair, MD;  Location: [facility];  Service: Endoscopy;  Laterality: N/A;  . CORONARY ARTERY BYPASS GRAFT     2003  . ESOPHAGOGASTRODUODENOSCOPY (EGD) WITH PROPOFOL N/A 02/15/2016   Procedure: ESOPHAGOGASTRODUODENOSCOPY (EGD) WITH PROPOFOL;  Surgeon: Garlan Fair, MD;  Location: [facility];  Service: Endoscopy;  Laterality: N/A;  . EXPLORATION POST OPERATIVE OPEN HEART    . EYE SURGERY     detached retina-lt  . INGUINAL HERNIA REPAIR Left 04/01/2014   Procedure: HERNIA REPAIR INGUINAL ADULT;  Surgeon: Shann Medal, MD;  Location: [facility];  Service: General;  Laterality: Left;  . INSERTION OF MESH Left 04/01/2014   Procedure: INSERTION OF MESH;  Surgeon: Shann Medal, MD;  Location: [facility];  Service: General;  Laterality: Left;  . KNEE ARTHROCENTESIS  1980   right  . SHOULDER ACROMIOPLASTY  1985   rt  . SINUS ENDO W/FUSION    . TONSILLECTOMY AND ADENOIDECTOMY      OB History    No data available       Home Medications    Prior to Admission medications   Medication Sig Start Date End Date Taking? Authorizing Provider  acetaminophen (TYLENOL) 500 MG tablet Take 1,000 mg by mouth every 6 (six) hours as needed for moderate pain or headache.   Yes Historical Provider, MD  amLODipine (NORVASC) 5 MG tablet Take 1 tablet (5 mg total) by mouth daily. 06/07/16  Yes Belva Crome, MD  aspirin EC 81 MG tablet Take 81 mg by mouth daily.   Yes Historical Provider, MD  Carboxymethylcellulose Sodium 0.25 % SOLN Apply 1 drop to eye at bedtime.   Yes Historical Provider, MD  cetirizine (ZYRTEC) 10 MG tablet Take 10 mg by mouth daily.   Yes Historical Provider, MD  cycloSPORINE (RESTASIS) 0.05 % ophthalmic emulsion Place 1 drop into both eyes 2 (two) times daily.    Yes Historical Provider, MD  Evolocumab (REPATHA SURECLICK) [id number] MG/ML SOAJ Inject 1 pen into the skin every 14 (fourteen) days. 06/20/16  Yes Belva Crome, MD  flurazepam  [facility]) 30 MG capsule Take 30 mg by mouth at bedtime as needed for sleep.   Yes Historical Provider, MD  nitroGLYCERIN (NITROSTAT) 0.4 MG SL tablet Place 1 tablet (0.4 mg total) under the tongue every 5 (five) minutes as needed for chest pain. 06/06/16  Yes Imogene Burn, PA-C  triamcinolone (NASACORT) 55 MCG/ACT AERO nasal inhaler Place 2 sprays into the nose daily.   Yes Historical Provider, MD  triamcinolone cream (KENALOG) 0.1 % APPLY TO AFFECTED AREA ON THE SKIN TWICE DAILY AS NEEDED, FOR SKIN IRRITATION AS DIRECTED 11/16/15  Yes Historical Provider, MD  Urea 45 % CREA Apply 1 application topically daily. 09/14/15  Yes Trula Slade, DPM  ciprofloxacin (CIPRO) 500 MG tablet Take 1 tablet (500 mg total) by mouth every 12 (twelve) hours. 07/08/16 07/18/16  Gareth Morgan, MD  metroNIDAZOLE (FLAGYL) 500 MG tablet Take 1 tablet (500 mg total) by mouth 3 (three) times daily. 07/08/16 07/18/16  Gareth Morgan, MD  ondansetron (ZOFRAN ODT) 4 MG disintegrating tablet Take 1 tablet (4 mg total) by mouth every 8 (eight) hours as needed for nausea or vomiting. 07/08/16   Gareth Morgan, MD  traMADol (ULTRAM) 50 MG tablet Take 1 tablet (50 mg total) by mouth every 6 (six) hours as needed. 07/08/16   Gareth Morgan, MD    Family History Family History  Problem Relation Age of Onset  . Heart disease Mother   . Cancer Father   . Alcohol abuse Brother   . Kidney failure Brother     Social History Social History  Substance Use Topics  . Smoking status: Former Smoker    Quit date: 03/26/1969  . Smokeless tobacco: Never Used  . Alcohol use 0.0 oz/week     Comment: 2 per day     Allergies   Lipitor [atorvastatin]; Bystolic [nebivolol hcl]; Lexiscan [regadenoson]; Zetia [ezetimibe]; Neosporin [neomycin-bacitracin zn-polymyx]; Pravastatin; and Toprol xl [metoprolol tartrate]   Review of Systems Review of Systems  Constitutional: Positive for activity change, chills and fatigue. Negative for  fever.  HENT: Negative for sore throat.   Eyes: Negative for visual disturbance.  Respiratory: Negative for shortness of breath.   Cardiovascular: Negative for chest pain.  Gastrointestinal: Positive for abdominal pain, anal bleeding and blood in stool. Negative for diarrhea, nausea and vomiting.       No rectal pain   Genitourinary: Negative for difficulty urinating and dysuria.  Musculoskeletal: Negative for back pain and neck stiffness.  Skin: Negative for rash.  Neurological: Negative for syncope and headaches.     Physical Exam Updated Vital Signs BP 159/78 (BP Location: Left Arm)   Pulse 80   Temp 98.9 F (37.2 C) (Oral)   Resp 18   SpO2 99%   Physical Exam  Constitutional: He is oriented to person, place, and time. He appears well-developed and well-nourished. No distress.  HENT:  Head: Normocephalic and atraumatic.  Eyes: Conjunctivae and EOM are normal.  Neck: Normal range of motion.  Cardiovascular: Normal rate, regular rhythm, normal heart sounds and intact distal pulses.  Exam reveals no gallop and no friction rub.   No murmur heard. Pulmonary/Chest: Effort normal and breath sounds normal. No respiratory distress. He has no wheezes. He has no rales.  Abdominal: Soft. He exhibits no distension. There is tenderness. There is guarding (LLQ).  Genitourinary: Prostate normal. Rectal exam shows external hemorrhoid (1 small). Rectal exam shows anal tone normal. Guaiac stool: unable to obtain significant sample.  Genitourinary Comments: 46mm ulceration to left of rectum, no bleeding, no sign of fistula Verruca x1 on buttock      Musculoskeletal: He exhibits no edema.  Neurological: He is alert and oriented to person, place, and time.  Skin: Skin is warm and dry. He is not diaphoretic.  Nursing note and vitals reviewed.    ED Treatments / Results  Labs (all labs ordered are listed, but only abnormal results are displayed) Labs Reviewed  COMPREHENSIVE METABOLIC  PANEL - Abnormal; Notable for the following:       Result Value   Glucose, Bld 118 (*)    All other components within normal limits  CBC  URINALYSIS, ROUTINE W REFLEX MICROSCOPIC (NOT AT [facility])  POC OCCULT BLOOD, ED  TYPE AND SCREEN  ABO/RH    EKG  EKG Interpretation None       Radiology Ct Abdomen Pelvis W Contrast  Result Date: 07/08/2016 CLINICAL DATA:  Bilateral groin pain beginning this morning. Diarrhea and chills. EXAM: CT ABDOMEN AND PELVIS WITH CONTRAST TECHNIQUE: Multidetector CT imaging of the abdomen and pelvis was performed using the standard protocol following bolus administration of intravenous contrast. CONTRAST:  80 cc Isovue 370. COMPARISON:  None. FINDINGS: Mild dependent atelectasis is present in the lung bases. No pleural or pericardial effusion. Extensive calcific aortic and coronary atherosclerosis is identified. Heart size is normal. The gallbladder, liver, spleen, adrenal glands, pancreas and left kidney are unremarkable. Punctate hypoattenuating lesion in the lower pole of the right kidney on image 25 cannot be definitively characterized but is likely a cyst. Extensive aortoiliac atherosclerosis without aneurysm is identified. Sigmoid diverticulosis is seen with wall thickening and pericolonic stranding in the mid sigmoid colon consistent with acute diverticulitis. No abscess or perforation. Moderate stool burden throughout the remainder of the colon is noted. The appendix has been removed. The stomach and small bowel are unremarkable. No lymphadenopathy is identified. No focal bony abnormality is seen. Thoracic and lumbar spondylosis is noted. IMPRESSION: Sigmoid diverticulitis without abscess or perforation. Extensive calcific aortic and coronary atherosclerosis. Electronically Signed   By: Inge Rise M.D.   On: 07/08/2016 11:06   Procedures Procedures (including critical care time)  Medications Ordered in ED Medications  sodium chloride 0.9 % bolus 1,000  mL (0 mLs Intravenous Stopped 07/08/16 1416)  diatrizoate meglumine-sodium (GASTROGRAFIN) 66-10 % solution 30 mL (30 mLs Oral Given 07/08/16 0925)  iopamidol (ISOVUE-300) 61 % injection 100 mL (100 mLs Intravenous Contrast Given 07/08/16 1038)  piperacillin-tazobactam (ZOSYN) IVPB 3.375 g (0 g Intravenous Stopped 07/08/16 1416)     Initial Impression / Assessment and Plan / ED Course  I have reviewed the triage vital signs and the nursing notes.  Pertinent labs & imaging results that were available during my care of the patient were reviewed by  me and considered in my medical decision making (see chart for details).  Clinical Course   70 year old male with a history of coronary artery disease, hypertension, hyperlipidemia presents with concern for lower abdominal pain, and episodes of bright red rectal bleeding with mucus. Patient hemodynamically stable, with hemoglobin 14.8, and history not consistent with clinically significant gastrointestinal bleed. Rectal exam with no stool or blood, small hemorrhoid.  Patient with significant tenderness on abdominal exam and CT abdomen pelvis done to evaluate for diverticulitis.  CT shows uncomplicated diverticulitis. Pt given initial dose of zosyn in ED.  Pt appropriate for outpt treatment with strict return precautions. Given rx for flagyl and cipro, recommend ibuprofen/tylenol and gave tramadol for breakthrough pain. .Recommend close PCP follow up. Patient discharged in stable condition with understanding of reasons to return.    Final Clinical Impressions(s) / ED Diagnoses   Final diagnoses:  Diverticulitis of large intestine without perforation or abscess with bleeding    New Prescriptions Discharge Medication List as of 07/08/2016  2:12 PM    START taking these medications   Details  ciprofloxacin (CIPRO) 500 MG tablet Take 1 tablet (500 mg total) by mouth every 12 (twelve) hours., Starting Fri 07/08/2016, Until Mon 07/18/2016, Print      metroNIDAZOLE (FLAGYL) 500 MG tablet Take 1 tablet (500 mg total) by mouth 3 (three) times daily., Starting Fri 07/08/2016, Until Mon 07/18/2016, Print    ondansetron (ZOFRAN ODT) 4 MG disintegrating tablet Take 1 tablet (4 mg total) by mouth every 8 (eight) hours as needed for nausea or vomiting., Starting Fri 07/08/2016, Print    traMADol (ULTRAM) 50 MG tablet Take 1 tablet (50 mg total) by mouth every 6 (six) hours as needed., Starting Fri 07/08/2016, Print         Gareth Morgan, MD 07/09/16 1422

## 2016-07-08 NOTE — ED Triage Notes (Signed)
Pt c/o weakness and chills x 1 day and bright red rectal bleeding, bilateral groin pain, diarrhea starting this morning.  Denies pain.  Denies straining w/ BM.  Pt reports normal colonoscopy earlier this year.

## 2016-07-08 NOTE — ED Notes (Signed)
Pt sitting on the side of the stretcher eating.

## 2016-07-08 NOTE — ED Notes (Signed)
Pt reports last night he started to have bila groin pain, bright red mucus-like watery stool.  Pt states he only feels pressure in his groin area at this time, no pain.  Denies any abd pain or any urinary sxs at this time.

## 2016-07-08 NOTE — ED Notes (Addendum)
Pt ambulated to the BR with no assist and steady gait.  Finished drinking PO contrast and Waiting for CT

## 2016-07-14 DIAGNOSIS — K5733 Diverticulitis of large intestine without perforation or abscess with bleeding: Secondary | ICD-10-CM | POA: Diagnosis not present

## 2016-07-26 DIAGNOSIS — M25561 Pain in right knee: Secondary | ICD-10-CM | POA: Diagnosis not present

## 2016-08-08 ENCOUNTER — Other Ambulatory Visit: Payer: Medicare Other | Admitting: *Deleted

## 2016-08-08 DIAGNOSIS — E785 Hyperlipidemia, unspecified: Secondary | ICD-10-CM | POA: Diagnosis not present

## 2016-08-08 LAB — LIPID PANEL
Cholesterol: 129 mg/dL (ref 125–200)
HDL: 71 mg/dL (ref >=40)
LDL CALC: 42 mg/dL (ref <130)
TRIGLYCERIDES: 81 mg/dL (ref <150)
Total CHOL/HDL Ratio: 1.8 Ratio (ref <=5.0)
VLDL: 16 mg/dL (ref <30)

## 2016-08-08 LAB — HEPATIC FUNCTION PANEL
ALBUMIN: 4.1 g/dL (ref 3.6–5.1)
ALT: 19 U/L (ref 9–46)
AST: 18 U/L (ref 10–35)
Alkaline Phosphatase: 54 U/L (ref 40–115)
BILIRUBIN INDIRECT: 0.5 mg/dL (ref 0.2–1.2)
Bilirubin, Direct: 0.1 mg/dL (ref <=0.2)
TOTAL PROTEIN: 6.5 g/dL (ref 6.1–8.1)
Total Bilirubin: 0.6 mg/dL (ref 0.2–1.2)

## 2016-08-31 DIAGNOSIS — Z23 Encounter for immunization: Secondary | ICD-10-CM | POA: Diagnosis not present

## 2016-09-02 ENCOUNTER — Encounter (INDEPENDENT_AMBULATORY_CARE_PROVIDER_SITE_OTHER): Payer: Self-pay

## 2016-09-02 ENCOUNTER — Encounter: Payer: Self-pay | Admitting: Interventional Cardiology

## 2016-09-02 ENCOUNTER — Ambulatory Visit (INDEPENDENT_AMBULATORY_CARE_PROVIDER_SITE_OTHER): Payer: Medicare Other | Admitting: Interventional Cardiology

## 2016-09-02 VITALS — BP 132/70 | HR 81 | Ht 68.0 in | Wt 176.1 lb

## 2016-09-02 DIAGNOSIS — Z889 Allergy status to unspecified drugs, medicaments and biological substances status: Secondary | ICD-10-CM | POA: Diagnosis not present

## 2016-09-02 DIAGNOSIS — E785 Hyperlipidemia, unspecified: Secondary | ICD-10-CM | POA: Diagnosis not present

## 2016-09-02 DIAGNOSIS — I10 Essential (primary) hypertension: Secondary | ICD-10-CM

## 2016-09-02 DIAGNOSIS — I25812 Atherosclerosis of bypass graft of coronary artery of transplanted heart without angina pectoris: Secondary | ICD-10-CM

## 2016-09-02 DIAGNOSIS — Z789 Other specified health status: Secondary | ICD-10-CM | POA: Insufficient documentation

## 2016-09-02 MED ORDER — AMLODIPINE BESYLATE 10 MG PO TABS: 10.0000 mg | ORAL_TABLET | Freq: Every day | ORAL | 3 refills | Status: DC

## 2016-09-02 NOTE — Patient Instructions (Addendum)
Medication Instructions:  Your physician has recommended you make the following change in your medication:  INCREASE Amlodipine to 10mg  daily. An Rx has been sent to your pharmacy   Labwork: None ordered  Testing/Procedures: None ordered  Follow-Up: Your physician wants you to follow-up in: 1 year with Dr.Smith You will receive a reminder letter in the mail two months in advance. If you don't receive a letter, please call our office to schedule the follow-up appointment.   Any Other Special Instructions Will Be Listed Below (If Applicable).  Limit your sodium intake to 2 grams (2,000mg ) daily   Low-Sodium Eating Plan Sodium raises blood pressure and causes water to be held in the body. Getting less sodium from food will help lower your blood pressure, reduce any swelling, and protect your heart, liver, and kidneys. We get sodium by adding salt (sodium chloride) to food. Most of our sodium comes from canned, boxed, and frozen foods. Restaurant foods, fast foods, and pizza are also very high in sodium. Even if you take medicine to lower your blood pressure or to reduce fluid in your body, getting less sodium from your food is important. WHAT IS MY PLAN? Most people should limit their sodium intake to 2,300 mg a day. Your health care provider recommends that you limit your sodium intake to ____2 grams______ a day.  WHAT DO I NEED TO KNOW ABOUT THIS EATING PLAN? For the low-sodium eating plan, you will follow these general guidelines:  Choose foods with a % Daily Value for sodium of less than 5% (as listed on the food label).   Use salt-free seasonings or herbs instead of table salt or sea salt.   Check with your health care provider or pharmacist before using salt substitutes.   Eat fresh foods.  Eat more vegetables and fruits.  Limit canned vegetables. If you do use them, rinse them well to decrease the sodium.   Limit cheese to 1 oz (28 g) per day.   Eat lower-sodium  products, often labeled as "lower sodium" or "no salt added."  Avoid foods that contain monosodium glutamate (MSG). MSG is sometimes added to [place] food and some canned foods.  Check food labels (Nutrition Facts labels) on foods to learn how much sodium is in one serving.  Eat more home-cooked food and less restaurant, buffet, and fast food.  When eating at a restaurant, ask that your food be prepared with less salt, or no salt if possible.  HOW DO I READ FOOD LABELS FOR SODIUM INFORMATION? The Nutrition Facts label lists the amount of sodium in one serving of the food. If you eat more than one serving, you must multiply the listed amount of sodium by the number of servings. Food labels may also identify foods as:  Sodium free--Less than 5 mg in a serving.  Very low sodium--35 mg or less in a serving.  Low sodium--140 mg or less in a serving.  Light in sodium--50% less sodium in a serving. For example, if a food that usually has 300 mg of sodium is changed to become light in sodium, it will have 150 mg of sodium.  Reduced sodium--25% less sodium in a serving. For example, if a food that usually has 400 mg of sodium is changed to reduced sodium, it will have 300 mg of sodium. WHAT FOODS CAN I EAT? Grains Low-sodium cereals, including oats, puffed wheat and rice, and shredded wheat cereals. Low-sodium crackers. Unsalted rice and pasta. Lower-sodium bread.  Vegetables Frozen or fresh  vegetables. Low-sodium or reduced-sodium canned vegetables. Low-sodium or reduced-sodium tomato sauce and paste. Low-sodium or reduced-sodium tomato and vegetable juices.  Fruits Fresh, frozen, and canned fruit. Fruit juice.  Meat and Other Protein Products Low-sodium canned tuna and salmon. Fresh or frozen meat, poultry, seafood, and fish. Lamb. Unsalted nuts. Dried beans, peas, and lentils without added salt. Unsalted canned beans. Homemade soups without salt. Eggs.  Dairy Milk. Soy milk.  Ricotta cheese. Low-sodium or reduced-sodium cheeses. Yogurt.  Condiments Fresh and dried herbs and spices. Salt-free seasonings. Onion and garlic powders. Low-sodium varieties of mustard and ketchup. Fresh or refrigerated horseradish. Lemon juice.  Fats and Oils Reduced-sodium salad dressings. Unsalted butter.  Other Unsalted popcorn and pretzels.  The items listed above may not be a complete list of recommended foods or beverages. Contact your dietitian for more options. WHAT FOODS ARE NOT RECOMMENDED? Grains Instant hot cereals. Bread stuffing, pancake, and biscuit mixes. Croutons. Seasoned rice or pasta mixes. Noodle soup cups. Boxed or frozen macaroni and cheese. Self-rising flour. Regular salted crackers. Vegetables Regular canned vegetables. Regular canned tomato sauce and paste. Regular tomato and vegetable juices. Frozen vegetables in sauces. Salted [place] fries. Olives. Angie Fava. Relishes. Sauerkraut. Salsa. Meat and Other Protein Products Salted, canned, smoked, spiced, or pickled meats, seafood, or fish. Bacon, ham, sausage, hot dogs, corned beef, chipped beef, and packaged luncheon meats. Salt pork. Jerky. Pickled herring. Anchovies, regular canned tuna, and sardines. Salted nuts. Dairy Processed cheese and cheese spreads. Cheese curds. Blue cheese and cottage cheese. Buttermilk.  Condiments Onion and garlic salt, seasoned salt, table salt, and sea salt. Canned and packaged gravies. Worcestershire sauce. Tartar sauce. Barbecue sauce. Teriyaki sauce. Soy sauce, including reduced sodium. Steak sauce. Fish sauce. Oyster sauce. Cocktail sauce. Horseradish that you find on the shelf. Regular ketchup and mustard. Meat flavorings and tenderizers. Bouillon cubes. Hot sauce. Tabasco sauce. Marinades. Taco seasonings. Relishes. Fats and Oils Regular salad dressings. Salted butter. Margarine. Ghee. Bacon fat.  Other Potato and tortilla chips. Corn chips and puffs. Salted popcorn  and pretzels. Canned or dried soups. Pizza. Frozen entrees and pot pies.  The items listed above may not be a complete list of foods and beverages to avoid. Contact your dietitian for more information.   This information is not intended to replace advice given to you by your health care provider. Make sure you discuss any questions you have with your health care provider.   Document Released: 05/20/2002 Document Revised: 12/19/2014 Document Reviewed: 10/02/2013 Elsevier Interactive Patient Education [employer].      If you need a refill on your cardiac medications before your next appointment, please call your pharmacy.

## 2016-09-02 NOTE — Progress Notes (Signed)
Cardiology Office Note    Date:  09/02/2016   ID:  Lamell, Bohlander 1946/11/28, MRN [id number]  PCP:  Garlan Fair, MD  Cardiologist: Sinclair Grooms, MD   Chief Complaint  Patient presents with  . Coronary Artery Disease    History of Present Illness:  Nathan [name] is a 70 y.o. male who presents for CAD, prior CABG, hypertension, hyperlipidemia, and family history of CAD.Now having increasing difficulty with medication intolerances including beta blockers, statins, and he feels Lexiscan also cause chest pain.  No ischemic symptoms. Blood pressure adjustments have been made. Statins of been stopped. Ripatha has been started.  Not exercising because of right knee discomfort.  Consistently running blood pressures above XX123456 systolic at home. Amlodipine has been use now for greater than a month. Blood pressures today are elevated.   Past Medical History:  Diagnosis Date  . Arthritis   . Blood transfusion without reported diagnosis   . Complication of anesthesia    jittery after surgery  . Coronary artery disease   . GERD (gastroesophageal reflux disease)   . Hyperlipemia   . Hypertension   . Seasonal allergies   . Wears glasses     Past Surgical History:  Procedure Laterality Date  . APPENDECTOMY     age 46  . COLONOSCOPY    . COLONOSCOPY WITH PROPOFOL N/A 02/15/2016   Procedure: COLONOSCOPY WITH PROPOFOL;  Surgeon: Garlan Fair, MD;  Location: [facility];  Service: Endoscopy;  Laterality: N/A;  . CORONARY ARTERY BYPASS GRAFT     2003  . ESOPHAGOGASTRODUODENOSCOPY (EGD) WITH PROPOFOL N/A 02/15/2016   Procedure: ESOPHAGOGASTRODUODENOSCOPY (EGD) WITH PROPOFOL;  Surgeon: Garlan Fair, MD;  Location: [facility];  Service: Endoscopy;  Laterality: N/A;  . EXPLORATION POST OPERATIVE OPEN HEART    . EYE SURGERY     detached retina-lt  . INGUINAL HERNIA REPAIR Left 04/01/2014   Procedure: HERNIA REPAIR INGUINAL ADULT;  Surgeon: Shann Medal, MD;  Location:  [facility];  Service: General;  Laterality: Left;  . INSERTION OF MESH Left 04/01/2014   Procedure: INSERTION OF MESH;  Surgeon: Shann Medal, MD;  Location: [facility];  Service: General;  Laterality: Left;  . KNEE ARTHROCENTESIS  1980   right  . SHOULDER ACROMIOPLASTY  1985   rt  . SINUS ENDO W/FUSION    . TONSILLECTOMY AND ADENOIDECTOMY      Current Medications: Outpatient Medications Prior to Visit  Medication Sig Dispense Refill  . acetaminophen (TYLENOL) 500 MG tablet Take 1,000 mg by mouth every 6 (six) hours as needed for moderate pain or headache.    Marland Kitchen aspirin EC 81 MG tablet Take 81 mg by mouth daily.    . Carboxymethylcellulose Sodium 0.25 % SOLN Apply 1 drop to eye at bedtime.    . cetirizine (ZYRTEC) 10 MG tablet Take 10 mg by mouth daily.    . cycloSPORINE (RESTASIS) 0.05 % ophthalmic emulsion Place 1 drop into both eyes 2 (two) times daily.     . Evolocumab (REPATHA SURECLICK) XX123456 MG/ML SOAJ Inject 1 pen into the skin every 14 (fourteen) days. 2 pen 11  . flurazepam (DALMANE) 30 MG capsule Take 30 mg by mouth at bedtime as needed for sleep.    . nitroGLYCERIN (NITROSTAT) 0.4 MG SL tablet Place 1 tablet (0.4 mg total) under the tongue every 5 (five) minutes as needed for chest pain. 25 tablet prn  . triamcinolone (NASACORT) 55 MCG/ACT  AERO nasal inhaler Place 2 sprays into the nose daily.    Marland Kitchen triamcinolone cream (KENALOG) 0.1 % APPLY TO AFFECTED AREA ON THE SKIN TWICE DAILY AS NEEDED, FOR SKIN IRRITATION AS DIRECTED  0  . Urea 45 % CREA Apply 1 application topically daily. 225 g 2  . amLODipine (NORVASC) 5 MG tablet Take 1 tablet (5 mg total) by mouth daily. 30 tablet 11  . ondansetron (ZOFRAN ODT) 4 MG disintegrating tablet Take 1 tablet (4 mg total) by mouth every 8 (eight) hours as needed for nausea or vomiting. (Patient not taking: Reported on 09/02/2016) 20 tablet 0  . traMADol (ULTRAM) 50 MG tablet Take 1 tablet (50 mg total) by mouth  every 6 (six) hours as needed. (Patient not taking: Reported on 09/02/2016) 10 tablet 0   No facility-administered medications prior to visit.      Allergies:   Lipitor [atorvastatin]; Bystolic [nebivolol hcl]; Lexiscan [regadenoson]; Zetia [ezetimibe]; Neosporin [neomycin-bacitracin zn-polymyx]; Pravastatin; and Toprol xl [metoprolol tartrate]   Social History   Social History  . Marital status: Single    Spouse name: N/A  . Number of children: N/A  . Years of education: post grad   Occupational History  . [employer]    Social History Main Topics  . Smoking status: Former Smoker    Quit date: 03/26/1969  . Smokeless tobacco: Never Used  . Alcohol use 0.0 oz/week     Comment: 2 per day  . Drug use: No  . Sexual activity: Not Asked   Other Topics Concern  . None   Social History Narrative   Patient drinks 1-2 cups of caffeine daily.   Patient is left handed.      Family History:  The patient's family history includes Alcohol abuse in his brother; Cancer in his father; Heart disease in his mother; Kidney failure in his brother.   ROS:   Please see the history of present illness.    Depression, insomnia, multiple drug reactions.  All other systems reviewed and are negative.   PHYSICAL EXAM:   VS:  BP 132/70   Pulse 81   Ht 5\' 8"  (1.727 m)   Wt 176 lb 1.9 oz (79.9 kg)   BMI 26.78 kg/m    GEN: Well nourished, well developed, in no acute distress  HEENT: normal  Neck: no JVD, carotid bruits, or masses Cardiac: RRR; no murmurs, rubs, or gallops,no edema  Respiratory:  clear to auscultation bilaterally, normal work of breathing GI: soft, nontender, nondistended, + BS MS: no deformity or atrophy  Skin: warm and dry, no rash Neuro:  Alert and Oriented x 3, Strength and sensation are intact Psych: euthymic mood, full affect  Wt Readings from Last 3 Encounters:  09/02/16 176 lb 1.9 oz (79.9 kg)  06/06/16 174 lb 6.4 oz (79.1 kg)  01/04/16 177 lb (80.3 kg)       Studies/Labs Reviewed:   EKG:  EKG  Not performed due to daily. Most recent tracing from June 2017 reveals left atrial abnormality but otherwise unremarkable.  Recent Labs: 07/08/2016: BUN 14; Creatinine, Ser 0.68; Hemoglobin 14.8; Platelets 170; Potassium 4.2; Sodium 137 08/08/2016: ALT 19   Lipid Panel    Component Value Date/Time   CHOL 129 08/08/2016 0810   TRIG 81 08/08/2016 0810   HDL 71 08/08/2016 0810   CHOLHDL 1.8 08/08/2016 0810   VLDL 16 08/08/2016 0810   LDLCALC 42 08/08/2016 0810    Additional studies/ records that were reviewed today include:  Remarkable  reduction and lipids with her path.    ASSESSMENT:    1. 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 Essential hypertension   3. Hyperlipidemia   4. Medication intolerance      PLAN:  In order of problems listed above:  1. Asymptomatic. Aerobic activity. Lipid control. 2. BP target should be less than 140/90 mmHg. Increase amlodipine to 10 mg per day. Monitor for side effects. Call with blood pressure results. 3. Continue Rapatha. 4. Multiple developing intolerances sometimes years after medications use. Raises question of some under lying immediate initiate. If additional antihypertensive therapy as needed, low-dose diuretic and or angiotensin blockade would be a consideration.    Medication Adjustments/Labs and Tests Ordered: Current medicines are reviewed at length with the patient today.  Concerns regarding medicines are outlined above.  Medication changes, Labs and Tests ordered today are listed in the Patient Instructions below. Patient Instructions  Medication Instructions:  Your physician has recommended you make the following change in your medication:  INCREASE Amlodipine to 10mg  daily. An Rx has been sent to your pharmacy   Labwork: None ordered  Testing/Procedures: None ordered  Follow-Up: Your physician wants you to follow-up  in: 1 year with Dr.Vlada Uriostegui You will receive a reminder letter in the mail two months in advance. If you don't receive a letter, please call our office to schedule the follow-up appointment.   Any Other Special Instructions Will Be Listed Below (If Applicable).  Limit your sodium intake to 2 grams (2,000mg ) daily   Low-Sodium Eating Plan Sodium raises blood pressure and causes water to be held in the body. Getting less sodium from food will help lower your blood pressure, reduce any swelling, and protect your heart, liver, and kidneys. We get sodium by adding salt (sodium chloride) to food. Most of our sodium comes from canned, boxed, and frozen foods. Restaurant foods, fast foods, and pizza are also very high in sodium. Even if you take medicine to lower your blood pressure or to reduce fluid in your body, getting less sodium from your food is important. WHAT IS MY PLAN? Most people should limit their sodium intake to 2,300 mg a day. Your health care provider recommends that you limit your sodium intake to ____2 grams______ a day.  WHAT DO I NEED TO KNOW ABOUT THIS EATING PLAN? For the low-sodium eating plan, you will follow these general guidelines:  Choose foods with a % Daily Value for sodium of less than 5% (as listed on the food label).   Use salt-free seasonings or herbs instead of table salt or sea salt.   Check with your health care provider or pharmacist before using salt substitutes.   Eat fresh foods.  Eat more vegetables and fruits.  Limit canned vegetables. If you do use them, rinse them well to decrease the sodium.   Limit cheese to 1 oz (28 g) per day.   Eat lower-sodium products, often labeled as "lower sodium" or "no salt added."  Avoid foods that contain monosodium glutamate (MSG). MSG is sometimes added to [place] food and some canned foods.  Check food labels (Nutrition Facts labels) on foods to learn how much sodium is in one serving.  Eat more  home-cooked food and less restaurant, buffet, and fast food.  When eating at a restaurant, ask that your food be prepared with less salt, or no salt if possible.  HOW DO I READ FOOD LABELS FOR SODIUM INFORMATION? The Nutrition Facts  label lists the amount of sodium in one serving of the food. If you eat more than one serving, you must multiply the listed amount of sodium by the number of servings. Food labels may also identify foods as:  Sodium free--Less than 5 mg in a serving.  Very low sodium--35 mg or less in a serving.  Low sodium--140 mg or less in a serving.  Light in sodium--50% less sodium in a serving. For example, if a food that usually has 300 mg of sodium is changed to become light in sodium, it will have 150 mg of sodium.  Reduced sodium--25% less sodium in a serving. For example, if a food that usually has 400 mg of sodium is changed to reduced sodium, it will have 300 mg of sodium. WHAT FOODS CAN I EAT? Grains Low-sodium cereals, including oats, puffed wheat and rice, and shredded wheat cereals. Low-sodium crackers. Unsalted rice and pasta. Lower-sodium bread.  Vegetables Frozen or fresh vegetables. Low-sodium or reduced-sodium canned vegetables. Low-sodium or reduced-sodium tomato sauce and paste. Low-sodium or reduced-sodium tomato and vegetable juices.  Fruits Fresh, frozen, and canned fruit. Fruit juice.  Meat and Other Protein Products Low-sodium canned tuna and salmon. Fresh or frozen meat, poultry, seafood, and fish. Lamb. Unsalted nuts. Dried beans, peas, and lentils without added salt. Unsalted canned beans. Homemade soups without salt. Eggs.  Dairy Milk. Soy milk. Ricotta cheese. Low-sodium or reduced-sodium cheeses. Yogurt.  Condiments Fresh and dried herbs and spices. Salt-free seasonings. Onion and garlic powders. Low-sodium varieties of mustard and ketchup. Fresh or refrigerated horseradish. Lemon juice.  Fats and Oils Reduced-sodium salad  dressings. Unsalted butter.  Other Unsalted popcorn and pretzels.  The items listed above may not be a complete list of recommended foods or beverages. Contact your dietitian for more options. WHAT FOODS ARE NOT RECOMMENDED? Grains Instant hot cereals. Bread stuffing, pancake, and biscuit mixes. Croutons. Seasoned rice or pasta mixes. Noodle soup cups. Boxed or frozen macaroni and cheese. Self-rising flour. Regular salted crackers. Vegetables Regular canned vegetables. Regular canned tomato sauce and paste. Regular tomato and vegetable juices. Frozen vegetables in sauces. Salted [place] fries. Olives. Angie Fava. Relishes. Sauerkraut. Salsa. Meat and Other Protein Products Salted, canned, smoked, spiced, or pickled meats, seafood, or fish. Bacon, ham, sausage, hot dogs, corned beef, chipped beef, and packaged luncheon meats. Salt pork. Jerky. Pickled herring. Anchovies, regular canned tuna, and sardines. Salted nuts. Dairy Processed cheese and cheese spreads. Cheese curds. Blue cheese and cottage cheese. Buttermilk.  Condiments Onion and garlic salt, seasoned salt, table salt, and sea salt. Canned and packaged gravies. Worcestershire sauce. Tartar sauce. Barbecue sauce. Teriyaki sauce. Soy sauce, including reduced sodium. Steak sauce. Fish sauce. Oyster sauce. Cocktail sauce. Horseradish that you find on the shelf. Regular ketchup and mustard. Meat flavorings and tenderizers. Bouillon cubes. Hot sauce. Tabasco sauce. Marinades. Taco seasonings. Relishes. Fats and Oils Regular salad dressings. Salted butter. Margarine. Ghee. Bacon fat.  Other Potato and tortilla chips. Corn chips and puffs. Salted popcorn and pretzels. Canned or dried soups. Pizza. Frozen entrees and pot pies.  The items listed above may not be a complete list of foods and beverages to avoid. Contact your dietitian for more information.   This information is not intended to replace advice given to you by your health care  provider. Make sure you discuss any questions you have with your health care provider.   Document Released: 05/20/2002 Document Revised: 12/19/2014 Document Reviewed: 10/02/2013 Elsevier Interactive Patient Education [employer].  If you need a refill on your cardiac medications before your next appointment, please call your pharmacy.      Signed, Sinclair Grooms, MD  09/02/2016 8:49 AM    [facility] [address], [place], [place]  [address] Phone: [phone]; Fax: [phone]

## 2016-09-05 ENCOUNTER — Other Ambulatory Visit: Payer: Self-pay | Admitting: Pharmacist

## 2016-09-29 DIAGNOSIS — R262 Difficulty in walking, not elsewhere classified: Secondary | ICD-10-CM | POA: Diagnosis not present

## 2016-09-29 DIAGNOSIS — M1711 Unilateral primary osteoarthritis, right knee: Secondary | ICD-10-CM | POA: Diagnosis not present

## 2016-09-29 DIAGNOSIS — M25561 Pain in right knee: Secondary | ICD-10-CM | POA: Diagnosis not present

## 2016-10-03 ENCOUNTER — Ambulatory Visit (INDEPENDENT_AMBULATORY_CARE_PROVIDER_SITE_OTHER): Payer: Medicare Other | Admitting: Pharmacist

## 2016-10-03 VITALS — BP 138/72 | HR 76 | Ht 68.5 in | Wt 180.2 lb

## 2016-10-03 DIAGNOSIS — I25812 Atherosclerosis of bypass graft of coronary artery of transplanted heart without angina pectoris: Secondary | ICD-10-CM

## 2016-10-03 DIAGNOSIS — M25561 Pain in right knee: Secondary | ICD-10-CM | POA: Diagnosis not present

## 2016-10-03 DIAGNOSIS — I10 Essential (primary) hypertension: Secondary | ICD-10-CM | POA: Diagnosis not present

## 2016-10-03 DIAGNOSIS — M1711 Unilateral primary osteoarthritis, right knee: Secondary | ICD-10-CM | POA: Diagnosis not present

## 2016-10-03 DIAGNOSIS — R262 Difficulty in walking, not elsewhere classified: Secondary | ICD-10-CM | POA: Diagnosis not present

## 2016-10-03 MED ORDER — LISINOPRIL 10 MG PO TABS: 10.0000 mg | ORAL_TABLET | Freq: Every day | ORAL | 11 refills | Status: DC

## 2016-10-03 NOTE — Patient Instructions (Addendum)
Stop taking your amlodipine for 3 days and see if your stomach symptoms improve.  Start taking lisinopril in 3 days.  Recheck lab work and blood pressure 1 week after starting lisinopril - Thursday, November 9th at 8:15am.

## 2016-10-03 NOTE — Progress Notes (Signed)
Patient ID: Nathan [name]                 DOB: Apr 28, 1946                      MRN: [id number]     HPI: Nathan [name] is a 70 y.o. male patient of Dr Tamala Julian well known to HTN and lipid clinic. Patient has a PMH significant for CAD, prior CABG in 2005, HTN, and HLD. Pt called pharmacy clinic last week stating that his home BP readings have been elevated again. Hist last 2 office BP readings were previously at goal with pt taking amlodipine 10mg  daily.   Patient reports that home BP readings have been elevated since increasing amlodipine to 10mg  daily. He brings in his home BP cuff today and home readings range 130-150s/70-80s. His home cuff reading in clinic was 141/78, HR 75. Clinic reading on recheck was: 138/72. Home cuff readings tend to be accurate. Pt states his BP readings have increased since his amlodipine dose was increased. Patient does report some GI upset which he has had with other medications previously. He would like to only take 1 medication for his BP if possible.  Pt was also started on Repatha for his cholesterol in July 2017. LDL has responded well and was most recently 42mg /dL.   Current HTN meds: amlodipine 10mg  daily Previously tried: metoprolol and nebivolol (rash) BP goal: <140/110mmHg  Current Lipid Meds: Repatha - LDL now at goal Intolerances: atorvastatin 10mg  daily, low dose pravastatin and simvastatin (rash), low dose rosuvastatin (bad taste and vivid dreams), Zetia 5mg  and 10mg  daily - bloating, cramping, and diarrhea Risk Factors: CAD, s/p CABG Goals: LDL 70mg /dL  Exercise: Treadmill for 40 mins or walks 3-4 miles daily.  Diet: Eats a lot of salmon and seafood. Cooks at home and rarely eats out. Likes granola with fruit. Snacks on fruit.  Family History: No family history of heart disease or stroke.  Social History: The patient reports that he quit smoking about 46 years ago. He does not have any smokeless tobacco history on file. He reports that he drinks  alcohol. He reports that he does not use illicit drugs.   Wt Readings from Last 3 Encounters:  09/02/16 176 lb 1.9 oz (79.9 kg)  06/06/16 174 lb 6.4 oz (79.1 kg)  01/04/16 177 lb (80.3 kg)   BP Readings from Last 3 Encounters:  09/02/16 132/70  07/08/16 159/78  06/23/16 134/62   Pulse Readings from Last 3 Encounters:  09/02/16 81  07/08/16 80  06/23/16 81    Renal function: CrCl cannot be calculated (Unknown ideal weight.).  Past Medical History:  Diagnosis Date  . Arthritis   . Blood transfusion without reported diagnosis   . Complication of anesthesia    jittery after surgery  . Coronary artery disease   . GERD (gastroesophageal reflux disease)   . Hyperlipemia   . Hypertension   . Seasonal allergies   . Wears glasses     Current Outpatient Prescriptions on File Prior to Visit  Medication Sig Dispense Refill  . acetaminophen (TYLENOL) 500 MG tablet Take 1,000 mg by mouth every 6 (six) hours as needed for moderate pain or headache.    Marland Kitchen amLODipine (NORVASC) 10 MG tablet Take 1 tablet (10 mg total) by mouth daily. 90 tablet 3  . aspirin EC 81 MG tablet Take 81 mg by mouth daily.    . Carboxymethylcell-Hypromellose (GENTEAL) 0.25-0.3 % GEL Place 1  drop into both eyes daily.    . Carboxymethylcellulose Sodium 0.25 % SOLN Apply 1 drop to eye at bedtime.    . cetirizine (ZYRTEC) 10 MG tablet Take 10 mg by mouth daily.    . cycloSPORINE (RESTASIS) 0.05 % ophthalmic emulsion Place 1 drop into both eyes 2 (two) times daily.     . Evolocumab (REPATHA SURECLICK) XX123456 MG/ML SOAJ Inject 1 pen into the skin every 14 (fourteen) days. 2 pen 11  . flurazepam (DALMANE) 30 MG capsule Take 30 mg by mouth at bedtime as needed for sleep.    . nitroGLYCERIN (NITROSTAT) 0.4 MG SL tablet Place 1 tablet (0.4 mg total) under the tongue every 5 (five) minutes as needed for chest pain. 25 tablet prn  . triamcinolone (NASACORT) 55 MCG/ACT AERO nasal inhaler Place 2 sprays into the nose daily.    Marland Kitchen  triamcinolone cream (KENALOG) 0.1 % APPLY TO AFFECTED AREA ON THE SKIN TWICE DAILY AS NEEDED, FOR SKIN IRRITATION AS DIRECTED  0  . Urea 45 % CREA Apply 1 application topically daily. 225 g 2   No current facility-administered medications on file prior to visit.     Allergies  Allergen Reactions  . Lipitor [Atorvastatin] Rash  . Bystolic [Nebivolol Hcl] Rash  . Lexiscan [Regadenoson] Other (See Comments)    Sharp sternal pain  . Zetia [Ezetimibe] Diarrhea    GI effects - bloating, cramping, diarrhea on 5mg  and 10mg  dose  . Neosporin [Neomycin-Bacitracin Zn-Polymyx] Rash  . Pravastatin Rash  . Toprol Xl [Metoprolol Tartrate] Rash     Assessment/Plan:  1. Hypertension - BP at goal <140/32mmHg in clinic, however most of home readings are above goal. Pt would like to only take 1 medication for his blood pressure if possible. He has been having some GI upset that he attributes to amlodipine. He will stop taking amlodipine for 3 days to see if this resolves his GI upset. If not, he will resume amlodipine. Will also plan to start lisinopril 10mg  in 3 days after amlodipine washout. Will follow up with BMET and BP check 10 days after pt starts lisinopril.  Pt signed informed consent for [facility] lipid registry.  Megan E. Supple, PharmD, [place] [id number], [place], [place] [address] Phone: [phone]; Fax: [phone] 10/03/2016 10:15 AM

## 2016-10-06 DIAGNOSIS — R262 Difficulty in walking, not elsewhere classified: Secondary | ICD-10-CM | POA: Diagnosis not present

## 2016-10-06 DIAGNOSIS — M1711 Unilateral primary osteoarthritis, right knee: Secondary | ICD-10-CM | POA: Diagnosis not present

## 2016-10-06 DIAGNOSIS — M25561 Pain in right knee: Secondary | ICD-10-CM | POA: Diagnosis not present

## 2016-10-10 DIAGNOSIS — M25561 Pain in right knee: Secondary | ICD-10-CM | POA: Diagnosis not present

## 2016-10-10 DIAGNOSIS — R262 Difficulty in walking, not elsewhere classified: Secondary | ICD-10-CM | POA: Diagnosis not present

## 2016-10-10 DIAGNOSIS — M1711 Unilateral primary osteoarthritis, right knee: Secondary | ICD-10-CM | POA: Diagnosis not present

## 2016-10-17 ENCOUNTER — Encounter: Payer: Self-pay | Admitting: *Deleted

## 2016-10-17 DIAGNOSIS — M25561 Pain in right knee: Secondary | ICD-10-CM | POA: Diagnosis not present

## 2016-10-17 DIAGNOSIS — R262 Difficulty in walking, not elsewhere classified: Secondary | ICD-10-CM | POA: Diagnosis not present

## 2016-10-17 DIAGNOSIS — M1711 Unilateral primary osteoarthritis, right knee: Secondary | ICD-10-CM | POA: Diagnosis not present

## 2016-10-17 DIAGNOSIS — Z006 Encounter for examination for normal comparison and control in clinical research program: Secondary | ICD-10-CM

## 2016-10-17 NOTE — Progress Notes (Signed)
Late entry:  Subject met inclusion and exclusion criteria. The informed consent form, study requirements and expectations were reviewed with the subject and questions and concerns were addressed prior to the signing of the consent form. The subject verbalized understanding of the trail requirements. The subject agreed to participate in the [place] Registryand signed the informed consent. The informed consent was obtained prior to performance of any protocol-specific procedures for the subject. A copy of the signed informed consent was given to the subject and a copy was placed in the subject's medical record.  Jake Bathe, RN 10/03/2016

## 2016-10-19 NOTE — Progress Notes (Signed)
Patient ID: KEAGIN ARNTZEN                 DOB: Dec 21, 1945                      MRN: [id number]     HPI: Nathan [name] is a 70 y.o. male patient of Dr Tamala Julian well known to HTN and lipid clinic. Patient has a PMH significant for CAD, prior CABG in 2005, HTN, and HLD. Pt has multiple medication intolerances. Most recently, he discontinued amlodipine due to GI upset and started lisinopril 10mg  daily. He presents today for BP follow up.  Pt is not tolerating lisinopril any better than amlodipine. He noticed GI upset, bloating, and constipation a day or two after starting it. He also states that his BP readings did not improve at all when he took it. Most home BP readings ranged 140-150/80s. Occasional reading 120-130. Pt is concerned that his readings are fluctuating when he has been consistent with BP checking method at home. Home cuff readings are accurate. Clinic reading elevated today - pt did not take his lisinopril this morning.   Current HTN meds: lisinopril 10mg  daily Previously tried: metoprolol and nebivolol (rash), amlodipine - bloating, GI upset and diarrhea, lisinopril - bloating, GI upset and constipation BP goal: <140/102mmHg  Exercise: Treadmill for 40 mins or walks 3-4 miles daily.  Diet: Eats a lot of salmon and seafood. Cooks at home and rarely eats out. Snacks on granola with fruit.  Family History: No family history of heart disease or stroke.  Social History: The patient reports that he quit smoking about 46 years ago. He does not have any smokeless tobacco history on file. He reports that he drinks alcohol. He reports that he does not use illicit drugs.   Wt Readings from Last 3 Encounters:  10/03/16 180 lb 4 oz (81.8 kg)  09/02/16 176 lb 1.9 oz (79.9 kg)  06/06/16 174 lb 6.4 oz (79.1 kg)   BP Readings from Last 3 Encounters:  10/03/16 138/72  09/02/16 132/70  07/08/16 159/78   Pulse Readings from Last 3 Encounters:  10/03/16 76  09/02/16 81  07/08/16 80     Renal function: CrCl cannot be calculated (Patient's most recent lab result is older than the maximum 21 days allowed.).  Past Medical History:  Diagnosis Date  . Arthritis   . Blood transfusion without reported diagnosis   . Complication of anesthesia    jittery after surgery  . Coronary artery disease   . GERD (gastroesophageal reflux disease)   . Hyperlipemia   . Hypertension   . Seasonal allergies   . Wears glasses     Current Outpatient Prescriptions on File Prior to Visit  Medication Sig Dispense Refill  . acetaminophen (TYLENOL) 500 MG tablet Take 1,000 mg by mouth every 6 (six) hours as needed for moderate pain or headache.    Marland Kitchen amLODipine (NORVASC) 10 MG tablet Take 1 tablet (10 mg total) by mouth daily. 90 tablet 3  . aspirin EC 81 MG tablet Take 81 mg by mouth daily.    . Carboxymethylcell-Hypromellose (GENTEAL) 0.25-0.3 % GEL Place 1 drop into both eyes daily.    . Carboxymethylcellulose Sodium 0.25 % SOLN Apply 1 drop to eye at bedtime.    . cetirizine (ZYRTEC) 10 MG tablet Take 10 mg by mouth daily.    . cycloSPORINE (RESTASIS) 0.05 % ophthalmic emulsion Place 1 drop into both eyes 2 (two) times daily.     Marland Kitchen  Evolocumab (REPATHA SURECLICK) [id number] MG/ML SOAJ Inject 1 pen into the skin every 14 (fourteen) days. 2 pen 11  . flurazepam (DALMANE) 30 MG capsule Take 30 mg by mouth at bedtime as needed for sleep.    Marland Kitchen lisinopril (PRINIVIL,ZESTRIL) 10 MG tablet Take 1 tablet (10 mg total) by mouth daily. 30 tablet 11  . nitroGLYCERIN (NITROSTAT) 0.4 MG SL tablet Place 1 tablet (0.4 mg total) under the tongue every 5 (five) minutes as needed for chest pain. 25 tablet prn  . triamcinolone (NASACORT) 55 MCG/ACT AERO nasal inhaler Place 2 sprays into the nose daily.    Marland Kitchen triamcinolone cream (KENALOG) 0.1 % APPLY TO AFFECTED AREA ON THE SKIN TWICE DAILY AS NEEDED, FOR SKIN IRRITATION AS DIRECTED  0  . Urea 45 % CREA Apply 1 application topically daily. 225 g 2   No current  facility-administered medications on file prior to visit.     Allergies  Allergen Reactions  . Lipitor [Atorvastatin] Rash  . Bystolic [Nebivolol Hcl] Rash  . Lexiscan [Regadenoson] Other (See Comments)    Sharp sternal pain  . Zetia [Ezetimibe] Diarrhea    GI effects - bloating, cramping, diarrhea on 5mg  and 10mg  dose  . Neosporin [Neomycin-Bacitracin Zn-Polymyx] Rash  . Pravastatin Rash  . Toprol Xl [Metoprolol Tartrate] Rash     Assessment/Plan:  1. Hypertension - BP above goal <140/57mmHg and pt now with GI intolerance to lisinopril. Pt will stop his lisinopril for a few days to see if symptoms improve. If they do, he will try taking lisinopril 5mg  AM and PM. Hopeful that splitting up his dose will help make his BP more consistent and have better GI tolerability for pt. Checking BMET today with addition of lisinopril. Pt will call with update on medication intolerance and will schedule next visit based on this.   Megan E. Supple, PharmD, [place] [id number] [place], [place] [address] Phone: [phone]; Fax: [phone] 10/20/2016 8:41 AM

## 2016-10-20 ENCOUNTER — Ambulatory Visit (INDEPENDENT_AMBULATORY_CARE_PROVIDER_SITE_OTHER): Payer: Medicare Other | Admitting: Pharmacist

## 2016-10-20 ENCOUNTER — Encounter (INDEPENDENT_AMBULATORY_CARE_PROVIDER_SITE_OTHER): Payer: Self-pay

## 2016-10-20 ENCOUNTER — Other Ambulatory Visit: Payer: Medicare Other | Admitting: *Deleted

## 2016-10-20 ENCOUNTER — Encounter: Payer: Self-pay | Admitting: Pharmacist

## 2016-10-20 VITALS — BP 153/74 | HR 74

## 2016-10-20 DIAGNOSIS — M25561 Pain in right knee: Secondary | ICD-10-CM | POA: Diagnosis not present

## 2016-10-20 DIAGNOSIS — I10 Essential (primary) hypertension: Secondary | ICD-10-CM

## 2016-10-20 DIAGNOSIS — I25812 Atherosclerosis of bypass graft of coronary artery of transplanted heart without angina pectoris: Secondary | ICD-10-CM

## 2016-10-20 DIAGNOSIS — M1711 Unilateral primary osteoarthritis, right knee: Secondary | ICD-10-CM | POA: Diagnosis not present

## 2016-10-20 DIAGNOSIS — R262 Difficulty in walking, not elsewhere classified: Secondary | ICD-10-CM | POA: Diagnosis not present

## 2016-10-20 LAB — BASIC METABOLIC PANEL
BUN: 14 mg/dL (ref 7–25)
CHLORIDE: 106 mmol/L (ref 98–110)
CO2: 26 mmol/L (ref 20–31)
Calcium: 8.8 mg/dL (ref 8.6–10.3)
Creat: 0.75 mg/dL (ref 0.70–1.18)
Glucose, Bld: 109 mg/dL — ABNORMAL HIGH (ref 65–99)
POTASSIUM: 4.2 mmol/L (ref 3.5–5.3)
SODIUM: 139 mmol/L (ref 135–146)

## 2016-10-20 NOTE — Patient Instructions (Signed)
Stop taking your lisinopril for a few days to see if your GI symptoms resolve.  Try cutting your lisinopril in half and taking 1/2 in the morning and 1/2 in the evening.  Call me with updates [phone].

## 2016-10-24 DIAGNOSIS — M1711 Unilateral primary osteoarthritis, right knee: Secondary | ICD-10-CM | POA: Diagnosis not present

## 2016-10-24 DIAGNOSIS — M25561 Pain in right knee: Secondary | ICD-10-CM | POA: Diagnosis not present

## 2016-10-24 DIAGNOSIS — R262 Difficulty in walking, not elsewhere classified: Secondary | ICD-10-CM | POA: Diagnosis not present

## 2016-11-09 DIAGNOSIS — D2272 Melanocytic nevi of left lower limb, including hip: Secondary | ICD-10-CM | POA: Diagnosis not present

## 2016-11-09 DIAGNOSIS — D485 Neoplasm of uncertain behavior of skin: Secondary | ICD-10-CM | POA: Diagnosis not present

## 2016-11-09 DIAGNOSIS — D224 Melanocytic nevi of scalp and neck: Secondary | ICD-10-CM | POA: Diagnosis not present

## 2016-11-09 DIAGNOSIS — L821 Other seborrheic keratosis: Secondary | ICD-10-CM | POA: Diagnosis not present

## 2016-11-09 DIAGNOSIS — D2239 Melanocytic nevi of other parts of face: Secondary | ICD-10-CM | POA: Diagnosis not present

## 2016-11-09 DIAGNOSIS — L80 Vitiligo: Secondary | ICD-10-CM | POA: Diagnosis not present

## 2016-11-09 DIAGNOSIS — D2271 Melanocytic nevi of right lower limb, including hip: Secondary | ICD-10-CM | POA: Diagnosis not present

## 2016-11-09 DIAGNOSIS — D2261 Melanocytic nevi of right upper limb, including shoulder: Secondary | ICD-10-CM | POA: Diagnosis not present

## 2016-11-09 DIAGNOSIS — D2262 Melanocytic nevi of left upper limb, including shoulder: Secondary | ICD-10-CM | POA: Diagnosis not present

## 2016-11-09 DIAGNOSIS — L570 Actinic keratosis: Secondary | ICD-10-CM | POA: Diagnosis not present

## 2016-11-09 DIAGNOSIS — D1801 Hemangioma of skin and subcutaneous tissue: Secondary | ICD-10-CM | POA: Diagnosis not present

## 2016-11-09 DIAGNOSIS — Z85828 Personal history of other malignant neoplasm of skin: Secondary | ICD-10-CM | POA: Diagnosis not present

## 2016-11-09 DIAGNOSIS — D225 Melanocytic nevi of trunk: Secondary | ICD-10-CM | POA: Diagnosis not present

## 2016-11-10 DIAGNOSIS — M25561 Pain in right knee: Secondary | ICD-10-CM | POA: Diagnosis not present

## 2016-11-10 DIAGNOSIS — M1711 Unilateral primary osteoarthritis, right knee: Secondary | ICD-10-CM | POA: Diagnosis not present

## 2016-11-10 DIAGNOSIS — R262 Difficulty in walking, not elsewhere classified: Secondary | ICD-10-CM | POA: Diagnosis not present

## 2016-12-13 DIAGNOSIS — L988 Other specified disorders of the skin and subcutaneous tissue: Secondary | ICD-10-CM | POA: Diagnosis not present

## 2016-12-13 DIAGNOSIS — Z85828 Personal history of other malignant neoplasm of skin: Secondary | ICD-10-CM | POA: Diagnosis not present

## 2016-12-13 DIAGNOSIS — J0110 Acute frontal sinusitis, unspecified: Secondary | ICD-10-CM | POA: Diagnosis not present

## 2016-12-13 DIAGNOSIS — D485 Neoplasm of uncertain behavior of skin: Secondary | ICD-10-CM | POA: Diagnosis not present

## 2016-12-21 NOTE — Telephone Encounter (Signed)
I called the patient. His right arm pain went away shortly after being seen in January 2017, but it has returned around Thanksgiving of 2017. The pain seems to be getting worse associated with some numbness.  I will get a EMG and nerve conduction study set up.

## 2016-12-21 NOTE — Telephone Encounter (Signed)
Pt called to advise he began having tingling, decreased strength and muscle weakness in the right shoulder area that started again at Thanksgiving. Sts he unable to sleep on the right side. Pt is wanting to have NCV/EMG. He was last seen 01/04/16. Is it ok to schedule test with a f.u appt with Dr Viona Gilmore?  Please call him at [phone]

## 2016-12-21 NOTE — Telephone Encounter (Signed)
Pt had OV 01/04/16 for R arm pain/numbness. Symptoms resolved and previously ordered NCV/EMG was cancelled. Tingling and weakness has now returned in R upper extremity.

## 2016-12-21 NOTE — Addendum Note (Signed)
Addended by: Margette Fast on: 12/21/2016 09:36 AM   Modules accepted: Orders

## 2016-12-22 DIAGNOSIS — Z4802 Encounter for removal of sutures: Secondary | ICD-10-CM | POA: Diagnosis not present

## 2017-01-11 DIAGNOSIS — I10 Essential (primary) hypertension: Secondary | ICD-10-CM | POA: Diagnosis not present

## 2017-01-11 DIAGNOSIS — Z0001 Encounter for general adult medical examination with abnormal findings: Secondary | ICD-10-CM | POA: Diagnosis not present

## 2017-01-11 DIAGNOSIS — I2510 Atherosclerotic heart disease of native coronary artery without angina pectoris: Secondary | ICD-10-CM | POA: Diagnosis not present

## 2017-01-11 DIAGNOSIS — E7800 Pure hypercholesterolemia, unspecified: Secondary | ICD-10-CM | POA: Diagnosis not present

## 2017-01-11 DIAGNOSIS — Z86010 Personal history of colonic polyps: Secondary | ICD-10-CM | POA: Diagnosis not present

## 2017-01-11 DIAGNOSIS — R1032 Left lower quadrant pain: Secondary | ICD-10-CM | POA: Diagnosis not present

## 2017-01-11 DIAGNOSIS — Z125 Encounter for screening for malignant neoplasm of prostate: Secondary | ICD-10-CM | POA: Diagnosis not present

## 2017-02-08 ENCOUNTER — Ambulatory Visit (INDEPENDENT_AMBULATORY_CARE_PROVIDER_SITE_OTHER): Payer: Medicare Other | Admitting: Neurology

## 2017-02-08 ENCOUNTER — Encounter: Payer: Self-pay | Admitting: Neurology

## 2017-02-08 ENCOUNTER — Ambulatory Visit (INDEPENDENT_AMBULATORY_CARE_PROVIDER_SITE_OTHER): Payer: Self-pay | Admitting: Neurology

## 2017-02-08 DIAGNOSIS — G5601 Carpal tunnel syndrome, right upper limb: Secondary | ICD-10-CM

## 2017-02-08 DIAGNOSIS — R202 Paresthesia of skin: Secondary | ICD-10-CM

## 2017-02-08 DIAGNOSIS — M79609 Pain in unspecified limb: Secondary | ICD-10-CM

## 2017-02-08 DIAGNOSIS — M79601 Pain in right arm: Secondary | ICD-10-CM | POA: Diagnosis not present

## 2017-02-08 HISTORY — DX: Carpal tunnel syndrome, right upper limb: G56.01

## 2017-02-08 NOTE — Procedures (Signed)
     HISTORY:  Nathan [name] is a 71 year old gentleman with a history of prior cervical spine surgery and right shoulder surgery who reports numbness and discomfort down the right arm and shoulder that has worsened over the last several weeks. The patient has also begun to have some discomfort down the left leg. He returns for EMG and nerve conduction study evaluation.  NERVE CONDUCTION STUDIES:  Nerve conduction studies were performed on both upper extremities. The distal motor latencies for the median nerves were prolonged on the right, normal on the left, and normal for the ulnar nerves bilaterally. The nerve conduction velocities for these nerves were normal bilaterally. The F wave latencies for the median nerves and ulnar nerves were normal bilaterally. The sensory latencies for the left median nerve was slightly prolonged, but was unobtainable on the right. The sensory latencies for the ulnar nerves were normal bilaterally.   EMG STUDIES:  EMG study was performed on the right upper extremity:  The first dorsal interosseous muscle reveals 2 to 4 K units with full recruitment. No fibrillations or positive waves were noted. The abductor pollicis brevis muscle reveals 2 to 4 K units with full recruitment. No fibrillations or positive waves were noted. The extensor indicis proprius muscle reveals 1 to 3 K units with full recruitment. No fibrillations or positive waves were noted. The pronator teres muscle reveals 2 to 3 K units with full recruitment. No fibrillations or positive waves were noted. The biceps muscle reveals 1 to 2 K units with full recruitment. No fibrillations or positive waves were noted. The triceps muscle reveals 2 to 4 K units with full recruitment. No fibrillations or positive waves were noted. The anterior deltoid muscle reveals 2 to 3 K units with full recruitment. No fibrillations or positive waves were noted. The cervical paraspinal muscles were tested at 2 levels. No  abnormalities of insertional activity were seen at either level tested. There was good relaxation.   IMPRESSION:  Nerve conduction studies done on both upper extremities shows evidence of a mild right carpal tunnel syndrome, evidence of a borderline left carpal tunnel syndrome. EMG evaluation of the right upper extremity is within normal limits. There is no evidence of an overlying cervical radiculopathy.  Jill Alexanders MD 02/08/2017 12:32 PM  [facility] [address] [place], [place] [address]  Phone [phone] Fax [phone]

## 2017-02-08 NOTE — Progress Notes (Signed)
The patient comes into the office today for EMG and nerve conduction study evaluation. The study shows evidence of a mild right carpal tunnel syndrome, there is no evidence of an overlying right cervical radiculopathy.  The patient is now complaining of some discomfort with extension of the left thumb consistent with a tendinitis. He will be given wrist splints for both hands, if he is gaining no benefit after 6-8 weeks, he will call our office and we will consider a referral to a hand surgeon.  The patient is noting some right shoulder discomfort with certain movements of the right arm that would suggest that some of his discomfort is coming from intrinsic right shoulder disease. He may contact his orthopedic surgeon regarding this.  The patient has also noted recently small cell discomfort in the posterior aspect of the left leg to behind the left knee with some paresthesias as well. He denies any significant low back pain. This may require further evaluation in the future.

## 2017-02-08 NOTE — Progress Notes (Signed)
Please refer to EMG and nerve conduction study procedure note. 

## 2017-02-28 ENCOUNTER — Ambulatory Visit (INDEPENDENT_AMBULATORY_CARE_PROVIDER_SITE_OTHER): Payer: Medicare Other | Admitting: Pharmacist

## 2017-02-28 VITALS — BP 144/76

## 2017-02-28 DIAGNOSIS — I10 Essential (primary) hypertension: Secondary | ICD-10-CM

## 2017-02-28 NOTE — Progress Notes (Signed)
Patient ID: Nathan [name]                 DOB: [birth year]                      MRN: [id number]     HPI: Nathan [name] is a 71 y.o. male patient of Dr Tamala Julian well known to pharmacy clinic. PMH is significant for CAD, prior CABG in 2005, HTN, and HLD. Pt called clinic yesterday with request for an appt to help with insurance coverage for home BP cuff.   Pt required BP reading and bicep measurement for free cuff from his insurance company. Pt is left handed - right bicep circumference 12 inches. BP 144/76 on no therapy (pt with many medication intolerances). Of note, pt has tendonitis in his left hand and carpal tunnel in his right hand. He wonders if this is related to elevated BP since he read that it could be a cause. However this is unlikely since his baseline systolic BP is consistently ~140. He is going [place] a [place] next month.  Current HTN meds: none Previously tried: metoprolol and nebivolol (rash), amlodipine - bloating, GI upset and diarrhea, lisinopril - bloating, GI upset and constipation, lisinopril 10mg  daily and 5mg  BID - GI ussues BP goal: <140/49mmHg   Family History: No history of heart disease or stroke.  Social History: Pt quit smoking 46 years ago. Drinks alcohol and denies illicit drug use.  Diet: Eats a lot of salmon and seafood. Cooks at home and rarely eats out. Snacks on granola with fruit.  Exercise: Treadmill for 40 minutes or walks 3-4 miles each day.   Wt Readings from Last 3 Encounters:  10/03/16 180 lb 4 oz (81.8 kg)  09/02/16 176 lb 1.9 oz (79.9 kg)  06/06/16 174 lb 6.4 oz (79.1 kg)   BP Readings from Last 3 Encounters:  10/20/16 (!) 153/74  10/03/16 138/72  09/02/16 132/70   Pulse Readings from Last 3 Encounters:  10/20/16 74  10/03/16 76  09/02/16 81    Renal function: CrCl cannot be calculated (Patient's most recent lab result is older than the maximum 21 days allowed.).  Past Medical History:  Diagnosis Date  . Arthritis   .  Blood transfusion without reported diagnosis   . Complication of anesthesia    jittery after surgery  . Coronary artery disease   . GERD (gastroesophageal reflux disease)   . Hyperlipemia   . Hypertension   . Right carpal tunnel syndrome 02/08/2017  . Seasonal allergies   . Wears glasses     Current Outpatient Prescriptions on File Prior to Visit  Medication Sig Dispense Refill  . acetaminophen (TYLENOL) 500 MG tablet Take 1,000 mg by mouth every 6 (six) hours as needed for moderate pain or headache.    Marland Kitchen aspirin EC 81 MG tablet Take 81 mg by mouth daily.    . Carboxymethylcell-Hypromellose (GENTEAL) 0.25-0.3 % GEL Place 1 drop into both eyes daily.    . Carboxymethylcellulose Sodium 0.25 % SOLN Apply 1 drop to eye at bedtime.    . cetirizine (ZYRTEC) 10 MG tablet Take 10 mg by mouth daily.    . cycloSPORINE (RESTASIS) 0.05 % ophthalmic emulsion Place 1 drop into both eyes 2 (two) times daily.     . Evolocumab (REPATHA SURECLICK) 009 MG/ML SOAJ Inject 1 pen into the skin every 14 (fourteen) days. 2 pen 11  . flurazepam (DALMANE) 30 MG capsule Take 30 mg by  mouth at bedtime as needed for sleep.    Marland Kitchen lisinopril (PRINIVIL,ZESTRIL) 10 MG tablet Take 1 tablet (10 mg total) by mouth daily. 30 tablet 11  . nitroGLYCERIN (NITROSTAT) 0.4 MG SL tablet Place 1 tablet (0.4 mg total) under the tongue every 5 (five) minutes as needed for chest pain. 25 tablet prn  . triamcinolone (NASACORT) 55 MCG/ACT AERO nasal inhaler Place 2 sprays into the nose daily.    Marland Kitchen triamcinolone cream (KENALOG) 0.1 % APPLY TO AFFECTED AREA ON THE SKIN TWICE DAILY AS NEEDED, FOR SKIN IRRITATION AS DIRECTED  0  . Urea 45 % CREA Apply 1 application topically daily. 225 g 2   No current facility-administered medications on file prior to visit.     Allergies  Allergen Reactions  . Lipitor [Atorvastatin] Rash  . Bystolic [Nebivolol Hcl] Rash  . Lexiscan [Regadenoson] Other (See Comments)    Sharp sternal pain  . Zetia  [Ezetimibe] Diarrhea    GI effects - bloating, cramping, diarrhea on 5mg  and 10mg  dose  . Neosporin [Neomycin-Bacitracin Zn-Polymyx] Rash  . Pravastatin Rash  . Toprol Xl [Metoprolol Tartrate] Rash     Assessment/Plan:  1. Hypertension - Rx filled out for pt to receive free BP cuff to monitor readings at home. BP stable in the low 140s/70s. Given many medication intolerances and BP goal <140/25mmHg, will continue to have pt monitor his BP at home and call with any patterns of elevated systolic BP > [id number].   Megan E. Supple, PharmD, CPP, [place] [address], [place], [place] [address] Phone: [phone]; Fax: [phone] 02/28/2017 11:13 AM

## 2017-03-20 ENCOUNTER — Telehealth: Payer: Self-pay | Admitting: Neurology

## 2017-03-20 NOTE — Telephone Encounter (Signed)
Pt is requesting the results of his nerve conduction test be sent to [facility](attention Dr Amedeo Plenty [phone] phone#[phone]) Pt would like this done asap because he is going out of country a week from today and would like to have surgery date already in place when he comes back.  He has confirmed both #'s to be good contact #'s for him

## 2017-03-21 ENCOUNTER — Telehealth: Payer: Self-pay | Admitting: *Deleted

## 2017-03-21 NOTE — Telephone Encounter (Signed)
Pt EMG study faxed to [facility] on 03/21/17.

## 2017-04-19 DIAGNOSIS — H04123 Dry eye syndrome of bilateral lacrimal glands: Secondary | ICD-10-CM | POA: Diagnosis not present

## 2017-04-19 DIAGNOSIS — D3131 Benign neoplasm of right choroid: Secondary | ICD-10-CM | POA: Diagnosis not present

## 2017-04-19 DIAGNOSIS — H2513 Age-related nuclear cataract, bilateral: Secondary | ICD-10-CM | POA: Diagnosis not present

## 2017-04-21 ENCOUNTER — Other Ambulatory Visit: Payer: Self-pay | Admitting: Pharmacist

## 2017-04-21 MED ORDER — EVOLOCUMAB 140 MG/ML ~~LOC~~ SOAJ: 1.0000 "pen " | SUBCUTANEOUS | 11 refills | Status: DC

## 2017-05-16 DIAGNOSIS — L814 Other melanin hyperpigmentation: Secondary | ICD-10-CM | POA: Diagnosis not present

## 2017-05-16 DIAGNOSIS — L570 Actinic keratosis: Secondary | ICD-10-CM | POA: Diagnosis not present

## 2017-05-16 DIAGNOSIS — L218 Other seborrheic dermatitis: Secondary | ICD-10-CM | POA: Diagnosis not present

## 2017-05-16 DIAGNOSIS — D224 Melanocytic nevi of scalp and neck: Secondary | ICD-10-CM | POA: Diagnosis not present

## 2017-05-16 DIAGNOSIS — Z85828 Personal history of other malignant neoplasm of skin: Secondary | ICD-10-CM | POA: Diagnosis not present

## 2017-05-16 DIAGNOSIS — L821 Other seborrheic keratosis: Secondary | ICD-10-CM | POA: Diagnosis not present

## 2017-05-16 DIAGNOSIS — D2272 Melanocytic nevi of left lower limb, including hip: Secondary | ICD-10-CM | POA: Diagnosis not present

## 2017-05-16 DIAGNOSIS — D2261 Melanocytic nevi of right upper limb, including shoulder: Secondary | ICD-10-CM | POA: Diagnosis not present

## 2017-05-16 DIAGNOSIS — D1801 Hemangioma of skin and subcutaneous tissue: Secondary | ICD-10-CM | POA: Diagnosis not present

## 2017-05-16 DIAGNOSIS — D485 Neoplasm of uncertain behavior of skin: Secondary | ICD-10-CM | POA: Diagnosis not present

## 2017-05-16 DIAGNOSIS — D2262 Melanocytic nevi of left upper limb, including shoulder: Secondary | ICD-10-CM | POA: Diagnosis not present

## 2017-05-16 DIAGNOSIS — D2271 Melanocytic nevi of right lower limb, including hip: Secondary | ICD-10-CM | POA: Diagnosis not present

## 2017-05-16 DIAGNOSIS — D225 Melanocytic nevi of trunk: Secondary | ICD-10-CM | POA: Diagnosis not present

## 2017-05-17 DIAGNOSIS — G5602 Carpal tunnel syndrome, left upper limb: Secondary | ICD-10-CM | POA: Diagnosis not present

## 2017-05-17 DIAGNOSIS — R202 Paresthesia of skin: Secondary | ICD-10-CM | POA: Diagnosis not present

## 2017-05-17 DIAGNOSIS — R200 Anesthesia of skin: Secondary | ICD-10-CM | POA: Diagnosis not present

## 2017-05-17 DIAGNOSIS — M19132 Post-traumatic osteoarthritis, left wrist: Secondary | ICD-10-CM | POA: Diagnosis not present

## 2017-05-17 DIAGNOSIS — G5601 Carpal tunnel syndrome, right upper limb: Secondary | ICD-10-CM | POA: Diagnosis not present

## 2017-07-12 ENCOUNTER — Telehealth: Payer: Self-pay | Admitting: Gastroenterology

## 2017-07-12 ENCOUNTER — Other Ambulatory Visit: Payer: Self-pay | Admitting: Internal Medicine

## 2017-07-12 DIAGNOSIS — R109 Unspecified abdominal pain: Secondary | ICD-10-CM

## 2017-07-12 NOTE — Telephone Encounter (Signed)
Pt would like to est with dr Retail banker. Pt said [facility] recommended dr Retail banker. Pt has medicare. Can I sch? Pt is aware md out of office this week

## 2017-07-13 ENCOUNTER — Ambulatory Visit
Admission: RE | Admit: 2017-07-13 | Discharge: 2017-07-13 | Disposition: A | Discharge status: Home / Self Care | Payer: Medicare Other | Source: Ambulatory Visit | Attending: Internal Medicine | Admitting: Internal Medicine

## 2017-07-13 DIAGNOSIS — M19131 Post-traumatic osteoarthritis, right wrist: Secondary | ICD-10-CM | POA: Diagnosis not present

## 2017-07-13 DIAGNOSIS — R109 Unspecified abdominal pain: Secondary | ICD-10-CM

## 2017-07-13 DIAGNOSIS — K5730 Diverticulosis of large intestine without perforation or abscess without bleeding: Secondary | ICD-10-CM | POA: Diagnosis not present

## 2017-07-13 DIAGNOSIS — G5601 Carpal tunnel syndrome, right upper limb: Secondary | ICD-10-CM | POA: Diagnosis not present

## 2017-07-13 DIAGNOSIS — M1811 Unilateral primary osteoarthritis of first carpometacarpal joint, right hand: Secondary | ICD-10-CM | POA: Diagnosis not present

## 2017-07-13 DIAGNOSIS — M19132 Post-traumatic osteoarthritis, left wrist: Secondary | ICD-10-CM | POA: Diagnosis not present

## 2017-07-13 MED ORDER — IOPAMIDOL (ISOVUE-300) INJECTION 61%
100.0000 mL | Freq: Once | INTRAVENOUS | Status: AC | PRN
  Administered 2017-07-13: 100 mL via INTRAVENOUS

## 2017-07-20 DIAGNOSIS — M5442 Lumbago with sciatica, left side: Secondary | ICD-10-CM | POA: Diagnosis not present

## 2017-07-20 DIAGNOSIS — M25552 Pain in left hip: Secondary | ICD-10-CM | POA: Diagnosis not present

## 2017-08-10 ENCOUNTER — Ambulatory Visit (INDEPENDENT_AMBULATORY_CARE_PROVIDER_SITE_OTHER): Payer: Medicare Other | Admitting: Podiatry

## 2017-08-10 ENCOUNTER — Encounter: Payer: Self-pay | Admitting: Podiatry

## 2017-08-10 VITALS — BP 153/74 | HR 79 | Resp 18

## 2017-08-10 DIAGNOSIS — B351 Tinea unguium: Secondary | ICD-10-CM

## 2017-08-10 DIAGNOSIS — L84 Corns and callosities: Secondary | ICD-10-CM | POA: Diagnosis not present

## 2017-08-10 DIAGNOSIS — T148XXA Other injury of unspecified body region, initial encounter: Secondary | ICD-10-CM

## 2017-08-10 DIAGNOSIS — M2040 Other hammer toe(s) (acquired), unspecified foot: Secondary | ICD-10-CM | POA: Diagnosis not present

## 2017-08-10 DIAGNOSIS — Z79899 Other long term (current) drug therapy: Secondary | ICD-10-CM | POA: Diagnosis not present

## 2017-08-10 LAB — CBC WITH DIFFERENTIAL/PLATELET
Basophils Absolute: 0 cells/uL (ref 0–200)
Basophils Relative: 0 %
Eosinophils Absolute: 54 cells/uL (ref 15–500)
Eosinophils Relative: 1 %
HEMATOCRIT: 41.6 % (ref 38.5–50.0)
HEMOGLOBIN: 14.3 g/dL (ref 13.2–17.1)
LYMPHS ABS: 1404 {cells}/uL (ref 850–3900)
Lymphocytes Relative: 26 %
MCH: 31.6 pg (ref 27.0–33.0)
MCHC: 34.4 g/dL (ref 32.0–36.0)
MCV: 92 fL (ref 80.0–100.0)
MONO ABS: 486 {cells}/uL (ref 200–950)
MPV: 10 fL (ref 7.5–12.5)
Monocytes Relative: 9 %
NEUTROS ABS: 3456 {cells}/uL (ref 1500–7800)
NEUTROS PCT: 64 %
Platelets: 177 10*3/uL (ref 140–400)
RBC: 4.52 MIL/uL (ref 4.20–5.80)
RDW: 12.6 % (ref 11.0–15.0)
WBC: 5.4 10*3/uL (ref 3.8–10.8)

## 2017-08-10 LAB — HEPATIC FUNCTION PANEL
ALK PHOS: 52 U/L (ref 40–115)
ALT: 23 U/L (ref 9–46)
AST: 18 U/L (ref 10–35)
Albumin: 4.2 g/dL (ref 3.6–5.1)
BILIRUBIN INDIRECT: 0.4 mg/dL (ref 0.2–1.2)
Bilirubin, Direct: 0.1 mg/dL (ref <=0.2)
Total Bilirubin: 0.5 mg/dL (ref 0.2–1.2)
Total Protein: 6.5 g/dL (ref 6.1–8.1)

## 2017-08-10 MED ORDER — TERBINAFINE HCL 250 MG PO TABS: 250.0000 mg | ORAL_TABLET | Freq: Every day | ORAL | 0 refills | Status: DC

## 2017-08-10 NOTE — Patient Instructions (Signed)

## 2017-08-10 NOTE — Progress Notes (Signed)
Pt wanted me to make a note about the "latex" toe pads he got during his visit today. He stated one fitted him perfectly and the other one was causing a problem so he took them off and realized that one was a size bigger and is too big for his toe. I told the pt he could come back by the office today to get another one the same size. Pt stated he is unsure if he will be able to come as he is leaving for out of town in the morning but said if he cannot make it back today he will be back in after Labor Day.

## 2017-08-15 NOTE — Progress Notes (Signed)
Subjective: [name] since the office today for concerns of continued thickening and discoloration to his toenails. He is been using urea Cream to his toenails in the beginning center however there still discolored. He has not had any pain to the toenail he denies any redness or drainage to the nails. He also states that the tip of the second toe he gets a callus which becomes painful. Denies any drainage or pus coming from the steering denies any swelling. Denies any systemic complaints such as fevers, chills, nausea, vomiting. No acute changes since last appointment, and no other complaints at this time.   Objective: AAO x3, NAD DP/PT pulses palpable bilaterally, CRT less than 3 seconds Nails are discolored, dystrophic, hypertrophic with yellow to brown discoloration. There is no pain of the nails there is no surrounding redness or drainage. There is no clinical signs of infection present. Small hyperkeratotic lesions present at the distal aspect of bilateral second digits. No underlying ulceration, drainage or any clinical signs of infection present. The plantar aspect the left third digit is what appears to be an old blister that is deroofed there is new granulation superficial abrasion type wound present. There is no surrounding erythema, ascending cellulitis. There is no fluctuance or crepitus. There is no malodor.  No open lesions or pre-ulcerative lesions.  Hammertoes are present  No pain with calf compression, swelling, warmth, erythema  Assessment: Onychomycosis, hyperkeratotic lesions, superficial wound   Plan: -All treatment options discussed with the patient including all alternatives, risks, complications.  -Regards the nail fungus we had a discussion about further treatment options. At this point we will proceed with oral Lamisil discussed side effects the medication. Prescribed the medication and I also ordered blood work. He is not to start the medication until I call him with  results of the blood work and he verbally understood this. -Hyperkeratotic lesions were sharply debrided 2 without complications or bleeding. Toe crests were dispensed -Superficial abrasion from old blister left third toe. Recommended antibody ointment and a bandage daily. Monitor infection. Does not heal in 2 weeks to call the office. -Follow-up in 4-5 weeks after starting Lamisil or sooner if needed. -Patient encouraged to call the office with any questions, concerns, change in symptoms.   Annice Needy, DPM

## 2017-08-16 ENCOUNTER — Telehealth: Payer: Self-pay | Admitting: Podiatry

## 2017-08-16 DIAGNOSIS — M654 Radial styloid tenosynovitis [de Quervain]: Secondary | ICD-10-CM | POA: Diagnosis not present

## 2017-08-16 DIAGNOSIS — G5602 Carpal tunnel syndrome, left upper limb: Secondary | ICD-10-CM | POA: Diagnosis not present

## 2017-08-16 DIAGNOSIS — M19132 Post-traumatic osteoarthritis, left wrist: Secondary | ICD-10-CM | POA: Diagnosis not present

## 2017-08-16 DIAGNOSIS — M19131 Post-traumatic osteoarthritis, right wrist: Secondary | ICD-10-CM | POA: Diagnosis not present

## 2017-08-16 DIAGNOSIS — M1811 Unilateral primary osteoarthritis of first carpometacarpal joint, right hand: Secondary | ICD-10-CM | POA: Diagnosis not present

## 2017-08-16 IMAGING — CT CT ABD-PELV W/ CM
2 of 5 series · 16 of 46 positions shown, 18 images · IV contrast (isovue)
Comparison: None.

CLINICAL DATA: Bilateral groin pain beginning this morning.
Diarrhea and chills.

EXAM:
CT ABDOMEN AND PELVIS WITH CONTRAST
TECHNIQUE: Multidetector CT imaging of the abdomen and pelvis was performed
using the standard protocol following bolus administration of
intravenous contrast.
CONTRAST:  80 cc Isovue 370.

[Series 2: abd/pel with · axial · 0.96mm/px · z∈[+1013,+1493]mm · 13 of 110 slices shown, 15 images]
[im 7/110  soft-tissue]
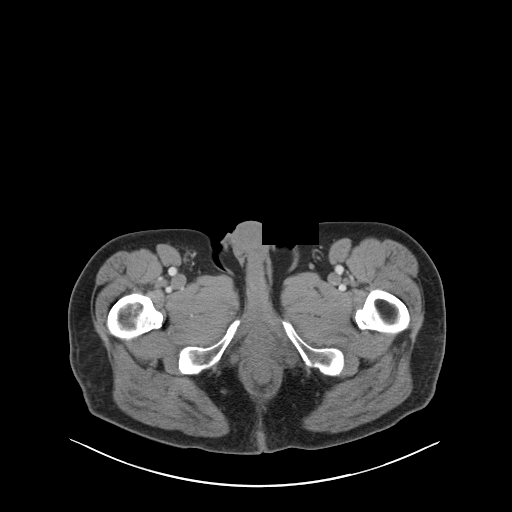
[im 7/110  bone]
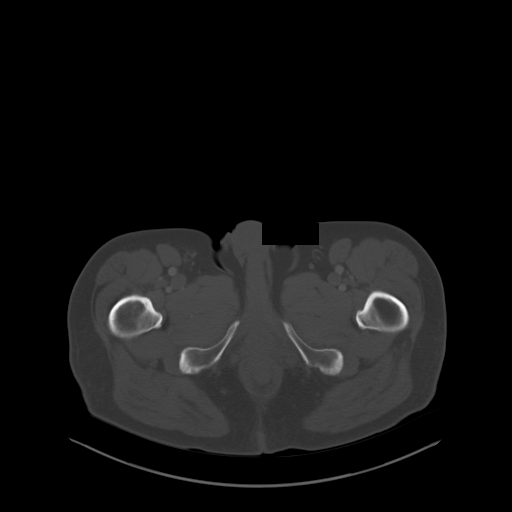
[im 14/110  soft-tissue]
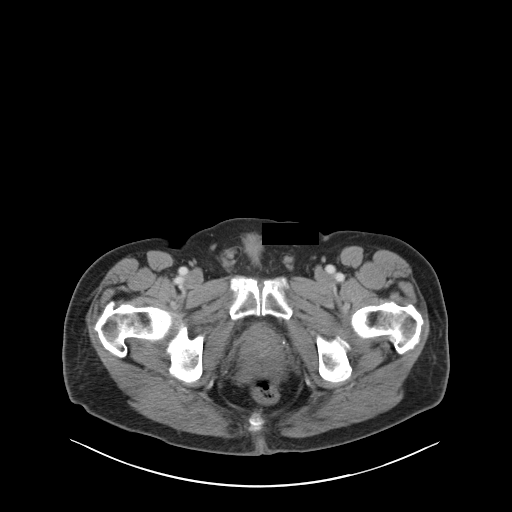
[im 21/110  soft-tissue]
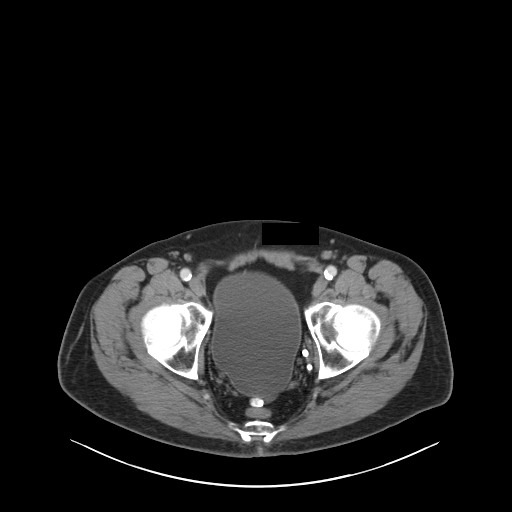
[im 35/110  soft-tissue]
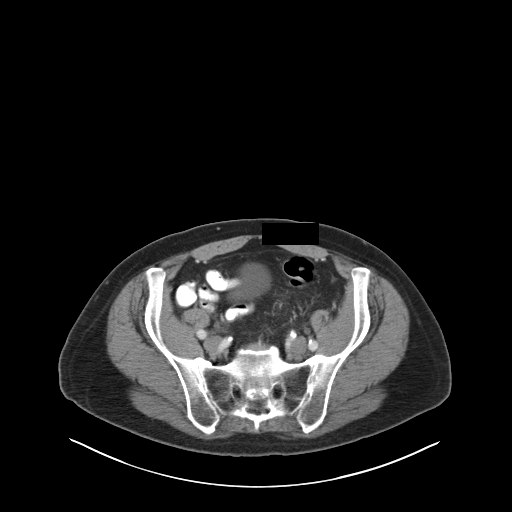
[im 41/110  soft-tissue]
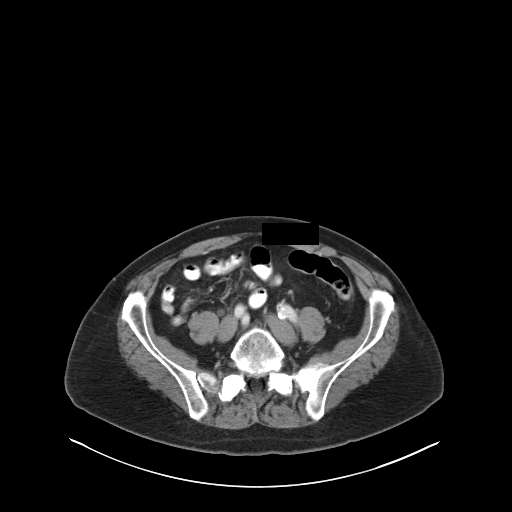
[im 48/110  soft-tissue]
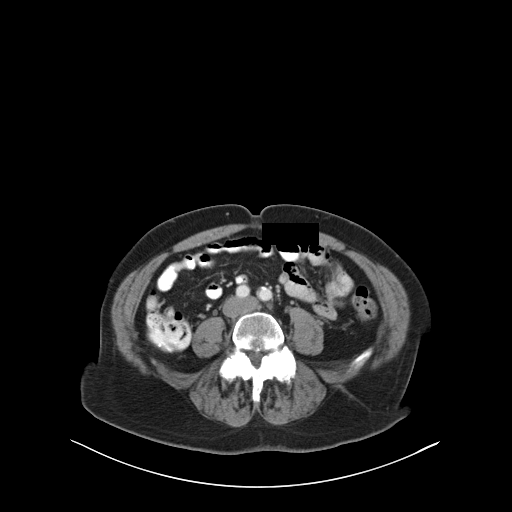
[im 55/110  soft-tissue]
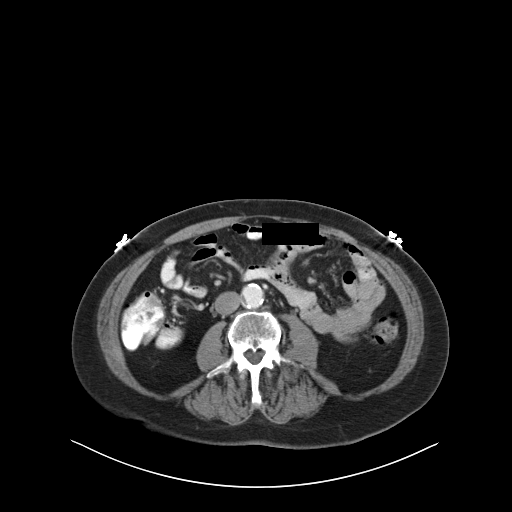
[im 62/110  soft-tissue]
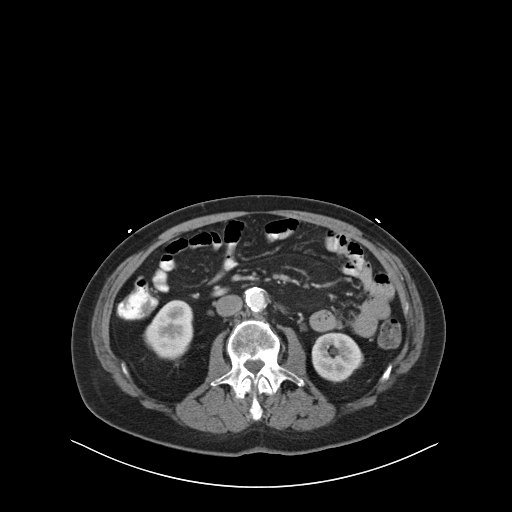
[im 69/110  soft-tissue]
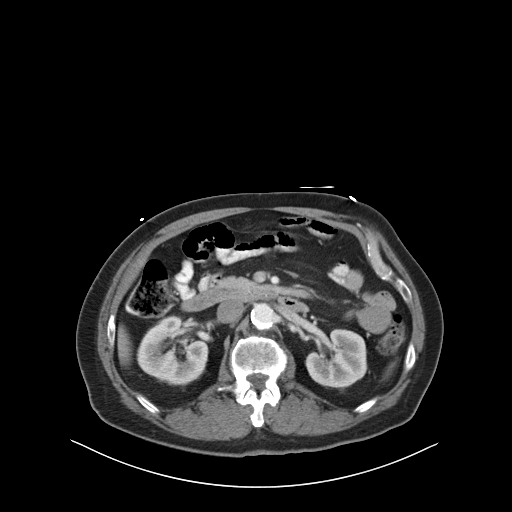
[im 69/110  bone]
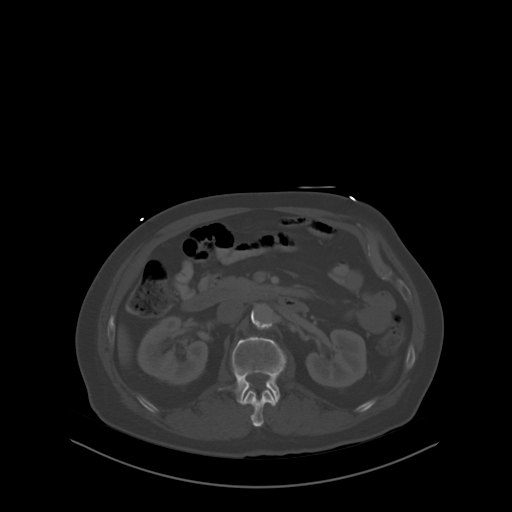
[im 75/110  soft-tissue]
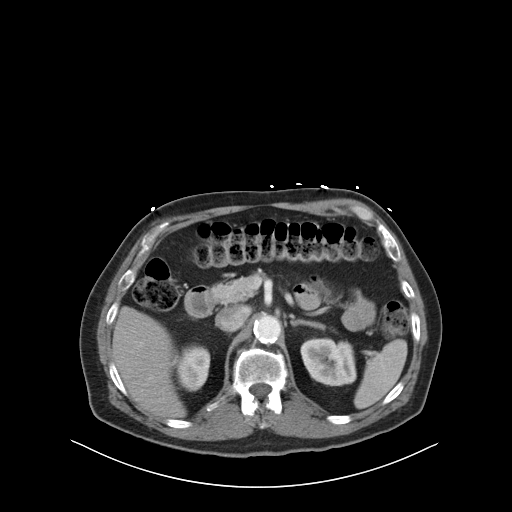
[im 89/110  soft-tissue]
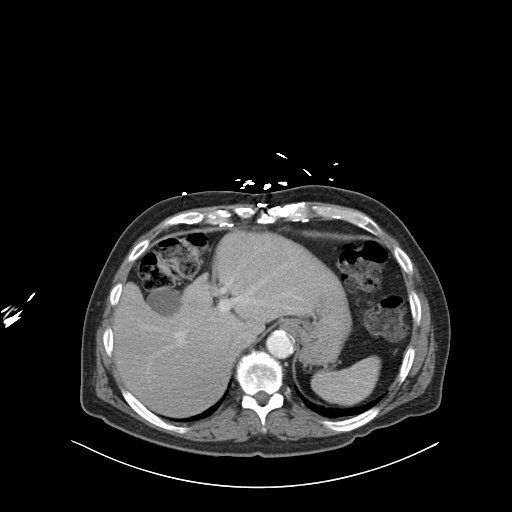
[im 96/110  soft-tissue]
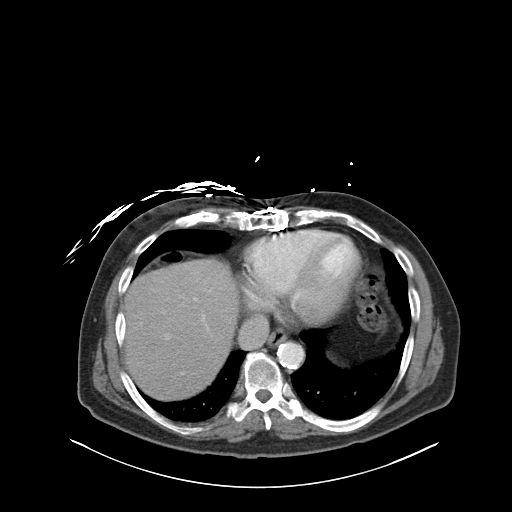
[im 103/110  soft-tissue]
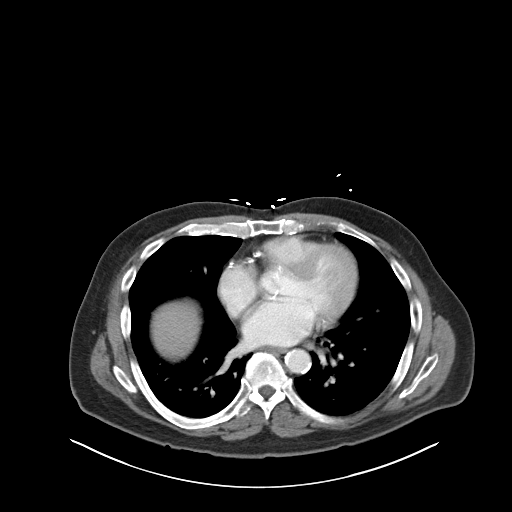

[Series 6: coronal a/|p · coronal · 0.74mm/px · 3 of 135 slices shown]
[im 45/135  soft-tissue]
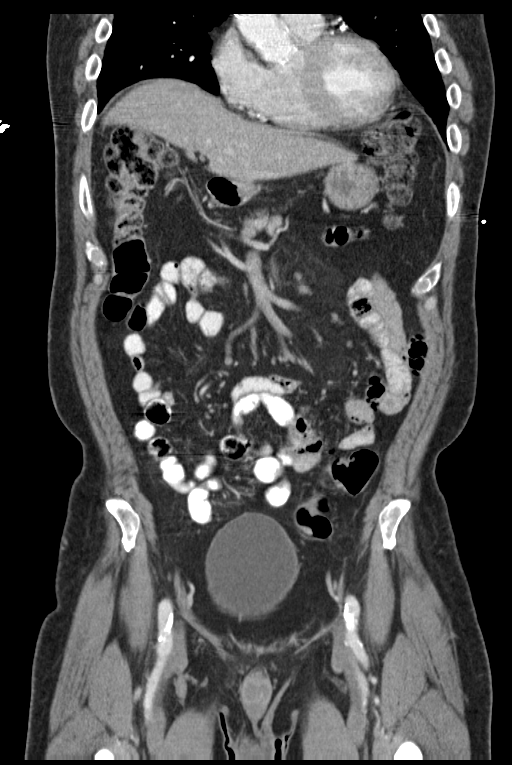
[im 60/135  soft-tissue]
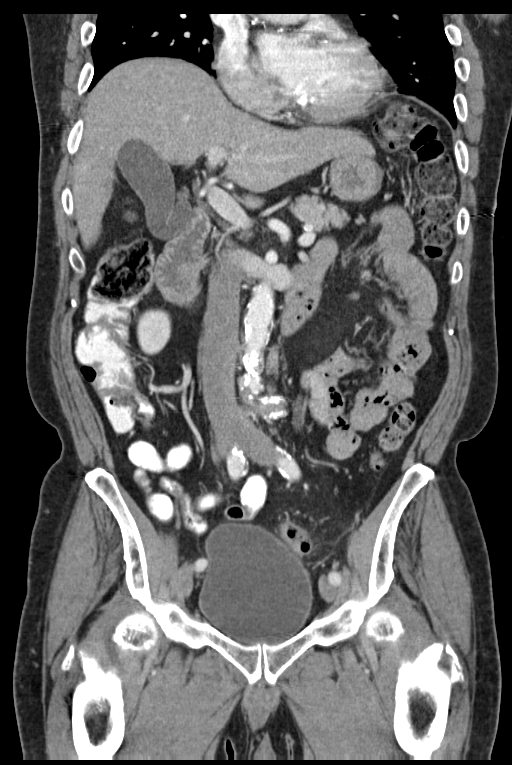
[im 75/135  soft-tissue]
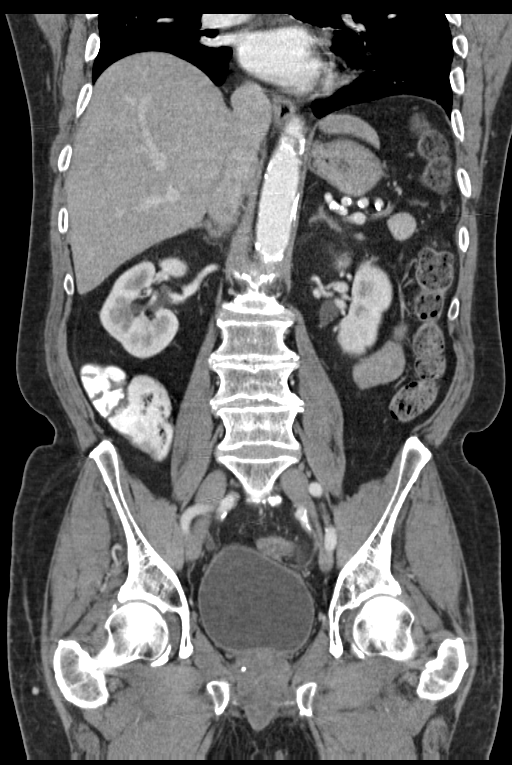

[16 of 46 positions shown; findings below may reference images not displayed]

FINDINGS: Mild dependent atelectasis is present in the lung bases. No pleural
or pericardial effusion. Extensive calcific aortic and coronary
atherosclerosis is identified. Heart size is normal.

The gallbladder, liver, spleen, adrenal glands, pancreas and left
kidney are unremarkable. Punctate hypoattenuating lesion in the
lower pole of the right kidney on image 25 cannot be definitively
characterized but is likely a cyst.

Extensive aortoiliac atherosclerosis without aneurysm is identified.
Sigmoid diverticulosis is seen with wall thickening and pericolonic
stranding in the mid sigmoid colon consistent with acute
diverticulitis. No abscess or perforation. Moderate stool burden
throughout the remainder of the colon is noted. The appendix has
been removed. The stomach and small bowel are unremarkable. No
lymphadenopathy is identified.

No focal bony abnormality is seen. Thoracic and lumbar spondylosis
is noted.
IMPRESSION: Sigmoid diverticulitis without abscess or perforation.

Extensive calcific aortic and coronary atherosclerosis.

## 2017-08-16 NOTE — Telephone Encounter (Signed)
I saw Dr. Jacqualyn Posey on 30 August and had some test's done. I have not heard anything back about if I can start the medication prescribed. Please call me back at [phone]. Thank you.

## 2017-08-17 ENCOUNTER — Telehealth: Payer: Self-pay | Admitting: *Deleted

## 2017-08-17 NOTE — Telephone Encounter (Signed)
I left message informing pt of Dr. Leigh Aurora review of results and orders.

## 2017-08-17 NOTE — Telephone Encounter (Addendum)
-----   Message from Trula Slade, DPM sent at 08/15/2017  7:35 AM EDT ----- Normal- please let him know it is OK to start lamisil. Thanks.08/17/2017-Left message informing pt of Dr. Leigh Aurora review of results and orders.

## 2017-09-01 ENCOUNTER — Other Ambulatory Visit: Payer: Self-pay | Admitting: Pharmacist

## 2017-09-01 DIAGNOSIS — E782 Mixed hyperlipidemia: Secondary | ICD-10-CM

## 2017-09-06 ENCOUNTER — Other Ambulatory Visit: Payer: Medicare Other | Admitting: *Deleted

## 2017-09-06 DIAGNOSIS — E782 Mixed hyperlipidemia: Secondary | ICD-10-CM

## 2017-09-06 LAB — LIPID PANEL
CHOLESTEROL TOTAL: 144 mg/dL (ref 100–199)
Chol/HDL Ratio: 2.1 ratio (ref 0.0–5.0)
HDL: 70 mg/dL (ref >39)
LDL CALC: 52 mg/dL (ref 0–99)
Triglycerides: 111 mg/dL (ref 0–149)
VLDL CHOLESTEROL CAL: 22 mg/dL (ref 5–40)

## 2017-09-07 ENCOUNTER — Other Ambulatory Visit: Payer: Medicare Other

## 2017-09-11 ENCOUNTER — Ambulatory Visit (INDEPENDENT_AMBULATORY_CARE_PROVIDER_SITE_OTHER): Payer: Medicare Other | Admitting: Interventional Cardiology

## 2017-09-11 ENCOUNTER — Encounter: Payer: Self-pay | Admitting: Interventional Cardiology

## 2017-09-11 VITALS — BP 148/86 | HR 80 | Ht 68.0 in | Wt 177.4 lb

## 2017-09-11 DIAGNOSIS — I10 Essential (primary) hypertension: Secondary | ICD-10-CM | POA: Diagnosis not present

## 2017-09-11 DIAGNOSIS — E7849 Other hyperlipidemia: Secondary | ICD-10-CM | POA: Diagnosis not present

## 2017-09-11 DIAGNOSIS — I25810 Atherosclerosis of coronary artery bypass graft(s) without angina pectoris: Secondary | ICD-10-CM

## 2017-09-11 DIAGNOSIS — Z789 Other specified health status: Secondary | ICD-10-CM | POA: Diagnosis not present

## 2017-09-11 MED ORDER — HYDROCHLOROTHIAZIDE 12.5 MG PO CAPS: 12.5000 mg | ORAL_CAPSULE | Freq: Every day | ORAL | 3 refills | Status: DC

## 2017-09-11 MED ORDER — HYDROCHLOROTHIAZIDE 12.5 MG PO CAPS: 12.5000 mg | ORAL_CAPSULE | ORAL | 3 refills | Status: DC

## 2017-09-11 NOTE — Patient Instructions (Addendum)
Medication Instructions:  1) START Hydrochlorothiazide 12.5mg  once daily on Monday, Wednesday and Friday.  Labwork: Your physician recommends that you return for lab work in: December (BMET)   Testing/Procedures: None  Follow-Up: Your physician recommends that you schedule a follow-up appointment in: 3-4 weeks with our Hypertension Clinic.  Your physician wants you to follow-up in: 1 year with Dr. Tamala Julian.  You will receive a reminder letter in the mail two months in advance. If you don't receive a letter, please call our office to schedule the follow-up appointment.    Any Other Special Instructions Will Be Listed Below (If Applicable).     If you need a refill on your cardiac medications before your next appointment, please call your pharmacy.

## 2017-09-11 NOTE — Progress Notes (Signed)
Cardiology Office Note    Date:  09/11/2017   ID:  Breckon, Reeves [birth year], MRN [id number]  PCP:  Marton Redwood, MD  Cardiologist: Sinclair Grooms, MD   Chief Complaint  Patient presents with  . Coronary Artery Disease  . Hypertension    History of Present Illness:  Nathan [name] is a 71 y.o. male who presents for CAD, prior CABG, hypertension, hyperlipidemia, and family history of CAD.Now having increasing difficulty with medication intolerances including beta blockers, statins, and he feels Lexiscan also cause chest pain.    No real complaints. Unable to tolerate blood pressure medications. List of intolerances is inaccurate and under represents the number. He has become less vigilant with blood pressure recording because it frightens him and he notices that the pressures are higher and higher each time he repeats.   Past Medical History:  Diagnosis Date  . Arthritis   . Blood transfusion without reported diagnosis   . Complication of anesthesia    jittery after surgery  . Coronary artery disease   . GERD (gastroesophageal reflux disease)   . Hyperlipemia   . Hypertension   . Right carpal tunnel syndrome 02/08/2017  . Seasonal allergies   . Wears glasses     Past Surgical History:  Procedure Laterality Date  . APPENDECTOMY     age 31  . COLONOSCOPY    . COLONOSCOPY WITH PROPOFOL N/A 02/15/2016   Procedure: COLONOSCOPY WITH PROPOFOL;  Surgeon: Garlan Fair, MD;  Location: [facility];  Service: Endoscopy;  Laterality: N/A;  . CORONARY ARTERY BYPASS GRAFT     2003  . ESOPHAGOGASTRODUODENOSCOPY (EGD) WITH PROPOFOL N/A 02/15/2016   Procedure: ESOPHAGOGASTRODUODENOSCOPY (EGD) WITH PROPOFOL;  Surgeon: Garlan Fair, MD;  Location: [facility];  Service: Endoscopy;  Laterality: N/A;  . EXPLORATION POST OPERATIVE OPEN HEART    . EYE SURGERY     detached retina-lt  . INGUINAL HERNIA REPAIR Left 04/01/2014   Procedure: HERNIA REPAIR INGUINAL ADULT;  Surgeon:  Shann Medal, MD;  Location: [place];  Service: General;  Laterality: Left;  . INSERTION OF MESH Left 04/01/2014   Procedure: INSERTION OF MESH;  Surgeon: Shann Medal, MD;  Location: [place];  Service: General;  Laterality: Left;  . KNEE ARTHROCENTESIS  1980   right  . SHOULDER ACROMIOPLASTY  1985   rt  . SINUS ENDO W/FUSION    . TONSILLECTOMY AND ADENOIDECTOMY      Current Medications: Outpatient Medications Prior to Visit  Medication Sig Dispense Refill  . aspirin EC 81 MG tablet Take 81 mg by mouth daily.    . cetirizine (ZYRTEC) 10 MG tablet Take 10 mg by mouth daily.    . cycloSPORINE (RESTASIS) 0.05 % ophthalmic emulsion Place 1 drop into both eyes 2 (two) times daily.     . Evolocumab (REPATHA SURECLICK) 834 MG/ML SOAJ Inject 1 pen into the skin every 14 (fourteen) days. 2 pen 11  . flurazepam (DALMANE) 30 MG capsule Take 30 mg by mouth at bedtime as needed for sleep.    . nitroGLYCERIN (NITROSTAT) 0.4 MG SL tablet Place 1 tablet (0.4 mg total) under the tongue every 5 (five) minutes as needed for chest pain. 25 tablet prn  . terbinafine (LAMISIL) 250 MG tablet Take 1 tablet (250 mg total) by mouth daily. 90 tablet 0  . triamcinolone (NASACORT) 55 MCG/ACT AERO nasal inhaler Place 2 sprays into the nose daily.    Marland Kitchen  triamcinolone cream (KENALOG) 0.1 % APPLY TO AFFECTED AREA ON THE SKIN TWICE DAILY AS NEEDED, FOR SKIN IRRITATION AS DIRECTED  0  . acetaminophen (TYLENOL) 500 MG tablet Take 1,000 mg by mouth every 6 (six) hours as needed for moderate pain or headache.    . Carboxymethylcell-Hypromellose (GENTEAL) 0.25-0.3 % GEL Place 1 drop into both eyes daily.    . Carboxymethylcellulose Sodium 0.25 % SOLN Apply 1 drop to eye at bedtime.    . Urea 45 % CREA Apply 1 application topically daily. 225 g 2   No facility-administered medications prior to visit.      Allergies:   Lipitor [atorvastatin]; Bystolic [nebivolol hcl]; Lexiscan  [regadenoson]; Lisinopril; Zetia [ezetimibe]; Neosporin [neomycin-bacitracin zn-polymyx]; Pravastatin; and Toprol xl [metoprolol tartrate]   Social History   Social History  . Marital status: Single    Spouse name: N/A  . Number of children: N/A  . Years of education: post grad   Occupational History  . [employer]    Social History Main Topics  . Smoking status: Former Smoker    Quit date: 03/26/1969  . Smokeless tobacco: Never Used  . Alcohol use 0.0 oz/week     Comment: 2 per day  . Drug use: No  . Sexual activity: Not Asked   Other Topics Concern  . None   Social History Narrative   Patient drinks 1-2 cups of caffeine daily.   Patient is left handed.      Family History:  The patient's family history includes Alcohol abuse in his brother; Cancer in his father; Heart disease in his mother; Kidney failure in his brother.   ROS:   Please see the history of present illness.    Occasion abdominal pain otherwise no complaints.  All other systems reviewed and are negative.   PHYSICAL EXAM:   VS:  BP (!) 148/86 (BP Location: Left Arm)   Pulse 80   Ht 5\' 8"  (1.727 m)   Wt 177 lb 6.4 oz (80.5 kg)   BMI 26.97 kg/m    GEN: Well nourished, well developed, in no acute distress  HEENT: normal  Neck: no JVD, carotid bruits, or masses Cardiac: RRR; no murmurs, rubs, or gallops,no edema  Respiratory:  clear to auscultation bilaterally, normal work of breathing GI: soft, nontender, nondistended, + BS MS: no deformity or atrophy  Skin: warm and dry, no rash Neuro:  Alert and Oriented x 3, Strength and sensation are intact Psych: euthymic mood, full affect  Wt Readings from Last 3 Encounters:  09/11/17 177 lb 6.4 oz (80.5 kg)  10/03/16 180 lb 4 oz (81.8 kg)  09/02/16 176 lb 1.9 oz (79.9 kg)      Studies/Labs Reviewed:   EKG:  EKG  Normal sinus rhythm with normal appearance.  Recent Labs: 10/20/2016: BUN 14; Creat 0.75; Potassium 4.2; Sodium 139 08/10/2017: ALT 23; Hemoglobin  14.3; Platelets 177   Lipid Panel    Component Value Date/Time   CHOL 144 09/06/2017 0801   TRIG 111 09/06/2017 0801   HDL 70 09/06/2017 0801   CHOLHDL 2.1 09/06/2017 0801   CHOLHDL 1.8 08/08/2016 0810   VLDL 16 08/08/2016 0810   LDLCALC 52 09/06/2017 0801    Additional studies/ records that were reviewed today include:  Last nuclear study, Lexiscan August 2016: Study Highlights    Nuclear stress EF: 63%.  There was no ST segment deviation noted during stress.  The study is normal.  This is a low risk study.  The left ventricular  ejection fraction is normal (55-65%).   Normal nuclear study with no prior scar or ischemia.        ASSESSMENT:    1. Coronary artery disease involving coronary bypass graft of native heart without angina pectoris   2. Essential hypertension   3. Other hyperlipidemia   4. Medication intolerance      PLAN:  In order of problems listed above:  1. Asymptomatic 2. On no therapy and not well controlled. A repeat blood pressure was 162/88. Start HCTZ 12.5 mg Monday Wednesday and Friday. Follow-up blood pressure clinic in 3-4 weeks. Basic metabolic panel in 7-51 weeks. 3. LDL is 54 on rope after.  Clinical follow-up in one year.    Medication Adjustments/Labs and Tests Ordered: Current medicines are reviewed at length with the patient today.  Concerns regarding medicines are outlined above.  Medication changes, Labs and Tests ordered today are listed in the Patient Instructions below. Patient Instructions  Medication Instructions:  1) START Hydrochlorothiazide 12.5mg  once daily on Monday, Wednesday and Friday.  Labwork: Your physician recommends that you return for lab work in: December (BMET)   Testing/Procedures: None  Follow-Up: Your physician recommends that you schedule a follow-up appointment in: 3-4 weeks with our Hypertension Clinic.  Your physician wants you to follow-up in: 1 year with Dr. Tamala Julian.  You will receive a  reminder letter in the mail two months in advance. If you don't receive a letter, please call our office to schedule the follow-up appointment.    Any Other Special Instructions Will Be Listed Below (If Applicable).     If you need a refill on your cardiac medications before your next appointment, please call your pharmacy.      Signed, Sinclair Grooms, MD  09/11/2017 12:44 PM    [facility] [address], [place], [place]  [address] Phone: [phone]; Fax: [phone]

## 2017-09-12 ENCOUNTER — Telehealth: Payer: Self-pay | Admitting: Interventional Cardiology

## 2017-09-12 NOTE — Telephone Encounter (Signed)
Informed pt of lab results. Pt verbalized understanding. 

## 2017-09-12 NOTE — Telephone Encounter (Signed)
New Message ° ° pt verbalized that he is returning call for rn  °

## 2017-09-13 DIAGNOSIS — M19131 Post-traumatic osteoarthritis, right wrist: Secondary | ICD-10-CM | POA: Diagnosis not present

## 2017-09-13 DIAGNOSIS — M25532 Pain in left wrist: Secondary | ICD-10-CM | POA: Diagnosis not present

## 2017-09-13 DIAGNOSIS — M654 Radial styloid tenosynovitis [de Quervain]: Secondary | ICD-10-CM | POA: Diagnosis not present

## 2017-09-13 DIAGNOSIS — G5601 Carpal tunnel syndrome, right upper limb: Secondary | ICD-10-CM | POA: Diagnosis not present

## 2017-09-13 DIAGNOSIS — G5602 Carpal tunnel syndrome, left upper limb: Secondary | ICD-10-CM | POA: Diagnosis not present

## 2017-09-14 ENCOUNTER — Encounter: Payer: Self-pay | Admitting: Podiatry

## 2017-09-14 ENCOUNTER — Ambulatory Visit (INDEPENDENT_AMBULATORY_CARE_PROVIDER_SITE_OTHER): Payer: Medicare Other | Admitting: Podiatry

## 2017-09-14 DIAGNOSIS — Z79899 Other long term (current) drug therapy: Secondary | ICD-10-CM

## 2017-09-14 DIAGNOSIS — I25810 Atherosclerosis of coronary artery bypass graft(s) without angina pectoris: Secondary | ICD-10-CM

## 2017-09-14 DIAGNOSIS — B351 Tinea unguium: Secondary | ICD-10-CM | POA: Diagnosis not present

## 2017-09-14 LAB — CBC WITH DIFFERENTIAL/PLATELET
BASOS PCT: 0.2 %
Basophils Absolute: 22 cells/uL (ref 0–200)
EOS PCT: 0 %
Eosinophils Absolute: 0 cells/uL — ABNORMAL LOW (ref 15–500)
HEMATOCRIT: 39.7 % (ref 38.5–50.0)
HEMOGLOBIN: 14.1 g/dL (ref 13.2–17.1)
LYMPHS ABS: 1075 {cells}/uL (ref 850–3900)
MCH: 32.1 pg (ref 27.0–33.0)
MCHC: 35.5 g/dL (ref 32.0–36.0)
MCV: 90.4 fL (ref 80.0–100.0)
MPV: 10.2 fL (ref 7.5–12.5)
Monocytes Relative: 7 %
NEUTROS ABS: 9318 {cells}/uL — AB (ref 1500–7800)
Neutrophils Relative %: 83.2 %
Platelets: 219 10*3/uL (ref 140–400)
RBC: 4.39 10*6/uL (ref 4.20–5.80)
RDW: 12.1 % (ref 11.0–15.0)
Total Lymphocyte: 9.6 %
WBC: 11.2 10*3/uL — AB (ref 3.8–10.8)
WBCMIX: 784 {cells}/uL (ref 200–950)

## 2017-09-14 LAB — HEPATIC FUNCTION PANEL
AG RATIO: 1.8 (calc) (ref 1.0–2.5)
ALBUMIN MSPROF: 4.2 g/dL (ref 3.6–5.1)
ALT: 18 U/L (ref 9–46)
AST: 14 U/L (ref 10–35)
Alkaline phosphatase (APISO): 48 U/L (ref 40–115)
BILIRUBIN TOTAL: 0.4 mg/dL (ref 0.2–1.2)
Bilirubin, Direct: 0.1 mg/dL (ref 0.0–0.2)
GLOBULIN: 2.4 g/dL (ref 1.9–3.7)
Indirect Bilirubin: 0.3 mg/dL (calc) (ref 0.2–1.2)
Total Protein: 6.6 g/dL (ref 6.1–8.1)

## 2017-09-15 ENCOUNTER — Telehealth: Payer: Self-pay | Admitting: *Deleted

## 2017-09-15 NOTE — Progress Notes (Signed)
Subjective: Nathan [name] presents the office today for follow up evaluation of onychomycosis. He is currently on Lamisil he denies any side effects the medication. He states that the left third digit toenail came off and the right second toenail is partly, off. Denies any pain in the nails and denies any redness or drainage or any swelling. He has no new concerns today. Denies any systemic complaints such as fevers, chills, nausea, vomiting. No acute changes since last appointment, and no other complaints at this time.   Objective: AAO x3, NAD DP/PT pulses palpable bilaterally, CRT less than 3 seconds Nails can be hypertrophic, dystrophic and discolored with yellow to brown discoloration. There is minimal improvement in the nails Nira Conn does be be some mild clear on the proximal nail border. The left third digit toenail has come off completely and the nail bed is healed. The right second toenail has come off distally with family. Proximally. There is no edema, erythema, drainage or pus or any clinical signs of infection present. No other open lesions or pre-ulcerative lesions identified today. No pain with calf compression, swelling, warmth, erythema  Assessment: 71 year old male with onychomycosis, onycholysis  Plan: -All treatment options discussed with the patient including all alternatives, risks, complications.  -At this point is having no side effects the medication, Lamisil. We will continue with this. Recheck CBC as well as LT which were ordered today. Continue to monitor for any side effects. -All the nails, there is no signs of infection. Continue to monitor the areas are healed. -Follow up in 2 months or sooner if any issues are to arise. -Patient encouraged to call the office with any questions, concerns, change in symptoms.   Celesta Gentile, DPM

## 2017-09-15 NOTE — Telephone Encounter (Addendum)
-----   Message from Trula Slade, DPM sent at 09/15/2017  2:53 PM EDT ----- Please let him know his liver function is normal and can continue lamisil. However, his WBC count is low and his neutrophils are high. This needs to be sent to his PCP. I informed pt of Dr. Leigh Aurora review of results and orders. Pt states he has no symptoms of infection, had flu shot yesterday, and 1st appt with new PCP Dr.Doug Brigitte Pulse is 10/24/2017. Faxed labs to Dr. Brigitte Pulse.

## 2017-10-03 DIAGNOSIS — L570 Actinic keratosis: Secondary | ICD-10-CM | POA: Diagnosis not present

## 2017-10-03 DIAGNOSIS — Z85828 Personal history of other malignant neoplasm of skin: Secondary | ICD-10-CM | POA: Diagnosis not present

## 2017-10-03 DIAGNOSIS — L218 Other seborrheic dermatitis: Secondary | ICD-10-CM | POA: Diagnosis not present

## 2017-10-10 ENCOUNTER — Ambulatory Visit: Payer: Medicare Other

## 2017-10-24 DIAGNOSIS — Z6827 Body mass index (BMI) 27.0-27.9, adult: Secondary | ICD-10-CM | POA: Diagnosis not present

## 2017-10-24 DIAGNOSIS — E7849 Other hyperlipidemia: Secondary | ICD-10-CM | POA: Diagnosis not present

## 2017-10-24 DIAGNOSIS — Z86010 Personal history of colonic polyps: Secondary | ICD-10-CM | POA: Diagnosis not present

## 2017-10-24 DIAGNOSIS — M48061 Spinal stenosis, lumbar region without neurogenic claudication: Secondary | ICD-10-CM | POA: Diagnosis not present

## 2017-10-24 DIAGNOSIS — I10 Essential (primary) hypertension: Secondary | ICD-10-CM | POA: Diagnosis not present

## 2017-10-24 DIAGNOSIS — Z206 Contact with and (suspected) exposure to human immunodeficiency virus [HIV]: Secondary | ICD-10-CM | POA: Diagnosis not present

## 2017-10-24 DIAGNOSIS — Z1389 Encounter for screening for other disorder: Secondary | ICD-10-CM | POA: Diagnosis not present

## 2017-10-24 DIAGNOSIS — I25810 Atherosclerosis of coronary artery bypass graft(s) without angina pectoris: Secondary | ICD-10-CM | POA: Diagnosis not present

## 2017-11-05 NOTE — Progress Notes (Addendum)
Patient ID: ABU HEAVIN                 DOB: 04-Nov-1946                      MRN: [id number]     HPI: Nathan [name] is a 71 y.o. male referred by Dr. Tamala Julian to HTN clinic. PMH is significant for CAD, prior CABG in 2005, HTN, and HLD. He has several medication intolerances, including to beta blockers and statins.  Patient arrives to clinic today in good spirits. Denies headache, dizziness, lightheadedness, or falls. Had stomach upset for the first few days after starting HCTZ and Lamisil. Reports flushing (worse on Mondays), but not sure if it is from HCTZ or wine (drinks 2 glasses per day). Pt states he would like to continue HCTZ until he finishes the Lamisil to see if the flushing was caused by the Lamisil. He has not been checking his blood pressure at home.   Current HTN meds: HCTZ 12.5mg  on Monday, Wednesday, and Friday Previously tried: metoprolol and nebivolol (rash), amlodipine (bloating,GI upset and diarrhea), lisinopril 10mg  daily and 5mg  BID (bloating, GI upset and constipation) BP goal: < 140/17mmHg   Family History: Heart disease in mother. Kidney failure and alcohol abuse in brother. Cancer in father.  Social History: Former smoker, quit smoking in 1970. Drinks 2 glasses of wine per day. Denies illicit drug use.  Diet: Eats a lot of salmon and seafood. Cooks at home and rarely eats out. Snacks on granola with fruit. Doesn't add salt to food.  Drinks: 12oz of coffee daily  Exercise: Treadmill for 40 minutes or walks 3-4 miles each day.   Home BP readings: Has not been checking at home due to worry of it being high. Dr. Brigitte Pulse checked it on 10/24/17 and it was in the 401U systolic. Reports that his diastolic is always in the 70s.  Wt Readings from Last 3 Encounters:  09/11/17 177 lb 6.4 oz (80.5 kg)  10/03/16 180 lb 4 oz (81.8 kg)  09/02/16 176 lb 1.9 oz (79.9 kg)   BP Readings from Last 3 Encounters:  09/11/17 (!) 148/86  08/10/17 (!) 153/74  02/28/17 (!) 144/76    Pulse Readings from Last 3 Encounters:  09/11/17 80  08/10/17 79  10/20/16 74    Renal function: CrCl cannot be calculated (Patient's most recent lab result is older than the maximum 21 days allowed.).  Past Medical History:  Diagnosis Date  . Arthritis   . Blood transfusion without reported diagnosis   . Complication of anesthesia    jittery after surgery  . Coronary artery disease   . GERD (gastroesophageal reflux disease)   . Hyperlipemia   . Hypertension   . Right carpal tunnel syndrome 02/08/2017  . Seasonal allergies   . Wears glasses     Current Outpatient Medications on File Prior to Visit  Medication Sig Dispense Refill  . aspirin EC 81 MG tablet Take 81 mg by mouth daily.    . cetirizine (ZYRTEC) 10 MG tablet Take 10 mg by mouth daily.    . cycloSPORINE (RESTASIS) 0.05 % ophthalmic emulsion Place 1 drop into both eyes 2 (two) times daily.     . Evolocumab (REPATHA SURECLICK) 272 MG/ML SOAJ Inject 1 pen into the skin every 14 (fourteen) days. 2 pen 11  . flurazepam (DALMANE) 30 MG capsule Take 30 mg by mouth at bedtime as needed for sleep.    . hydrochlorothiazide (  MICROZIDE) 12.5 MG capsule Take 1 capsule (12.5 mg total) by mouth every Monday, Wednesday, and Friday. 45 capsule 3  . nitroGLYCERIN (NITROSTAT) 0.4 MG SL tablet Place 1 tablet (0.4 mg total) under the tongue every 5 (five) minutes as needed for chest pain. 25 tablet prn  . terbinafine (LAMISIL) 250 MG tablet Take 1 tablet (250 mg total) by mouth daily. 90 tablet 0  . triamcinolone (NASACORT) 55 MCG/ACT AERO nasal inhaler Place 2 sprays into the nose daily.    Marland Kitchen triamcinolone cream (KENALOG) 0.1 % APPLY TO AFFECTED AREA ON THE SKIN TWICE DAILY AS NEEDED, FOR SKIN IRRITATION AS DIRECTED  0   No current facility-administered medications on file prior to visit.     Allergies  Allergen Reactions  . Lipitor [Atorvastatin] Rash  . Bystolic [Nebivolol Hcl] Rash  . Lexiscan [Regadenoson] Other (See  Comments)    Sharp sternal pain  . Lisinopril     GI upset  . Zetia [Ezetimibe] Diarrhea    GI effects - bloating, cramping, diarrhea on 5mg  and 10mg  dose  . Neosporin [Neomycin-Bacitracin Zn-Polymyx] Rash  . Pravastatin Rash  . Toprol Xl [Metoprolol Tartrate] Rash     Assessment/Plan:  1. Hypertension - Patient's BP is 170/88 and 168/68 on recheck today, which is above his goal of < 140/90 mmHg. Continue HCTZ until it is determined if Lamisil will be continued or stopped per pt concern over flushing ? caused by HCTZ vs Lamisil. Patient states that he had BMET on 10/29/17, will have results faxed over from Dr. Raul Del office. Pt follows up with his podiatrist this Thursday - will follow-up via phone call this Friday, will decide whether to increase HCTZ to 12.5mg  daily (if Lamisil is stopped) or switch to losartan 50mg  daily (if Lamisil is continued) at that time per pt preference. Follow-up in HTN clinic in 3 weeks.  Patient seen with Levonne Lapping, PharmD Candidate  Addendum: BMET result 10/24/17 stable: SCr 0.8, K 4.3

## 2017-11-06 ENCOUNTER — Ambulatory Visit (INDEPENDENT_AMBULATORY_CARE_PROVIDER_SITE_OTHER): Payer: Medicare Other | Admitting: Pharmacist

## 2017-11-06 ENCOUNTER — Other Ambulatory Visit: Payer: Medicare Other

## 2017-11-06 VITALS — BP 168/68 | HR 71

## 2017-11-06 DIAGNOSIS — I10 Essential (primary) hypertension: Secondary | ICD-10-CM

## 2017-11-06 NOTE — Patient Instructions (Addendum)
It was great to see you today!  1. Continue your HCTZ 12.5mg  Monday, Wednesday, Friday for now.   2. Fax [place] your lab results from your last appointment. Fax # [phone]  3. Call us once you see your physician to let us know if they decide to continue or stop your Lamisil. Phone # [phone]  3. Follow-up in clinic in 3 weeks.

## 2017-11-09 ENCOUNTER — Ambulatory Visit (INDEPENDENT_AMBULATORY_CARE_PROVIDER_SITE_OTHER): Payer: Medicare Other | Admitting: Podiatry

## 2017-11-09 ENCOUNTER — Encounter: Payer: Self-pay | Admitting: Podiatry

## 2017-11-09 ENCOUNTER — Telehealth: Payer: Self-pay | Admitting: Pharmacist

## 2017-11-09 DIAGNOSIS — Z79899 Other long term (current) drug therapy: Secondary | ICD-10-CM | POA: Diagnosis not present

## 2017-11-09 DIAGNOSIS — I25810 Atherosclerosis of coronary artery bypass graft(s) without angina pectoris: Secondary | ICD-10-CM | POA: Diagnosis not present

## 2017-11-09 DIAGNOSIS — B351 Tinea unguium: Secondary | ICD-10-CM | POA: Diagnosis not present

## 2017-11-09 LAB — CBC WITH DIFFERENTIAL/PLATELET
Basophils Absolute: 39 cells/uL (ref 0–200)
Basophils Relative: 0.9 %
EOS PCT: 1.2 %
Eosinophils Absolute: 52 cells/uL (ref 15–500)
HEMATOCRIT: 41.6 % (ref 38.5–50.0)
Hemoglobin: 14.3 g/dL (ref 13.2–17.1)
LYMPHS ABS: 1152 {cells}/uL (ref 850–3900)
MCH: 31.4 pg (ref 27.0–33.0)
MCHC: 34.4 g/dL (ref 32.0–36.0)
MCV: 91.4 fL (ref 80.0–100.0)
MPV: 10.4 fL (ref 7.5–12.5)
Monocytes Relative: 12.5 %
NEUTROS PCT: 58.6 %
Neutro Abs: 2520 cells/uL (ref 1500–7800)
PLATELETS: 195 10*3/uL (ref 140–400)
RBC: 4.55 10*6/uL (ref 4.20–5.80)
RDW: 12 % (ref 11.0–15.0)
TOTAL LYMPHOCYTE: 26.8 %
WBC mixed population: 538 cells/uL (ref 200–950)
WBC: 4.3 10*3/uL (ref 3.8–10.8)

## 2017-11-09 LAB — HEPATIC FUNCTION PANEL
AG Ratio: 1.8 (calc) (ref 1.0–2.5)
ALBUMIN MSPROF: 4.1 g/dL (ref 3.6–5.1)
ALT: 23 U/L (ref 9–46)
AST: 21 U/L (ref 10–35)
Alkaline phosphatase (APISO): 50 U/L (ref 40–115)
Bilirubin, Direct: 0.1 mg/dL (ref 0.0–0.2)
Globulin: 2.3 g/dL (calc) (ref 1.9–3.7)
Indirect Bilirubin: 0.3 mg/dL (calc) (ref 0.2–1.2)
TOTAL PROTEIN: 6.4 g/dL (ref 6.1–8.1)
Total Bilirubin: 0.4 mg/dL (ref 0.2–1.2)

## 2017-11-09 MED ORDER — TERBINAFINE HCL 250 MG PO TABS: 250.0000 mg | ORAL_TABLET | Freq: Every day | ORAL | 0 refills | Status: DC

## 2017-11-09 MED ORDER — LOSARTAN POTASSIUM 50 MG PO TABS: 50.0000 mg | ORAL_TABLET | Freq: Every day | ORAL | 3 refills | Status: DC

## 2017-11-09 NOTE — Progress Notes (Signed)
Subjective: Nathan [name] presents the office today for follow up evaluation of onychomycosis.  He has completed 3 months of Lamisil.  He states the nails have gotten bigger but he is still concerned as there is somewhat discolored mostly his second digit toenails and his big toenail.  He denies any pain in the nails and he denies any surrounding redness or drainage or any clinical signs of infection today.  He has no other concerns today.Denies any systemic complaints such as fevers, chills, nausea, vomiting. No acute changes since last appointment, and no other complaints at this time.   Objective: AAO x3, NAD DP/PT pulses palpable bilaterally, CRT less than 3 seconds Nails continue to be somewhat dystrophic, discolored yellow-brown discoloration most notably his hallux and second digit toenails and they do appear to be improved.  There is mild clear along the proximal nail border.  There is no pitting of the nails there is no surrounding redness or drainage or any clinical signs of infection noted today.  There is no open lesions identified bilaterally.  There is no other areas of tenderness identified bilaterally at this time.  There is no overlying edema, erythema to bilateral lower extremities. No pain with calf compression, swelling, warmth, erythema  Assessment: 71 year old male with onychomycosis, with improvement  Plan: -All treatment options discussed with the patient including all alternatives, risks, complications.  -At this point he has completed 3 months of Lamisil his symptoms do continue however it has improved.  We discussed further treatment options today including laser therapy, oral therapy, topical.  After discussion he wishes to proceed with 1 more month of Lamisil.  30 days of Lamisil prescribed.  He has had no side effects so far and continue to monitor.  Did recheck CBC, LFT today. -I will see him back in 2 months.  At that point we will possibly consider laser.  A brochure was  provided on this today.  Celesta Gentile, DPM

## 2017-11-09 NOTE — Telephone Encounter (Signed)
Pt called to report that he will continue on lamisil per podiatrist. He is wishing to start losartan (see Megan Supple's Note from 11/06/17). He will keep follow up as scheduled for 11/23/17 and will get BMET following that visit after starting losartan. Pt states understanding and appreciation.

## 2017-11-10 ENCOUNTER — Telehealth: Payer: Self-pay | Admitting: *Deleted

## 2017-11-10 NOTE — Telephone Encounter (Signed)
I informed Nathan [name] of Dr. Leigh Aurora review of results and orders. Nathan [name] states the pharmacy has called the lamisil is ready to pick up.

## 2017-11-10 NOTE — Telephone Encounter (Signed)
-----   Message from Trula Slade, DPM sent at 11/10/2017  6:24 AM EST ----- OK to finish 30 more days of lamisil. Blood work is normal. MyChart message sent to the patient.

## 2017-11-14 ENCOUNTER — Other Ambulatory Visit: Payer: Medicare Other

## 2017-11-17 DIAGNOSIS — H04123 Dry eye syndrome of bilateral lacrimal glands: Secondary | ICD-10-CM | POA: Diagnosis not present

## 2017-11-17 DIAGNOSIS — D3131 Benign neoplasm of right choroid: Secondary | ICD-10-CM | POA: Diagnosis not present

## 2017-11-17 DIAGNOSIS — H35372 Puckering of macula, left eye: Secondary | ICD-10-CM | POA: Diagnosis not present

## 2017-11-17 DIAGNOSIS — H2513 Age-related nuclear cataract, bilateral: Secondary | ICD-10-CM | POA: Diagnosis not present

## 2017-11-21 DIAGNOSIS — B078 Other viral warts: Secondary | ICD-10-CM | POA: Diagnosis not present

## 2017-11-21 DIAGNOSIS — C44619 Basal cell carcinoma of skin of left upper limb, including shoulder: Secondary | ICD-10-CM | POA: Diagnosis not present

## 2017-11-21 DIAGNOSIS — L820 Inflamed seborrheic keratosis: Secondary | ICD-10-CM | POA: Diagnosis not present

## 2017-11-21 DIAGNOSIS — L570 Actinic keratosis: Secondary | ICD-10-CM | POA: Diagnosis not present

## 2017-11-21 DIAGNOSIS — Z85828 Personal history of other malignant neoplasm of skin: Secondary | ICD-10-CM | POA: Diagnosis not present

## 2017-11-21 DIAGNOSIS — D1801 Hemangioma of skin and subcutaneous tissue: Secondary | ICD-10-CM | POA: Diagnosis not present

## 2017-11-21 DIAGNOSIS — L218 Other seborrheic dermatitis: Secondary | ICD-10-CM | POA: Diagnosis not present

## 2017-11-21 DIAGNOSIS — L814 Other melanin hyperpigmentation: Secondary | ICD-10-CM | POA: Diagnosis not present

## 2017-11-21 DIAGNOSIS — C44719 Basal cell carcinoma of skin of left lower limb, including hip: Secondary | ICD-10-CM | POA: Diagnosis not present

## 2017-11-23 ENCOUNTER — Ambulatory Visit (INDEPENDENT_AMBULATORY_CARE_PROVIDER_SITE_OTHER): Payer: Medicare Other | Admitting: Pharmacist

## 2017-11-23 VITALS — BP 132/62 | HR 82

## 2017-11-23 DIAGNOSIS — I10 Essential (primary) hypertension: Secondary | ICD-10-CM

## 2017-11-23 LAB — BASIC METABOLIC PANEL
BUN / CREAT RATIO: 18 (ref 10–24)
BUN: 15 mg/dL (ref 8–27)
CHLORIDE: 104 mmol/L (ref 96–106)
CO2: 27 mmol/L (ref 20–29)
Calcium: 9.3 mg/dL (ref 8.6–10.2)
Creatinine, Ser: 0.82 mg/dL (ref 0.76–1.27)
GFR, EST AFRICAN AMERICAN: 101 mL/min/{1.73_m2} (ref >59)
GFR, EST NON AFRICAN AMERICAN: 88 mL/min/{1.73_m2} (ref >59)
Glucose: 106 mg/dL — ABNORMAL HIGH (ref 65–99)
POTASSIUM: 4.5 mmol/L (ref 3.5–5.2)
Sodium: 142 mmol/L (ref 134–144)

## 2017-11-23 NOTE — Progress Notes (Signed)
Patient ID: Nathan [name]                 DOB: 28-Sep-1946                      MRN: [id number]     HPI: Nathan [name] is a 71 y.o. male patient of Dr. Tamala Julian well known to pharmacy clinic. PMH is significant for CAD, prior CABG in 2005, HTN, and HLD. He has several medication intolerances, including to beta blockers and statins. At his last visit, pt reported flushing in his cheeks when he took HCTZ. He was not sure if this was due to the medication, drinking wine, or his Lamisil which he started around the same time. He elected to stop his HCTZ since he was going to continue Lamisil for another month and was switched to losartan 50mg  daily. Pt presents today for follow up.  Pt reports today in good spirits. He states that the flushing in his cheeks stopped when he discontinued his HCTZ. He has still been taking his Lamisil and drinking ~2 glasses of wine each day but has not noticed any more flushing. He has been tolerating losartan well without any adverse events. He denies dizziness, blurred vision, falls, or headaches. He has not been checking his BP at home. He took his losartan this morning 2.5 hours ago and has had 1 large cup of coffee this morning.  Current HTN meds: losartan 50mg  daily Previously tried:metoprolol and nebivolol (rash), amlodipine (bloating,GI upset and diarrhea), lisinopril 10mg  daily and 5mg  BID (bloating, GI upset and constipation), HCTZ 12.5mg  3x per week (flushing in cheeks) BP goal:< 140/68mmHg  Family History:Heart disease in mother. Kidney failure and alcohol abuse in brother. Cancer in father.  Social History:Former smoker, quit smoking in 1970. Drinks 2 glasses of wine per day. Denies illicit drug use.  Diet:Eats a lot of salmon and seafood. Cooks at home and rarely eats out. Snacks on granola with fruit. Doesn't add salt to food.  Drinks: 12oz of coffee daily  Exercise:Treadmill for 40 minutes or walks 3-4 miles each day.   Home BP readings: Has  not been checking at home due to worry of it being high. Dr. Brigitte Pulse checked it on 10/24/17 and it was in the 431V systolic. Reports that his diastolic is always in the 70s.  Wt Readings from Last 3 Encounters:  09/11/17 177 lb 6.4 oz (80.5 kg)  10/03/16 180 lb 4 oz (81.8 kg)  09/02/16 176 lb 1.9 oz (79.9 kg)   BP Readings from Last 3 Encounters:  11/06/17 (!) 168/68  09/11/17 (!) 148/86  08/10/17 (!) 153/74   Pulse Readings from Last 3 Encounters:  11/06/17 71  09/11/17 80  08/10/17 79    Renal function: CrCl cannot be calculated (Patient's most recent lab result is older than the maximum 21 days allowed.).  Past Medical History:  Diagnosis Date  . Arthritis   . Blood transfusion without reported diagnosis   . Complication of anesthesia    jittery after surgery  . Coronary artery disease   . GERD (gastroesophageal reflux disease)   . Hyperlipemia   . Hypertension   . Right carpal tunnel syndrome 02/08/2017  . Seasonal allergies   . Wears glasses     Current Outpatient Medications on File Prior to Visit  Medication Sig Dispense Refill  . aspirin EC 81 MG tablet Take 81 mg by mouth daily.    . cetirizine (ZYRTEC) 10 MG tablet Take  10 mg by mouth daily.    . cycloSPORINE (RESTASIS) 0.05 % ophthalmic emulsion Place 1 drop into both eyes 2 (two) times daily.     . Evolocumab (REPATHA SURECLICK) 323 MG/ML SOAJ Inject 1 pen into the skin every 14 (fourteen) days. 2 pen 11  . flurazepam (DALMANE) 30 MG capsule Take 30 mg by mouth at bedtime as needed for sleep.    . hydrochlorothiazide (MICROZIDE) 12.5 MG capsule Take 1 capsule (12.5 mg total) by mouth every Monday, Wednesday, and Friday. 45 capsule 3  . losartan (COZAAR) 50 MG tablet Take 1 tablet (50 mg total) by mouth daily. 90 tablet 3  . nitroGLYCERIN (NITROSTAT) 0.4 MG SL tablet Place 1 tablet (0.4 mg total) under the tongue every 5 (five) minutes as needed for chest pain. 25 tablet prn  . terbinafine (LAMISIL) 250 MG tablet  Take 1 tablet (250 mg total) by mouth daily. 90 tablet 0  . terbinafine (LAMISIL) 250 MG tablet Take 1 tablet (250 mg total) by mouth daily. 30 tablet 0  . triamcinolone (NASACORT) 55 MCG/ACT AERO nasal inhaler Place 2 sprays into the nose daily.    Marland Kitchen triamcinolone cream (KENALOG) 0.1 % APPLY TO AFFECTED AREA ON THE SKIN TWICE DAILY AS NEEDED, FOR SKIN IRRITATION AS DIRECTED  0   No current facility-administered medications on file prior to visit.     Allergies  Allergen Reactions  . Lipitor [Atorvastatin] Rash  . Bystolic [Nebivolol Hcl] Rash  . Lexiscan [Regadenoson] Other (See Comments)    Sharp sternal pain  . Lisinopril     GI upset  . Zetia [Ezetimibe] Diarrhea    GI effects - bloating, cramping, diarrhea on 5mg  and 10mg  dose  . Neosporin [Neomycin-Bacitracin Zn-Polymyx] Rash  . Pravastatin Rash  . Toprol Xl [Metoprolol Tartrate] Rash     Assessment/Plan:  1. Hypertension - BP is much improved since starting losartan 50mg  daily and is now at goal <140/11mmHg. He is tolerating losartan well and his flushing cheeks disappeared after discontinuing HCTZ. Will continue losartan 50mg  daily and check BMET today. F/u in HTN clinic as needed.  Maquita Sandoval E. Keshanna Riso, PharmD, CPP, [facility] [address], [place], [place] [address] Phone: [phone]; Fax: [phone] 11/23/2017 8:59 AM

## 2017-11-23 NOTE — Patient Instructions (Signed)
It was great to see you today - your blood pressure is excellent!  Continue taking your losartan 50mg  once daily for your blood pressure  We will check your lab work today to make sure your kidneys and electrolytes are stable  Follow up in clinic as needed

## 2017-12-19 DIAGNOSIS — M654 Radial styloid tenosynovitis [de Quervain]: Secondary | ICD-10-CM | POA: Insufficient documentation

## 2017-12-20 DIAGNOSIS — M24231 Disorder of ligament, right wrist: Secondary | ICD-10-CM | POA: Diagnosis not present

## 2017-12-20 DIAGNOSIS — M24232 Disorder of ligament, left wrist: Secondary | ICD-10-CM | POA: Diagnosis not present

## 2017-12-20 DIAGNOSIS — G5603 Carpal tunnel syndrome, bilateral upper limbs: Secondary | ICD-10-CM | POA: Diagnosis not present

## 2017-12-20 DIAGNOSIS — M25531 Pain in right wrist: Secondary | ICD-10-CM | POA: Diagnosis not present

## 2017-12-20 DIAGNOSIS — G5601 Carpal tunnel syndrome, right upper limb: Secondary | ICD-10-CM | POA: Diagnosis not present

## 2017-12-20 DIAGNOSIS — M25532 Pain in left wrist: Secondary | ICD-10-CM | POA: Diagnosis not present

## 2018-01-10 DIAGNOSIS — H2513 Age-related nuclear cataract, bilateral: Secondary | ICD-10-CM | POA: Diagnosis not present

## 2018-01-10 DIAGNOSIS — H04123 Dry eye syndrome of bilateral lacrimal glands: Secondary | ICD-10-CM | POA: Diagnosis not present

## 2018-01-10 DIAGNOSIS — D3131 Benign neoplasm of right choroid: Secondary | ICD-10-CM | POA: Diagnosis not present

## 2018-01-10 DIAGNOSIS — H35372 Puckering of macula, left eye: Secondary | ICD-10-CM | POA: Diagnosis not present

## 2018-01-18 ENCOUNTER — Ambulatory Visit (INDEPENDENT_AMBULATORY_CARE_PROVIDER_SITE_OTHER): Payer: Medicare Other | Admitting: Podiatry

## 2018-01-18 ENCOUNTER — Encounter: Payer: Self-pay | Admitting: Podiatry

## 2018-01-18 DIAGNOSIS — B351 Tinea unguium: Secondary | ICD-10-CM

## 2018-01-23 NOTE — Progress Notes (Signed)
Subjective: Nathan [name] presents the office for evaluation of onychomycosis.  He is to use his course of Lamisil.  He states that although he is doing better he still has some discoloration he is possibly interested in laser therapy.  He has all the nails are doing better except for the right big toe.  Denies any pain in the nails and denies any redness or drainage or any swelling. Denies any systemic complaints such as fevers, chills, nausea, vomiting. No acute changes since last appointment, and no other complaints at this time.   Objective: AAO x3, NAD DP/PT pulses palpable bilaterally, CRT less than 3 seconds Nails appear to be somewhat dystrophic, discolored with yellow-brown discoloration however there is evidence of clearing.  The right hallux toenail does appear to be more discolored and mildly hypertrophic compared to the other nails.  There is no pain in the nails there is no surrounding redness or drainage. No open lesions or pre-ulcerative lesions.  No pain with calf compression, swelling, warmth, erythema  Assessment: Onychomycosis  Plan: -All treatment options discussed with the patient including all alternatives, risks, complications.  -We discussed further treatment options.  Before we start with this we will culture the toenail.  I sharply debrided the nails today without any complications or bleeding was sent this for culture to [facility] labs.  Discussed laser therapy will likely do this pending culture results. -Patient encouraged to call the office with any questions, concerns, change in symptoms.   Trula Slade DPM

## 2018-01-24 ENCOUNTER — Telehealth: Payer: Self-pay | Admitting: Pharmacist

## 2018-01-24 NOTE — Telephone Encounter (Signed)
Pt called clinic and states that he has had GI issues with his losartan. He would like to trial off his medication for 3 days. If his symptoms do not improve, he will restart his losartan (BP controlled on 50mg  dose). If his symptoms do improve, he will call clinic and we will try a different medication. He has a history of multiple intolerances to HTN medications.

## 2018-01-29 DIAGNOSIS — M1711 Unilateral primary osteoarthritis, right knee: Secondary | ICD-10-CM | POA: Diagnosis not present

## 2018-01-29 DIAGNOSIS — M545 Low back pain: Secondary | ICD-10-CM | POA: Diagnosis not present

## 2018-01-31 ENCOUNTER — Telehealth: Payer: Self-pay | Admitting: Podiatry

## 2018-01-31 NOTE — Telephone Encounter (Signed)
I saw Dr. Jacqualyn Posey on 07 February. They took some things and sent off for testing. Dr. Jacqualyn Posey was supposed to call me with the results and what the next steps were. I have not heard anything and it will be 2 weeks tomorrow. The best call back number is [phone]. Thank you.

## 2018-01-31 NOTE — Telephone Encounter (Signed)
I spoke with Nathan [name] states they just received the specimen today.

## 2018-01-31 NOTE — Telephone Encounter (Signed)
I informed pt that the testing can take up to 4-6 weeks.

## 2018-02-06 ENCOUNTER — Telehealth: Payer: Self-pay

## 2018-02-06 NOTE — Telephone Encounter (Signed)
-----   Message from Trula Slade, DPM sent at 02/05/2018  8:01 PM EST ----- Please let him know that the culture does still show fungus. He was inquiring about laser and this is an option for him. If he wants to do this please schedule him with Janett Billow to start.

## 2018-02-06 NOTE — Telephone Encounter (Signed)
Spoke with patient, discussing postive nail culture results and laser treatment options. Patient agreed to laser treatment therapy. Transferred to schedulers for appointment

## 2018-02-12 DIAGNOSIS — M1711 Unilateral primary osteoarthritis, right knee: Secondary | ICD-10-CM | POA: Diagnosis not present

## 2018-03-02 ENCOUNTER — Ambulatory Visit (INDEPENDENT_AMBULATORY_CARE_PROVIDER_SITE_OTHER): Payer: Medicare Other

## 2018-03-02 DIAGNOSIS — B351 Tinea unguium: Secondary | ICD-10-CM

## 2018-03-13 NOTE — Progress Notes (Signed)
Pt presents with mycotic infection of nails 1-5 bilateral  All other systems are negative  Laser therapy administered to affected nails and tolerated well. All safety precautions were in place. RE-appointed in 4 weeks for 2nd treatment 

## 2018-04-02 DIAGNOSIS — M25532 Pain in left wrist: Secondary | ICD-10-CM | POA: Diagnosis not present

## 2018-04-02 DIAGNOSIS — G5601 Carpal tunnel syndrome, right upper limb: Secondary | ICD-10-CM | POA: Diagnosis not present

## 2018-04-02 DIAGNOSIS — M24231 Disorder of ligament, right wrist: Secondary | ICD-10-CM | POA: Diagnosis not present

## 2018-04-02 DIAGNOSIS — M65332 Trigger finger, left middle finger: Secondary | ICD-10-CM | POA: Diagnosis not present

## 2018-04-02 DIAGNOSIS — M24232 Disorder of ligament, left wrist: Secondary | ICD-10-CM | POA: Diagnosis not present

## 2018-04-06 ENCOUNTER — Ambulatory Visit: Payer: Self-pay

## 2018-04-06 DIAGNOSIS — B351 Tinea unguium: Secondary | ICD-10-CM

## 2018-04-10 ENCOUNTER — Other Ambulatory Visit: Payer: Self-pay | Admitting: Interventional Cardiology

## 2018-04-11 NOTE — Progress Notes (Signed)
Pt presents with mycotic infection of nails 1-5 bilateral  All other systems are negative  Laser therapy administered to affected nails and tolerated well. All safety precautions were in place. RE-appointed in 4 weeks for 3rd treatment 

## 2018-04-23 DIAGNOSIS — M13839 Other specified arthritis, unspecified wrist: Secondary | ICD-10-CM | POA: Diagnosis not present

## 2018-04-23 DIAGNOSIS — G5601 Carpal tunnel syndrome, right upper limb: Secondary | ICD-10-CM | POA: Diagnosis not present

## 2018-04-23 DIAGNOSIS — M65332 Trigger finger, left middle finger: Secondary | ICD-10-CM | POA: Diagnosis not present

## 2018-05-08 DIAGNOSIS — Z125 Encounter for screening for malignant neoplasm of prostate: Secondary | ICD-10-CM | POA: Diagnosis not present

## 2018-05-08 DIAGNOSIS — I10 Essential (primary) hypertension: Secondary | ICD-10-CM | POA: Diagnosis not present

## 2018-05-08 DIAGNOSIS — R82998 Other abnormal findings in urine: Secondary | ICD-10-CM | POA: Diagnosis not present

## 2018-05-08 DIAGNOSIS — E7849 Other hyperlipidemia: Secondary | ICD-10-CM | POA: Diagnosis not present

## 2018-05-09 ENCOUNTER — Ambulatory Visit (INDEPENDENT_AMBULATORY_CARE_PROVIDER_SITE_OTHER): Payer: Medicare Other

## 2018-05-09 DIAGNOSIS — B351 Tinea unguium: Secondary | ICD-10-CM

## 2018-05-10 DIAGNOSIS — M25561 Pain in right knee: Secondary | ICD-10-CM | POA: Diagnosis not present

## 2018-05-10 DIAGNOSIS — Z1389 Encounter for screening for other disorder: Secondary | ICD-10-CM | POA: Diagnosis not present

## 2018-05-10 DIAGNOSIS — G5601 Carpal tunnel syndrome, right upper limb: Secondary | ICD-10-CM | POA: Diagnosis not present

## 2018-05-10 DIAGNOSIS — I25810 Atherosclerosis of coronary artery bypass graft(s) without angina pectoris: Secondary | ICD-10-CM | POA: Diagnosis not present

## 2018-05-10 DIAGNOSIS — E7849 Other hyperlipidemia: Secondary | ICD-10-CM | POA: Diagnosis not present

## 2018-05-10 DIAGNOSIS — I10 Essential (primary) hypertension: Secondary | ICD-10-CM | POA: Diagnosis not present

## 2018-05-10 DIAGNOSIS — K580 Irritable bowel syndrome with diarrhea: Secondary | ICD-10-CM | POA: Insufficient documentation

## 2018-05-10 DIAGNOSIS — Z0000 Encounter for general adult medical examination without abnormal findings: Secondary | ICD-10-CM | POA: Diagnosis not present

## 2018-05-10 DIAGNOSIS — I700 Atherosclerosis of aorta: Secondary | ICD-10-CM | POA: Insufficient documentation

## 2018-05-10 DIAGNOSIS — Z6827 Body mass index (BMI) 27.0-27.9, adult: Secondary | ICD-10-CM | POA: Diagnosis not present

## 2018-05-10 DIAGNOSIS — R7301 Impaired fasting glucose: Secondary | ICD-10-CM | POA: Insufficient documentation

## 2018-05-11 ENCOUNTER — Telehealth: Payer: Self-pay | Admitting: Pharmacist

## 2018-05-11 NOTE — Telephone Encounter (Signed)
Pt called to report his PCP has checked his cholesterol recently. He has been tolerating Repatha therapy well.  TC 139, TG 114, HDL 57, LDL59. He will fax results to office as well.

## 2018-05-15 DIAGNOSIS — Z1212 Encounter for screening for malignant neoplasm of rectum: Secondary | ICD-10-CM | POA: Diagnosis not present

## 2018-05-15 NOTE — Progress Notes (Signed)
Pt presents with mycotic infection of nails 1-5 bilateral  All other systems are negative  Laser therapy administered to affected nails and tolerated well. All safety precautions were in place. RE-appointed in 4 weeks for 4th treatment 

## 2018-05-16 DIAGNOSIS — M1711 Unilateral primary osteoarthritis, right knee: Secondary | ICD-10-CM | POA: Diagnosis not present

## 2018-05-21 DIAGNOSIS — L821 Other seborrheic keratosis: Secondary | ICD-10-CM | POA: Diagnosis not present

## 2018-05-21 DIAGNOSIS — L723 Sebaceous cyst: Secondary | ICD-10-CM | POA: Diagnosis not present

## 2018-05-21 DIAGNOSIS — D1801 Hemangioma of skin and subcutaneous tissue: Secondary | ICD-10-CM | POA: Diagnosis not present

## 2018-05-21 DIAGNOSIS — Z85828 Personal history of other malignant neoplasm of skin: Secondary | ICD-10-CM | POA: Diagnosis not present

## 2018-05-21 DIAGNOSIS — L570 Actinic keratosis: Secondary | ICD-10-CM | POA: Diagnosis not present

## 2018-05-21 DIAGNOSIS — D225 Melanocytic nevi of trunk: Secondary | ICD-10-CM | POA: Diagnosis not present

## 2018-05-21 DIAGNOSIS — L814 Other melanin hyperpigmentation: Secondary | ICD-10-CM | POA: Diagnosis not present

## 2018-05-21 DIAGNOSIS — L820 Inflamed seborrheic keratosis: Secondary | ICD-10-CM | POA: Diagnosis not present

## 2018-06-05 ENCOUNTER — Other Ambulatory Visit: Payer: Medicare Other

## 2018-06-19 ENCOUNTER — Encounter

## 2018-06-19 ENCOUNTER — Other Ambulatory Visit: Payer: Medicare Other

## 2018-06-21 ENCOUNTER — Ambulatory Visit (INDEPENDENT_AMBULATORY_CARE_PROVIDER_SITE_OTHER): Payer: Medicare Other | Admitting: Podiatry

## 2018-06-21 DIAGNOSIS — G5601 Carpal tunnel syndrome, right upper limb: Secondary | ICD-10-CM | POA: Diagnosis not present

## 2018-06-21 DIAGNOSIS — M13839 Other specified arthritis, unspecified wrist: Secondary | ICD-10-CM | POA: Diagnosis not present

## 2018-06-21 DIAGNOSIS — B351 Tinea unguium: Secondary | ICD-10-CM

## 2018-06-21 DIAGNOSIS — M65332 Trigger finger, left middle finger: Secondary | ICD-10-CM | POA: Diagnosis not present

## 2018-06-25 NOTE — Progress Notes (Signed)
Subjective: 72 year old male presents the office today to help with a second toenails checked.  He has been getting laser therapy for toenail fungus.  He has noticed some blood underneath the toenails of the nails been getting thin.  Denies any pain in the nails and denies any redness or drainage or any swelling. Denies any systemic complaints such as fevers, chills, nausea, vomiting. No acute changes since last appointment, and no other complaints at this time.   Objective: AAO x3, NAD DP/PT pulses palpable bilaterally, CRT less than 3 seconds Bilateral second digit toenails appear to have a new nail coming in and the old nail is starting to grow out.  There is some subungual hematoma although old to the toenails.  This is likely more from microtrauma.  After debridement there is no drainage or pus and I was able to debride a lot of the dried blood.  There is no surrounding edema, erythema, drainage or pus there is no clinical signs of infection. No open lesions or pre-ulcerative lesions.  No pain with calf compression, swelling, warmth, erythema  Assessment: Onychomycosis with onychodystrophy  Plan: -All treatment options discussed with the patient including all alternatives, risks, complications.  -I debrided the bilateral second digit toenail without any complications.  Unfortunately as her eyes broken and we are waiting for her to be fixed and once that comes back for continuation of therapy. -Patient encouraged to call the office with any questions, concerns, change in symptoms.   Trula Slade DPM

## 2018-06-27 DIAGNOSIS — H2513 Age-related nuclear cataract, bilateral: Secondary | ICD-10-CM | POA: Diagnosis not present

## 2018-06-27 DIAGNOSIS — H35372 Puckering of macula, left eye: Secondary | ICD-10-CM | POA: Diagnosis not present

## 2018-06-27 DIAGNOSIS — H04123 Dry eye syndrome of bilateral lacrimal glands: Secondary | ICD-10-CM | POA: Diagnosis not present

## 2018-06-27 DIAGNOSIS — D3131 Benign neoplasm of right choroid: Secondary | ICD-10-CM | POA: Diagnosis not present

## 2018-07-19 DIAGNOSIS — H6123 Impacted cerumen, bilateral: Secondary | ICD-10-CM | POA: Diagnosis not present

## 2018-07-26 DIAGNOSIS — L638 Other alopecia areata: Secondary | ICD-10-CM | POA: Diagnosis not present

## 2018-07-26 DIAGNOSIS — Z85828 Personal history of other malignant neoplasm of skin: Secondary | ICD-10-CM | POA: Diagnosis not present

## 2018-09-10 DIAGNOSIS — M65332 Trigger finger, left middle finger: Secondary | ICD-10-CM | POA: Diagnosis not present

## 2018-09-13 DIAGNOSIS — H04123 Dry eye syndrome of bilateral lacrimal glands: Secondary | ICD-10-CM | POA: Insufficient documentation

## 2018-09-13 DIAGNOSIS — D3131 Benign neoplasm of right choroid: Secondary | ICD-10-CM | POA: Insufficient documentation

## 2018-09-13 DIAGNOSIS — H33312 Horseshoe tear of retina without detachment, left eye: Secondary | ICD-10-CM | POA: Diagnosis not present

## 2018-09-13 DIAGNOSIS — H43812 Vitreous degeneration, left eye: Secondary | ICD-10-CM | POA: Diagnosis not present

## 2018-09-13 DIAGNOSIS — H35372 Puckering of macula, left eye: Secondary | ICD-10-CM | POA: Insufficient documentation

## 2018-09-13 DIAGNOSIS — H35373 Puckering of macula, bilateral: Secondary | ICD-10-CM | POA: Diagnosis not present

## 2018-09-13 DIAGNOSIS — H2513 Age-related nuclear cataract, bilateral: Secondary | ICD-10-CM | POA: Insufficient documentation

## 2018-09-17 DIAGNOSIS — Z6827 Body mass index (BMI) 27.0-27.9, adult: Secondary | ICD-10-CM | POA: Diagnosis not present

## 2018-09-17 DIAGNOSIS — R05 Cough: Secondary | ICD-10-CM | POA: Diagnosis not present

## 2018-09-29 ENCOUNTER — Other Ambulatory Visit: Payer: Self-pay | Admitting: Interventional Cardiology

## 2018-10-01 NOTE — Progress Notes (Signed)
Cardiology Office Note:    Date:  10/02/2018   ID:  Nathan [name], DOB 1945-12-20, MRN [id number]  PCP:  Nathan Redwood, MD  Cardiologist:  No primary care provider on file.   Referring MD: Nathan Redwood, MD   Chief Complaint  Patient presents with  . Coronary Artery Disease    History of Present Illness:    Nathan [name] is a 72 y.o. male with a hx of CAD, prior CABG 2003, hypertension, hyperlipidemia, and family history of CAD.Now having increasing difficulty with medication intolerances including beta blockers, statins, and he feels Lexiscan also cause chest pain.  He is doing well.  He denies angina.  He is exercising regularly.  He is compliant with medications.  No claudication, angina, dyspnea, swelling, orthopnea, or PND.  Past Medical History:  Diagnosis Date  . Arthritis   . Blood transfusion without reported diagnosis   . Complication of anesthesia    jittery after surgery  . Coronary artery disease   . GERD (gastroesophageal reflux disease)   . Hyperlipemia   . Hypertension   . Right carpal tunnel syndrome 02/08/2017  . Seasonal allergies   . Wears glasses     Past Surgical History:  Procedure Laterality Date  . APPENDECTOMY     age 23  . COLONOSCOPY    . COLONOSCOPY WITH PROPOFOL N/A 02/15/2016   Procedure: COLONOSCOPY WITH PROPOFOL;  Surgeon: Garlan Fair, MD;  Location: [facility];  Service: Endoscopy;  Laterality: N/A;  . CORONARY ARTERY BYPASS GRAFT     2003  . ESOPHAGOGASTRODUODENOSCOPY (EGD) WITH PROPOFOL N/A 02/15/2016   Procedure: ESOPHAGOGASTRODUODENOSCOPY (EGD) WITH PROPOFOL;  Surgeon: Garlan Fair, MD;  Location: [facility];  Service: Endoscopy;  Laterality: N/A;  . EXPLORATION POST OPERATIVE OPEN HEART    . EYE SURGERY     detached retina-lt  . INGUINAL HERNIA REPAIR Left 04/01/2014   Procedure: HERNIA REPAIR INGUINAL ADULT;  Surgeon: Shann Medal, MD;  Location: [facility];  Service: General;  Laterality: Left;  .  INSERTION OF MESH Left 04/01/2014   Procedure: INSERTION OF MESH;  Surgeon: Shann Medal, MD;  Location: [facility];  Service: General;  Laterality: Left;  . KNEE ARTHROCENTESIS  1980   right  . SHOULDER ACROMIOPLASTY  1985   rt  . SINUS ENDO W/FUSION    . TONSILLECTOMY AND ADENOIDECTOMY      Current Medications: Current Meds  Medication Sig  . aspirin EC 81 MG tablet Take 81 mg by mouth daily.  . cetirizine (ZYRTEC) 10 MG tablet Take 10 mg by mouth daily.  . cycloSPORINE (RESTASIS) 0.05 % ophthalmic emulsion Place 1 drop into both eyes 2 (two) times daily.   . flurazepam (DALMANE) 30 MG capsule Take 30 mg by mouth at bedtime as needed for sleep.  Marland Kitchen losartan (COZAAR) 50 MG tablet Take 1 tablet (50 mg total) by mouth daily.  . nitroGLYCERIN (NITROSTAT) 0.4 MG SL tablet Place 1 tablet (0.4 mg total) under the tongue every 5 (five) minutes as needed for chest pain.  Marland Kitchen REPATHA SURECLICK 322 MG/ML SOAJ INJECT 1 PEN UNDER THE SKIN (SUBCUTANEOUS INJECTION) EVERY 14 DAYS  . triamcinolone (NASACORT) 55 MCG/ACT AERO nasal inhaler Place 2 sprays into the nose daily.  Marland Kitchen triamcinolone cream (KENALOG) 0.1 % APPLY TO AFFECTED AREA ON THE SKIN TWICE DAILY AS NEEDED, FOR SKIN IRRITATION AS DIRECTED     Allergies:   Lipitor [atorvastatin]; Bystolic [nebivolol hcl]; Lexiscan [regadenoson]; Lisinopril;  Zetia [ezetimibe]; Neosporin [neomycin-bacitracin zn-polymyx]; Pravastatin; and Toprol xl [metoprolol tartrate]   Social History   Socioeconomic History  . Marital status: Single    Spouse name: Not on file  . Number of children: Not on file  . Years of education: post grad  . Highest education level: Not on file  Occupational History  . Occupation: [employer]  Social Needs  . Financial resource strain: Not on file  . Food insecurity:    Worry: Not on file    Inability: Not on file  . Transportation needs:    Medical: Not on file    Non-medical: Not on file  Tobacco Use  . Smoking  status: Former Smoker    Last attempt to quit: 03/26/1969    Years since quitting: 49.5  . Smokeless tobacco: Never Used  Substance and Sexual Activity  . Alcohol use: Yes    Alcohol/week: 0.0 standard drinks    Comment: 2 per day  . Drug use: No  . Sexual activity: Not on file  Lifestyle  . Physical activity:    Days per week: Not on file    Minutes per session: Not on file  . Stress: Not on file  Relationships  . Social connections:    Talks on phone: Not on file    Gets together: Not on file    Attends religious service: Not on file    Active member of club or organization: Not on file    Attends meetings of clubs or organizations: Not on file    Relationship status: Not on file  Other Topics Concern  . Not on file  Social History Narrative   Patient drinks 1-2 cups of caffeine daily.   Patient is left handed.      Family History: The patient's family history includes Alcohol abuse in his brother; Cancer in his father; Heart disease in his mother; Kidney failure in his brother.  ROS:   Please see the history of present illness.    Upper respiratory and sinus congestion.  This is a relatively recent issue that is improving.  Otherwise no complaints.  All other systems reviewed and are negative.  EKGs/Labs/Other Studies Reviewed:    The following studies were reviewed today: Reviewed laboratory data from primary physician, Dr. Lang [name].  A1c was less than 6.  LDL cholesterol was 59.  EKG:  EKG is  ordered today.  The ekg ordered today demonstrates normal sinus rhythm with left atrial abnormality..  There is no change when compared to prior tracings.  Recent Labs: 11/09/2017: ALT 23; Hemoglobin 14.3; Platelets 195 11/23/2017: BUN 15; Creatinine, Ser 0.82; Potassium 4.5; Sodium 142  Recent Lipid Panel    Component Value Date/Time   CHOL 144 09/06/2017 0801   TRIG 111 09/06/2017 0801   HDL 70 09/06/2017 0801   CHOLHDL 2.1 09/06/2017 0801   CHOLHDL 1.8 08/08/2016  0810   VLDL 16 08/08/2016 0810   LDLCALC 52 09/06/2017 0801    Physical Exam:    VS:  BP 132/74   Pulse 80   Ht 5' 8.5" (1.74 m)   Wt 181 lb (82.1 kg)   BMI 27.12 kg/m     Wt Readings from Last 3 Encounters:  10/02/18 181 lb (82.1 kg)  09/11/17 177 lb 6.4 oz (80.5 kg)  10/03/16 180 lb 4 oz (81.8 kg)     GEN:  Well nourished, well developed in no acute distress HEENT: Normal NECK: No JVD. LYMPHATICS: No lymphadenopathy CARDIAC: RRR, no murmur,  S4 gallop, no edema. VASCULAR: no pulses. no bruits. RESPIRATORY:  Clear to auscultation without rales, wheezing or rhonchi  ABDOMEN: Soft, non-tender, non-distended, No pulsatile mass, MUSCULOSKELETAL: No deformity  SKIN: Warm and dry NEUROLOGIC:  Alert and oriented x 3 PSYCHIATRIC:  Normal affect   ASSESSMENT:    1. Coronary artery disease involving coronary bypass graft of native heart without angina pectoris   2. Other hyperlipidemia   3. Essential hypertension    PLAN:    In order of problems listed above:  1. Patient is stable.  He denies angina.  No cardiovascular complaints.  Secondary risk prevention is on target in all areas including exercise, glycemic control, lipids, weight, and blood pressure. 2. Continue PCSK9 therapy.  LDL is at target. 3. Blood pressure target 130/80 mmHg or less.  Low-salt diet.  Aerobic activity.  Plan clinical follow-up in 1 year.  Affirmed the positive nature of risk factor modification in his case.  No changes are required.  Clinical follow-up in 1 year.   Medication Adjustments/Labs and Tests Ordered: Current medicines are reviewed at length with the patient today.  Concerns regarding medicines are outlined above.  No orders of the defined types were placed in this encounter.  No orders of the defined types were placed in this encounter.   There are no Patient Instructions on file for this visit.   Signed, Sinclair Grooms, MD  10/02/2018 8:15 AM    [place]

## 2018-10-02 ENCOUNTER — Ambulatory Visit (INDEPENDENT_AMBULATORY_CARE_PROVIDER_SITE_OTHER): Payer: Medicare Other | Admitting: Interventional Cardiology

## 2018-10-02 ENCOUNTER — Other Ambulatory Visit: Payer: Self-pay | Admitting: Interventional Cardiology

## 2018-10-02 ENCOUNTER — Encounter: Payer: Self-pay | Admitting: Interventional Cardiology

## 2018-10-02 VITALS — BP 132/74 | HR 80 | Ht 68.5 in | Wt 181.0 lb

## 2018-10-02 DIAGNOSIS — I10 Essential (primary) hypertension: Secondary | ICD-10-CM

## 2018-10-02 DIAGNOSIS — I25810 Atherosclerosis of coronary artery bypass graft(s) without angina pectoris: Secondary | ICD-10-CM | POA: Diagnosis not present

## 2018-10-02 DIAGNOSIS — E7849 Other hyperlipidemia: Secondary | ICD-10-CM | POA: Diagnosis not present

## 2018-10-02 NOTE — Patient Instructions (Signed)

## 2018-11-07 DIAGNOSIS — D1801 Hemangioma of skin and subcutaneous tissue: Secondary | ICD-10-CM | POA: Diagnosis not present

## 2018-11-07 DIAGNOSIS — M79644 Pain in right finger(s): Secondary | ICD-10-CM | POA: Diagnosis not present

## 2018-11-07 DIAGNOSIS — D225 Melanocytic nevi of trunk: Secondary | ICD-10-CM | POA: Diagnosis not present

## 2018-11-07 DIAGNOSIS — L300 Nummular dermatitis: Secondary | ICD-10-CM | POA: Diagnosis not present

## 2018-11-07 DIAGNOSIS — L570 Actinic keratosis: Secondary | ICD-10-CM | POA: Diagnosis not present

## 2018-11-07 DIAGNOSIS — Z85828 Personal history of other malignant neoplasm of skin: Secondary | ICD-10-CM | POA: Diagnosis not present

## 2018-11-07 DIAGNOSIS — L638 Other alopecia areata: Secondary | ICD-10-CM | POA: Diagnosis not present

## 2018-11-07 DIAGNOSIS — L821 Other seborrheic keratosis: Secondary | ICD-10-CM | POA: Diagnosis not present

## 2018-11-07 DIAGNOSIS — G5601 Carpal tunnel syndrome, right upper limb: Secondary | ICD-10-CM | POA: Diagnosis not present

## 2018-11-07 DIAGNOSIS — M1811 Unilateral primary osteoarthritis of first carpometacarpal joint, right hand: Secondary | ICD-10-CM | POA: Diagnosis not present

## 2018-11-07 DIAGNOSIS — M189 Osteoarthritis of first carpometacarpal joint, unspecified: Secondary | ICD-10-CM | POA: Insufficient documentation

## 2018-11-07 DIAGNOSIS — M24231 Disorder of ligament, right wrist: Secondary | ICD-10-CM | POA: Diagnosis not present

## 2018-11-07 DIAGNOSIS — L111 Transient acantholytic dermatosis [Grover]: Secondary | ICD-10-CM | POA: Diagnosis not present

## 2018-11-21 DIAGNOSIS — G5603 Carpal tunnel syndrome, bilateral upper limbs: Secondary | ICD-10-CM | POA: Diagnosis not present

## 2018-11-21 DIAGNOSIS — M24232 Disorder of ligament, left wrist: Secondary | ICD-10-CM | POA: Diagnosis not present

## 2018-11-21 DIAGNOSIS — M1811 Unilateral primary osteoarthritis of first carpometacarpal joint, right hand: Secondary | ICD-10-CM | POA: Diagnosis not present

## 2018-11-21 DIAGNOSIS — M24231 Disorder of ligament, right wrist: Secondary | ICD-10-CM | POA: Diagnosis not present

## 2018-12-11 DIAGNOSIS — Z6827 Body mass index (BMI) 27.0-27.9, adult: Secondary | ICD-10-CM | POA: Diagnosis not present

## 2018-12-11 DIAGNOSIS — G4709 Other insomnia: Secondary | ICD-10-CM | POA: Diagnosis not present

## 2018-12-11 DIAGNOSIS — G4700 Insomnia, unspecified: Secondary | ICD-10-CM | POA: Insufficient documentation

## 2019-01-30 DIAGNOSIS — H2513 Age-related nuclear cataract, bilateral: Secondary | ICD-10-CM | POA: Diagnosis not present

## 2019-01-30 DIAGNOSIS — H04123 Dry eye syndrome of bilateral lacrimal glands: Secondary | ICD-10-CM | POA: Diagnosis not present

## 2019-01-30 DIAGNOSIS — H35372 Puckering of macula, left eye: Secondary | ICD-10-CM | POA: Diagnosis not present

## 2019-01-30 DIAGNOSIS — D3131 Benign neoplasm of right choroid: Secondary | ICD-10-CM | POA: Diagnosis not present

## 2019-02-07 DIAGNOSIS — M25832 Other specified joint disorders, left wrist: Secondary | ICD-10-CM | POA: Diagnosis not present

## 2019-02-07 DIAGNOSIS — G5601 Carpal tunnel syndrome, right upper limb: Secondary | ICD-10-CM | POA: Diagnosis not present

## 2019-02-07 DIAGNOSIS — M65332 Trigger finger, left middle finger: Secondary | ICD-10-CM | POA: Diagnosis not present

## 2019-02-07 DIAGNOSIS — M24231 Disorder of ligament, right wrist: Secondary | ICD-10-CM | POA: Diagnosis not present

## 2019-02-07 DIAGNOSIS — M25531 Pain in right wrist: Secondary | ICD-10-CM | POA: Diagnosis not present

## 2019-02-12 ENCOUNTER — Other Ambulatory Visit: Payer: Self-pay | Admitting: Pharmacist

## 2019-02-12 MED ORDER — LOSARTAN POTASSIUM 25 MG PO TABS: 50.0000 mg | ORAL_TABLET | Freq: Every day | ORAL | 3 refills | Status: DC

## 2019-02-13 ENCOUNTER — Other Ambulatory Visit: Payer: Self-pay | Admitting: Interventional Cardiology

## 2019-02-13 NOTE — Telephone Encounter (Signed)
PHARMACY STATES ALL LOSARTANS ARE ON BACKORDER TILL LATE MAY. PLEASE SEND IN ALTERNATIVE!   THANKS!

## 2019-02-14 NOTE — Telephone Encounter (Signed)
Ok to switch pt to Valsartan 80mg  QD as suggested by the pharmacy?

## 2019-02-14 NOTE — Telephone Encounter (Signed)
Would be okay to switch to valsartan.  80 mg dose sounds reasonable.

## 2019-02-18 DIAGNOSIS — M1711 Unilateral primary osteoarthritis, right knee: Secondary | ICD-10-CM | POA: Diagnosis not present

## 2019-02-19 ENCOUNTER — Telehealth: Payer: Self-pay | Admitting: Pharmacist

## 2019-02-19 MED ORDER — LOSARTAN POTASSIUM 25 MG PO TABS: 50.0000 mg | ORAL_TABLET | Freq: Every day | ORAL | 3 refills | Status: DC

## 2019-02-19 NOTE — Telephone Encounter (Signed)
[facility] is on short supply at [facility]. He states that [facility] does have losartan and can get from there.   He has been taking losartan 25mg  - 2 tablet daily (50mg  daily). He confirmed that mail order has the 25 mg.   RX refill sent for 90 day supply as requested.

## 2019-02-21 ENCOUNTER — Other Ambulatory Visit: Payer: Self-pay

## 2019-02-21 MED ORDER — LOSARTAN POTASSIUM 25 MG PO TABS: 50.0000 mg | ORAL_TABLET | Freq: Every day | ORAL | 1 refills | Status: DC

## 2019-02-26 ENCOUNTER — Other Ambulatory Visit: Payer: Self-pay | Admitting: Pharmacist

## 2019-02-26 MED ORDER — EVOLOCUMAB 140 MG/ML ~~LOC~~ SOAJ: 1.0000 "pen " | SUBCUTANEOUS | 3 refills | Status: DC

## 2019-05-01 DIAGNOSIS — L638 Other alopecia areata: Secondary | ICD-10-CM | POA: Diagnosis not present

## 2019-05-01 DIAGNOSIS — L218 Other seborrheic dermatitis: Secondary | ICD-10-CM | POA: Diagnosis not present

## 2019-05-01 DIAGNOSIS — D2262 Melanocytic nevi of left upper limb, including shoulder: Secondary | ICD-10-CM | POA: Diagnosis not present

## 2019-05-01 DIAGNOSIS — L738 Other specified follicular disorders: Secondary | ICD-10-CM | POA: Diagnosis not present

## 2019-05-01 DIAGNOSIS — Z85828 Personal history of other malignant neoplasm of skin: Secondary | ICD-10-CM | POA: Diagnosis not present

## 2019-05-01 DIAGNOSIS — D2261 Melanocytic nevi of right upper limb, including shoulder: Secondary | ICD-10-CM | POA: Diagnosis not present

## 2019-05-01 DIAGNOSIS — D1801 Hemangioma of skin and subcutaneous tissue: Secondary | ICD-10-CM | POA: Diagnosis not present

## 2019-05-01 DIAGNOSIS — D2239 Melanocytic nevi of other parts of face: Secondary | ICD-10-CM | POA: Diagnosis not present

## 2019-05-01 DIAGNOSIS — D485 Neoplasm of uncertain behavior of skin: Secondary | ICD-10-CM | POA: Diagnosis not present

## 2019-05-01 DIAGNOSIS — L821 Other seborrheic keratosis: Secondary | ICD-10-CM | POA: Diagnosis not present

## 2019-05-01 DIAGNOSIS — D225 Melanocytic nevi of trunk: Secondary | ICD-10-CM | POA: Diagnosis not present

## 2019-05-01 DIAGNOSIS — L570 Actinic keratosis: Secondary | ICD-10-CM | POA: Diagnosis not present

## 2019-05-02 DIAGNOSIS — M1811 Unilateral primary osteoarthritis of first carpometacarpal joint, right hand: Secondary | ICD-10-CM | POA: Diagnosis not present

## 2019-05-02 DIAGNOSIS — G5601 Carpal tunnel syndrome, right upper limb: Secondary | ICD-10-CM | POA: Diagnosis not present

## 2019-05-02 DIAGNOSIS — M25531 Pain in right wrist: Secondary | ICD-10-CM | POA: Diagnosis not present

## 2019-05-02 DIAGNOSIS — M65332 Trigger finger, left middle finger: Secondary | ICD-10-CM | POA: Diagnosis not present

## 2019-05-08 DIAGNOSIS — R7301 Impaired fasting glucose: Secondary | ICD-10-CM | POA: Diagnosis not present

## 2019-05-08 DIAGNOSIS — E7849 Other hyperlipidemia: Secondary | ICD-10-CM | POA: Diagnosis not present

## 2019-05-08 DIAGNOSIS — Z125 Encounter for screening for malignant neoplasm of prostate: Secondary | ICD-10-CM | POA: Diagnosis not present

## 2019-05-09 DIAGNOSIS — I10 Essential (primary) hypertension: Secondary | ICD-10-CM | POA: Diagnosis not present

## 2019-05-09 DIAGNOSIS — R82998 Other abnormal findings in urine: Secondary | ICD-10-CM | POA: Diagnosis not present

## 2019-05-15 DIAGNOSIS — I25810 Atherosclerosis of coronary artery bypass graft(s) without angina pectoris: Secondary | ICD-10-CM | POA: Diagnosis not present

## 2019-05-15 DIAGNOSIS — G4700 Insomnia, unspecified: Secondary | ICD-10-CM | POA: Diagnosis not present

## 2019-05-15 DIAGNOSIS — Z86010 Personal history of colonic polyps: Secondary | ICD-10-CM | POA: Diagnosis not present

## 2019-05-15 DIAGNOSIS — R7301 Impaired fasting glucose: Secondary | ICD-10-CM | POA: Diagnosis not present

## 2019-05-15 DIAGNOSIS — I700 Atherosclerosis of aorta: Secondary | ICD-10-CM | POA: Diagnosis not present

## 2019-05-15 DIAGNOSIS — I10 Essential (primary) hypertension: Secondary | ICD-10-CM | POA: Diagnosis not present

## 2019-05-15 DIAGNOSIS — Z0000 Encounter for general adult medical examination without abnormal findings: Secondary | ICD-10-CM | POA: Diagnosis not present

## 2019-05-15 DIAGNOSIS — M79643 Pain in unspecified hand: Secondary | ICD-10-CM | POA: Diagnosis not present

## 2019-05-15 DIAGNOSIS — G5601 Carpal tunnel syndrome, right upper limb: Secondary | ICD-10-CM | POA: Diagnosis not present

## 2019-05-15 DIAGNOSIS — Z1331 Encounter for screening for depression: Secondary | ICD-10-CM | POA: Diagnosis not present

## 2019-05-15 DIAGNOSIS — E785 Hyperlipidemia, unspecified: Secondary | ICD-10-CM | POA: Diagnosis not present

## 2019-05-15 DIAGNOSIS — M25561 Pain in right knee: Secondary | ICD-10-CM | POA: Diagnosis not present

## 2019-07-10 DIAGNOSIS — M1711 Unilateral primary osteoarthritis, right knee: Secondary | ICD-10-CM | POA: Diagnosis not present

## 2019-07-22 DIAGNOSIS — I25810 Atherosclerosis of coronary artery bypass graft(s) without angina pectoris: Secondary | ICD-10-CM | POA: Diagnosis not present

## 2019-07-22 DIAGNOSIS — K30 Functional dyspepsia: Secondary | ICD-10-CM | POA: Diagnosis not present

## 2019-07-22 DIAGNOSIS — L0889 Other specified local infections of the skin and subcutaneous tissue: Secondary | ICD-10-CM | POA: Diagnosis not present

## 2019-07-22 DIAGNOSIS — I119 Hypertensive heart disease without heart failure: Secondary | ICD-10-CM | POA: Diagnosis not present

## 2019-07-22 DIAGNOSIS — M25561 Pain in right knee: Secondary | ICD-10-CM | POA: Diagnosis not present

## 2019-07-29 ENCOUNTER — Telehealth: Payer: Self-pay | Admitting: Interventional Cardiology

## 2019-07-29 NOTE — Telephone Encounter (Signed)
New message    Pt c/o BP issue: STAT if pt c/o blurred vision, one-sided weakness or slurred speech  1. What are your last 5 BP readings? 178/76, no other readings  2. Are you having any other symptoms (ex. Dizziness, headache, blurred vision, passed out)? no  3. What is your BP issue? Patient calling to report BP was high at his last PCP office visit. Patient is not requesting a call back from nurse. Appointment made for 9/3 with APP

## 2019-08-08 DIAGNOSIS — M25531 Pain in right wrist: Secondary | ICD-10-CM | POA: Diagnosis not present

## 2019-08-08 DIAGNOSIS — M79644 Pain in right finger(s): Secondary | ICD-10-CM | POA: Diagnosis not present

## 2019-08-08 DIAGNOSIS — M1811 Unilateral primary osteoarthritis of first carpometacarpal joint, right hand: Secondary | ICD-10-CM | POA: Diagnosis not present

## 2019-08-08 DIAGNOSIS — G5601 Carpal tunnel syndrome, right upper limb: Secondary | ICD-10-CM | POA: Diagnosis not present

## 2019-08-13 DIAGNOSIS — Z23 Encounter for immunization: Secondary | ICD-10-CM | POA: Diagnosis not present

## 2019-08-13 NOTE — Progress Notes (Signed)
Cardiology Office Note   Date:  08/15/2019   ID:  Nathan [name] 04-11-1946, MRN [id number]  PCP:  Marton Redwood, MD  Cardiologist:  Dr. Tamala Julian     Chief Complaint  Patient presents with   Hypertension     History of Present Illness: [name] is a 73 year old male with a past medical history of CAD s/p CABG in 2005 with low risk Myoview in 2016, hypertension, hyperlipidemia, and multiple medication intolerances who is followed by Dr. Tamala Julian and presents today for concerns about hypertension.   Patient last seen by Dr. Tamala Julian in 09/2018 at which time he was doing well from a cardiac standpoint. He was exercising regularly and denied any angina, dyspnea, swelling, orthopnea, PND, or claudication.   Per telephone note on 07/29/2019, patient's BP was elevated at recent visit with PCP. Patient currently on Losartan 50mg  daily. Patient has tried multiple antihypertensive medications in the past and has unfortunately been unable to tolerate many of them.   Previously tried:metoprolol and nebivolol (rash), amlodipine(bloating,GI upset and diarrhea), lisinopril 10mg  daily and 5mg  BID(bloating, GI upset and constipation), HCTZ 12.5mg  3x per week (flushing in cheeks).   Patient presents today for evaluation of hypertension and recent chest discomfort. Patient doing well today. However, he reports 2 weeks of constant chest heaviness about 1 month ago. Chest pain was not worse with exertion during that time. Patient states discomfort is similar to symptoms prior to CABG but felt like it was probably just his reflux so he scheduled a visit with his PCP.  Patient saw his PCP on 07/22/2019 and was prescribed an antacid for 14 days. Chest heaviness completely resolved with this. He has not had any chest pain since that time. Patient remains very active walking 3 miles every day 7 days per week. He denies any chest pain with this. No shortness of breath, diaphoresis, nausea, vomiting,  palpitations, lightheadedness, dizziness, syncope, orthopnea, PND, or edema. Patient does note that he bleeds easily when he scratches himself but is able to stop the bleeding by applying a band-aid. No hematochezia, melena, hematuria, hemoptysis, or hematemesis. BP was noted to be significant elevated (178/76) at visit PCP. 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 Therefore, he called our office to schedule an appointment.   Past Medical History:  Diagnosis Date   Arthritis    Blood transfusion without reported diagnosis    Complication of anesthesia    jittery after surgery   Coronary artery disease    GERD (gastroesophageal reflux disease)    Hyperlipemia    Hypertension    Right carpal tunnel syndrome 02/08/2017   Seasonal allergies    Wears glasses     Past Surgical History:  Procedure Laterality Date   APPENDECTOMY     age 54   COLONOSCOPY     COLONOSCOPY WITH PROPOFOL N/A 02/15/2016   Procedure: COLONOSCOPY WITH PROPOFOL;  Surgeon: Garlan Fair, MD;  Location: [facility];  Service: Endoscopy;  Laterality: N/A;   CORONARY ARTERY BYPASS GRAFT     2003   ESOPHAGOGASTRODUODENOSCOPY (EGD) WITH PROPOFOL N/A 02/15/2016   Procedure: ESOPHAGOGASTRODUODENOSCOPY (EGD) WITH PROPOFOL;  Surgeon: Garlan Fair, MD;  Location: [facility];  Service: Endoscopy;  Laterality: N/A;   EXPLORATION POST OPERATIVE OPEN HEART     EYE SURGERY     detached retina-lt   INGUINAL HERNIA REPAIR Left 04/01/2014   Procedure: HERNIA REPAIR INGUINAL ADULT;  Surgeon: Fenton Malling  Lucia Gaskins, MD;  Location: [facility] [place];  Service: General;  Laterality: Left;   INSERTION OF MESH Left 04/01/2014   Procedure: INSERTION OF MESH;  Surgeon: Shann Medal, MD;  Location: [facility];  Service: General;  Laterality: Left;   KNEE ARTHROCENTESIS  1980   right   SHOULDER ACROMIOPLASTY  1985   rt   SINUS ENDO W/FUSION      TONSILLECTOMY AND ADENOIDECTOMY       Current Outpatient Medications  Medication Sig Dispense Refill   aspirin EC 81 MG tablet Take 81 mg by mouth daily.     cetirizine (ZYRTEC) 10 MG tablet Take 10 mg by mouth daily.     cycloSPORINE (RESTASIS) 0.05 % ophthalmic emulsion Place 1 drop into both eyes 2 (two) times daily.      Evolocumab (REPATHA SURECLICK) XX123456 MG/ML SOAJ Inject 1 pen into the skin every 14 (fourteen) days. 6 pen 3   flurazepam (DALMANE) 30 MG capsule Take 30 mg by mouth at bedtime as needed for sleep.     ketoconazole (NIZORAL) 2 % cream Use as directed     losartan (COZAAR) 25 MG tablet Take 2 tablets (50 mg total) by mouth daily. 180 tablet 1   triamcinolone (NASACORT) 55 MCG/ACT AERO nasal inhaler Place 2 sprays into the nose daily.     nitroGLYCERIN (NITROSTAT) 0.4 MG SL tablet Place 1 tablet (0.4 mg total) under the tongue every 5 (five) minutes as needed for chest pain. 25 tablet 3   No current facility-administered medications for this visit.     Allergies:   Lipitor [atorvastatin], Bystolic [nebivolol hcl], Lexiscan [regadenoson], Amlodipine, Lisinopril, Zetia [ezetimibe], Neosporin [neomycin-bacitracin zn-polymyx], Pravastatin, and Toprol xl [metoprolol tartrate]    Social History:  The patient  reports that he quit smoking about 50 years ago. He has never used smokeless tobacco. He reports current alcohol use. He reports that he does not use drugs.   Family History:  The patient's family history includes Alcohol abuse in his brother; Cancer in his father; Heart disease in his mother; Kidney failure in his brother.    ROS:   Please see history of present illness. Other systems reviewed and negative.   Wt Readings from Last 3 Encounters:  08/15/19 182 lb 1.9 oz (82.6 kg)  10/02/18 181 lb (82.1 kg)  09/11/17 177 lb 6.4 oz (80.5 kg)     PHYSICAL EXAM: Vital Signs:  BP 124/64    Pulse 79    Ht 5' 8.5" (1.74 m)    Wt 182 lb 1.9 oz (82.6 kg)    SpO2  98%    BMI 27.29 kg/m  , BMI Body mass index is 27.29 kg/m. General: 73 y.o. male resting comfortably in no acute distress. HEENT: Normocephalic and atraumatic. Sclera clear.  Neck: Supple. No JVD. Heart: RRR. Distinct S1 and S2. Soft II/VI systolic murmur noted best heard at upper sternal border. No gallops or rubs. Radial pulses 2+ and equal bilaterally. Lungs: No increased work of breathing. Clear to ausculation bilaterally. No wheezes, rhonchi, or rales.  Abdomen: Soft, non-distended, and non-tender to palpation.  MSK: Normal strength and tone for age. Extremities: No clubbing, cyanosis, or edema.    Skin: Warm and dry. Neuro: Alert and oriented x3. No focal deficits. Psych: Normal affect. Responds appropriately.   EKG:  EKG was ordered today. EKG was personally reviewed and demonstrates normal sinus rhythm, rate 79 bpm, with LVH and non-specific ST/T changes but no significant changes from  prior tracing in 2018.  Recent Labs: No results found for requested labs within last 8760 hours.    Lipid Panel    Component Value Date/Time   CHOL 144 09/06/2017 0801   TRIG 111 09/06/2017 0801   HDL 70 09/06/2017 0801   CHOLHDL 2.1 09/06/2017 0801   CHOLHDL 1.8 08/08/2016 0810   VLDL 16 08/08/2016 0810   LDLCALC 52 09/06/2017 0801    Other studies Reviewed: Additional studies/ records that were reviewed today include:   Myoview 07/15/2015:  Nuclear stress EF: 63%.  There was no ST segment deviation noted during stress.  The study is normal.  This is a low risk study.  The left ventricular ejection fraction is normal (55-65%).   Normal nuclear study with no prior scar or ischemia.   ASSESSMENT AND PLAN:  1. Chest Discomfort - Patient reports 2 weeks of constant chest heaviness about 1 month ago that resolved after PCP prescribed 14 days of antacid. Pain was not worse with exertion. Patient very activity and walk 3 miles every day and denies any symptoms with this. However,  discomfort similar to symptoms prior to CABG in 2005. Patient has been chest pain free for the past 3 weeks.  - EKG today shows no acute ischemic changes. - Presentation sounds very atypical but given new murmur one exam will check Echo to assess EF and wall motion. Will also make sure patient has refills of Nitro in case this is needed.   2. CAD s/p CABG  - Patient s/p CABG in 2005 with low risk Myoview in 2016.  - Currently chest pain free. - Continue secondary risk prevention.   3. New Murmur - Soft systolic murmur (possible aortic stenosis) noted on exam today. Patient states he has never been told he has a murmur before and I don't see any documentation of a murmur in the past. - Will check 2D Echo.  4. Hypertension - Patient initially scheduled visit for evaluation of hypertension with recent systolic BP of 0000000 at PCP's office. However, BP well controlled today at 124/64.  - Continue Losartan 50mg  daily.  5. Hyperlipidemia  - Most recent lipid panel from 04/2019: Total Cholesterol 133, Triglycerides 99, HDL 66, LDL 47.  - LDL goal <70 given CAD. - Patient has been unable to tolerate statins or Zetia in the past. Currently on Repatha - will continue.  Current medicines are reviewed with the patient today.  The patient Has no concerns regarding medicines.  The following changes have been made:  See above Labs/ tests ordered today include: See above  Disposition: Patient has 1 year follow-up with Dr. Tamala Julian scheduled for 10/17/2019. Will keep this appointment. May need closer follow-up depending on Echo results.   Signed, Darreld Mclean, PA-C  08/15/2019 2:24 PM    [facility] [place], [place], [place] Phone: [phone]; Fax: [phone]

## 2019-08-15 ENCOUNTER — Other Ambulatory Visit: Payer: Self-pay

## 2019-08-15 ENCOUNTER — Telehealth: Payer: Self-pay

## 2019-08-15 ENCOUNTER — Other Ambulatory Visit: Payer: Self-pay | Admitting: Pharmacist

## 2019-08-15 ENCOUNTER — Encounter: Payer: Self-pay | Admitting: Student

## 2019-08-15 ENCOUNTER — Ambulatory Visit (INDEPENDENT_AMBULATORY_CARE_PROVIDER_SITE_OTHER): Payer: Medicare Other | Admitting: Student

## 2019-08-15 VITALS — BP 124/64 | HR 79 | Ht 68.5 in | Wt 182.1 lb

## 2019-08-15 DIAGNOSIS — I10 Essential (primary) hypertension: Secondary | ICD-10-CM | POA: Diagnosis not present

## 2019-08-15 DIAGNOSIS — R011 Cardiac murmur, unspecified: Secondary | ICD-10-CM

## 2019-08-15 DIAGNOSIS — R079 Chest pain, unspecified: Secondary | ICD-10-CM

## 2019-08-15 DIAGNOSIS — R0789 Other chest pain: Secondary | ICD-10-CM | POA: Diagnosis not present

## 2019-08-15 DIAGNOSIS — I2510 Atherosclerotic heart disease of native coronary artery without angina pectoris: Secondary | ICD-10-CM | POA: Diagnosis not present

## 2019-08-15 DIAGNOSIS — E785 Hyperlipidemia, unspecified: Secondary | ICD-10-CM

## 2019-08-15 MED ORDER — REPATHA SURECLICK 140 MG/ML ~~LOC~~ SOAJ: 1.0000 "pen " | SUBCUTANEOUS | 3 refills | Status: DC

## 2019-08-15 MED ORDER — NITROGLYCERIN 0.4 MG SL SUBL: 0.4000 mg | SUBLINGUAL_TABLET | SUBLINGUAL | 3 refills | Status: DC | PRN

## 2019-08-15 NOTE — Patient Instructions (Signed)
Medication Instructions:   Your physician recommends that you continue on your current medications as directed. Please refer to the Current Medication list given to you today.  If you need a refill on your cardiac medications before your next appointment, please call your pharmacy.   Lab work:  None ordered today  If you have labs (blood work) drawn today and your tests are completely normal, you will receive your results only by: Marland Kitchen MyChart Message (if you have MyChart) OR . A paper copy in the mail If you have any lab test that is abnormal or we need to change your treatment, we will call you to review the results.  Testing/Procedures:  Your physician has requested that you have an echocardiogram on 08/22/19 at 8:30AM. Echocardiography is a painless test that uses sound waves to create images of your heart. It provides your doctor with information about the size and shape of your heart and how well your heart's chambers and valves are working. This procedure takes approximately one hour. There are no restrictions for this procedure.   Follow-Up:  Keep appointment on 10/17/19 at 9:20AM with Daneen Schick, MD

## 2019-08-15 NOTE — Telephone Encounter (Signed)
Started telephone encounter in error.

## 2019-08-16 DIAGNOSIS — H04123 Dry eye syndrome of bilateral lacrimal glands: Secondary | ICD-10-CM | POA: Diagnosis not present

## 2019-08-16 DIAGNOSIS — H35372 Puckering of macula, left eye: Secondary | ICD-10-CM | POA: Diagnosis not present

## 2019-08-16 DIAGNOSIS — D3131 Benign neoplasm of right choroid: Secondary | ICD-10-CM | POA: Diagnosis not present

## 2019-08-16 DIAGNOSIS — H2513 Age-related nuclear cataract, bilateral: Secondary | ICD-10-CM | POA: Diagnosis not present

## 2019-08-16 DIAGNOSIS — H43812 Vitreous degeneration, left eye: Secondary | ICD-10-CM | POA: Diagnosis not present

## 2019-08-22 ENCOUNTER — Ambulatory Visit (HOSPITAL_COMMUNITY): Payer: Medicare Other | Attending: Internal Medicine

## 2019-08-22 ENCOUNTER — Other Ambulatory Visit: Payer: Self-pay

## 2019-08-22 DIAGNOSIS — R079 Chest pain, unspecified: Secondary | ICD-10-CM

## 2019-08-22 DIAGNOSIS — R0789 Other chest pain: Secondary | ICD-10-CM | POA: Insufficient documentation

## 2019-08-22 DIAGNOSIS — R011 Cardiac murmur, unspecified: Secondary | ICD-10-CM | POA: Diagnosis not present

## 2019-08-22 MED ORDER — PERFLUTREN LIPID MICROSPHERE
1.0000 mL | INTRAVENOUS | Status: AC | PRN
  Administered 2019-08-22: 1 mL via INTRAVENOUS

## 2019-08-23 ENCOUNTER — Telehealth: Payer: Self-pay

## 2019-08-23 DIAGNOSIS — M25561 Pain in right knee: Secondary | ICD-10-CM | POA: Diagnosis not present

## 2019-08-23 DIAGNOSIS — M1711 Unilateral primary osteoarthritis, right knee: Secondary | ICD-10-CM | POA: Diagnosis not present

## 2019-08-23 NOTE — Telephone Encounter (Signed)
-----   Message from Darreld Mclean, [place] sent at 08/22/2019 10:42 PM EDT ----- Please notify patient of the results: Aortic valve was noted to be mildly thickened with mild to moderate leaking which is likely the murmur I heard on exam. Pumping function of the heart is normal. Mild thickening and stiffness of the heart muscle which is not uncommon as we get older. We can continue to monitor this in the future with repeat ultrasounds but nothing to do at this time.   Patient should keep follow-up appointment with Dr. Tamala Julian on 10/17/2019. If he could keep a log of his blood pressures and bring that to his office visit that would be helpful (does not need to measure BP daily but maybe a couple times a week).   If he has any recurrent chest pain, he should call our office.  Thanks!

## 2019-08-23 NOTE — Telephone Encounter (Signed)
Notes recorded by Frederik Schmidt, RN on 08/23/2019 at 8:23 AM EDT  The patient has been notified of the result and verbalized understanding. All questions (if any) were answered.  Frederik Schmidt, RN 08/23/2019 8:22 AM

## 2019-08-30 DIAGNOSIS — Z01812 Encounter for preprocedural laboratory examination: Secondary | ICD-10-CM | POA: Diagnosis not present

## 2019-08-30 DIAGNOSIS — Z01818 Encounter for other preprocedural examination: Secondary | ICD-10-CM | POA: Diagnosis not present

## 2019-08-30 DIAGNOSIS — Z20828 Contact with and (suspected) exposure to other viral communicable diseases: Secondary | ICD-10-CM | POA: Diagnosis not present

## 2019-08-30 DIAGNOSIS — H2513 Age-related nuclear cataract, bilateral: Secondary | ICD-10-CM | POA: Diagnosis not present

## 2019-09-05 DIAGNOSIS — H25812 Combined forms of age-related cataract, left eye: Secondary | ICD-10-CM | POA: Diagnosis not present

## 2019-09-05 DIAGNOSIS — H52202 Unspecified astigmatism, left eye: Secondary | ICD-10-CM | POA: Diagnosis not present

## 2019-09-05 DIAGNOSIS — H2513 Age-related nuclear cataract, bilateral: Secondary | ICD-10-CM | POA: Diagnosis not present

## 2019-09-05 DIAGNOSIS — I10 Essential (primary) hypertension: Secondary | ICD-10-CM | POA: Diagnosis not present

## 2019-09-12 ENCOUNTER — Ambulatory Visit: Payer: Medicare Other

## 2019-09-13 DIAGNOSIS — Z20828 Contact with and (suspected) exposure to other viral communicable diseases: Secondary | ICD-10-CM | POA: Diagnosis not present

## 2019-09-13 DIAGNOSIS — H25811 Combined forms of age-related cataract, right eye: Secondary | ICD-10-CM | POA: Diagnosis not present

## 2019-09-13 DIAGNOSIS — Z01812 Encounter for preprocedural laboratory examination: Secondary | ICD-10-CM | POA: Diagnosis not present

## 2019-09-19 DIAGNOSIS — H52201 Unspecified astigmatism, right eye: Secondary | ICD-10-CM | POA: Diagnosis not present

## 2019-09-19 DIAGNOSIS — H25811 Combined forms of age-related cataract, right eye: Secondary | ICD-10-CM | POA: Diagnosis not present

## 2019-10-08 ENCOUNTER — Telehealth: Payer: Self-pay | Admitting: *Deleted

## 2019-10-08 DIAGNOSIS — Z006 Encounter for examination for normal comparison and control in clinical research program: Secondary | ICD-10-CM

## 2019-10-08 NOTE — Telephone Encounter (Signed)
GOULD EDUInformed Consent  Subject Name:Nathan [name]  Subject met inclusion and exclusion criteria. The informed consent form, study requirements and expectations were reviewed with the subject and questions and concerns were addressed prior to the signing of the consent form. The subject verbalized understanding of the trial requirements. The subject agreed to participate in the [place] and gave verbal consent to participate in the [place] 10/04/2019. The informed consent was obtained prior to performance of any protocol-specific procedures for the subject. A copy of the signed informed consent was mailedto the subject and a copy was placed in the subject's medical record.  Oletta Cohn.

## 2019-10-09 DIAGNOSIS — I351 Nonrheumatic aortic (valve) insufficiency: Secondary | ICD-10-CM | POA: Diagnosis not present

## 2019-10-09 DIAGNOSIS — L723 Sebaceous cyst: Secondary | ICD-10-CM | POA: Diagnosis not present

## 2019-10-09 DIAGNOSIS — I359 Nonrheumatic aortic valve disorder, unspecified: Secondary | ICD-10-CM | POA: Insufficient documentation

## 2019-10-16 NOTE — Progress Notes (Signed)
Cardiology Office Note:    Date:  10/17/2019   ID:  Nathan [name], DOB 07/17/1946, MRN [id number]  PCP:  Marton Redwood, MD  Cardiologist:  Sinclair Grooms, MD   Referring MD: Marton Redwood, MD   Chief Complaint  Patient presents with  . Coronary Artery Disease    History of Present Illness:    Nathan [name] is a 73 y.o. male with a hx of prior CABG 2003, hypertension, hyperlipidemia, and family history of CAD. Now having increasing difficulty with medication intolerances including beta blockers, statins, and he feels Lexiscan also cause chest pain.  Nathan [name] is doing well.  He was having some chest burning earlier this year that improved on proton pump inhibitor therapy.  He denies exertional discomfort.  He still walks 3-1/2 miles per day but is increasingly limited by knee discomfort.  He has no exertion related symptoms.  He denies orthopnea and PND.  During evaluations earlier this year, primary care heard a systolic murmur and referred him back to cardiology.  Echocardiogram was done and demonstrates  Past Medical History:  Diagnosis Date  . Arthritis   . Blood transfusion without reported diagnosis   . Complication of anesthesia    jittery after surgery  . Coronary artery disease   . GERD (gastroesophageal reflux disease)   . Hyperlipemia   . Hypertension   . Right carpal tunnel syndrome 02/08/2017  . Seasonal allergies   . Wears glasses     Past Surgical History:  Procedure Laterality Date  . APPENDECTOMY     age 72  . COLONOSCOPY    . COLONOSCOPY WITH PROPOFOL N/A 02/15/2016   Procedure: COLONOSCOPY WITH PROPOFOL;  Surgeon: Garlan Fair, MD;  Location: [facility];  Service: Endoscopy;  Laterality: N/A;  . CORONARY ARTERY BYPASS GRAFT     2003  . ESOPHAGOGASTRODUODENOSCOPY (EGD) WITH PROPOFOL N/A 02/15/2016   Procedure: ESOPHAGOGASTRODUODENOSCOPY (EGD) WITH PROPOFOL;  Surgeon: Garlan Fair, MD;  [facility];  Service: Endoscopy;  Laterality:  N/A;  . EXPLORATION POST OPERATIVE OPEN HEART    . EYE SURGERY     detached retina-lt  . INGUINAL HERNIA REPAIR Left 04/01/2014   Procedure: HERNIA REPAIR INGUINAL ADULT;  Surgeon: Shann Medal, MD;  Location: [facility];  Service: General;  Laterality: Left;  . INSERTION OF MESH Left 04/01/2014   Procedure: INSERTION OF MESH;  Surgeon: Shann Medal, MD;  Location: [facility];  Service: General;  Laterality: Left;  . KNEE ARTHROCENTESIS  1980   right  . SHOULDER ACROMIOPLASTY  1985   rt  . SINUS ENDO W/FUSION    . TONSILLECTOMY AND ADENOIDECTOMY      Current Medications: Current Meds  Medication Sig  . aspirin EC 81 MG tablet Take 81 mg by mouth daily.  . cetirizine (ZYRTEC) 10 MG tablet Take 10 mg by mouth daily.  . cycloSPORINE (RESTASIS) 0.05 % ophthalmic emulsion Place 1 drop into both eyes 2 (two) times daily.   Marland Kitchen doxycycline (VIBRA-TABS) 100 MG tablet Take 100 mg by mouth 2 (two) times daily.  . Evolocumab (REPATHA SURECLICK) XX123456 MG/ML SOAJ Inject 1 pen into the skin every 14 (fourteen) days.  . flurazepam (DALMANE) 30 MG capsule Take 30 mg by mouth at bedtime as needed for sleep.  Marland Kitchen ketoconazole (NIZORAL) 2 % cream Use as directed  . losartan (COZAAR) 25 MG tablet Take 2 tablets (50 mg total) by mouth daily.  . nitroGLYCERIN (NITROSTAT)  0.4 MG SL tablet Place 1 tablet (0.4 mg total) under the tongue every 5 (five) minutes as needed for chest pain.  Marland Kitchen triamcinolone (NASACORT) 55 MCG/ACT AERO nasal inhaler Place 2 sprays into the nose daily.     Allergies:   Lipitor [atorvastatin], Bystolic [nebivolol hcl], Lexiscan [regadenoson], Amlodipine, Lisinopril, Zetia [ezetimibe], Neosporin [neomycin-bacitracin zn-polymyx], Pravastatin, and Toprol xl [metoprolol tartrate]   Social History   Socioeconomic History  . Marital status: Widowed    Spouse name: Not on file  . Number of children: Not on file  . Years of education: post grad  . Highest  education level: Not on file  Occupational History  . Occupation: [employer]  Social Needs  . Financial resource strain: Not on file  . Food insecurity    Worry: Not on file    Inability: Not on file  . Transportation needs    Medical: Not on file    Non-medical: Not on file  Tobacco Use  . Smoking status: Former Smoker    Quit date: 03/26/1969    Years since quitting: 50.5  . Smokeless tobacco: Never Used  Substance and Sexual Activity  . Alcohol use: Yes    Alcohol/week: 0.0 standard drinks    Comment: 2 per day  . Drug use: No  . Sexual activity: Not on file  Lifestyle  . Physical activity    Days per week: Not on file    Minutes per session: Not on file  . Stress: Not on file  Relationships  . Social Herbalist on phone: Not on file    Gets together: Not on file    Attends religious service: Not on file    Active member of club or organization: Not on file    Attends meetings of clubs or organizations: Not on file    Relationship status: Not on file  Other Topics Concern  . Not on file  Social History Narrative   Patient drinks 1-2 cups of caffeine daily.   Patient is left handed.      Family History: The patient's family history includes Alcohol abuse in his brother; Cancer in his father; Heart disease in his mother; Kidney failure in his brother.  ROS:   Please see the history of present illness.    Bilateral knee arthritis is starting to impair ability to exercises vigorously and as long as prior.  Dr. Felix Ahmadi is his orthopedic surgeon who follows the problem.  All other systems reviewed and are negative.  EKGs/Labs/Other Studies Reviewed:    The following studies were reviewed today:  2D Doppler echocardiogram September 2020: IMPRESSIONS 1. The left ventricle has hyperdynamic systolic function, with an ejection fraction of >65%. The cavity size was normal. There is mildly increased left ventricular wall thickness. Left ventricular diastolic Doppler  parameters are consistent with  impaired relaxation.  2. The right ventricle has normal systolic function. The cavity was normal. There is no increase in right ventricular wall thickness.  3. The aortic valve is tricuspid. Mild thickening of the aortic valve. Mild calcification of the aortic valve. Aortic valve regurgitation is mild to moderate by color flow Doppler.  4. The aorta is normal unless otherwise noted  EKG:  EKG normal sinus rhythm, borderline significant inferior Q waves, prominent voltage, but otherwise unremarkable on August 22, 2019.  Recent Labs: No results found for requested labs within last 8760 hours.  Recent Lipid Panel    Component Value Date/Time   CHOL 144 09/06/2017 0801  TRIG 111 09/06/2017 0801   HDL 70 09/06/2017 0801   CHOLHDL 2.1 09/06/2017 0801   CHOLHDL 1.8 08/08/2016 0810   VLDL 16 08/08/2016 0810   LDLCALC 52 09/06/2017 0801    Physical Exam:    VS:  BP (!) 142/68   Pulse 76   Ht 5' 8.5" (1.74 m)   Wt 182 lb 1.9 oz (82.6 kg)   SpO2 98%   BMI 27.29 kg/m     Wt Readings from Last 3 Encounters:  10/17/19 182 lb 1.9 oz (82.6 kg)  08/15/19 182 lb 1.9 oz (82.6 kg)  10/02/18 181 lb (82.1 kg)     GEN: Healthy-appearing. No acute distress HEENT: Normal NECK: No JVD. LYMPHATICS: No lymphadenopathy CARDIAC: No diastolic aortic regurgitation murmur is audible.  There is a 1-2/ 6 right upper sternal border systolic and left mid sternal border systolic murmur RRR, gallop, or edema. VASCULAR:  Normal Pulses. No bruits. RESPIRATORY:  Clear to auscultation without rales, wheezing or rhonchi  ABDOMEN: Soft, non-tender, non-distended, No pulsatile mass, MUSCULOSKELETAL: No deformity  SKIN: Warm and dry NEUROLOGIC:  Alert and oriented x 3 PSYCHIATRIC:  Normal affect   ASSESSMENT:    1. Coronary artery disease involving coronary bypass graft of native heart without angina pectoris   2. Aortic valve disease   3. Hyperlipidemia, unspecified  hyperlipidemia type   4. Essential hypertension   5. Educated about COVID-19 virus infection    PLAN:    In order of problems listed above:  1. Secondary prevention discussed. 2. 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  The echo interpretation of aortic regurgitation seems clinically overestimated.  We are unable to hear a diastolic murmur.  There is a soft systolic murmur.  The valve has calcification and thickening.  There is no clinically actionable disease at this time.  The aortic valve will be temporally followed over time.  Probable neck study in 2 to 3 years. 3. Target LDL less than 70 4. Target blood pressure 130/80 mmHg or less. 5. The 3 diabetes are discussed and endorses lifestyle measures.  Overall education and awareness concerning secondary risk prevention was discussed in detail: LDL less than 70, hemoglobin A1c less than 7, blood pressure target less than 130/80 mmHg, >150 minutes of moderate aerobic activity per week, avoidance of smoking, weight control (via diet and exercise), and continued surveillance/management of/for obstructive sleep apnea.    Medication Adjustments/Labs and Tests Ordered: Current medicines are reviewed at length with the patient today.  Concerns regarding medicines are outlined above.  No orders of the defined types were placed in this encounter.  No orders of the defined types were placed in this encounter.   Patient Instructions  Medication Instructions:  Your physician recommends that you continue on your current medications as directed. Please refer to the Current Medication list given to you today.  *If you need a refill on your cardiac medications before your next appointment, please call your pharmacy*  Lab Work: None If you have labs (blood work) drawn today and your tests are completely normal, you will receive your results only by: Marland Kitchen MyChart Message (if you have MyChart) OR . A paper copy in the mail If you have any lab test that  is abnormal or we need to change your treatment, we will call you to review the results.  Testing/Procedures: None  Follow-Up: At [facility], you and your health needs are our priority.  As part of our continuing mission to provide you with exceptional  heart care, we have created designated Provider Care Teams.  These Care Teams include your primary Cardiologist (physician) and Advanced Practice Providers (APPs -  Physician Assistants and Nurse Practitioners) who all work together to provide you with the care you need, when you need it.  Your next appointment:   12 months  The format for your next appointment:   In Person  Provider:   You may see Sinclair Grooms, MD or one of the following Advanced Practice Providers on your designated Care Team:    Truitt Merle, NP  Cecilie Kicks, NP  Kathyrn Drown, NP   Other Instructions      Signed, Sinclair Grooms, MD  10/17/2019 12:09 PM    [place]

## 2019-10-17 ENCOUNTER — Ambulatory Visit (INDEPENDENT_AMBULATORY_CARE_PROVIDER_SITE_OTHER): Payer: Medicare Other | Admitting: Interventional Cardiology

## 2019-10-17 ENCOUNTER — Other Ambulatory Visit: Payer: Self-pay

## 2019-10-17 ENCOUNTER — Encounter: Payer: Self-pay | Admitting: Interventional Cardiology

## 2019-10-17 VITALS — BP 142/68 | HR 76 | Ht 68.5 in | Wt 182.1 lb

## 2019-10-17 DIAGNOSIS — I359 Nonrheumatic aortic valve disorder, unspecified: Secondary | ICD-10-CM

## 2019-10-17 DIAGNOSIS — Z7189 Other specified counseling: Secondary | ICD-10-CM

## 2019-10-17 DIAGNOSIS — E785 Hyperlipidemia, unspecified: Secondary | ICD-10-CM

## 2019-10-17 DIAGNOSIS — I10 Essential (primary) hypertension: Secondary | ICD-10-CM | POA: Diagnosis not present

## 2019-10-17 DIAGNOSIS — I25810 Atherosclerosis of coronary artery bypass graft(s) without angina pectoris: Secondary | ICD-10-CM | POA: Diagnosis not present

## 2019-10-17 NOTE — Patient Instructions (Signed)

## 2019-11-01 DIAGNOSIS — D2271 Melanocytic nevi of right lower limb, including hip: Secondary | ICD-10-CM | POA: Diagnosis not present

## 2019-11-01 DIAGNOSIS — M7130 Other bursal cyst, unspecified site: Secondary | ICD-10-CM | POA: Diagnosis not present

## 2019-11-01 DIAGNOSIS — L218 Other seborrheic dermatitis: Secondary | ICD-10-CM | POA: Diagnosis not present

## 2019-11-01 DIAGNOSIS — L821 Other seborrheic keratosis: Secondary | ICD-10-CM | POA: Diagnosis not present

## 2019-11-01 DIAGNOSIS — Z85828 Personal history of other malignant neoplasm of skin: Secondary | ICD-10-CM | POA: Diagnosis not present

## 2019-11-01 DIAGNOSIS — D485 Neoplasm of uncertain behavior of skin: Secondary | ICD-10-CM | POA: Diagnosis not present

## 2019-11-01 DIAGNOSIS — D225 Melanocytic nevi of trunk: Secondary | ICD-10-CM | POA: Diagnosis not present

## 2019-11-01 DIAGNOSIS — D1801 Hemangioma of skin and subcutaneous tissue: Secondary | ICD-10-CM | POA: Diagnosis not present

## 2019-11-01 DIAGNOSIS — D2261 Melanocytic nevi of right upper limb, including shoulder: Secondary | ICD-10-CM | POA: Diagnosis not present

## 2019-11-01 DIAGNOSIS — L905 Scar conditions and fibrosis of skin: Secondary | ICD-10-CM | POA: Diagnosis not present

## 2019-12-16 DIAGNOSIS — D485 Neoplasm of uncertain behavior of skin: Secondary | ICD-10-CM | POA: Diagnosis not present

## 2019-12-16 DIAGNOSIS — Z85828 Personal history of other malignant neoplasm of skin: Secondary | ICD-10-CM | POA: Diagnosis not present

## 2019-12-27 ENCOUNTER — Ambulatory Visit: Payer: Medicare Other | Attending: Internal Medicine

## 2019-12-27 DIAGNOSIS — Z23 Encounter for immunization: Secondary | ICD-10-CM | POA: Insufficient documentation

## 2019-12-27 NOTE — Progress Notes (Signed)
   Covid-19 Vaccination Clinic  Name:  HARMONY ROSSNER    MRN: [id number] DOB: 09-09-1946  12/27/2019  Mr. Friesen was observed post Covid-19 immunization for 15 minutes without incidence. He was provided with Vaccine Information Sheet and instruction to access the V-Safe system.   Mr. Fessler was instructed to call 911 with any severe reactions post vaccine: Marland Kitchen Difficulty breathing  . Swelling of your face and throat  . A fast heartbeat  . A bad rash all over your body  . Dizziness and weakness    Immunizations Administered    Name Date Dose VIS Date Route   Pfizer COVID-19 Vaccine 12/27/2019  8:24 AM 0.3 mL 11/22/2019 Intramuscular   Manufacturer: [place]   Lot: [id number]   [place]: [id number]

## 2020-01-03 DIAGNOSIS — H26492 Other secondary cataract, left eye: Secondary | ICD-10-CM | POA: Diagnosis not present

## 2020-01-16 ENCOUNTER — Ambulatory Visit: Payer: Medicare Other | Attending: Internal Medicine

## 2020-01-16 DIAGNOSIS — Z23 Encounter for immunization: Secondary | ICD-10-CM | POA: Insufficient documentation

## 2020-01-16 NOTE — Progress Notes (Signed)
   Covid-19 Vaccination Clinic  Name:  Nathan [name]    MRN: [id number] DOB: 1946/04/09  01/16/2020  Nathan [name] was observed post Covid-19 immunization for 15 minutes without incidence. He was provided with Vaccine Information Sheet and instruction to access the V-Safe system.   Nathan [name] was instructed to call 911 with any severe reactions post vaccine: Marland Kitchen Difficulty breathing  . Swelling of your face and throat  . A fast heartbeat  . A bad rash all over your body  . Dizziness and weakness    Immunizations Administered    Name Date Dose VIS Date Route   Pfizer COVID-19 Vaccine 01/16/2020 10:36 AM 0.3 mL 11/22/2019 Intramuscular   Manufacturer: Eva   Lot: [id number]   [place]: [id number]

## 2020-01-27 DIAGNOSIS — H04123 Dry eye syndrome of bilateral lacrimal glands: Secondary | ICD-10-CM | POA: Diagnosis not present

## 2020-01-27 DIAGNOSIS — Z8669 Personal history of other diseases of the nervous system and sense organs: Secondary | ICD-10-CM | POA: Diagnosis not present

## 2020-01-27 DIAGNOSIS — D23122 Other benign neoplasm of skin of left lower eyelid, including canthus: Secondary | ICD-10-CM | POA: Diagnosis not present

## 2020-01-27 DIAGNOSIS — Z961 Presence of intraocular lens: Secondary | ICD-10-CM | POA: Diagnosis not present

## 2020-01-27 DIAGNOSIS — H35372 Puckering of macula, left eye: Secondary | ICD-10-CM | POA: Diagnosis not present

## 2020-01-27 DIAGNOSIS — Z9841 Cataract extraction status, right eye: Secondary | ICD-10-CM | POA: Diagnosis not present

## 2020-01-27 DIAGNOSIS — Z9842 Cataract extraction status, left eye: Secondary | ICD-10-CM | POA: Diagnosis not present

## 2020-01-27 DIAGNOSIS — L820 Inflamed seborrheic keratosis: Secondary | ICD-10-CM | POA: Diagnosis not present

## 2020-02-27 DIAGNOSIS — M1612 Unilateral primary osteoarthritis, left hip: Secondary | ICD-10-CM | POA: Diagnosis not present

## 2020-02-27 DIAGNOSIS — M25552 Pain in left hip: Secondary | ICD-10-CM | POA: Diagnosis not present

## 2020-02-27 DIAGNOSIS — M1711 Unilateral primary osteoarthritis, right knee: Secondary | ICD-10-CM | POA: Diagnosis not present

## 2020-02-27 DIAGNOSIS — M25561 Pain in right knee: Secondary | ICD-10-CM | POA: Diagnosis not present

## 2020-02-27 DIAGNOSIS — M25562 Pain in left knee: Secondary | ICD-10-CM | POA: Diagnosis not present

## 2020-02-27 DIAGNOSIS — M1712 Unilateral primary osteoarthritis, left knee: Secondary | ICD-10-CM | POA: Diagnosis not present

## 2020-03-06 DIAGNOSIS — Z9889 Other specified postprocedural states: Secondary | ICD-10-CM | POA: Diagnosis not present

## 2020-03-06 DIAGNOSIS — D23122 Other benign neoplasm of skin of left lower eyelid, including canthus: Secondary | ICD-10-CM | POA: Diagnosis not present

## 2020-03-06 DIAGNOSIS — Z9842 Cataract extraction status, left eye: Secondary | ICD-10-CM | POA: Diagnosis not present

## 2020-03-06 DIAGNOSIS — H35372 Puckering of macula, left eye: Secondary | ICD-10-CM | POA: Diagnosis not present

## 2020-03-06 DIAGNOSIS — H26491 Other secondary cataract, right eye: Secondary | ICD-10-CM | POA: Diagnosis not present

## 2020-03-06 DIAGNOSIS — Z961 Presence of intraocular lens: Secondary | ICD-10-CM | POA: Diagnosis not present

## 2020-03-06 DIAGNOSIS — Z9841 Cataract extraction status, right eye: Secondary | ICD-10-CM | POA: Diagnosis not present

## 2020-03-06 DIAGNOSIS — H04123 Dry eye syndrome of bilateral lacrimal glands: Secondary | ICD-10-CM | POA: Diagnosis not present

## 2020-03-10 DIAGNOSIS — M25552 Pain in left hip: Secondary | ICD-10-CM | POA: Diagnosis not present

## 2020-03-18 ENCOUNTER — Other Ambulatory Visit: Payer: Self-pay | Admitting: Interventional Cardiology

## 2020-03-19 DIAGNOSIS — M25552 Pain in left hip: Secondary | ICD-10-CM | POA: Diagnosis not present

## 2020-03-19 DIAGNOSIS — M7062 Trochanteric bursitis, left hip: Secondary | ICD-10-CM | POA: Diagnosis not present

## 2020-03-27 DIAGNOSIS — M7062 Trochanteric bursitis, left hip: Secondary | ICD-10-CM | POA: Insufficient documentation

## 2020-04-07 DIAGNOSIS — M25552 Pain in left hip: Secondary | ICD-10-CM | POA: Diagnosis not present

## 2020-04-14 DIAGNOSIS — M25552 Pain in left hip: Secondary | ICD-10-CM | POA: Diagnosis not present

## 2020-04-16 DIAGNOSIS — M25552 Pain in left hip: Secondary | ICD-10-CM | POA: Diagnosis not present

## 2020-04-21 DIAGNOSIS — M25552 Pain in left hip: Secondary | ICD-10-CM | POA: Diagnosis not present

## 2020-04-23 DIAGNOSIS — M25552 Pain in left hip: Secondary | ICD-10-CM | POA: Diagnosis not present

## 2020-04-29 DIAGNOSIS — M25552 Pain in left hip: Secondary | ICD-10-CM | POA: Diagnosis not present

## 2020-04-30 DIAGNOSIS — D1801 Hemangioma of skin and subcutaneous tissue: Secondary | ICD-10-CM | POA: Diagnosis not present

## 2020-04-30 DIAGNOSIS — L821 Other seborrheic keratosis: Secondary | ICD-10-CM | POA: Diagnosis not present

## 2020-04-30 DIAGNOSIS — L570 Actinic keratosis: Secondary | ICD-10-CM | POA: Diagnosis not present

## 2020-04-30 DIAGNOSIS — D225 Melanocytic nevi of trunk: Secondary | ICD-10-CM | POA: Diagnosis not present

## 2020-04-30 DIAGNOSIS — L820 Inflamed seborrheic keratosis: Secondary | ICD-10-CM | POA: Diagnosis not present

## 2020-04-30 DIAGNOSIS — D2261 Melanocytic nevi of right upper limb, including shoulder: Secondary | ICD-10-CM | POA: Diagnosis not present

## 2020-04-30 DIAGNOSIS — L814 Other melanin hyperpigmentation: Secondary | ICD-10-CM | POA: Diagnosis not present

## 2020-04-30 DIAGNOSIS — Z85828 Personal history of other malignant neoplasm of skin: Secondary | ICD-10-CM | POA: Diagnosis not present

## 2020-05-01 DIAGNOSIS — M25552 Pain in left hip: Secondary | ICD-10-CM | POA: Diagnosis not present

## 2020-05-05 DIAGNOSIS — M25552 Pain in left hip: Secondary | ICD-10-CM | POA: Diagnosis not present

## 2020-05-07 DIAGNOSIS — M25552 Pain in left hip: Secondary | ICD-10-CM | POA: Diagnosis not present

## 2020-05-08 DIAGNOSIS — M25561 Pain in right knee: Secondary | ICD-10-CM | POA: Diagnosis not present

## 2020-05-08 DIAGNOSIS — M25552 Pain in left hip: Secondary | ICD-10-CM | POA: Diagnosis not present

## 2020-05-08 DIAGNOSIS — M25562 Pain in left knee: Secondary | ICD-10-CM | POA: Diagnosis not present

## 2020-05-18 DIAGNOSIS — M25552 Pain in left hip: Secondary | ICD-10-CM | POA: Diagnosis not present

## 2020-05-21 DIAGNOSIS — M25552 Pain in left hip: Secondary | ICD-10-CM | POA: Diagnosis not present

## 2020-05-26 DIAGNOSIS — M25552 Pain in left hip: Secondary | ICD-10-CM | POA: Diagnosis not present

## 2020-06-08 DIAGNOSIS — R002 Palpitations: Secondary | ICD-10-CM | POA: Insufficient documentation

## 2020-06-12 DIAGNOSIS — Z1212 Encounter for screening for malignant neoplasm of rectum: Secondary | ICD-10-CM | POA: Diagnosis not present

## 2020-06-22 DIAGNOSIS — M25552 Pain in left hip: Secondary | ICD-10-CM | POA: Diagnosis not present

## 2020-06-30 DIAGNOSIS — M7062 Trochanteric bursitis, left hip: Secondary | ICD-10-CM | POA: Diagnosis not present

## 2020-06-30 DIAGNOSIS — M25552 Pain in left hip: Secondary | ICD-10-CM | POA: Diagnosis not present

## 2020-06-30 DIAGNOSIS — M25561 Pain in right knee: Secondary | ICD-10-CM | POA: Diagnosis not present

## 2020-06-30 DIAGNOSIS — M25551 Pain in right hip: Secondary | ICD-10-CM | POA: Diagnosis not present

## 2020-07-07 DIAGNOSIS — M25552 Pain in left hip: Secondary | ICD-10-CM | POA: Diagnosis not present

## 2020-07-15 DIAGNOSIS — M25552 Pain in left hip: Secondary | ICD-10-CM | POA: Diagnosis not present

## 2020-07-27 ENCOUNTER — Other Ambulatory Visit: Payer: Self-pay | Admitting: Interventional Cardiology

## 2020-08-04 DIAGNOSIS — M25552 Pain in left hip: Secondary | ICD-10-CM | POA: Diagnosis not present

## 2020-08-25 DIAGNOSIS — Z23 Encounter for immunization: Secondary | ICD-10-CM | POA: Diagnosis not present

## 2020-08-31 DIAGNOSIS — M25832 Other specified joint disorders, left wrist: Secondary | ICD-10-CM | POA: Diagnosis not present

## 2020-08-31 DIAGNOSIS — M25531 Pain in right wrist: Secondary | ICD-10-CM | POA: Diagnosis not present

## 2020-08-31 DIAGNOSIS — M25519 Pain in unspecified shoulder: Secondary | ICD-10-CM | POA: Diagnosis not present

## 2020-08-31 DIAGNOSIS — G5601 Carpal tunnel syndrome, right upper limb: Secondary | ICD-10-CM | POA: Diagnosis not present

## 2020-08-31 DIAGNOSIS — M25532 Pain in left wrist: Secondary | ICD-10-CM | POA: Diagnosis not present

## 2020-08-31 DIAGNOSIS — M24231 Disorder of ligament, right wrist: Secondary | ICD-10-CM | POA: Diagnosis not present

## 2020-08-31 DIAGNOSIS — M25511 Pain in right shoulder: Secondary | ICD-10-CM | POA: Diagnosis not present

## 2020-08-31 DIAGNOSIS — M65332 Trigger finger, left middle finger: Secondary | ICD-10-CM | POA: Diagnosis not present

## 2020-09-04 DIAGNOSIS — Z23 Encounter for immunization: Secondary | ICD-10-CM | POA: Diagnosis not present

## 2020-10-02 DIAGNOSIS — M25552 Pain in left hip: Secondary | ICD-10-CM | POA: Diagnosis not present

## 2020-10-02 DIAGNOSIS — M7062 Trochanteric bursitis, left hip: Secondary | ICD-10-CM | POA: Diagnosis not present

## 2020-10-02 DIAGNOSIS — M545 Low back pain, unspecified: Secondary | ICD-10-CM | POA: Diagnosis not present

## 2020-10-02 DIAGNOSIS — M1712 Unilateral primary osteoarthritis, left knee: Secondary | ICD-10-CM | POA: Diagnosis not present

## 2020-10-05 DIAGNOSIS — H04123 Dry eye syndrome of bilateral lacrimal glands: Secondary | ICD-10-CM | POA: Diagnosis not present

## 2020-10-05 DIAGNOSIS — Z9841 Cataract extraction status, right eye: Secondary | ICD-10-CM | POA: Diagnosis not present

## 2020-10-05 DIAGNOSIS — Z9842 Cataract extraction status, left eye: Secondary | ICD-10-CM | POA: Diagnosis not present

## 2020-10-05 DIAGNOSIS — H26491 Other secondary cataract, right eye: Secondary | ICD-10-CM | POA: Diagnosis not present

## 2020-10-05 DIAGNOSIS — D2310 Other benign neoplasm of skin of unspecified eyelid, including canthus: Secondary | ICD-10-CM | POA: Diagnosis not present

## 2020-10-05 DIAGNOSIS — H35372 Puckering of macula, left eye: Secondary | ICD-10-CM | POA: Diagnosis not present

## 2020-10-15 NOTE — Progress Notes (Signed)
Cardiology Office Note:    Date:  10/16/2020   ID:  TREYVIN GLIDDEN, DOB 12-18-1945, MRN [id number]  PCP:  Marton Redwood, MD  Cardiologist:  Sinclair Grooms, MD   Referring MD: Marton Redwood, MD   Chief Complaint  Patient presents with  . Coronary Artery Disease  . Cardiac Valve Problem    History of Present Illness:    Nathan [name] is a 74 y.o. 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 Now having increasing difficulty with medication intolerances including beta blockers, statins, and he feels Lexiscan also cause chest pain.  Palpitations that are relatively frequent, awaken him from sleep, and can last up to 2 hours.  These never occurred during the day.  He does not know if he snores.  Feels he sleeps comfortably.  These palpitations began in June.  He does not feel his heart is racing.  Last episode that awaken him was 2 weeks ago.  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  He does not have symptoms that suggest a significant problem.  Past Medical History:  Diagnosis Date  . Arthritis   . Blood transfusion without reported diagnosis   . Complication of anesthesia    jittery after surgery  . Coronary artery disease   . GERD (gastroesophageal reflux disease)   . Hyperlipemia   . Hypertension   . Right carpal tunnel syndrome 02/08/2017  . Seasonal allergies   . Wears glasses     Past Surgical History:  Procedure Laterality Date  . APPENDECTOMY     age 52  . COLONOSCOPY    . COLONOSCOPY WITH PROPOFOL N/A 02/15/2016   Procedure: COLONOSCOPY WITH PROPOFOL;  Surgeon: Garlan Fair, MD;  Location: [facility];  Service: Endoscopy;  Laterality: N/A;  . CORONARY ARTERY BYPASS GRAFT     2003  . ESOPHAGOGASTRODUODENOSCOPY (EGD) WITH PROPOFOL N/A 02/15/2016   Procedure: ESOPHAGOGASTRODUODENOSCOPY (EGD) WITH PROPOFOL;  Surgeon: Garlan Fair, MD;  Location: [facility];  Service: Endoscopy;  Laterality: N/A;  .  EXPLORATION POST OPERATIVE OPEN HEART    . EYE SURGERY     detached retina-lt  . INGUINAL HERNIA REPAIR Left 04/01/2014   Procedure: HERNIA REPAIR INGUINAL ADULT;  Surgeon: Shann Medal, MD;  Location: [place];  Service: General;  Laterality: Left;  . INSERTION OF MESH Left 04/01/2014   Procedure: INSERTION OF MESH;  Surgeon: Shann Medal, MD;  Location: [place];  Service: General;  Laterality: Left;  . KNEE ARTHROCENTESIS  1980   right  . SHOULDER ACROMIOPLASTY  1985   rt  . SINUS ENDO W/FUSION    . TONSILLECTOMY AND ADENOIDECTOMY      Current Medications: Current Meds  Medication Sig  . aspirin EC 81 MG tablet Take 81 mg by mouth daily.  . cetirizine (ZYRTEC) 10 MG tablet Take 10 mg by mouth daily.  . cycloSPORINE (RESTASIS) 0.05 % ophthalmic emulsion Place 1 drop into both eyes 2 (two) times daily.   . flurazepam (DALMANE) 30 MG capsule Take 30 mg by mouth at bedtime as needed for sleep.  Marland Kitchen REPATHA SURECLICK 073 MG/ML SOAJ INJECT 1 PEN UNDER THE SKIN (SUBCUTANEOUS INJECTION) EVERY 14 DAYS.  Marland Kitchen triamcinolone (NASACORT) 55 MCG/ACT AERO nasal inhaler Place 2 sprays into the nose daily.  . [DISCONTINUED] losartan (COZAAR) 25 MG tablet TAKE 2 TABLETS BY MOUTH EVERY DAY     Allergies:   Lipitor [atorvastatin], Bystolic [employer]  hcl], Lexiscan [regadenoson], Amlodipine, Lisinopril, Zetia [ezetimibe], Neosporin [neomycin-bacitracin zn-polymyx], Pravastatin, and Toprol xl [metoprolol tartrate]   Social History   Socioeconomic History  . Marital status: Widowed    Spouse name: Not on file  . Number of children: Not on file  . Years of education: post grad  . Highest education level: Not on file  Occupational History  . Occupation: [employer]  Tobacco Use  . Smoking status: Former Smoker    Quit date: 03/26/1969    Years since quitting: 51.5  . Smokeless tobacco: Never Used  Substance and Sexual Activity  . Alcohol use: Yes    Alcohol/week: 0.0  standard drinks    Comment: 2 per day  . Drug use: No  . Sexual activity: Not on file  Other Topics Concern  . Not on file  Social History Narrative   Patient drinks 1-2 cups of caffeine daily.   Patient is left handed.    Social Determinants of Health   Financial Resource Strain:   . Difficulty of Paying Living Expenses: Not on file  Food Insecurity:   . Worried About Charity fundraiser in the Last Year: Not on file  . Ran Out of Food in the Last Year: Not on file  Transportation Needs:   . Lack of Transportation (Medical): Not on file  . Lack of Transportation (Non-Medical): Not on file  Physical Activity:   . Days of Exercise per Week: Not on file  . Minutes of Exercise per Session: Not on file  Stress:   . Feeling of Stress : Not on file  Social Connections:   . Frequency of Communication with Friends and Family: Not on file  . Frequency of Social Gatherings with Friends and Family: Not on file  . Attends Religious Services: Not on file  . Active Member of Clubs or Organizations: Not on file  . Attends Archivist Meetings: Not on file  . Marital Status: Not on file     Family History: The patient's family history includes Alcohol abuse in his brother; Cancer in his father; Heart disease in his mother; Kidney failure in his brother.  ROS:   Please see the history of present illness.    He has aches and pains in multiple joints.  He is seeing Dr. Amedeo Plenty and Dr. Wynelle Link.  He gets cortisone injections.  He does not appear deformed and has normal gait.  All other systems reviewed and are negative.  EKGs/Labs/Other Studies Reviewed:    The following studies were reviewed today: 2D Doppler echocardiogram 2020: IMPRESSIONS    1. The left ventricle has hyperdynamic systolic function, with an  ejection fraction of >65%. The cavity size was normal. There is mildly  increased left ventricular wall thickness. Left ventricular diastolic  Doppler parameters are  consistent with  impaired relaxation.  2. The right ventricle has normal systolic function. The cavity was  normal. There is no increase in right ventricular wall thickness.  3. The aortic valve is tricuspid. Mild thickening of the aortic valve.  Mild calcification of the aortic valve. Aortic valve regurgitation is mild  to moderate by color flow Doppler.  4. The aorta is normal unless otherwise noted   EKG:  EKG normal sinus rhythm with prominent voltage.  Left atrial abnormality.  Recent Labs: No results found for requested labs within last 8760 hours.  Recent Lipid Panel    Component Value Date/Time   CHOL 144 09/06/2017 0801   TRIG 111 09/06/2017 0801  HDL 70 09/06/2017 0801   CHOLHDL 2.1 09/06/2017 0801   CHOLHDL 1.8 08/08/2016 0810   VLDL 16 08/08/2016 0810   LDLCALC 52 09/06/2017 0801    Physical Exam:    VS:  BP (!) 174/78   Pulse 76   Ht 5' 8.5" (1.74 m)   Wt 179 lb 9.6 oz (81.5 kg)   SpO2 99%   BMI 26.91 kg/m     Wt Readings from Last 3 Encounters:  10/16/20 179 lb 9.6 oz (81.5 kg)  10/17/19 182 lb 1.9 oz (82.6 kg)  08/15/19 182 lb 1.9 oz (82.6 kg)     GEN: Healthy-appearing. No acute distress HEENT: Normal NECK: No JVD. LYMPHATICS: No lymphadenopathy CARDIAC: 2-3 / 6 systolic murmur RRR without diastolic murmur, gallop, or edema. VASCULAR:  Normal Pulses. No bruits. RESPIRATORY:  Clear to auscultation without rales, wheezing or rhonchi  ABDOMEN: Soft, non-tender, non-distended, No pulsatile mass, MUSCULOSKELETAL: No deformity  SKIN: Warm and dry NEUROLOGIC:  Alert and oriented x 3 PSYCHIATRIC:  Normal affect   ASSESSMENT:    1. Coronary artery disease involving coronary bypass graft of native heart without angina pectoris   2. Aortic valve disease   3. Hyperlipidemia, unspecified hyperlipidemia type   4. Essential hypertension   5. Educated about COVID-19 virus infection   6. Palpitations    PLAN:    In order of problems listed  above:  1. Secondary prevention reviewed as outlined below 2. Calcific aortic valve disease.  2D Doppler echocardiogram prior to next years visit 3. Continue Repatha..  Recent lipid panel demonstrated LDL of 51 in June. 4. Excellent blood pressure control out of the office with multiple recordings at cardiac rehab that are less than the 130/80 mmHg target..  Continue losartan 50 mg/day.  Consolidate 25 mg tablets to daily to 50 mg tablets 1/day. 5. He has been vaccinated and boosted with mRNA Pfizer 6. 30-day monitor will be done to rule out atrial fibrillation.  Overall education and awareness concerning primary/secondary risk prevention was discussed in detail: LDL less than 70, hemoglobin A1c less than 7, blood pressure target less than 130/80 mmHg, >150 minutes of moderate aerobic activity per week, avoidance of smoking, weight control (via diet and exercise), and continued surveillance/management of/for obstructive sleep apnea.    Medication Adjustments/Labs and Tests Ordered: Current medicines are reviewed at length with the patient today.  Concerns regarding medicines are outlined above.  Orders Placed This Encounter  Procedures  . Cardiac event monitor  . EKG 12-Lead  . ECHOCARDIOGRAM COMPLETE   Meds ordered this encounter  Medications  . losartan (COZAAR) 50 MG tablet    Sig: Take 1 tablet (50 mg total) by mouth daily.    Dispense:  90 tablet    Refill:  3    Patient Instructions  Medication Instructions:  1) I have sent in a new prescription for Losartan 50mg  tablets.  When you pick up the new prescription, make sure you only take one tablet.  *If you need a refill on your cardiac medications before your next appointment, please call your pharmacy*   Lab Work: None If you have labs (blood work) drawn today and your tests are completely normal, you will receive your results only by: Marland Kitchen MyChart Message (if you have MyChart) OR . A paper copy in the mail If you have any  lab test that is abnormal or we need to change your treatment, we will call you to review the results.   Testing/Procedures: Your  physician has recommended that you wear an event monitor. Event monitors are medical devices that record the heart's electrical activity. Doctors most often [place] these monitors to diagnose arrhythmias. Arrhythmias are problems with the speed or rhythm of the heartbeat. The monitor is a small, portable device. You can wear one while you do your normal daily activities. This is usually used to diagnose what is causing palpitations/syncope (passing out).  Your physician has requested that you have an echocardiogram 1-2 weeks prior to seeing Dr. Tamala Julian back in one year. Echocardiography is a painless test that uses sound waves to create images of your heart. It provides your doctor with information about the size and shape of your heart and how well your heart's chambers and valves are working. This procedure takes approximately one hour. There are no restrictions for this procedure.     Follow-Up: At [facility], you and your health needs are our priority.  As part of our continuing mission to provide you with exceptional heart care, we have created designated Provider Care Teams.  These Care Teams include your primary Cardiologist (physician) and Advanced Practice Providers (APPs -  Physician Assistants and Nurse Practitioners) who all work together to provide you with the care you need, when you need it.  We recommend signing up for the patient portal called "MyChart".  Sign up information is provided on this After Visit Summary.  MyChart is used to connect with patients for Virtual Visits (Telemedicine).  Patients are able to view lab/test results, encounter notes, upcoming appointments, etc.  Non-urgent messages can be sent to your provider as well.   To learn more about what you can do with MyChart, go to [email or web address].    Your next appointment:   12  month(s)  The format for your next appointment:   In Person  Provider:   You may see Sinclair Grooms, MD or one of the following Advanced Practice Providers on your designated Care Team:    Truitt Merle, NP  Cecilie Kicks, NP  Kathyrn Drown, NP    Other Instructions      Signed, Sinclair Grooms, MD  10/16/2020 8:37 AM    [place]

## 2020-10-16 ENCOUNTER — Other Ambulatory Visit: Payer: Self-pay

## 2020-10-16 ENCOUNTER — Encounter: Payer: Self-pay | Admitting: Interventional Cardiology

## 2020-10-16 ENCOUNTER — Telehealth: Payer: Self-pay | Admitting: Radiology

## 2020-10-16 ENCOUNTER — Ambulatory Visit (INDEPENDENT_AMBULATORY_CARE_PROVIDER_SITE_OTHER): Payer: Medicare Other | Admitting: Interventional Cardiology

## 2020-10-16 VITALS — BP 174/78 | HR 76 | Ht 68.5 in | Wt 179.6 lb

## 2020-10-16 DIAGNOSIS — E785 Hyperlipidemia, unspecified: Secondary | ICD-10-CM | POA: Diagnosis not present

## 2020-10-16 DIAGNOSIS — I10 Essential (primary) hypertension: Secondary | ICD-10-CM | POA: Diagnosis not present

## 2020-10-16 DIAGNOSIS — I25810 Atherosclerosis of coronary artery bypass graft(s) without angina pectoris: Secondary | ICD-10-CM

## 2020-10-16 DIAGNOSIS — R002 Palpitations: Secondary | ICD-10-CM | POA: Diagnosis not present

## 2020-10-16 DIAGNOSIS — Z7189 Other specified counseling: Secondary | ICD-10-CM | POA: Diagnosis not present

## 2020-10-16 DIAGNOSIS — I359 Nonrheumatic aortic valve disorder, unspecified: Secondary | ICD-10-CM

## 2020-10-16 MED ORDER — LOSARTAN POTASSIUM 50 MG PO TABS: 50.0000 mg | ORAL_TABLET | Freq: Every day | ORAL | 3 refills | Status: DC

## 2020-10-16 NOTE — Telephone Encounter (Signed)
Enrolled patient for a 30 day [employer] Event Monitor to be mailed to patients home  

## 2020-10-16 NOTE — Patient Instructions (Signed)
Medication Instructions:  1) I have sent in a new prescription for Losartan 50mg  tablets.  When you pick up the new prescription, make sure you only take one tablet.  *If you need a refill on your cardiac medications before your next appointment, please call your pharmacy*   Lab Work: None If you have labs (blood work) drawn today and your tests are completely normal, you will receive your results only by: Marland Kitchen MyChart Message (if you have MyChart) OR . A paper copy in the mail If you have any lab test that is abnormal or we need to change your treatment, we will call you to review the results.   Testing/Procedures: Your physician has recommended that you wear an event monitor. Event monitors are medical devices that record the heart's electrical activity. Doctors most often [place] these monitors to diagnose arrhythmias. Arrhythmias are problems with the speed or rhythm of the heartbeat. The monitor is a small, portable device. You can wear one while you do your normal daily activities. This is usually used to diagnose what is causing palpitations/syncope (passing out).  Your physician has requested that you have an echocardiogram 1-2 weeks prior to seeing Dr. Tamala Julian back in one year. Echocardiography is a painless test that uses sound waves to create images of your heart. It provides your doctor with information about the size and shape of your heart and how well your heart's chambers and valves are working. This procedure takes approximately one hour. There are no restrictions for this procedure.     Follow-Up: At [facility], you and your health needs are our priority.  As part of our continuing mission to provide you with exceptional heart care, we have created designated Provider Care Teams.  These Care Teams include your primary Cardiologist (physician) and Advanced Practice Providers (APPs -  Physician Assistants and Nurse Practitioners) who all work together to provide you with the care you  need, when you need it.  We recommend signing up for the patient portal called "MyChart".  Sign up information is provided on this After Visit Summary.  MyChart is used to connect with patients for Virtual Visits (Telemedicine).  Patients are able to view lab/test results, encounter notes, upcoming appointments, etc.  Non-urgent messages can be sent to your provider as well.   To learn more about what you can do with MyChart, go to [email or web address].    Your next appointment:   12 month(s)  The format for your next appointment:   In Person  Provider:   You may see Sinclair Grooms, MD or one of the following Advanced Practice Providers on your designated Care Team:    Truitt Merle, NP  Cecilie Kicks, NP  Kathyrn Drown, NP    Other Instructions

## 2020-10-19 DIAGNOSIS — M7551 Bursitis of right shoulder: Secondary | ICD-10-CM | POA: Insufficient documentation

## 2020-10-19 DIAGNOSIS — M65342 Trigger finger, left ring finger: Secondary | ICD-10-CM | POA: Insufficient documentation

## 2020-10-19 DIAGNOSIS — M13831 Other specified arthritis, right wrist: Secondary | ICD-10-CM | POA: Diagnosis not present

## 2020-10-20 DIAGNOSIS — M25552 Pain in left hip: Secondary | ICD-10-CM | POA: Diagnosis not present

## 2020-10-22 DIAGNOSIS — M25552 Pain in left hip: Secondary | ICD-10-CM | POA: Diagnosis not present

## 2020-10-24 ENCOUNTER — Ambulatory Visit (INDEPENDENT_AMBULATORY_CARE_PROVIDER_SITE_OTHER): Payer: Medicare Other

## 2020-10-24 DIAGNOSIS — R002 Palpitations: Secondary | ICD-10-CM | POA: Diagnosis not present

## 2020-10-27 DIAGNOSIS — M25552 Pain in left hip: Secondary | ICD-10-CM | POA: Diagnosis not present

## 2020-11-03 DIAGNOSIS — M25552 Pain in left hip: Secondary | ICD-10-CM | POA: Diagnosis not present

## 2020-11-10 DIAGNOSIS — L814 Other melanin hyperpigmentation: Secondary | ICD-10-CM | POA: Diagnosis not present

## 2020-11-10 DIAGNOSIS — M25552 Pain in left hip: Secondary | ICD-10-CM | POA: Diagnosis not present

## 2020-11-10 DIAGNOSIS — L820 Inflamed seborrheic keratosis: Secondary | ICD-10-CM | POA: Diagnosis not present

## 2020-11-10 DIAGNOSIS — D485 Neoplasm of uncertain behavior of skin: Secondary | ICD-10-CM | POA: Diagnosis not present

## 2020-11-10 DIAGNOSIS — D225 Melanocytic nevi of trunk: Secondary | ICD-10-CM | POA: Diagnosis not present

## 2020-11-10 DIAGNOSIS — Z85828 Personal history of other malignant neoplasm of skin: Secondary | ICD-10-CM | POA: Diagnosis not present

## 2020-11-10 DIAGNOSIS — L821 Other seborrheic keratosis: Secondary | ICD-10-CM | POA: Diagnosis not present

## 2020-11-10 DIAGNOSIS — Z85820 Personal history of malignant melanoma of skin: Secondary | ICD-10-CM | POA: Diagnosis not present

## 2020-11-10 DIAGNOSIS — L570 Actinic keratosis: Secondary | ICD-10-CM | POA: Diagnosis not present

## 2020-11-12 DIAGNOSIS — M25552 Pain in left hip: Secondary | ICD-10-CM | POA: Diagnosis not present

## 2020-11-16 DIAGNOSIS — M5416 Radiculopathy, lumbar region: Secondary | ICD-10-CM | POA: Insufficient documentation

## 2020-11-16 DIAGNOSIS — M25552 Pain in left hip: Secondary | ICD-10-CM | POA: Diagnosis not present

## 2020-11-20 DIAGNOSIS — M25561 Pain in right knee: Secondary | ICD-10-CM | POA: Diagnosis not present

## 2020-11-20 DIAGNOSIS — M7062 Trochanteric bursitis, left hip: Secondary | ICD-10-CM | POA: Diagnosis not present

## 2020-11-20 DIAGNOSIS — M5416 Radiculopathy, lumbar region: Secondary | ICD-10-CM | POA: Diagnosis not present

## 2020-11-20 DIAGNOSIS — S76311D Strain of muscle, fascia and tendon of the posterior muscle group at thigh level, right thigh, subsequent encounter: Secondary | ICD-10-CM | POA: Diagnosis not present

## 2020-11-20 DIAGNOSIS — M5459 Other low back pain: Secondary | ICD-10-CM | POA: Diagnosis not present

## 2020-11-26 DIAGNOSIS — M65342 Trigger finger, left ring finger: Secondary | ICD-10-CM | POA: Diagnosis not present

## 2020-11-26 DIAGNOSIS — M13839 Other specified arthritis, unspecified wrist: Secondary | ICD-10-CM | POA: Diagnosis not present

## 2020-11-26 DIAGNOSIS — M25519 Pain in unspecified shoulder: Secondary | ICD-10-CM | POA: Diagnosis not present

## 2020-12-16 DIAGNOSIS — M25552 Pain in left hip: Secondary | ICD-10-CM | POA: Diagnosis not present

## 2020-12-22 DIAGNOSIS — M5459 Other low back pain: Secondary | ICD-10-CM | POA: Diagnosis not present

## 2020-12-22 DIAGNOSIS — M5416 Radiculopathy, lumbar region: Secondary | ICD-10-CM | POA: Diagnosis not present

## 2021-01-06 DIAGNOSIS — M25552 Pain in left hip: Secondary | ICD-10-CM | POA: Diagnosis not present

## 2021-01-13 DIAGNOSIS — M7062 Trochanteric bursitis, left hip: Secondary | ICD-10-CM | POA: Diagnosis not present

## 2021-01-14 DIAGNOSIS — M5416 Radiculopathy, lumbar region: Secondary | ICD-10-CM | POA: Diagnosis not present

## 2021-01-14 DIAGNOSIS — M65342 Trigger finger, left ring finger: Secondary | ICD-10-CM | POA: Diagnosis not present

## 2021-01-14 DIAGNOSIS — I10 Essential (primary) hypertension: Secondary | ICD-10-CM | POA: Diagnosis not present

## 2021-01-14 DIAGNOSIS — M65331 Trigger finger, right middle finger: Secondary | ICD-10-CM | POA: Diagnosis not present

## 2021-01-14 DIAGNOSIS — G5601 Carpal tunnel syndrome, right upper limb: Secondary | ICD-10-CM | POA: Diagnosis not present

## 2021-01-14 DIAGNOSIS — Z6827 Body mass index (BMI) 27.0-27.9, adult: Secondary | ICD-10-CM | POA: Diagnosis not present

## 2021-01-14 DIAGNOSIS — M25531 Pain in right wrist: Secondary | ICD-10-CM | POA: Diagnosis not present

## 2021-01-14 DIAGNOSIS — M24231 Disorder of ligament, right wrist: Secondary | ICD-10-CM | POA: Diagnosis not present

## 2021-01-15 ENCOUNTER — Telehealth: Payer: Self-pay | Admitting: Pharmacist

## 2021-01-15 MED ORDER — REPATHA SURECLICK 140 MG/ML ~~LOC~~ SOAJ: 1.0000 "pen " | SUBCUTANEOUS | 0 refills | Status: DC

## 2021-01-15 NOTE — Telephone Encounter (Signed)
Patient called stating he had a mishap with his Repatha. I called [employer] product replacement and gave them a verbal for a replacement. He will come by today for a sample.

## 2021-01-21 DIAGNOSIS — M25552 Pain in left hip: Secondary | ICD-10-CM | POA: Diagnosis not present

## 2021-01-27 DIAGNOSIS — M25552 Pain in left hip: Secondary | ICD-10-CM | POA: Diagnosis not present

## 2021-02-10 DIAGNOSIS — M25552 Pain in left hip: Secondary | ICD-10-CM | POA: Diagnosis not present

## 2021-02-15 DIAGNOSIS — M25552 Pain in left hip: Secondary | ICD-10-CM | POA: Diagnosis not present

## 2021-02-18 DIAGNOSIS — M961 Postlaminectomy syndrome, not elsewhere classified: Secondary | ICD-10-CM | POA: Diagnosis not present

## 2021-02-18 DIAGNOSIS — M5412 Radiculopathy, cervical region: Secondary | ICD-10-CM | POA: Diagnosis not present

## 2021-02-18 DIAGNOSIS — M5416 Radiculopathy, lumbar region: Secondary | ICD-10-CM | POA: Diagnosis not present

## 2021-03-12 DIAGNOSIS — M25552 Pain in left hip: Secondary | ICD-10-CM | POA: Diagnosis not present

## 2021-03-30 ENCOUNTER — Other Ambulatory Visit (HOSPITAL_BASED_OUTPATIENT_CLINIC_OR_DEPARTMENT_OTHER): Payer: Self-pay

## 2021-03-31 ENCOUNTER — Other Ambulatory Visit (HOSPITAL_BASED_OUTPATIENT_CLINIC_OR_DEPARTMENT_OTHER): Payer: Self-pay

## 2021-03-31 ENCOUNTER — Ambulatory Visit: Payer: Medicare Other | Attending: Internal Medicine

## 2021-03-31 ENCOUNTER — Other Ambulatory Visit: Payer: Self-pay

## 2021-03-31 DIAGNOSIS — Z23 Encounter for immunization: Secondary | ICD-10-CM

## 2021-03-31 MED ORDER — PFIZER-BIONT COVID-19 VAC-TRIS 30 MCG/0.3ML IM SUSP
INTRAMUSCULAR | 0 refills | Status: DC
  Filled 2021-03-31: qty 0.3, 1d supply, fill #0

## 2021-03-31 NOTE — Progress Notes (Signed)
   Covid-19 Vaccination Clinic  Name:  Nathan [name]    MRN: [id number] DOB: 03-28-1946  03/31/2021  [name] was observed post Covid-19 immunization for 15 minutes without incident. He was provided with Vaccine Information Sheet and instruction to access the V-Safe system.   [name] was instructed to call 911 with any severe reactions post vaccine: Marland Kitchen Difficulty breathing  . Swelling of face and throat  . A fast heartbeat  . A bad rash all over body  . Dizziness and weakness   Immunizations Administered    Name Date Dose VIS Date Route   PFIZER Comrnaty(Gray TOP) Covid-19 Vaccine 03/31/2021 11:16 AM 0.3 mL 11/19/2020 Intramuscular   Manufacturer: [employer]   Lot: [id number]   [place]: [phone]

## 2021-04-05 DIAGNOSIS — M25552 Pain in left hip: Secondary | ICD-10-CM | POA: Diagnosis not present

## 2021-04-05 DIAGNOSIS — M5459 Other low back pain: Secondary | ICD-10-CM | POA: Diagnosis not present

## 2021-04-15 DIAGNOSIS — H35372 Puckering of macula, left eye: Secondary | ICD-10-CM | POA: Diagnosis not present

## 2021-04-19 DIAGNOSIS — M79652 Pain in left thigh: Secondary | ICD-10-CM | POA: Diagnosis not present

## 2021-05-12 DIAGNOSIS — D225 Melanocytic nevi of trunk: Secondary | ICD-10-CM | POA: Diagnosis not present

## 2021-05-12 DIAGNOSIS — L570 Actinic keratosis: Secondary | ICD-10-CM | POA: Diagnosis not present

## 2021-05-12 DIAGNOSIS — Z85828 Personal history of other malignant neoplasm of skin: Secondary | ICD-10-CM | POA: Diagnosis not present

## 2021-05-12 DIAGNOSIS — Z85820 Personal history of malignant melanoma of skin: Secondary | ICD-10-CM | POA: Diagnosis not present

## 2021-05-12 DIAGNOSIS — L218 Other seborrheic dermatitis: Secondary | ICD-10-CM | POA: Diagnosis not present

## 2021-05-12 DIAGNOSIS — L821 Other seborrheic keratosis: Secondary | ICD-10-CM | POA: Diagnosis not present

## 2021-05-12 DIAGNOSIS — L814 Other melanin hyperpigmentation: Secondary | ICD-10-CM | POA: Diagnosis not present

## 2021-05-12 DIAGNOSIS — D1801 Hemangioma of skin and subcutaneous tissue: Secondary | ICD-10-CM | POA: Diagnosis not present

## 2021-05-12 DIAGNOSIS — L820 Inflamed seborrheic keratosis: Secondary | ICD-10-CM | POA: Diagnosis not present

## 2021-05-19 DIAGNOSIS — S76309A Unspecified injury of muscle, fascia and tendon of the posterior muscle group at thigh level, unspecified thigh, initial encounter: Secondary | ICD-10-CM | POA: Diagnosis not present

## 2021-05-19 DIAGNOSIS — M25552 Pain in left hip: Secondary | ICD-10-CM | POA: Diagnosis not present

## 2021-05-19 DIAGNOSIS — M7070 Other bursitis of hip, unspecified hip: Secondary | ICD-10-CM | POA: Insufficient documentation

## 2021-05-19 DIAGNOSIS — M7072 Other bursitis of hip, left hip: Secondary | ICD-10-CM | POA: Diagnosis not present

## 2021-05-31 DIAGNOSIS — M25552 Pain in left hip: Secondary | ICD-10-CM | POA: Diagnosis not present

## 2021-06-01 DIAGNOSIS — M25552 Pain in left hip: Secondary | ICD-10-CM | POA: Diagnosis not present

## 2021-06-06 DIAGNOSIS — I10 Essential (primary) hypertension: Secondary | ICD-10-CM | POA: Diagnosis not present

## 2021-06-06 DIAGNOSIS — L309 Dermatitis, unspecified: Secondary | ICD-10-CM | POA: Diagnosis not present

## 2021-06-06 DIAGNOSIS — L03211 Cellulitis of face: Secondary | ICD-10-CM | POA: Diagnosis not present

## 2021-06-09 DIAGNOSIS — Z85828 Personal history of other malignant neoplasm of skin: Secondary | ICD-10-CM | POA: Diagnosis not present

## 2021-06-09 DIAGNOSIS — L011 Impetiginization of other dermatoses: Secondary | ICD-10-CM | POA: Diagnosis not present

## 2021-06-09 DIAGNOSIS — L0889 Other specified local infections of the skin and subcutaneous tissue: Secondary | ICD-10-CM | POA: Diagnosis not present

## 2021-06-09 DIAGNOSIS — L218 Other seborrheic dermatitis: Secondary | ICD-10-CM | POA: Diagnosis not present

## 2021-06-09 DIAGNOSIS — L821 Other seborrheic keratosis: Secondary | ICD-10-CM | POA: Diagnosis not present

## 2021-06-12 DIAGNOSIS — J029 Acute pharyngitis, unspecified: Secondary | ICD-10-CM | POA: Diagnosis not present

## 2021-06-24 ENCOUNTER — Telehealth: Payer: Self-pay

## 2021-06-24 NOTE — Telephone Encounter (Signed)
Faxed repatha appeal

## 2021-06-28 ENCOUNTER — Telehealth: Payer: Self-pay | Admitting: Interventional Cardiology

## 2021-06-28 NOTE — Telephone Encounter (Signed)
Pt returning your call , if unable to reach contact pt at   [phone]

## 2021-06-28 NOTE — Telephone Encounter (Signed)
Pt just returned from a 2 week trip to [place].  Noticed after being there for about 4 days that he started to develop swelling in BLE.  Improves overnight but returns.  Was worse last week but looks better now.  Also mentioned that he has had increased bruising.  Pt states that he has been having hip pain and PCP had him taking Aleve BID for months now but he stopped that on 7/15.  Went to urgent care just prior to going to [place] due to an area of eczema in his ear draining.  Was started on antibiotic and prednisone for this.  BP at first UC visit was 198/88.  At second visit it was 155/69.  At home was 138/72 which pt states it about normal for him.  Also noticed episodes of heart racing intermittently while he was on his trip.  Denies pain in legs.  Denies CP or SOB.  Advised I will send to Dr. Tamala Julian for review and advisement.

## 2021-06-28 NOTE — Telephone Encounter (Signed)
Pt c/o swelling: STAT is pt has developed SOB within 24 hours  If swelling, where is the swelling located? Feet and ankles  How much weight have you gained and in what time span? Not sure.   Have you gained 3 pounds in a day or 5 pounds in a week? Patient does not weigh himself   Do you have a log of your daily weights (if so, list)? No. Patient did not weigh himself while in [place]   Are you currently taking a fluid pill? no  Are you currently SOB? no  Have you traveled recently? Yes. Patient just got back from [place] last night   Patient was in [place] for 3 weeks. He has not experienced swelling in his feet like this before.   Patient also states he woke up sometimes in the middle of the night and had a fast heart rate. While he was gone he did not have a way to check his HR.

## 2021-06-28 NOTE — Telephone Encounter (Signed)
Left message to call back  

## 2021-06-29 NOTE — Telephone Encounter (Signed)
Spoke with pt and made him aware of recommendations from Dr. Tamala Julian.  Pt agreeable to plan.

## 2021-06-29 NOTE — Telephone Encounter (Signed)
Left message to call back  

## 2021-07-01 NOTE — Telephone Encounter (Signed)
Called and lmomed the pcp to send recent lipid panel

## 2021-07-01 NOTE — Telephone Encounter (Addendum)
Pt called clinic stating he received denial letter for Repatha on 7/12. Denial reason stating that pt didn't have recent labs showing 40% LDL reduction on therapy. Reports he has 1 dose of Repatha left and then will be out. He's having lipids checked at PCP office 7/26.

## 2021-07-04 DIAGNOSIS — Z20822 Contact with and (suspected) exposure to covid-19: Secondary | ICD-10-CM | POA: Diagnosis not present

## 2021-07-05 DIAGNOSIS — L821 Other seborrheic keratosis: Secondary | ICD-10-CM | POA: Diagnosis not present

## 2021-07-05 DIAGNOSIS — L218 Other seborrheic dermatitis: Secondary | ICD-10-CM | POA: Diagnosis not present

## 2021-07-05 DIAGNOSIS — M25562 Pain in left knee: Secondary | ICD-10-CM | POA: Diagnosis not present

## 2021-07-05 DIAGNOSIS — M25559 Pain in unspecified hip: Secondary | ICD-10-CM | POA: Diagnosis not present

## 2021-07-05 DIAGNOSIS — M5416 Radiculopathy, lumbar region: Secondary | ICD-10-CM | POA: Diagnosis not present

## 2021-07-05 DIAGNOSIS — Z85828 Personal history of other malignant neoplasm of skin: Secondary | ICD-10-CM | POA: Diagnosis not present

## 2021-07-06 ENCOUNTER — Other Ambulatory Visit: Payer: Self-pay | Admitting: Orthopedic Surgery

## 2021-07-06 DIAGNOSIS — Z125 Encounter for screening for malignant neoplasm of prostate: Secondary | ICD-10-CM | POA: Diagnosis not present

## 2021-07-06 DIAGNOSIS — M48061 Spinal stenosis, lumbar region without neurogenic claudication: Secondary | ICD-10-CM

## 2021-07-06 DIAGNOSIS — E785 Hyperlipidemia, unspecified: Secondary | ICD-10-CM | POA: Diagnosis not present

## 2021-07-06 DIAGNOSIS — R7301 Impaired fasting glucose: Secondary | ICD-10-CM | POA: Diagnosis not present

## 2021-07-07 ENCOUNTER — Telehealth: Payer: Self-pay

## 2021-07-07 NOTE — Telephone Encounter (Signed)
Called and lmomed the pcp we need recent lipid panel asap to appeal pa denial for repatha

## 2021-07-08 ENCOUNTER — Ambulatory Visit
Admission: RE | Admit: 2021-07-08 | Discharge: 2021-07-08 | Disposition: A | Discharge status: Home / Self Care | Payer: Medicare Other | Source: Ambulatory Visit | Attending: Orthopedic Surgery | Admitting: Orthopedic Surgery

## 2021-07-08 ENCOUNTER — Other Ambulatory Visit: Payer: Self-pay

## 2021-07-08 DIAGNOSIS — M48061 Spinal stenosis, lumbar region without neurogenic claudication: Secondary | ICD-10-CM

## 2021-07-08 DIAGNOSIS — M5126 Other intervertebral disc displacement, lumbar region: Secondary | ICD-10-CM | POA: Diagnosis not present

## 2021-07-08 MED ORDER — DIAZEPAM 5 MG PO TABS
5.0000 mg | ORAL_TABLET | Freq: Once | ORAL | Status: AC
  Administered 2021-07-08: 5 mg via ORAL

## 2021-07-08 NOTE — Telephone Encounter (Signed)
Updated labs received from [facility] 07/06/21: TC 133, TG 141, HDL 55, LDL 50  Labs have been faxed to insurance at [phone] for [place] authorization (previously denied because pt hadn't had updated labs in a year).

## 2021-07-08 NOTE — Discharge Instructions (Signed)
Myelogram Discharge Instructions  Go home and rest quietly as needed. You may resume normal activities; however, do not exert yourself strongly or do any heavy lifting today and tomorrow.   DO NOT drive today.    You may resume your normal diet and medications unless otherwise indicated. Drink lots of extra fluids today and tomorrow.   The incidence of headache, nausea, or vomiting is about 5% (one in 20 patients).  If you develop a headache, lie flat for 24 hours and drink plenty of fluids until the headache goes away.  Caffeinated beverages may be helpful. If when you get up you still have a headache when standing, go back to bed and force fluids for another 24 hours.   If you develop severe nausea and vomiting or a headache that does not go away with the flat bedrest after 48 hours, please call [phone].   Call your physician for a follow-up appointment.  The results of your myelogram will be sent directly to your physician by the following day.  If you have any questions or if complications develop after you arrive home, please call [phone].  Discharge instructions have been explained to the patient.  The patient, or the person responsible for the patient, fully understands these instructions.   Thank you for visiting our office today.    YOU MAY RESUME YOUR ASPIRIN TODAY AFTER THE PROCEDURE.

## 2021-07-09 DIAGNOSIS — M545 Low back pain, unspecified: Secondary | ICD-10-CM | POA: Diagnosis not present

## 2021-07-09 DIAGNOSIS — M25562 Pain in left knee: Secondary | ICD-10-CM | POA: Diagnosis not present

## 2021-07-12 ENCOUNTER — Other Ambulatory Visit: Payer: Self-pay | Admitting: Pharmacist

## 2021-07-12 MED ORDER — REPATHA SURECLICK 140 MG/ML ~~LOC~~ SOAJ: SUBCUTANEOUS | 3 refills | Status: DC

## 2021-07-22 DIAGNOSIS — M5416 Radiculopathy, lumbar region: Secondary | ICD-10-CM | POA: Diagnosis not present

## 2021-07-27 DIAGNOSIS — I10 Essential (primary) hypertension: Secondary | ICD-10-CM | POA: Diagnosis not present

## 2021-07-27 DIAGNOSIS — M25552 Pain in left hip: Secondary | ICD-10-CM | POA: Diagnosis not present

## 2021-07-27 DIAGNOSIS — R7301 Impaired fasting glucose: Secondary | ICD-10-CM | POA: Diagnosis not present

## 2021-07-27 DIAGNOSIS — Z0000 Encounter for general adult medical examination without abnormal findings: Secondary | ICD-10-CM | POA: Diagnosis not present

## 2021-07-27 DIAGNOSIS — I25810 Atherosclerosis of coronary artery bypass graft(s) without angina pectoris: Secondary | ICD-10-CM | POA: Diagnosis not present

## 2021-07-27 DIAGNOSIS — I351 Nonrheumatic aortic (valve) insufficiency: Secondary | ICD-10-CM | POA: Diagnosis not present

## 2021-07-27 DIAGNOSIS — G4700 Insomnia, unspecified: Secondary | ICD-10-CM | POA: Diagnosis not present

## 2021-07-27 DIAGNOSIS — Z1331 Encounter for screening for depression: Secondary | ICD-10-CM | POA: Diagnosis not present

## 2021-07-27 DIAGNOSIS — E785 Hyperlipidemia, unspecified: Secondary | ICD-10-CM | POA: Diagnosis not present

## 2021-07-27 DIAGNOSIS — M48061 Spinal stenosis, lumbar region without neurogenic claudication: Secondary | ICD-10-CM | POA: Diagnosis not present

## 2021-07-27 DIAGNOSIS — I700 Atherosclerosis of aorta: Secondary | ICD-10-CM | POA: Diagnosis not present

## 2021-07-27 DIAGNOSIS — R82998 Other abnormal findings in urine: Secondary | ICD-10-CM | POA: Diagnosis not present

## 2021-07-27 DIAGNOSIS — Z1339 Encounter for screening examination for other mental health and behavioral disorders: Secondary | ICD-10-CM | POA: Diagnosis not present

## 2021-08-05 ENCOUNTER — Other Ambulatory Visit: Payer: Self-pay | Admitting: Orthopedic Surgery

## 2021-08-05 DIAGNOSIS — M5416 Radiculopathy, lumbar region: Secondary | ICD-10-CM | POA: Diagnosis not present

## 2021-08-05 DIAGNOSIS — M25552 Pain in left hip: Secondary | ICD-10-CM | POA: Diagnosis not present

## 2021-08-06 ENCOUNTER — Ambulatory Visit
Admission: RE | Admit: 2021-08-06 | Discharge: 2021-08-06 | Disposition: A | Discharge status: Home / Self Care | Payer: Medicare Other | Source: Ambulatory Visit | Attending: Orthopedic Surgery | Admitting: Orthopedic Surgery

## 2021-08-06 ENCOUNTER — Other Ambulatory Visit: Payer: Self-pay

## 2021-08-06 DIAGNOSIS — M48061 Spinal stenosis, lumbar region without neurogenic claudication: Secondary | ICD-10-CM | POA: Diagnosis not present

## 2021-08-06 DIAGNOSIS — M25552 Pain in left hip: Secondary | ICD-10-CM

## 2021-08-06 DIAGNOSIS — R531 Weakness: Secondary | ICD-10-CM | POA: Diagnosis not present

## 2021-08-06 MED ORDER — IOPAMIDOL (ISOVUE-M 200) INJECTION 41%
1.0000 mL | Freq: Once | INTRAMUSCULAR | Status: AC
  Administered 2021-08-06: 1 mL via INTRA_ARTICULAR

## 2021-08-06 MED ORDER — METHYLPREDNISOLONE ACETATE 40 MG/ML INJ SUSP (RADIOLOG
80.0000 mg | Freq: Once | INTRAMUSCULAR | Status: AC
  Administered 2021-08-06: 80 mg via INTRA_ARTICULAR

## 2021-08-27 DIAGNOSIS — I10 Essential (primary) hypertension: Secondary | ICD-10-CM | POA: Diagnosis not present

## 2021-08-27 DIAGNOSIS — Z23 Encounter for immunization: Secondary | ICD-10-CM | POA: Diagnosis not present

## 2021-08-27 DIAGNOSIS — M545 Low back pain, unspecified: Secondary | ICD-10-CM | POA: Insufficient documentation

## 2021-08-31 DIAGNOSIS — M1712 Unilateral primary osteoarthritis, left knee: Secondary | ICD-10-CM | POA: Diagnosis not present

## 2021-09-02 ENCOUNTER — Ambulatory Visit (INDEPENDENT_AMBULATORY_CARE_PROVIDER_SITE_OTHER): Payer: Medicare Other | Admitting: Otolaryngology

## 2021-09-02 ENCOUNTER — Other Ambulatory Visit: Payer: Self-pay

## 2021-09-02 DIAGNOSIS — J310 Chronic rhinitis: Secondary | ICD-10-CM

## 2021-09-02 DIAGNOSIS — H6123 Impacted cerumen, bilateral: Secondary | ICD-10-CM | POA: Diagnosis not present

## 2021-09-02 NOTE — Progress Notes (Signed)
HPI: Nathan [name] is a 75 y.o. male who presents is referred by his PCP for evaluation of recent right ear infection that is doing much better today. He also has noticed some staining of his teeth at his dentist is not sure what caused this and thinks it might be coming from the sinus region.  He has had occasional sore throat has been treated with reflux medication.  He has had his teeth cleaned recently and the dentist is not sure why they get discoloration so quickly.. Concerning his ear infection on the right side he has history of eczema and apparently developed a secondary cellulitis that was treated with antibiotic ointment as well as doxycycline.  This is doing much better today.  Past Medical History:  Diagnosis Date   Arthritis    Blood transfusion without reported diagnosis    Complication of anesthesia    jittery after surgery   Coronary artery disease    GERD (gastroesophageal reflux disease)    Hyperlipemia    Hypertension    Right carpal tunnel syndrome 02/08/2017   Seasonal allergies    Wears glasses    Past Surgical History:  Procedure Laterality Date   APPENDECTOMY     age 31   COLONOSCOPY     COLONOSCOPY WITH PROPOFOL N/A 02/15/2016   Procedure: COLONOSCOPY WITH PROPOFOL;  Surgeon: Garlan Fair, MD;  Location: [facility];  Service: Endoscopy;  Laterality: N/A;   CORONARY ARTERY BYPASS GRAFT     2003   ESOPHAGOGASTRODUODENOSCOPY (EGD) WITH PROPOFOL N/A 02/15/2016   Procedure: ESOPHAGOGASTRODUODENOSCOPY (EGD) WITH PROPOFOL;  Surgeon: Garlan Fair, MD;  Location: [facility];  Service: Endoscopy;  Laterality: N/A;   EXPLORATION POST OPERATIVE OPEN HEART     EYE SURGERY     detached retina-lt   INGUINAL HERNIA REPAIR Left 04/01/2014   Procedure: HERNIA REPAIR INGUINAL ADULT;  Surgeon: Shann Medal, MD;  Location: [facility];  Service: General;  Laterality: Left;   INSERTION OF MESH Left 04/01/2014   Procedure: INSERTION OF MESH;  Surgeon: Shann Medal, MD;  Location: [facility];  Service: General;  Laterality: Left;   KNEE ARTHROCENTESIS  1980   right   SHOULDER ACROMIOPLASTY  1985   rt   SINUS ENDO W/FUSION     TONSILLECTOMY AND ADENOIDECTOMY     Social History   Socioeconomic History   Marital status: Widowed    Spouse name: Not on file   Number of children: Not on file   Years of education: post grad   Highest education level: Not on file  Occupational History   Occupation: [employer]  Tobacco Use   Smoking status: Former    Types: Cigarettes    Quit date: 03/26/1969    Years since quitting: 52.4   Smokeless tobacco: Never  Substance and Sexual Activity   Alcohol use: Yes    Alcohol/week: 0.0 standard drinks    Comment: 2 per day   Drug use: No   Sexual activity: Not on file  Other Topics Concern   Not on file  Social History Narrative   Patient drinks 1-2 cups of caffeine daily.   Patient is left handed.    Social Determinants of Health   Financial Resource Strain: Not on file  Food Insecurity: Not on file  Transportation Needs: Not on file  Physical Activity: Not on file  Stress: Not on file  Social Connections: Not on file   Family History  Problem Relation  Age of Onset   Heart disease Mother    Cancer Father    Alcohol abuse Brother    Kidney failure Brother    Allergies  Allergen Reactions   Bystolic [Nebivolol Hcl] Rash   Lexiscan [Regadenoson] Other (See Comments)    Sharp sternal pain   Triamcinolone Acetonide Acetate [Triamcinolone] Hives   Zetia [Ezetimibe] Diarrhea    GI effects - bloating, cramping, diarrhea on 5mg  and 10mg  dose   Amlodipine    Lipitor [Atorvastatin] Rash   Lisinopril Nausea Only    GI upset   Neosporin [Neomycin-Bacitracin Zn-Polymyx] Rash   Pravastatin Rash   Toprol Xl [Metoprolol Tartrate] Rash   Prior to Admission medications   Medication Sig Start Date End Date Taking? Authorizing Provider  aspirin EC 81 MG tablet Take 81 mg by mouth daily.     [provider]  cetirizine (ZYRTEC) 10 MG tablet Take 10 mg by mouth daily.    [provider]  COVID-19 mRNA Vac-TriS, Pfizer, (PFIZER-BIONT COVID-19 VAC-TRIS) SUSP injection Inject into the muscle. 03/31/21   Carlyle Basques, MD  cycloSPORINE (RESTASIS) 0.05 % ophthalmic emulsion Place 1 drop into both eyes 2 (two) times daily.     [provider]  Evolocumab (REPATHA SURECLICK) 841 MG/ML SOAJ INJECT 1 PEN UNDER THE SKIN (SUBCUTANEOUS INJECTION) EVERY 14 DAYS. 07/12/21   Belva Crome, MD  flurazepam [facility]) 30 MG capsule Take 30 mg by mouth at bedtime as needed for sleep.    [provider]  losartan (COZAAR) 50 MG tablet Take 1 tablet (50 mg total) by mouth daily. 10/16/20 01/14/21  Belva Crome, MD  triamcinolone (NASACORT) 55 MCG/ACT AERO nasal inhaler Place 2 sprays into the nose daily.    [provider]     Positive ROS: Otherwise negative  All other systems have been reviewed and were otherwise negative with the exception of those mentioned in the HPI and as above.  Physical Exam: Constitutional: Alert, well-appearing, no acute distress Ears: External ears without lesions or tenderness.  On microscopic exam today he had a little bit of wax in both ear canals that was cleaned with forceps.  But no significant eczema or signs of external otitis.  Both TMs are clear.  Nasal: External nose without lesions. Septum is midline.  On nasal endoscopy the middle meatus regions are clear with no mucopurulent discharge noted.  The right middle meatus is slightly more edematous than the left but again no mucopurulent discharge noted.  The posterior ethmoid area the sphenoid region is clear with no significant drainage noted posteriorly nasopharynx is clear.  The fiberoptic laryngoscope was passed through the left nostril down into the hypopharynx and larynx and the vocal cords were clear bilaterally with normal vocal mobility.  The base of tongue vallecula  epiglottis were normal..  Oral: Lips and gums without lesions. Tongue and palate mucosa without lesions. Posterior oropharynx clear.  Patient is status post tonsillectomy.  Normal-appearing oral mucosa with no mucosal lesions noted and no signs of thrush or infection. Neck: No palpable adenopathy or masses Respiratory: Breathing comfortably  Skin: No facial/neck lesions or rash noted.  Cerumen impaction removal  Date/Time: 09/02/2021 5:13 PM Performed by: Rozetta Nunnery, MD Authorized by: Rozetta Nunnery, MD   Consent:    Consent obtained:  Verbal   Consent given by:  Patient   Risks discussed:  Pain and bleeding Procedure details:    Location:  L ear and R ear   Procedure type:  forceps   Post-procedure details:    Inspection:  TM intact and canal normal   Hearing quality:  Improved   Procedure completion:  Tolerated well, no immediate complications Comments:     Patient with minimal wax buildup in both ear canals that was cleaned with forceps.  No inflammatory changes of either ear canal noted and TMs were clear.  Assessment: Mild rhinitis but no clinical evidence of active infection within the sinuses with minimal clear mucus discharge from the nasal cavity on nasal endoscopy performed today. Minimal wax buildup with no signs of eczema or external otitis on exam today. Questionable etiology of discoloration of his dentition.  Plan: Reassured patient of clear sinuses on nasal endoscopy and no evidence of infection.  Would recommend follow-up with his dentist concerning the cause of discoloration of his teeth as there is no clinical evidence of sinus disease or infection causing this. Clear hypopharynx and larynx with normal vocal cords on fiberoptic laryngoscopy. Concerning the problems with eczema of the ear would recommend use of the steroid cream as needed crusting or itching in the ear use of the antibiotic cream if develops any swelling pain or erythema. He will  follow-up here as needed.   Radene Journey, MD   CC:

## 2021-09-13 DIAGNOSIS — M25562 Pain in left knee: Secondary | ICD-10-CM | POA: Diagnosis not present

## 2021-09-27 DIAGNOSIS — M79642 Pain in left hand: Secondary | ICD-10-CM | POA: Diagnosis not present

## 2021-09-27 DIAGNOSIS — M79641 Pain in right hand: Secondary | ICD-10-CM | POA: Diagnosis not present

## 2021-09-27 DIAGNOSIS — M25562 Pain in left knee: Secondary | ICD-10-CM | POA: Diagnosis not present

## 2021-09-27 DIAGNOSIS — M25531 Pain in right wrist: Secondary | ICD-10-CM | POA: Diagnosis not present

## 2021-09-30 DIAGNOSIS — M25562 Pain in left knee: Secondary | ICD-10-CM | POA: Diagnosis not present

## 2021-10-04 DIAGNOSIS — M25562 Pain in left knee: Secondary | ICD-10-CM | POA: Diagnosis not present

## 2021-10-06 ENCOUNTER — Other Ambulatory Visit: Payer: Self-pay

## 2021-10-06 ENCOUNTER — Ambulatory Visit (HOSPITAL_COMMUNITY): Payer: Medicare Other | Attending: Cardiology

## 2021-10-06 DIAGNOSIS — I359 Nonrheumatic aortic valve disorder, unspecified: Secondary | ICD-10-CM | POA: Diagnosis not present

## 2021-10-06 LAB — ECHOCARDIOGRAM COMPLETE
AR max vel: 1.9 cm2
AV Area VTI: 1.87 cm2
AV Area mean vel: 1.9 cm2
AV Mean grad: 16 mmHg
AV Peak grad: 28.7 mmHg
Ao pk vel: 2.68 m/s
Area-P 1/2: 3.12 cm2
P 1/2 time: 425 msec
S' Lateral: 3.5 cm

## 2021-10-06 MED ORDER — PERFLUTREN LIPID MICROSPHERE
1.0000 mL | INTRAVENOUS | Status: AC | PRN
  Administered 2021-10-06: 1 mL via INTRAVENOUS

## 2021-10-07 ENCOUNTER — Telehealth: Payer: Self-pay | Admitting: Interventional Cardiology

## 2021-10-07 DIAGNOSIS — M25562 Pain in left knee: Secondary | ICD-10-CM | POA: Diagnosis not present

## 2021-10-07 NOTE — Telephone Encounter (Signed)
Patient was calling in bout his echo results to see if the nurse/dr has change to review it. Please advise

## 2021-10-07 NOTE — Telephone Encounter (Signed)
Nathan Crome, MD  10/06/2021  1:06 PM EDT     Let the patient know he is developing calcific aortic stenosis and regurgitation. Both are currently mild.Will discuss in more detail at [name] will be sent to Ginger Organ., MD   Informed patient of results as written above.

## 2021-10-11 DIAGNOSIS — M25562 Pain in left knee: Secondary | ICD-10-CM | POA: Diagnosis not present

## 2021-10-12 DIAGNOSIS — M25532 Pain in left wrist: Secondary | ICD-10-CM | POA: Diagnosis not present

## 2021-10-13 DIAGNOSIS — M25562 Pain in left knee: Secondary | ICD-10-CM | POA: Diagnosis not present

## 2021-10-20 NOTE — Progress Notes (Signed)
Cardiology Office Note:    Date:  10/21/2021   ID:  LIEM COPENHAVER, DOB 12-29-1945, MRN [id number]  PCP:  Ginger Organ., MD  Cardiologist:  Sinclair Grooms, MD   Referring MD: Ginger Organ., MD   Chief Complaint  Patient presents with   Coronary Artery Disease   Cardiac Valve Problem    Combined AS and AR     History of Present Illness:    PEDRO WHITERS is a 75 y.o. male with a hx of CABG 2003, hypertension, hyperlipidemia, and family history of CAD. Now having increasing difficulty with medication intolerances including beta blockers, statins, and he feels Lexiscan also cause chest pain.    No cardiac symptoms.  Exercises been limited due to left hip, knee, back, and difficulty.  He has SLAC wrist.  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  This causes significant pain and he has had a couple falls.  He denies orthopnea, PND, chest pain, palpitations, and syncope.  Past Medical History:  Diagnosis Date   Arthritis    Blood transfusion without reported diagnosis    Complication of anesthesia    jittery after surgery   Coronary artery disease    GERD (gastroesophageal reflux disease)    Hyperlipemia    Hypertension    Right carpal tunnel syndrome 02/08/2017   Seasonal allergies    Wears glasses     Past Surgical History:  Procedure Laterality Date   APPENDECTOMY     age 62   COLONOSCOPY     COLONOSCOPY WITH PROPOFOL N/A 02/15/2016   Procedure: COLONOSCOPY WITH PROPOFOL;  Surgeon: Garlan Fair, MD;  [facility];  Service: Endoscopy;  Laterality: N/A;   CORONARY ARTERY BYPASS GRAFT     2003   ESOPHAGOGASTRODUODENOSCOPY (EGD) WITH PROPOFOL N/A 02/15/2016   Procedure: ESOPHAGOGASTRODUODENOSCOPY (EGD) WITH PROPOFOL;  Surgeon: Garlan Fair, MD;  [facility];  Service: Endoscopy;  Laterality: N/A;   EXPLORATION POST OPERATIVE OPEN HEART     EYE SURGERY     detached retina-lt   INGUINAL HERNIA REPAIR  Left 04/01/2014   Procedure: HERNIA REPAIR INGUINAL ADULT;  Surgeon: Shann Medal, MD;  Location: [facility];  Service: General;  Laterality: Left;   INSERTION OF MESH Left 04/01/2014   Procedure: INSERTION OF MESH;  Surgeon: Shann Medal, MD;  Location: [facility];  Service: General;  Laterality: Left;   KNEE ARTHROCENTESIS  1980   right   SHOULDER ACROMIOPLASTY  1985   rt   SINUS ENDO W/FUSION     TONSILLECTOMY AND ADENOIDECTOMY      Current Medications: Current Meds  Medication Sig   amlodipine-olmesartan (AZOR) 10-20 MG tablet Take 1 tablet by mouth daily.   aspirin EC 81 MG tablet Take 81 mg by mouth daily.   cetirizine (ZYRTEC) 10 MG tablet Take 10 mg by mouth daily.   COVID-19 mRNA Vac-TriS, Pfizer, (PFIZER-BIONT COVID-19 VAC-TRIS) SUSP injection Inject into the muscle.   cycloSPORINE (RESTASIS) 0.05 % ophthalmic emulsion Place 1 drop into both eyes 2 (two) times daily.    Evolocumab (REPATHA SURECLICK) 267 MG/ML SOAJ INJECT 1 PEN UNDER THE SKIN (SUBCUTANEOUS INJECTION) EVERY 14 DAYS.   flurazepam (DALMANE) 30 MG capsule Take 30 mg by mouth at bedtime as needed for sleep.   GENTEAL 0.25-0.3 % GEL Apply to eye daily.   OLMESARTAN MEDOXOMIL PO Take 20 mg by mouth daily.  SYSTANE 0.4-0.3 % SOLN Apply to eye as needed.   triamcinolone (NASACORT) 55 MCG/ACT AERO nasal inhaler Place 2 sprays into the nose daily.     Allergies:   Bystolic [nebivolol hcl], Lexiscan [regadenoson], Triamcinolone acetonide acetate [triamcinolone], Zetia [ezetimibe], Amlodipine, Lipitor [atorvastatin], Lisinopril, Neosporin [neomycin-bacitracin zn-polymyx], Pravastatin, and Toprol xl [metoprolol tartrate]   Social History   Socioeconomic History   Marital status: Widowed    Spouse name: Not on file   Number of children: Not on file   Years of education: post grad   Highest education level: Not on file  Occupational History   Occupation: [employer]  Tobacco Use   Smoking  status: Former    Types: Cigarettes    Quit date: 03/26/1969    Years since quitting: 52.6   Smokeless tobacco: Never  Substance and Sexual Activity   Alcohol use: Yes    Alcohol/week: 0.0 standard drinks    Comment: 2 per day   Drug use: No   Sexual activity: Not on file  Other Topics Concern   Not on file  Social History Narrative   Patient drinks 1-2 cups of caffeine daily.   Patient is left handed.    Social Determinants of Health   Financial Resource Strain: Not on file  Food Insecurity: Not on file  Transportation Needs: Not on file  Physical Activity: Not on file  Stress: Not on file  Social Connections: Not on file     Family History: The patient's family history includes Alcohol abuse in his brother; Cancer in his father; Heart disease in his mother; Kidney failure in his brother.  ROS:   Please see the history of present illness.    Had lower extremity swelling after a transatlantic flight.  It resolved spontaneously.  All other systems reviewed and are negative.  EKGs/Labs/Other Studies Reviewed:    The following studies were reviewed today:  2D Doppler echocardiogram October 2022 IMPRESSIONS   1. Left ventricular ejection fraction, by estimation, is 60 to 65%. The  left ventricle has normal function. The left ventricle has no regional  wall motion abnormalities. There is mild concentric left ventricular  hypertrophy. Left ventricular diastolic  parameters are consistent with Grade I diastolic dysfunction (impaired  relaxation).   2. Right ventricular systolic function is normal. The right ventricular  size is normal. There is normal pulmonary artery systolic pressure. The  estimated right ventricular systolic pressure is 94.7 mmHg.   3. The mitral valve is degenerative. Mild mitral valve regurgitation. No  evidence of mitral stenosis. Moderate mitral annular calcification.   4. The aortic valve is calcified. There is moderate calcification of the  aortic  valve. There is moderate thickening of the aortic valve. Aortic  valve regurgitation is mild to moderate. Mild aortic valve stenosis.  Aortic regurgitation PHT measures 425  msec. Aortic valve area, by VTI measures 1.87 cm. Aortic valve mean  gradient measures 16.0 mmHg. Aortic valve Vmax measures 2.68 m/s.   5. The inferior vena cava is dilated in size with <50% respiratory  variability, suggesting right atrial pressure of 15 mmHg.   Comparison(s): 08/22/19 EF >65%. Side by side image comparison performed.  Compared to study of 2020, Aortic insufficiency is now mild to moderate.   EKG:  EKG normal sinus rhythm and PR interval, prominent voltage, Q-wave in lead III, overall normal appearance and when compared to 10/16/2020, no significant changes noted.  Recent Labs: No results found for requested labs within last 8760 hours.  Recent Lipid  Panel    Component Value Date/Time   CHOL 144 09/06/2017 0801   TRIG 111 09/06/2017 0801   HDL 70 09/06/2017 0801   CHOLHDL 2.1 09/06/2017 0801   CHOLHDL 1.8 08/08/2016 0810   VLDL 16 08/08/2016 0810   LDLCALC 52 09/06/2017 0801    Physical Exam:    VS:  BP 122/72   Pulse 79   Ht 5' 8.5" (1.74 m)   Wt 180 lb 12.8 oz (82 kg)   SpO2 98%   BMI 27.09 kg/m     Wt Readings from Last 3 Encounters:  10/21/21 180 lb 12.8 oz (82 kg)  10/16/20 179 lb 9.6 oz (81.5 kg)  10/17/19 182 lb 1.9 oz (82.6 kg)     GEN: Overweight. No acute distress HEENT: Normal NECK: No JVD. LYMPHATICS: No lymphadenopathy CARDIAC: 2/6 systolic and 1/6 to 2/6 diastolic right upper sternal and left mid sternal murmurs. RRR no gallop, or edema. VASCULAR:  Normal Pulses. No bruits. RESPIRATORY:  Clear to auscultation without rales, wheezing or rhonchi  ABDOMEN: Soft, non-tender, non-distended, No pulsatile mass, MUSCULOSKELETAL: No deformity  SKIN: Warm and dry NEUROLOGIC:  Alert and oriented x 3 PSYCHIATRIC:  Normal affect   ASSESSMENT:    1. Coronary artery  disease involving coronary bypass graft of native heart without angina pectoris   2. Aortic valve disease   3. Hyperlipidemia, unspecified hyperlipidemia type   4. Essential hypertension   5. Palpitations   6. Moderate aortic regurgitation    PLAN:    In order of problems listed above:  Secondary prevention discussed.  He has limitations related to musculoskeletal complaints. Combined aortic stenosis and regurgitation, in the mild to moderate range without LV dysfunction or enlargement.  Continue yearly echocardiographic surveillance. Continue statin therapy.  Most recent LDL was 50. Excellent blood pressure control on current therapy.  No change needed on olmesartan 20 mg/day. None   Overall education and awareness concerning secondary risk prevention was discussed in detail: LDL less than 70, hemoglobin A1c less than 7, blood pressure target less than 130/80 mmHg, >150 minutes of moderate aerobic activity per week, avoidance of smoking, weight control (via diet and exercise), and continued surveillance/management of/for obstructive sleep apnea.    Medication Adjustments/Labs and Tests Ordered: Current medicines are reviewed at length with the patient today.  Concerns regarding medicines are outlined above.  Orders Placed This Encounter  Procedures   EKG 12-Lead   ECHOCARDIOGRAM COMPLETE    No orders of the defined types were placed in this encounter.   There are no Patient Instructions on file for this visit.   Signed, Sinclair Grooms, MD  10/21/2021 11:23 AM    [place]

## 2021-10-21 ENCOUNTER — Encounter: Payer: Self-pay | Admitting: Interventional Cardiology

## 2021-10-21 ENCOUNTER — Other Ambulatory Visit: Payer: Self-pay

## 2021-10-21 ENCOUNTER — Ambulatory Visit (INDEPENDENT_AMBULATORY_CARE_PROVIDER_SITE_OTHER): Payer: Medicare Other | Admitting: Interventional Cardiology

## 2021-10-21 VITALS — BP 122/72 | HR 79 | Ht 68.5 in | Wt 180.8 lb

## 2021-10-21 DIAGNOSIS — I25810 Atherosclerosis of coronary artery bypass graft(s) without angina pectoris: Secondary | ICD-10-CM | POA: Diagnosis not present

## 2021-10-21 DIAGNOSIS — I351 Nonrheumatic aortic (valve) insufficiency: Secondary | ICD-10-CM

## 2021-10-21 DIAGNOSIS — I10 Essential (primary) hypertension: Secondary | ICD-10-CM | POA: Diagnosis not present

## 2021-10-21 DIAGNOSIS — E785 Hyperlipidemia, unspecified: Secondary | ICD-10-CM | POA: Diagnosis not present

## 2021-10-21 DIAGNOSIS — R002 Palpitations: Secondary | ICD-10-CM | POA: Diagnosis not present

## 2021-10-21 DIAGNOSIS — M25562 Pain in left knee: Secondary | ICD-10-CM | POA: Diagnosis not present

## 2021-10-21 DIAGNOSIS — I359 Nonrheumatic aortic valve disorder, unspecified: Secondary | ICD-10-CM | POA: Diagnosis not present

## 2021-10-21 NOTE — Patient Instructions (Signed)
Medication Instructions:  Your physician recommends that you continue on your current medications as directed. Please refer to the Current Medication list given to you today.  *If you need a refill on your cardiac medications before your next appointment, please call your pharmacy*   Lab Work: None If you have labs (blood work) drawn today and your tests are completely normal, you will receive your results only by: [facility] (if you have MyChart) OR A paper copy in the mail If you have any lab test that is abnormal or we need to change your treatment, we will call you to review the results.   Testing/Procedures: Your physician has requested that you have an echocardiogram 1-2 weeks prior to seeing Dr. Tamala Julian back in one year. Echocardiography is a painless test that uses sound waves to create images of your heart. It provides your doctor with information about the size and shape of your heart and how well your heart's chambers and valves are working. This procedure takes approximately one hour. There are no restrictions for this procedure.   Follow-Up: At [facility], you and your health needs are our priority.  As part of our continuing mission to provide you with exceptional heart care, we have created designated Provider Care Teams.  These Care Teams include your primary Cardiologist (physician) and Advanced Practice Providers (APPs -  Physician Assistants and Nurse Practitioners) who all work together to provide you with the care you need, when you need it.  We recommend signing up for the patient portal called "MyChart".  Sign up information is provided on this After Visit Summary.  MyChart is used to connect with patients for Virtual Visits (Telemedicine).  Patients are able to view lab/test results, encounter notes, upcoming appointments, etc.  Non-urgent messages can be sent to your provider as well.   To learn more about what you can do with MyChart, go to  [email or web address].    Your next appointment:   1 year(s)  The format for your next appointment:   In Person  Provider:   Sinclair Grooms, MD     Other Instructions

## 2021-10-26 DIAGNOSIS — M25562 Pain in left knee: Secondary | ICD-10-CM | POA: Diagnosis not present

## 2021-10-28 DIAGNOSIS — G5601 Carpal tunnel syndrome, right upper limb: Secondary | ICD-10-CM | POA: Diagnosis not present

## 2021-10-28 DIAGNOSIS — M24231 Disorder of ligament, right wrist: Secondary | ICD-10-CM | POA: Diagnosis not present

## 2021-10-28 DIAGNOSIS — M79644 Pain in right finger(s): Secondary | ICD-10-CM | POA: Diagnosis not present

## 2021-10-28 DIAGNOSIS — M65342 Trigger finger, left ring finger: Secondary | ICD-10-CM | POA: Diagnosis not present

## 2021-10-28 DIAGNOSIS — M65331 Trigger finger, right middle finger: Secondary | ICD-10-CM | POA: Diagnosis not present

## 2021-10-28 DIAGNOSIS — M25531 Pain in right wrist: Secondary | ICD-10-CM | POA: Diagnosis not present

## 2021-10-28 DIAGNOSIS — M25832 Other specified joint disorders, left wrist: Secondary | ICD-10-CM | POA: Diagnosis not present

## 2021-10-29 DIAGNOSIS — M25562 Pain in left knee: Secondary | ICD-10-CM | POA: Diagnosis not present

## 2021-11-03 DIAGNOSIS — M25562 Pain in left knee: Secondary | ICD-10-CM | POA: Diagnosis not present

## 2021-11-08 DIAGNOSIS — H35372 Puckering of macula, left eye: Secondary | ICD-10-CM | POA: Diagnosis not present

## 2021-11-08 DIAGNOSIS — H04123 Dry eye syndrome of bilateral lacrimal glands: Secondary | ICD-10-CM | POA: Diagnosis not present

## 2021-11-08 DIAGNOSIS — Z961 Presence of intraocular lens: Secondary | ICD-10-CM | POA: Diagnosis not present

## 2021-11-09 DIAGNOSIS — M25562 Pain in left knee: Secondary | ICD-10-CM | POA: Diagnosis not present

## 2021-11-11 DIAGNOSIS — M25562 Pain in left knee: Secondary | ICD-10-CM | POA: Diagnosis not present

## 2021-11-15 DIAGNOSIS — Z85828 Personal history of other malignant neoplasm of skin: Secondary | ICD-10-CM | POA: Diagnosis not present

## 2021-11-15 DIAGNOSIS — D1801 Hemangioma of skin and subcutaneous tissue: Secondary | ICD-10-CM | POA: Diagnosis not present

## 2021-11-15 DIAGNOSIS — L570 Actinic keratosis: Secondary | ICD-10-CM | POA: Diagnosis not present

## 2021-11-15 DIAGNOSIS — M25562 Pain in left knee: Secondary | ICD-10-CM | POA: Diagnosis not present

## 2021-11-15 DIAGNOSIS — L814 Other melanin hyperpigmentation: Secondary | ICD-10-CM | POA: Diagnosis not present

## 2021-11-15 DIAGNOSIS — L821 Other seborrheic keratosis: Secondary | ICD-10-CM | POA: Diagnosis not present

## 2021-11-18 DIAGNOSIS — M25562 Pain in left knee: Secondary | ICD-10-CM | POA: Diagnosis not present

## 2021-11-22 DIAGNOSIS — M25562 Pain in left knee: Secondary | ICD-10-CM | POA: Diagnosis not present

## 2021-11-23 DIAGNOSIS — M25562 Pain in left knee: Secondary | ICD-10-CM | POA: Diagnosis not present

## 2021-11-25 ENCOUNTER — Ambulatory Visit: Payer: Medicare Other | Attending: Internal Medicine

## 2021-11-25 ENCOUNTER — Other Ambulatory Visit (HOSPITAL_BASED_OUTPATIENT_CLINIC_OR_DEPARTMENT_OTHER): Payer: Self-pay

## 2021-11-25 DIAGNOSIS — M25562 Pain in left knee: Secondary | ICD-10-CM | POA: Diagnosis not present

## 2021-11-25 DIAGNOSIS — Z23 Encounter for immunization: Secondary | ICD-10-CM

## 2021-11-25 MED ORDER — PFIZER COVID-19 VAC BIVALENT 30 MCG/0.3ML IM SUSP
INTRAMUSCULAR | 0 refills | Status: DC
  Filled 2021-11-25: qty 0.3, 1d supply, fill #0

## 2021-11-25 NOTE — Progress Notes (Signed)
° °  Covid-19 Vaccination Clinic  Name:  Nathan [name]    MRN: [id number] DOB: May 31, 1946  11/25/2021  Mr. Tupy was observed post Covid-19 immunization for 15 minutes without incident. He was provided with Vaccine Information Sheet and instruction to access the V-Safe system.   Mr. Stegman was instructed to call 911 with any severe reactions post vaccine: Difficulty breathing  Swelling of face and throat  A fast heartbeat  A bad rash all over body  Dizziness and weakness   Immunizations Administered     Name Date Dose VIS Date Route   Pfizer Covid-19 Vaccine Bivalent Booster 11/25/2021  9:41 AM 0.3 mL 08/11/2021 Intramuscular   Manufacturer: [place]   Lot: [id number]   [place]: [phone]

## 2021-11-29 DIAGNOSIS — M25562 Pain in left knee: Secondary | ICD-10-CM | POA: Diagnosis not present

## 2021-12-09 DIAGNOSIS — M25562 Pain in left knee: Secondary | ICD-10-CM | POA: Diagnosis not present

## 2021-12-21 DIAGNOSIS — I10 Essential (primary) hypertension: Secondary | ICD-10-CM | POA: Diagnosis not present

## 2021-12-21 DIAGNOSIS — R432 Parageusia: Secondary | ICD-10-CM | POA: Diagnosis not present

## 2021-12-21 DIAGNOSIS — R42 Dizziness and giddiness: Secondary | ICD-10-CM | POA: Diagnosis not present

## 2021-12-21 DIAGNOSIS — K036 Deposits [accretions] on teeth: Secondary | ICD-10-CM | POA: Diagnosis not present

## 2021-12-22 DIAGNOSIS — M25562 Pain in left knee: Secondary | ICD-10-CM | POA: Diagnosis not present

## 2021-12-28 ENCOUNTER — Encounter: Payer: Self-pay | Admitting: Gastroenterology

## 2021-12-29 ENCOUNTER — Other Ambulatory Visit: Payer: Self-pay | Admitting: Interventional Cardiology

## 2022-01-07 ENCOUNTER — Other Ambulatory Visit: Payer: Self-pay

## 2022-01-07 DIAGNOSIS — Z86010 Personal history of colonic polyps: Secondary | ICD-10-CM | POA: Insufficient documentation

## 2022-01-07 DIAGNOSIS — M25552 Pain in left hip: Secondary | ICD-10-CM | POA: Insufficient documentation

## 2022-01-10 DIAGNOSIS — G5601 Carpal tunnel syndrome, right upper limb: Secondary | ICD-10-CM | POA: Diagnosis not present

## 2022-01-10 DIAGNOSIS — M1811 Unilateral primary osteoarthritis of first carpometacarpal joint, right hand: Secondary | ICD-10-CM | POA: Diagnosis not present

## 2022-01-10 DIAGNOSIS — M13831 Other specified arthritis, right wrist: Secondary | ICD-10-CM | POA: Diagnosis not present

## 2022-01-10 DIAGNOSIS — M720 Palmar fascial fibromatosis [Dupuytren]: Secondary | ICD-10-CM | POA: Diagnosis not present

## 2022-01-10 DIAGNOSIS — M65342 Trigger finger, left ring finger: Secondary | ICD-10-CM | POA: Diagnosis not present

## 2022-01-19 ENCOUNTER — Ambulatory Visit (INDEPENDENT_AMBULATORY_CARE_PROVIDER_SITE_OTHER): Payer: Medicare Other | Admitting: Gastroenterology

## 2022-01-19 ENCOUNTER — Encounter: Payer: Self-pay | Admitting: Gastroenterology

## 2022-01-19 VITALS — BP 110/68 | HR 82 | Ht 68.5 in | Wt 182.0 lb

## 2022-01-19 DIAGNOSIS — R196 Halitosis: Secondary | ICD-10-CM | POA: Diagnosis not present

## 2022-01-19 DIAGNOSIS — R1013 Epigastric pain: Secondary | ICD-10-CM

## 2022-01-19 NOTE — Progress Notes (Signed)
[facility] Consult Note:  History: Nathan [name] 01/19/2022  Referring provider: Ginger Organ., MD  Reason for consult/chief complaint: Bad Taste/ Teeth Staining (Patient complains of bad taste and staining of teeth, bloody, mucous spit in the morning. Patient has seen a Dentist and ENT, they offer no explanation.)   Subjective  HPI:  Nathan [name] is a very pleasant 76 year old man referred by primary care for oral symptoms with (?)  GERD or other upper digestive symptoms as a cause.  For many years he has had intermittent upper abdominal bloating after meals, and will often feel better if he eats a few pieces of fruit.  He was evaluated by Dr. Wynetta Emery in the past, and says he had an upper endoscopy with each of his last 2 colonoscopies and recalls they were both normal. For several years he has had frequent bad taste in the mouth, brown staining of his teeth and a nightguard (which he brought today), and in the morning some mucus with a light amount of blood in it.  He has had multiple visits with his dentist and an ENT physician, had what sounds like a fiberoptic exam, and was told by both providers there were no discernible cause for symptoms.  He was given a trial of pantoprazole that did not help, it only seem to cause some digestive upset.  He denies dysphagia, odynophagia, heartburn, regurgitation or water brash.  Patient has had previous GI care with Dr. Earle Gell of [facility] prior to his retirement.  Believes his last colonoscopy was 5 to 6 years ago, thinks primary care felt he should have another, no records of that available today.  ROS:  Review of Systems  Constitutional:  Negative for appetite change and unexpected weight change.  HENT:  Negative for mouth sores and voice change.   Eyes:  Negative for pain and redness.  Respiratory:  Negative for cough and shortness of breath.   Cardiovascular:  Negative for chest pain and palpitations.   Genitourinary:  Negative for dysuria and hematuria.  Musculoskeletal:  Negative for arthralgias and myalgias.  Skin:  Negative for pallor and rash.  Neurological:  Negative for weakness and headaches.  Hematological:  Negative for adenopathy.    Past Medical History: Past Medical History:  Diagnosis Date   Arthritis    Blood transfusion without reported diagnosis    Complication of anesthesia    jittery after surgery   Coronary artery disease    GERD (gastroesophageal reflux disease)    Hyperlipemia    Hypertension    Right carpal tunnel syndrome 02/08/2017   Seasonal allergies    Wears glasses      Past Surgical History: Past Surgical History:  Procedure Laterality Date   APPENDECTOMY     age 30   COLONOSCOPY     COLONOSCOPY WITH PROPOFOL N/A 02/15/2016   Procedure: COLONOSCOPY WITH PROPOFOL;  Surgeon: Garlan Fair, MD;  Location: [facility];  Service: Endoscopy;  Laterality: N/A;   CORONARY ARTERY BYPASS GRAFT     2003   ESOPHAGOGASTRODUODENOSCOPY (EGD) WITH PROPOFOL N/A 02/15/2016   Procedure: ESOPHAGOGASTRODUODENOSCOPY (EGD) WITH PROPOFOL;  Surgeon: Garlan Fair, MD;  Location: [facility];  Service: Endoscopy;  Laterality: N/A;   EXPLORATION POST OPERATIVE OPEN HEART     EYE SURGERY     detached retina-lt   INGUINAL HERNIA REPAIR Left 04/01/2014   Procedure: HERNIA REPAIR INGUINAL ADULT;  Surgeon: Shann Medal, MD;  Location: [place];  Service: General;  Laterality: Left;   INSERTION OF MESH Left 04/01/2014   Procedure: INSERTION OF MESH;  Surgeon: Shann Medal, MD;  Location: [place];  Service: General;  Laterality: Left;   KNEE ARTHROCENTESIS  1980   right   SHOULDER ACROMIOPLASTY  1985   rt   SINUS ENDO W/FUSION     TONSILLECTOMY AND ADENOIDECTOMY       Family History: Family History  Problem Relation Age of Onset   Heart disease Mother    Cancer Father    Alcohol abuse Brother    Kidney failure Brother      Social History: Social History   Socioeconomic History   Marital status: Widowed    Spouse name: Not on file   Number of children: Not on file   Years of education: post grad   Highest education level: Not on file  Occupational History   Occupation: [employer]  Tobacco Use   Smoking status: Former    Types: Cigarettes    Quit date: 03/26/1969    Years since quitting: 52.8   Smokeless tobacco: Never  Substance and Sexual Activity   Alcohol use: Yes    Alcohol/week: 0.0 standard drinks    Comment: 2 per day   Drug use: No   Sexual activity: Not Currently  Other Topics Concern   Not on file  Social History Narrative   Patient drinks 1-2 cups of caffeine daily.   Patient is left handed.    Social Determinants of Health   Financial Resource Strain: Not on file  Food Insecurity: Not on file  Transportation Needs: Not on file  Physical Activity: Not on file  Stress: Not on file  Social Connections: Not on file    Allergies: Allergies  Allergen Reactions   Bystolic [Nebivolol Hcl] Rash   Lexiscan [Regadenoson] Other (See Comments)    Sharp sternal pain   Triamcinolone Acetonide Acetate [Triamcinolone] Hives   Zetia [Ezetimibe] Diarrhea    GI effects - bloating, cramping, diarrhea on 5mg  and 10mg  dose   Amlodipine    Lipitor [Atorvastatin] Rash   Lisinopril Nausea Only    GI upset   Neosporin [Neomycin-Bacitracin Zn-Polymyx] Rash   Pravastatin Rash   Toprol Xl [Metoprolol Tartrate] Rash    Outpatient Meds: Current Outpatient Medications  Medication Sig Dispense Refill   amlodipine-olmesartan (AZOR) 10-20 MG tablet Take 1 tablet by mouth daily.     aspirin EC 81 MG tablet Take 81 mg by mouth daily.     cetirizine (ZYRTEC) 10 MG tablet Take 10 mg by mouth daily.     COVID-19 mRNA bivalent vaccine, Pfizer, (PFIZER COVID-19 VAC BIVALENT) injection Inject into the muscle. 0.3 mL 0   COVID-19 mRNA Vac-TriS, Pfizer, (PFIZER-BIONT COVID-19 VAC-TRIS) SUSP injection Inject  into the muscle. 0.3 mL 0   cycloSPORINE (RESTASIS) 0.05 % ophthalmic emulsion Place 1 drop into both eyes 2 (two) times daily.      Evolocumab (REPATHA SURECLICK) 381 MG/ML SOAJ INJECT 1 PEN UNDER THE SKIN (SUBCUTANEOUS INJECTION) EVERY 14 DAYS. 6 mL 3   flurazepam (DALMANE) 30 MG capsule Take 30 mg by mouth at bedtime as needed for sleep.     GENTEAL 0.25-0.3 % GEL Apply to eye daily.     olmesartan (BENICAR) 40 MG tablet      OLMESARTAN MEDOXOMIL PO Take 20 mg by mouth daily.     scopolamine (TRANSDERM-SCOP) 1 MG/3DAYS SMARTSIG:1 Patch(s) Topical Every 3 Days PRN     SYSTANE 0.4-0.3 %  SOLN Apply to eye as needed.     triamcinolone (NASACORT) 55 MCG/ACT AERO nasal inhaler Place 2 sprays into the nose daily.     No current facility-administered medications for this visit.      ___________________________________________________________________ Objective   Exam:  BP 110/68    Pulse 82    Ht 5' 8.5" (1.74 m)    Wt 182 lb (82.6 kg)    BMI 27.27 kg/m  Wt Readings from Last 3 Encounters:  01/19/22 182 lb (82.6 kg)  10/21/21 180 lb 12.8 oz (82 kg)  10/16/20 179 lb 9.6 oz (81.5 kg)    General: He is well-appearing with a normal vocal quality Eyes: sclera anicteric, no redness ENT: oral mucosa moist without lesions, no cervical or supraclavicular lymphadenopathy.  He is unable to open his mouth very wide, but visualized oropharynx is normal.  Posterior pharynx not well seen.  Tongue normal. CV: RRR without murmur, S1/S2, no JVD, no peripheral edema Resp: clear to auscultation bilaterally, normal RR and effort noted GI: soft, no tenderness, with active bowel sounds. No guarding or palpable organomegaly noted.    Assessment: Encounter Diagnoses  Name Primary?   Halitosis Yes   Dyspepsia     Oral symptoms, negative ENT and dental evaluation.  It seems that the question is whether reflux could be causing this, but I do not believe that to be the case.  He has no discernible reflux  symptoms, nor have I seen reflux present this way. He has some longstanding dyspeptic/upper abdominal bloating symptoms that have been stable and had previous endoscopic evaluation.  I believe an upper endoscopy at this time would be of low yield and I did not recommend it.  If previous colonoscopy and pathology reports are available and were forwarded to me, I could review them and give this patient advice on when he may be due for another colonoscopy, considering current guidelines, his age and overall good health.  Thank you for the courtesy of this consult.  Please call me with any questions or concerns.  Nelida Meuse III  CC: Referring provider noted above

## 2022-01-19 NOTE — Patient Instructions (Signed)
If you are age 78 or older, your body mass index should be between 23-30. Your Body mass index is 27.27 kg/m. If this is out of the aforementioned range listed, please consider follow up with your Primary Care Provider.  If you are age 14 or younger, your body mass index should be between 19-25. Your Body mass index is 27.27 kg/m. If this is out of the aformentioned range listed, please consider follow up with your Primary Care Provider.   ________________________________________________________  The [facility] providers would like to encourage you to use [facility] to communicate with providers for non-urgent requests or questions.  Due to long hold times on the telephone, sending your provider a message by [facility] may be a faster and more efficient way to get a response.  Please allow 48 business hours for a response.  Please remember that this is for non-urgent requests.  _______________________________________________________  It was a pleasure to see you today!  Thank you for trusting me with your gastrointestinal care!

## 2022-02-22 DIAGNOSIS — I10 Essential (primary) hypertension: Secondary | ICD-10-CM | POA: Diagnosis not present

## 2022-02-22 DIAGNOSIS — R42 Dizziness and giddiness: Secondary | ICD-10-CM | POA: Diagnosis not present

## 2022-02-22 DIAGNOSIS — K036 Deposits [accretions] on teeth: Secondary | ICD-10-CM | POA: Diagnosis not present

## 2022-02-22 DIAGNOSIS — J3489 Other specified disorders of nose and nasal sinuses: Secondary | ICD-10-CM | POA: Diagnosis not present

## 2022-02-22 DIAGNOSIS — R432 Parageusia: Secondary | ICD-10-CM | POA: Diagnosis not present

## 2022-02-28 ENCOUNTER — Other Ambulatory Visit (HOSPITAL_COMMUNITY): Payer: Self-pay | Admitting: Orthopedic Surgery

## 2022-02-28 ENCOUNTER — Ambulatory Visit (HOSPITAL_COMMUNITY)
Admission: RE | Admit: 2022-02-28 | Discharge: 2022-02-28 | Disposition: A | Discharge status: Home / Self Care | Payer: Medicare Other | Source: Ambulatory Visit | Attending: Orthopedic Surgery | Admitting: Orthopedic Surgery

## 2022-02-28 ENCOUNTER — Other Ambulatory Visit: Payer: Self-pay

## 2022-02-28 DIAGNOSIS — I82412 Acute embolism and thrombosis of left femoral vein: Secondary | ICD-10-CM | POA: Diagnosis not present

## 2022-02-28 DIAGNOSIS — R42 Dizziness and giddiness: Secondary | ICD-10-CM | POA: Diagnosis not present

## 2022-02-28 DIAGNOSIS — I10 Essential (primary) hypertension: Secondary | ICD-10-CM | POA: Diagnosis not present

## 2022-02-28 DIAGNOSIS — M79662 Pain in left lower leg: Secondary | ICD-10-CM | POA: Diagnosis not present

## 2022-02-28 DIAGNOSIS — M79605 Pain in left leg: Secondary | ICD-10-CM | POA: Diagnosis not present

## 2022-02-28 DIAGNOSIS — M25572 Pain in left ankle and joints of left foot: Secondary | ICD-10-CM | POA: Diagnosis not present

## 2022-02-28 DIAGNOSIS — M79604 Pain in right leg: Secondary | ICD-10-CM | POA: Diagnosis not present

## 2022-02-28 DIAGNOSIS — M79661 Pain in right lower leg: Secondary | ICD-10-CM | POA: Diagnosis not present

## 2022-03-11 DIAGNOSIS — I25810 Atherosclerosis of coronary artery bypass graft(s) without angina pectoris: Secondary | ICD-10-CM | POA: Diagnosis not present

## 2022-03-11 DIAGNOSIS — I700 Atherosclerosis of aorta: Secondary | ICD-10-CM | POA: Diagnosis not present

## 2022-03-11 DIAGNOSIS — I10 Essential (primary) hypertension: Secondary | ICD-10-CM | POA: Diagnosis not present

## 2022-03-11 DIAGNOSIS — E785 Hyperlipidemia, unspecified: Secondary | ICD-10-CM | POA: Diagnosis not present

## 2022-03-24 DIAGNOSIS — I82412 Acute embolism and thrombosis of left femoral vein: Secondary | ICD-10-CM | POA: Diagnosis not present

## 2022-03-24 DIAGNOSIS — T7840XA Allergy, unspecified, initial encounter: Secondary | ICD-10-CM | POA: Diagnosis not present

## 2022-03-24 DIAGNOSIS — I10 Essential (primary) hypertension: Secondary | ICD-10-CM | POA: Diagnosis not present

## 2022-03-25 DIAGNOSIS — T7840XA Allergy, unspecified, initial encounter: Secondary | ICD-10-CM | POA: Diagnosis not present

## 2022-03-25 DIAGNOSIS — I10 Essential (primary) hypertension: Secondary | ICD-10-CM | POA: Diagnosis not present

## 2022-03-25 DIAGNOSIS — I82412 Acute embolism and thrombosis of left femoral vein: Secondary | ICD-10-CM | POA: Diagnosis not present

## 2022-03-30 DIAGNOSIS — I82412 Acute embolism and thrombosis of left femoral vein: Secondary | ICD-10-CM | POA: Diagnosis not present

## 2022-03-30 DIAGNOSIS — I10 Essential (primary) hypertension: Secondary | ICD-10-CM | POA: Diagnosis not present

## 2022-03-30 DIAGNOSIS — T7840XA Allergy, unspecified, initial encounter: Secondary | ICD-10-CM | POA: Diagnosis not present

## 2022-04-01 DIAGNOSIS — B078 Other viral warts: Secondary | ICD-10-CM | POA: Diagnosis not present

## 2022-04-01 DIAGNOSIS — L309 Dermatitis, unspecified: Secondary | ICD-10-CM | POA: Diagnosis not present

## 2022-04-01 DIAGNOSIS — Z85828 Personal history of other malignant neoplasm of skin: Secondary | ICD-10-CM | POA: Diagnosis not present

## 2022-04-15 DIAGNOSIS — T7840XD Allergy, unspecified, subsequent encounter: Secondary | ICD-10-CM | POA: Diagnosis not present

## 2022-04-15 DIAGNOSIS — I82412 Acute embolism and thrombosis of left femoral vein: Secondary | ICD-10-CM | POA: Diagnosis not present

## 2022-04-15 DIAGNOSIS — I10 Essential (primary) hypertension: Secondary | ICD-10-CM | POA: Diagnosis not present

## 2022-05-16 DIAGNOSIS — M13831 Other specified arthritis, right wrist: Secondary | ICD-10-CM | POA: Diagnosis not present

## 2022-05-16 DIAGNOSIS — M65342 Trigger finger, left ring finger: Secondary | ICD-10-CM | POA: Diagnosis not present

## 2022-05-16 DIAGNOSIS — M1811 Unilateral primary osteoarthritis of first carpometacarpal joint, right hand: Secondary | ICD-10-CM | POA: Diagnosis not present

## 2022-05-16 DIAGNOSIS — G5601 Carpal tunnel syndrome, right upper limb: Secondary | ICD-10-CM | POA: Diagnosis not present

## 2022-05-17 DIAGNOSIS — D2261 Melanocytic nevi of right upper limb, including shoulder: Secondary | ICD-10-CM | POA: Diagnosis not present

## 2022-05-17 DIAGNOSIS — D225 Melanocytic nevi of trunk: Secondary | ICD-10-CM | POA: Diagnosis not present

## 2022-05-17 DIAGNOSIS — L309 Dermatitis, unspecified: Secondary | ICD-10-CM | POA: Diagnosis not present

## 2022-05-17 DIAGNOSIS — L111 Transient acantholytic dermatosis [Grover]: Secondary | ICD-10-CM | POA: Diagnosis not present

## 2022-05-17 DIAGNOSIS — Z85828 Personal history of other malignant neoplasm of skin: Secondary | ICD-10-CM | POA: Diagnosis not present

## 2022-05-17 DIAGNOSIS — L821 Other seborrheic keratosis: Secondary | ICD-10-CM | POA: Diagnosis not present

## 2022-05-17 DIAGNOSIS — D1801 Hemangioma of skin and subcutaneous tissue: Secondary | ICD-10-CM | POA: Diagnosis not present

## 2022-05-17 DIAGNOSIS — D2262 Melanocytic nevi of left upper limb, including shoulder: Secondary | ICD-10-CM | POA: Diagnosis not present

## 2022-05-23 DIAGNOSIS — R1032 Left lower quadrant pain: Secondary | ICD-10-CM | POA: Diagnosis not present

## 2022-05-23 DIAGNOSIS — K036 Deposits [accretions] on teeth: Secondary | ICD-10-CM | POA: Diagnosis not present

## 2022-05-23 DIAGNOSIS — T7840XD Allergy, unspecified, subsequent encounter: Secondary | ICD-10-CM | POA: Diagnosis not present

## 2022-05-23 DIAGNOSIS — I10 Essential (primary) hypertension: Secondary | ICD-10-CM | POA: Diagnosis not present

## 2022-05-23 DIAGNOSIS — R5383 Other fatigue: Secondary | ICD-10-CM | POA: Diagnosis not present

## 2022-05-23 DIAGNOSIS — R432 Parageusia: Secondary | ICD-10-CM | POA: Diagnosis not present

## 2022-05-23 DIAGNOSIS — I82412 Acute embolism and thrombosis of left femoral vein: Secondary | ICD-10-CM | POA: Diagnosis not present

## 2022-05-26 DIAGNOSIS — H35372 Puckering of macula, left eye: Secondary | ICD-10-CM | POA: Diagnosis not present

## 2022-05-26 DIAGNOSIS — D3131 Benign neoplasm of right choroid: Secondary | ICD-10-CM | POA: Diagnosis not present

## 2022-05-26 DIAGNOSIS — Z961 Presence of intraocular lens: Secondary | ICD-10-CM | POA: Diagnosis not present

## 2022-05-26 DIAGNOSIS — H43813 Vitreous degeneration, bilateral: Secondary | ICD-10-CM | POA: Diagnosis not present

## 2022-06-03 DIAGNOSIS — M25562 Pain in left knee: Secondary | ICD-10-CM | POA: Diagnosis not present

## 2022-06-03 DIAGNOSIS — M25561 Pain in right knee: Secondary | ICD-10-CM | POA: Diagnosis not present

## 2022-06-09 ENCOUNTER — Other Ambulatory Visit: Payer: Self-pay | Admitting: Interventional Cardiology

## 2022-06-21 DIAGNOSIS — R61 Generalized hyperhidrosis: Secondary | ICD-10-CM | POA: Diagnosis not present

## 2022-06-21 DIAGNOSIS — M25572 Pain in left ankle and joints of left foot: Secondary | ICD-10-CM | POA: Diagnosis not present

## 2022-06-21 DIAGNOSIS — I10 Essential (primary) hypertension: Secondary | ICD-10-CM | POA: Diagnosis not present

## 2022-06-21 DIAGNOSIS — R5383 Other fatigue: Secondary | ICD-10-CM | POA: Diagnosis not present

## 2022-06-21 DIAGNOSIS — I82412 Acute embolism and thrombosis of left femoral vein: Secondary | ICD-10-CM | POA: Diagnosis not present

## 2022-06-30 ENCOUNTER — Other Ambulatory Visit: Payer: Self-pay | Admitting: Internal Medicine

## 2022-06-30 ENCOUNTER — Ambulatory Visit
Admission: RE | Admit: 2022-06-30 | Discharge: 2022-06-30 | Disposition: A | Discharge status: Home / Self Care | Payer: Medicare Other | Source: Ambulatory Visit | Attending: Internal Medicine | Admitting: Internal Medicine

## 2022-06-30 DIAGNOSIS — M4328 Fusion of spine, sacral and sacrococcygeal region: Secondary | ICD-10-CM | POA: Diagnosis not present

## 2022-06-30 DIAGNOSIS — M25572 Pain in left ankle and joints of left foot: Secondary | ICD-10-CM | POA: Diagnosis not present

## 2022-06-30 DIAGNOSIS — R1032 Left lower quadrant pain: Secondary | ICD-10-CM

## 2022-06-30 DIAGNOSIS — G4700 Insomnia, unspecified: Secondary | ICD-10-CM | POA: Diagnosis not present

## 2022-06-30 DIAGNOSIS — R61 Generalized hyperhidrosis: Secondary | ICD-10-CM | POA: Diagnosis not present

## 2022-06-30 DIAGNOSIS — K5730 Diverticulosis of large intestine without perforation or abscess without bleeding: Secondary | ICD-10-CM | POA: Diagnosis not present

## 2022-06-30 DIAGNOSIS — R197 Diarrhea, unspecified: Secondary | ICD-10-CM | POA: Diagnosis not present

## 2022-06-30 DIAGNOSIS — I82412 Acute embolism and thrombosis of left femoral vein: Secondary | ICD-10-CM | POA: Diagnosis not present

## 2022-06-30 DIAGNOSIS — M4316 Spondylolisthesis, lumbar region: Secondary | ICD-10-CM | POA: Diagnosis not present

## 2022-06-30 DIAGNOSIS — R42 Dizziness and giddiness: Secondary | ICD-10-CM | POA: Diagnosis not present

## 2022-06-30 DIAGNOSIS — D6869 Other thrombophilia: Secondary | ICD-10-CM | POA: Diagnosis not present

## 2022-06-30 DIAGNOSIS — K314 Gastric diverticulum: Secondary | ICD-10-CM | POA: Diagnosis not present

## 2022-06-30 DIAGNOSIS — R351 Nocturia: Secondary | ICD-10-CM | POA: Diagnosis not present

## 2022-06-30 DIAGNOSIS — R7989 Other specified abnormal findings of blood chemistry: Secondary | ICD-10-CM | POA: Diagnosis not present

## 2022-06-30 MED ORDER — IOPAMIDOL (ISOVUE-300) INJECTION 61%
100.0000 mL | Freq: Once | INTRAVENOUS | Status: AC | PRN
Start: 2022-06-30 — End: 2022-06-30
  Administered 2022-06-30: 100 mL via INTRAVENOUS

## 2022-07-18 DIAGNOSIS — G4700 Insomnia, unspecified: Secondary | ICD-10-CM | POA: Diagnosis not present

## 2022-07-18 DIAGNOSIS — R42 Dizziness and giddiness: Secondary | ICD-10-CM | POA: Diagnosis not present

## 2022-07-18 DIAGNOSIS — D6869 Other thrombophilia: Secondary | ICD-10-CM | POA: Diagnosis not present

## 2022-07-18 DIAGNOSIS — K5792 Diverticulitis of intestine, part unspecified, without perforation or abscess without bleeding: Secondary | ICD-10-CM | POA: Diagnosis not present

## 2022-07-18 DIAGNOSIS — I82412 Acute embolism and thrombosis of left femoral vein: Secondary | ICD-10-CM | POA: Diagnosis not present

## 2022-07-18 DIAGNOSIS — R61 Generalized hyperhidrosis: Secondary | ICD-10-CM | POA: Diagnosis not present

## 2022-07-18 DIAGNOSIS — M25572 Pain in left ankle and joints of left foot: Secondary | ICD-10-CM | POA: Diagnosis not present

## 2022-07-18 DIAGNOSIS — R7989 Other specified abnormal findings of blood chemistry: Secondary | ICD-10-CM | POA: Diagnosis not present

## 2022-07-25 ENCOUNTER — Other Ambulatory Visit (HOSPITAL_COMMUNITY): Payer: Self-pay | Admitting: Internal Medicine

## 2022-07-25 DIAGNOSIS — R600 Localized edema: Secondary | ICD-10-CM

## 2022-08-01 ENCOUNTER — Ambulatory Visit (HOSPITAL_COMMUNITY)
Admission: RE | Admit: 2022-08-01 | Discharge: 2022-08-01 | Disposition: A | Discharge status: Home / Self Care | Payer: Medicare Other | Source: Ambulatory Visit | Attending: Surgery | Admitting: Surgery

## 2022-08-01 DIAGNOSIS — R600 Localized edema: Secondary | ICD-10-CM

## 2022-08-04 ENCOUNTER — Ambulatory Visit (INDEPENDENT_AMBULATORY_CARE_PROVIDER_SITE_OTHER): Payer: Medicare Other | Admitting: Podiatry

## 2022-08-04 ENCOUNTER — Other Ambulatory Visit: Payer: Self-pay | Admitting: Podiatry

## 2022-08-04 DIAGNOSIS — M779 Enthesopathy, unspecified: Secondary | ICD-10-CM | POA: Diagnosis not present

## 2022-08-04 MED ORDER — UREA 45 % EX CREA: 1.0000 | TOPICAL_CREAM | Freq: Every day | CUTANEOUS | 1 refills | Status: DC

## 2022-08-04 NOTE — Progress Notes (Signed)
Subjective: Chief Complaint  Patient presents with   Foot Pain    Patient came in today for dry skin and cracks in the heel and side of the feet, which started 6 months ago, Patient denies any pain, TX: OTC Lamisil for 2 weeks     76 year old male presents the office with above complaints.  He states that he has noticed some dry, cracked skin at first he tried using "tough skin" however that did not work so he did 2 weeks of Lamisil think it was athlete's foot.  It has not opened up or any bleeding or drainage that he reports.  Also he gets calluses at the tip of his second toes and his toenails are thick.  Has tried antifungal medication with significant improvement.  Lastly he has concerns of pain to the anterior aspect of the ankle he points on the anterior lateral aspect.  He states he started developing ankle pain and he went to see orthopedics for the swelling found to have DVT.  He states about once a week or so he develops pain mostly at nighttime to his ankle.  Describes almost as a cramping sensation.  Objective: AAO x3, NAD DP/PT pulses palpable bilaterally, CRT less than 3 seconds Plantar aspect of the feet is dry, callused skin with superficial skin fissures without any skin breakdown or ulceration.  Hyperkeratotic tissue present at the distal portion of second toes with the toes are elongated rub inside shoes.  Second digit nails are hypertrophic, dystrophic.  Subjectively describing discomfort in the anterior lateral aspect the ankle.  Unable to elicit any significant pain today.  No area pinpoint tenderness.  Ankle range of motion intact. 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  -Debrided hyperkeratotic tissue with any complications or bleeding.   Recommended urea cream for the dry skin.  He can also use this on the second digit toenails with the dystrophy as well as the callus.  Offloading -In regards to the ankle pain we will order diagnostic ultrasound to further evaluate.  Previous x-ray performed by orthopedics. -Patient encouraged to call the office with any questions, concerns, change in symptoms.   Trula Slade DPM

## 2022-08-06 ENCOUNTER — Other Ambulatory Visit: Payer: Self-pay | Admitting: Podiatry

## 2022-08-06 MED ORDER — AMMONIUM LACTATE 12 % EX LOTN: 1.0000 | TOPICAL_LOTION | CUTANEOUS | 0 refills | Status: DC | PRN

## 2022-08-06 NOTE — Telephone Encounter (Signed)
Sent lac-hydrin 12% lotion

## 2022-08-11 DIAGNOSIS — R7989 Other specified abnormal findings of blood chemistry: Secondary | ICD-10-CM | POA: Diagnosis not present

## 2022-08-11 DIAGNOSIS — I10 Essential (primary) hypertension: Secondary | ICD-10-CM | POA: Diagnosis not present

## 2022-08-11 DIAGNOSIS — R5383 Other fatigue: Secondary | ICD-10-CM | POA: Diagnosis not present

## 2022-08-11 DIAGNOSIS — E785 Hyperlipidemia, unspecified: Secondary | ICD-10-CM | POA: Diagnosis not present

## 2022-08-11 DIAGNOSIS — E291 Testicular hypofunction: Secondary | ICD-10-CM | POA: Diagnosis not present

## 2022-08-11 DIAGNOSIS — Z125 Encounter for screening for malignant neoplasm of prostate: Secondary | ICD-10-CM | POA: Diagnosis not present

## 2022-08-11 DIAGNOSIS — R7301 Impaired fasting glucose: Secondary | ICD-10-CM | POA: Diagnosis not present

## 2022-08-18 ENCOUNTER — Other Ambulatory Visit: Payer: Self-pay | Admitting: Internal Medicine

## 2022-08-18 DIAGNOSIS — R197 Diarrhea, unspecified: Secondary | ICD-10-CM | POA: Diagnosis not present

## 2022-08-18 DIAGNOSIS — M25572 Pain in left ankle and joints of left foot: Secondary | ICD-10-CM | POA: Diagnosis not present

## 2022-08-18 DIAGNOSIS — R42 Dizziness and giddiness: Secondary | ICD-10-CM | POA: Diagnosis not present

## 2022-08-18 DIAGNOSIS — I700 Atherosclerosis of aorta: Secondary | ICD-10-CM | POA: Diagnosis not present

## 2022-08-18 DIAGNOSIS — E785 Hyperlipidemia, unspecified: Secondary | ICD-10-CM | POA: Diagnosis not present

## 2022-08-18 DIAGNOSIS — Z86718 Personal history of other venous thrombosis and embolism: Secondary | ICD-10-CM | POA: Diagnosis not present

## 2022-08-18 DIAGNOSIS — Z1331 Encounter for screening for depression: Secondary | ICD-10-CM | POA: Diagnosis not present

## 2022-08-18 DIAGNOSIS — I10 Essential (primary) hypertension: Secondary | ICD-10-CM | POA: Diagnosis not present

## 2022-08-18 DIAGNOSIS — R82998 Other abnormal findings in urine: Secondary | ICD-10-CM | POA: Diagnosis not present

## 2022-08-18 DIAGNOSIS — M25552 Pain in left hip: Secondary | ICD-10-CM | POA: Diagnosis not present

## 2022-08-18 DIAGNOSIS — Z1389 Encounter for screening for other disorder: Secondary | ICD-10-CM | POA: Diagnosis not present

## 2022-08-18 DIAGNOSIS — K5792 Diverticulitis of intestine, part unspecified, without perforation or abscess without bleeding: Secondary | ICD-10-CM | POA: Diagnosis not present

## 2022-08-18 DIAGNOSIS — M48061 Spinal stenosis, lumbar region without neurogenic claudication: Secondary | ICD-10-CM | POA: Diagnosis not present

## 2022-08-18 DIAGNOSIS — I25810 Atherosclerosis of coronary artery bypass graft(s) without angina pectoris: Secondary | ICD-10-CM | POA: Diagnosis not present

## 2022-08-18 DIAGNOSIS — Z0000 Encounter for general adult medical examination without abnormal findings: Secondary | ICD-10-CM | POA: Diagnosis not present

## 2022-08-18 NOTE — Telephone Encounter (Signed)
Left a vm for the patient to return my call.

## 2022-08-22 ENCOUNTER — Ambulatory Visit
Admission: RE | Admit: 2022-08-22 | Discharge: 2022-08-22 | Disposition: A | Discharge status: Home / Self Care | Payer: Medicare Other | Source: Ambulatory Visit | Attending: Podiatry | Admitting: Podiatry

## 2022-08-22 ENCOUNTER — Ambulatory Visit
Admission: RE | Admit: 2022-08-22 | Discharge: 2022-08-22 | Disposition: A | Discharge status: Home / Self Care | Payer: Medicare Other | Source: Ambulatory Visit | Attending: Internal Medicine | Admitting: Internal Medicine

## 2022-08-22 DIAGNOSIS — M779 Enthesopathy, unspecified: Secondary | ICD-10-CM

## 2022-08-22 DIAGNOSIS — M25572 Pain in left ankle and joints of left foot: Secondary | ICD-10-CM | POA: Diagnosis not present

## 2022-08-22 DIAGNOSIS — M25571 Pain in right ankle and joints of right foot: Secondary | ICD-10-CM | POA: Diagnosis not present

## 2022-08-22 DIAGNOSIS — K529 Noninfective gastroenteritis and colitis, unspecified: Secondary | ICD-10-CM | POA: Diagnosis not present

## 2022-08-22 DIAGNOSIS — N281 Cyst of kidney, acquired: Secondary | ICD-10-CM | POA: Diagnosis not present

## 2022-08-22 DIAGNOSIS — K5792 Diverticulitis of intestine, part unspecified, without perforation or abscess without bleeding: Secondary | ICD-10-CM

## 2022-08-22 DIAGNOSIS — N3289 Other specified disorders of bladder: Secondary | ICD-10-CM | POA: Diagnosis not present

## 2022-08-22 DIAGNOSIS — K5732 Diverticulitis of large intestine without perforation or abscess without bleeding: Secondary | ICD-10-CM | POA: Diagnosis not present

## 2022-08-22 MED ORDER — IOPAMIDOL (ISOVUE-300) INJECTION 61%
100.0000 mL | Freq: Once | INTRAVENOUS | Status: AC | PRN
  Administered 2022-08-22: 100 mL via INTRAVENOUS

## 2022-08-30 ENCOUNTER — Telehealth: Payer: Self-pay

## 2022-08-30 NOTE — Telephone Encounter (Signed)
-----   Message from Doran Stabler, MD sent at 08/30/2022  9:16 AM EDT ----- Regarding: referral Nathan [name],   Please see chart I forwarded to you on this patient - referral for diverticulitis. I can see him at the 120appt on Oct 3rd.  Thx  - HD

## 2022-08-30 NOTE — Telephone Encounter (Signed)
Patient has been scheduled for a follow up with Dr. Loletha Carrow on Tuesday, 09/13/22 at 1:20 pm. Pt to arrive by 1 pm.  Left patient a detailed vm with appt information. Pt was advised to call back if appt date or tome does not work for him.  Appt information mailed and sent to patient via MyChart as well.

## 2022-09-06 DIAGNOSIS — R197 Diarrhea, unspecified: Secondary | ICD-10-CM | POA: Diagnosis not present

## 2022-09-06 DIAGNOSIS — K5792 Diverticulitis of intestine, part unspecified, without perforation or abscess without bleeding: Secondary | ICD-10-CM | POA: Diagnosis not present

## 2022-09-06 DIAGNOSIS — R42 Dizziness and giddiness: Secondary | ICD-10-CM | POA: Diagnosis not present

## 2022-09-06 DIAGNOSIS — I10 Essential (primary) hypertension: Secondary | ICD-10-CM | POA: Diagnosis not present

## 2022-09-08 ENCOUNTER — Other Ambulatory Visit (HOSPITAL_BASED_OUTPATIENT_CLINIC_OR_DEPARTMENT_OTHER): Payer: Self-pay

## 2022-09-08 MED ORDER — INFLUENZA VAC A&B SA ADJ QUAD 0.5 ML IM PRSY
PREFILLED_SYRINGE | INTRAMUSCULAR | 0 refills | Status: DC
  Filled 2022-09-08: qty 0.5, 1d supply, fill #0

## 2022-09-13 ENCOUNTER — Telehealth: Payer: Self-pay | Admitting: Pharmacist

## 2022-09-13 ENCOUNTER — Other Ambulatory Visit: Payer: Medicare Other

## 2022-09-13 ENCOUNTER — Encounter: Payer: Self-pay | Admitting: Gastroenterology

## 2022-09-13 ENCOUNTER — Ambulatory Visit (INDEPENDENT_AMBULATORY_CARE_PROVIDER_SITE_OTHER): Payer: Medicare Other | Admitting: Gastroenterology

## 2022-09-13 VITALS — BP 142/64 | HR 93 | Ht 68.0 in | Wt 181.5 lb

## 2022-09-13 DIAGNOSIS — R194 Change in bowel habit: Secondary | ICD-10-CM | POA: Diagnosis not present

## 2022-09-13 DIAGNOSIS — R933 Abnormal findings on diagnostic imaging of other parts of digestive tract: Secondary | ICD-10-CM | POA: Diagnosis not present

## 2022-09-13 DIAGNOSIS — R1032 Left lower quadrant pain: Secondary | ICD-10-CM

## 2022-09-13 MED ORDER — DICYCLOMINE HCL 10 MG PO CAPS: 10.0000 mg | ORAL_CAPSULE | Freq: Two times a day (BID) | ORAL | 1 refills | Status: DC | PRN

## 2022-09-13 MED ORDER — PLENVU 140 G PO SOLR: 140.0000 g | ORAL | 0 refills | Status: DC

## 2022-09-13 NOTE — Progress Notes (Signed)
[facility] Progress Note  Chief Complaint: Abdominal pain and concern for diverticulitis  Subjective  History:  Nathan [name] was referred back to see me by his PCP Dr. Brigitte Pulse with concern for persistent diverticulitis.  Nathan [name] has had abdominal pain with imaging suggesting thickening in the sigmoid and possible diverticulitis, he is apparently not felt better after several rounds of antibiotics. Nathan [name] tells me that his pain started over a year ago, because at 1 point he wondered if it may have been a post-COVID phenomenon, then thought more decided he had started before he contracted COVID in August 2022.  Pain was there all the time in the left groin and toward the hip and apparently it was thought probably to be musculoskeletal.  He had various treatments for that including medicines and physical therapy.  Previous note also details how he had a left lower extremity DVT for which she was on Xarelto, and then a follow-up ultrasound in August showed it resolved so his [place] was stopped. Pain is persistent in the left lower quadrant, did not improve after course of antibiotics in July.  He then had a second course of antibiotics with Cipro and Flagyl and says that it "tore me up" with worsened abdominal pain, development of loose stool that has persisted, change in taste and appetite. This pain that he points to in the left lower quadrant and also in the left groin is there all the time, it was worse with sleep when he was trying to lay on the left side but recently better in that regard.  He still has intermittent loose stool and no rectal bleeding. cardiovascular:  no chest pain Respiratory: no dyspnea Halitosis Remainder systems negative except as above The patient's Past Medical, Family and Social History were reviewed and are on file in the EMR. 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    Blood transfusion without  reported diagnosis    Complication of anesthesia    jittery after surgery   Coronary artery disease    GERD (gastroesophageal reflux disease)    History of DVT (deep vein thrombosis)    Hyperlipemia    Hypertension    IFG (impaired fasting glucose)    Right carpal tunnel syndrome 02/08/2017   Seasonal allergies    Wears glasses     Objective:  Med list reviewed  Current Outpatient Medications:    amlodipine-olmesartan (AZOR) 10-20 MG tablet, Take 1 tablet by mouth daily., Disp: , Rfl:    ammonium lactate (AMLACTIN) 12 % lotion, Apply 1 Application topically as needed for dry skin., Disp: 400 g, Rfl: 0   cetirizine (ZYRTEC) 10 MG tablet, Take 10 mg by mouth daily., Disp: , Rfl:    cycloSPORINE (RESTASIS) 0.05 % ophthalmic emulsion, Place 1 drop into both eyes 2 (two) times daily. , Disp: , Rfl:    Evolocumab (REPATHA SURECLICK) 081 MG/ML SOAJ, INJECT 1 PEN UNDER THE SKIN (SUBCUTANEOUS INJECTION) EVERY 14 DAYS., Disp: 2 mL, Rfl: 11   flurazepam (DALMANE) 30 MG capsule, Take 30 mg by mouth at bedtime as needed for sleep., Disp: , Rfl:    GENTEAL 0.25-0.3 % GEL, Apply to eye daily., Disp: , Rfl:    scopolamine (TRANSDERM-SCOP) 1 MG/3DAYS, SMARTSIG:1 Patch(s) Topical Every 3 Days PRN, Disp: , Rfl:    SYSTANE 0.4-0.3 % SOLN, Apply to eye as needed., Disp: , Rfl:    triamcinolone (NASACORT) 55 MCG/ACT AERO  nasal inhaler, Place 2 sprays into the nose daily., Disp: , Rfl:    ASPIRIN LOW DOSE 81 MG tablet, Take 81 mg by mouth daily., Disp: , Rfl:    COVID-19 mRNA Vac-TriS, Pfizer, (PFIZER-BIONT COVID-19 VAC-TRIS) SUSP injection, Inject into the muscle. (Patient not taking: Reported on 09/13/2022), Disp: 0.3 mL, Rfl: 0   influenza vaccine adjuvanted (FLUAD) 0.5 ML injection, Inject into the muscle. (Patient not taking: Reported on 09/13/2022), Disp: 0.5 mL, Rfl: 0   olmesartan (BENICAR) 40 MG tablet, , Disp: , Rfl:    rivaroxaban (XARELTO) 20 MG TABS tablet, 1 tablet with food Orally Once a day  for 30 day(s), Disp: , Rfl:    Urea 45 % CREA, Apply 1 Application topically daily., Disp: 225 g, Rfl: 1  Multiple med allergies and intolerances listed in chart.  He says that Eliquis was initially prescribed for the DVT and it caused severe allergic skin reaction.  When he was changed to Xarelto he noticed the change in bowel habits. Vital signs in last 24 hrs: Vitals:   09/13/22 1317  BP: (!) 142/64  Pulse: 93   Wt Readings from Last 3 Encounters:  09/13/22 181 lb 8 oz (82.3 kg)  01/19/22 182 lb (82.6 kg)  10/21/21 180 lb 12.8 oz (82 kg)    Physical Exam  Well-appearing HEENT: sclera anicteric, oral mucosa moist without lesions Neck: supple, no thyromegaly, JVD or lymphadenopathy Cardiac: Regular without murmur,  no peripheral edema Pulm: clear to auscultation bilaterally, normal RR and effort noted Abdomen: soft, LLQ tenderness (somewhat distractible), with active bowel sounds. No guarding or palpable hepatosplenomegaly. Skin; warm and dry, no jaundice or rash  Labs:  Normal CBC at PCP office  ___________________________________________ Radiologic studies:  CLINICAL DATA:  Acute diverticulitis, still has pain after 4 rounds of antibiotics and nausea   EXAM: CT ABDOMEN AND PELVIS WITH CONTRAST   TECHNIQUE: Multidetector CT imaging of the abdomen and pelvis was performed using the standard protocol following bolus administration of intravenous contrast.   RADIATION DOSE REDUCTION: This exam was performed according to the departmental dose-optimization program which includes automated exposure control, adjustment of the mA and/or kV according to patient size and/or use of iterative reconstruction technique.   CONTRAST:  138m ISOVUE-300 IOPAMIDOL (ISOVUE-300) INJECTION 61%   COMPARISON:  CT 06/30/2022.   FINDINGS: Lower chest: No acute abnormality.  Probable prior CABG.   Hepatobiliary: No focal liver abnormality is seen. The gallbladder is unremarkable.    Pancreas: Unremarkable. No pancreatic ductal dilatation or surrounding inflammatory changes.   Spleen: Normal size with small adjacent splenules.   Adrenals/Urinary Tract: Adrenal glands are unremarkable. Small bilateral renal cysts, too small to characterize. These are unchanged from prior exam. No follow-up imaging is recommended. The bladder is mildly distended.   Stomach/Bowel: The stomach is within normal limits. There is no evidence of bowel obstruction.Prior appendectomy. Colonic diverticulosis, most prominent involving the sigmoid colon. There is mild focal fat stranding anteriorly at the junction of the descending and sigmoid colon (series 2 images 70-72, in a similar location to the inflammation seen on prior CT in July. There is a tiny diverticula in this region but also in ovoid fat containing lesion anteriorly (series 8, image 133). No fluid collection. No free gas.   Vascular/Lymphatic: Aortoiliac atherosclerosis. No AAA. No lymphadenopathy.   Reproductive: Mildly enlarged.   Other: Bilateral varicoceles. Bilateral hydroceles. No bowel containing hernia. No ascites. No free air.   Musculoskeletal: Multilevel degenerative changes of the spine. No  acute osseous abnormality. 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 Unchanged partial ankylosis of the SI joints.   IMPRESSION: Mild stranding anteriorly at the junction of the descending and sigmoid colon, potentially residual from prior inflammatory process in July which was in the same location. Tiny diverticula in this region but also an ovoid fat containing lesion. The stranding could be sequela of diverticulitis or potentially epiploic appendagitis. No focal fluid collection or free gas.     Electronically Signed   By: Maurine Simmering M.D.   On: 08/22/2022 12:31 ____________________________________________________  CLINICAL DATA:  Left lower quadrant pain for 1 year. Tender to the touch for 6 weeks.    EXAM: CT ABDOMEN AND PELVIS WITH CONTRAST   TECHNIQUE: Multidetector CT imaging of the abdomen and pelvis was performed using the standard protocol following bolus administration of intravenous contrast.   RADIATION DOSE REDUCTION: This exam was performed according to the departmental dose-optimization program which includes automated exposure control, adjustment of the mA and/or kV according to patient size and/or use of iterative reconstruction technique.   CONTRAST:  119m ISOVUE-300 IOPAMIDOL (ISOVUE-300) INJECTION 61%   COMPARISON:  07/13/2017   FINDINGS: Lower chest: No acute abnormality. Multi vessel coronary artery atherosclerosis. Mitral annular calcification. Prior CABG.   Hepatobiliary: No focal liver abnormality is seen. No gallstones, gallbladder wall thickening, or biliary dilatation.   Pancreas: Unremarkable. No pancreatic ductal dilatation or surrounding inflammatory changes.   Spleen: Normal in size without focal abnormality.   Adrenals/Urinary Tract: Adrenal glands are unremarkable. Kidneys are normal, without renal calculi, focal lesion, or hydronephrosis. Bladder is unremarkable.   Stomach/Bowel: Stomach is within normal limits. No evidence of bowel wall thickening, distention, or inflammatory changes. Diverticulosis of the sigmoid colon with mild peridiverticular inflammatory changes most concerning for mild diverticulitis.   Vascular/Lymphatic: No significant vascular findings are present. No enlarged abdominal or pelvic lymph nodes.   Reproductive: Prostate is unremarkable.   Other: No abdominal wall hernia or abnormality. No abdominopelvic ascites.   Musculoskeletal: No acute osseous abnormality. No aggressive osseous lesion. Partial ankylosis of bilateral SI joints. Grade 1 anterolisthesis of L4 on L5. Minimal retrolisthesis of L1 on L2. Degenerative disease with disc height loss at L1-2 and L2-3. Bilateral facet arthropathy throughout the  lumbar spine.   IMPRESSION: 1. Diverticulosis of the sigmoid colon with mild peridiverticular inflammatory changes most concerning for mild acute diverticulitis.     Electronically Signed   By: HKathreen DevoidM.D.   On: 06/30/2022 13:10    (CT images personally reviewed -- H. Danis, MD)  ____________________________________________ Other:   _____________________________________________ Assessment & Plan  Assessment: Encounter Diagnoses  Name Primary?   LLQ abdominal pain Yes   Change in bowel habits    Abnormal finding on GI tract imaging    It is difficult to say for sure what is going on here.  It is not clear to me this has truly been diverticulitis.  Not withstanding a CT scan can underrepresent the degree of true diverticulitis related inflammation, he had no improvement on antibiotics and the reported radiographic findings are mild and nonspecific as well as out of proportion to his reported symptoms and exam findings. Recent development of intermittent diarrhea, may have happened due to multiple rounds of antibiotics and perhaps development of C. difficile.  He feels it may be a side effect of the Xarelto.  Plan: Stool for C. difficile testing  Colonoscopy.  He was agreeable after discussion of procedure and risks.  The benefits and risks of  the planned procedure were described in detail with the patient or (when appropriate) their health care proxy.  Risks were outlined as including, but not limited to, bleeding, infection, perforation, adverse medication reaction leading to cardiac or pulmonary decompensation, pancreatitis (if ERCP).  The limitation of incomplete mucosal visualization was also discussed.  No guarantees or warranties were given.  Trial of as needed dicyclomine given.   Nelida Meuse III

## 2022-09-13 NOTE — Telephone Encounter (Signed)
Patient called stating he is having issues with his BP. He had a DVT several months ago. Had a reaction to Eliquis. A few months ago he started having issues with his stomach. He was put on different abx which made him sick. Nausea/diarrhea.  PCP took him off all medications bc BP was low But then BP went back up. He was  resumed on olmesartan '20mg'$  (1/2 of previous dose) 1 week ago. On Satuday he experienced several low blood pressures. (55'M systolic) Now having highs (150-160's) and moderate readings (080'E-233K systolic). He is very concerned about the fluctuations from high BP to lower BPs.   This AM his BP was 145/80. Nausea and diarrhea resumed today. 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 He cant eat as much. He is trying to stay hydrated but also states he cant stand peeing so many times. He only have 1 day of lows. I think his his GI issues its going to be difficult to get his BP perfect like he wants. I asked him to continue olmesartan '20mg'$  daily until next week and then call and follow up with me early next week. He has many intolerances to medications.

## 2022-09-19 ENCOUNTER — Other Ambulatory Visit: Payer: Medicare Other

## 2022-09-19 DIAGNOSIS — R933 Abnormal findings on diagnostic imaging of other parts of digestive tract: Secondary | ICD-10-CM

## 2022-09-19 DIAGNOSIS — R194 Change in bowel habit: Secondary | ICD-10-CM

## 2022-09-19 DIAGNOSIS — R1032 Left lower quadrant pain: Secondary | ICD-10-CM | POA: Diagnosis not present

## 2022-09-21 LAB — CLOSTRIDIUM DIFFICILE TOXIN B, QUALITATIVE, REAL-TIME PCR: Toxigenic C. Difficile by PCR: DETECTED — AB

## 2022-09-22 ENCOUNTER — Telehealth: Payer: Self-pay | Admitting: Pharmacist

## 2022-09-22 ENCOUNTER — Telehealth: Payer: Self-pay | Admitting: Gastroenterology

## 2022-09-22 LAB — C. DIFFICILE GDH AND TOXIN A/B
GDH ANTIGEN: DETECTED
MICRO NUMBER:: 14025677
SPECIMEN QUALITY:: ADEQUATE
TOXIN A AND B: NOT DETECTED

## 2022-09-22 LAB — CLOSTRIDIUM DIFFICILE TOXIN B, QUALITATIVE, REAL-TIME PCR

## 2022-09-22 MED ORDER — VANCOMYCIN HCL 125 MG PO CAPS: 125.0000 mg | ORAL_CAPSULE | Freq: Four times a day (QID) | ORAL | 0 refills | Status: AC

## 2022-09-22 NOTE — Telephone Encounter (Signed)
Patient inquring about his next steps since finding out he has c-diff. Please advise

## 2022-09-22 NOTE — Telephone Encounter (Signed)
Patient called for follow up on his BP. Was diagnosed with Ciff today. Colonoscopy has been delayed now until Nov. Patient is nervous. Lives alone. Had another episode of low BP this afternoon 94/68. This AM BP was 482 systolic. Feels fine now after laying down. Reports drinking and eating today. BM is down to 2 per day. Felt nauseous today. He has had about 4 other low BP since he talked to me on the 3rd. I have asked him to hold his olmesartan for the time being. More concerned about his low BP (possible due to his hydration status? ) and I am about short her higher BP. Patient will hold for the next 2 weeks while he hopefully starts to recover as they treat his cdiff. Sounds like he also has an underlying issues that they need to do a colonoscopy to figure out, cdiff is probably just a result of the abx use. He will call me in 2 weeks to follow up.

## 2022-09-22 NOTE — Telephone Encounter (Signed)
Called and spoke with patient regarding his stool study results and recommendations. Pt would like RX sent to [facility] on file. We rescheduled pt's colonoscopy to Wednesday, 11/09/22 at 9 am. Pt is aware that he will arrive at 8:00 am with a care partner. Pt states that he will adjust the date on his current instructions. Pt reported that his BP has been running low, he does take antihypertensive medications. I recommended that patient reach out to his PCP as they may need to adjust his medications. Pt states that he will call PCP today. I reviewed C. Diff precautions with patient, he is aware that this is contagious. He lives alone, he does have a housekeeper that comes by weekly. Pt has been advised to clean any high touched surfaces with bleach and use proper hand hygiene. He verbalized understanding of all information.  Monchell, will you please initiate PA for Vancomycin ASAP? Thanks

## 2022-09-22 NOTE — Telephone Encounter (Signed)
His C diff explains the recent diarrhea but not clearly the abdominal pain for over a year and CT findings.  So he needs treatment for C diff as below, but the colonoscopy also needs to be moved back to mid to late November to allow completion of the treatment and healing of any colitis that may be present from the infection.  Vancomycin 125 mg One tablet four times daily for 10 days Disp#40 tablets, RF zero  - H. Danis

## 2022-09-23 ENCOUNTER — Other Ambulatory Visit (HOSPITAL_COMMUNITY): Payer: Self-pay

## 2022-09-23 NOTE — Telephone Encounter (Signed)
Noted, thanks!

## 2022-10-07 NOTE — Telephone Encounter (Signed)
Patient called to report that he is doing ok BP 169/100 this AM Has been more in the 150/70 - hasnt checked much per my request. Only taking Repatha, ASA, zyrtec Finished vancomycin and feeling a little better Still having multiple bowel movements but he is feeling like he has more energy. Went for a >80mle walk today, had 15,000 steps yesterday, isnt falling asleep in his chair. I advised for him to resume olmesartan at '20mg'$  daily. He will see Dr. STamala Julianon 11/6. He can call me if needed. Can increase to '40mg'$  olmesartan if needed.

## 2022-10-10 ENCOUNTER — Encounter: Payer: Medicare Other | Admitting: Gastroenterology

## 2022-10-12 ENCOUNTER — Ambulatory Visit (HOSPITAL_COMMUNITY): Payer: Medicare Other | Attending: Interventional Cardiology

## 2022-10-12 DIAGNOSIS — I359 Nonrheumatic aortic valve disorder, unspecified: Secondary | ICD-10-CM | POA: Diagnosis not present

## 2022-10-12 DIAGNOSIS — I25810 Atherosclerosis of coronary artery bypass graft(s) without angina pectoris: Secondary | ICD-10-CM | POA: Insufficient documentation

## 2022-10-12 LAB — ECHOCARDIOGRAM COMPLETE
AR max vel: 1.15 cm2
AV Area VTI: 0.97 cm2
AV Area mean vel: 1.13 cm2
AV Mean grad: 19.1 mmHg
AV Peak grad: 31.2 mmHg
Ao pk vel: 2.79 m/s
Area-P 1/2: 2.95 cm2
P 1/2 time: 368 msec
S' Lateral: 2.7 cm

## 2022-10-15 NOTE — Progress Notes (Unsigned)
Cardiology Office Note:    Date:  10/17/2022   ID:  Nathan [name], DOB 04-09-1946, MRN [id number]  PCP:  Ginger Organ., MD  Cardiologist:  Sinclair Grooms, MD   Referring MD: Ginger Organ., MD   Chief Complaint  Patient presents with   Cardiac Valve Problem    Moderate aortic stenosis   Coronary Artery Disease   Hypertension   Advice Only    ATTR?    History of Present Illness:    Nathan [name] is a 76 y.o. male with a hx of CABG 2003, hypertension, hyperlipidemia, and family history of CAD. Now having increasing difficulty with medication intolerances including beta blockers, statins, and regadenoson.  For the past 2 weeks, he awakens from sleep with fluttering and palpitations in his chest.  Over a year ago he had multiple days where there were episodes of palpitation but we never identify the etiology.  He denies angina, orthopnea, and PND.  Recent echo demonstrates a has has progressed to moderate.  Over the past year he has suffered through left lower extremity DVT that developed after a trip/long flight from [place].  He developed hives on Eliquis and was ultimately treated with Xarelto.  He developed C. difficile and is being managed by Dr. Loletha Carrow.  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    Blood transfusion without reported diagnosis    Complication of anesthesia    jittery after surgery   Coronary artery disease    GERD (gastroesophageal reflux disease)    History of DVT (deep vein thrombosis)    Hyperlipemia    Hypertension    IFG (impaired fasting glucose)    Right carpal tunnel syndrome 02/08/2017   Seasonal allergies    Wears glasses     Past Surgical History:  Procedure Laterality Date   APPENDECTOMY     age 1   COLONOSCOPY     COLONOSCOPY WITH PROPOFOL N/A 02/15/2016   Procedure: COLONOSCOPY WITH PROPOFOL;  Surgeon: Garlan Fair, MD;   Location: [facility];  Service: Endoscopy;  Laterality: N/A;   CORONARY ARTERY BYPASS GRAFT     2003   ESOPHAGOGASTRODUODENOSCOPY (EGD) WITH PROPOFOL N/A 02/15/2016   Procedure: ESOPHAGOGASTRODUODENOSCOPY (EGD) WITH PROPOFOL;  Surgeon: Garlan Fair, MD;  Location: [facility];  Service: Endoscopy;  Laterality: N/A;   EXPLORATION POST OPERATIVE OPEN HEART     EYE SURGERY     detached retina-lt   INGUINAL HERNIA REPAIR Left 04/01/2014   Procedure: HERNIA REPAIR INGUINAL ADULT;  Surgeon: Shann Medal, MD;  Location: [place];  Service: General;  Laterality: Left;   INSERTION OF MESH Left 04/01/2014   Procedure: INSERTION OF MESH;  Surgeon: Shann Medal, MD;  Location: [place];  Service: General;  Laterality: Left;   KNEE ARTHROCENTESIS  1980   right   SHOULDER ACROMIOPLASTY  1985   rt   SINUS ENDO W/FUSION     TONSILLECTOMY AND ADENOIDECTOMY      Current Medications: Current Meds  Medication Sig   amlodipine-olmesartan (AZOR) 10-20 MG tablet Take 1 tablet by mouth daily.   ammonium lactate (AMLACTIN) 12 % lotion Apply 1 Application topically as needed for dry skin.   ASPIRIN LOW DOSE 81 MG tablet Take 81 mg by mouth daily.   cetirizine (ZYRTEC) 10 MG tablet Take 10 mg by mouth daily.  COVID-19 mRNA Vac-TriS, Pfizer, (PFIZER-BIONT COVID-19 VAC-TRIS) SUSP injection Inject into the muscle.   cycloSPORINE (RESTASIS) 0.05 % ophthalmic emulsion Place 1 drop into both eyes 2 (two) times daily.    Evolocumab (REPATHA SURECLICK) 656 MG/ML SOAJ INJECT 1 PEN UNDER THE SKIN (SUBCUTANEOUS INJECTION) EVERY 14 DAYS.   GENTEAL 0.25-0.3 % GEL Apply to eye daily.   influenza vaccine adjuvanted (FLUAD) 0.5 ML injection Inject into the muscle.   olmesartan (BENICAR) 20 MG tablet Take 20 mg by mouth daily.   PEG-KCl-NaCl-NaSulf-Na Asc-C (PLENVU) 140 g SOLR Take 140 g by mouth as directed.   scopolamine (TRANSDERM-SCOP) 1 MG/3DAYS SMARTSIG:1 Patch(s) Topical Every  3 Days PRN   SYSTANE 0.4-0.3 % SOLN Apply to eye as needed.   triamcinolone (NASACORT) 55 MCG/ACT AERO nasal inhaler Place 2 sprays into the nose daily.     Allergies:   Bystolic [nebivolol hcl], Lexiscan [regadenoson], Triamcinolone acetonide acetate [triamcinolone], Zetia [ezetimibe], Amlodipine, Apixaban, Lipitor [atorvastatin], Lisinopril, Neosporin [neomycin-bacitracin zn-polymyx], Pravastatin, and Toprol xl [metoprolol tartrate]   Social History   Socioeconomic History   Marital status: Widowed    Spouse name: Not on file   Number of children: Not on file   Years of education: post grad   Highest education level: Not on file  Occupational History   Occupation: [employer]  Tobacco Use   Smoking status: Former    Types: Cigarettes    Quit date: 03/26/1969    Years since quitting: 53.5   Smokeless tobacco: Never  Substance and Sexual Activity   Alcohol use: Yes    Alcohol/week: 0.0 standard drinks of alcohol    Comment: 2 per day   Drug use: No   Sexual activity: Not Currently  Other Topics Concern   Not on file  Social History Narrative   Patient drinks 1-2 cups of caffeine daily.   Patient is left handed.    Social Determinants of Health   Financial Resource Strain: Not on file  Food Insecurity: Not on file  Transportation Needs: Not on file  Physical Activity: Not on file  Stress: Not on file  Social Connections: Not on file     Family History: The patient's family history includes Alcohol abuse in his brother; Cancer in his father; Heart disease in his mother; Kidney failure in his brother.  ROS:   Please see the history of present illness.    He has done research on his condition.  He feels he has a lot of symptoms at could represent ATTR.  All other systems reviewed and are negative.  EKGs/Labs/Other Studies Reviewed:    The following studies were reviewed today:  November 2020 three 2D Doppler echocardiogram: IMPRESSIONS   1. Left ventricular ejection  fraction, by estimation, is 60 to 65%. The  left ventricle has normal function. The left ventricle has no regional  wall motion abnormalities. There is mild concentric left ventricular  hypertrophy. Left ventricular diastolic  parameters are consistent with Grade I diastolic dysfunction (impaired  relaxation).   2. Right ventricular systolic function is normal. The right ventricular  size is normal. There is normal pulmonary artery systolic pressure. The  estimated right ventricular systolic pressure is 81.2 mmHg.   3. The mitral valve is degenerative. Mild mitral valve regurgitation. No  evidence of mitral stenosis.   4. The aortic valve is tricuspid. There is severe calcifcation of the  aortic valve. There is severe thickening of the aortic valve. Aortic valve  regurgitation is mild. Moderate aortic valve stenosis. Aortic  valve area,  by VTI measures 0.97 cm. Aortic  valve mean gradient measures 19.1 mmHg. Aortic valve Vmax measures 2.79  m/s.   5. The inferior vena cava is normal in size with greater than 50%  respiratory variability, suggesting right atrial pressure of 3 mmHg.   Comparison(s): Changes from prior study are noted. AS is now moderate.    EKG:  EKG normal sinus rhythm, 69 bpm, otherwise normal.  Recent Labs: No results found for requested labs within last 365 days.  Recent Lipid Panel    Component Value Date/Time   CHOL 144 09/06/2017 0801   TRIG 111 09/06/2017 0801   HDL 70 09/06/2017 0801   CHOLHDL 2.1 09/06/2017 0801   CHOLHDL 1.8 08/08/2016 0810   VLDL 16 08/08/2016 0810   LDLCALC 52 09/06/2017 0801    Physical Exam:    VS:  BP (!) 140/64   Pulse 69   Ht '5\' 8"'$  (1.727 m)   Wt 182 lb 6.4 oz (82.7 kg)   SpO2 97%   BMI 27.73 kg/m     Wt Readings from Last 3 Encounters:  10/17/22 182 lb 6.4 oz (82.7 kg)  09/13/22 181 lb 8 oz (82.3 kg)  01/19/22 182 lb (82.6 kg)     GEN: Weight is well managed. No acute distress HEENT: Normal NECK: No  JVD. LYMPHATICS: No lymphadenopathy CARDIAC: 2 over 6-3 of 6 right upper sternal and left mid sternal systolic murmur without diastolic murmur. RRR no gallop, or edema. VASCULAR:  Normal Pulses. No bruits. RESPIRATORY:  Clear to auscultation without rales, wheezing or rhonchi  ABDOMEN: Soft, non-tender, non-distended, No pulsatile mass, MUSCULOSKELETAL: No deformity  SKIN: Warm and dry NEUROLOGIC:  Alert and oriented x 3 PSYCHIATRIC:  Normal affect   ASSESSMENT:    1. Coronary artery disease involving coronary bypass graft of native heart without angina pectoris   2. Moderate aortic regurgitation   3. Hyperlipidemia, unspecified hyperlipidemia type   4. Essential hypertension   5. Palpitations    PLAN:    In order of problems listed above:  Secondary prevention discussed. Moderate aortic stenosis by echo with less aortic regurgitation and previously noted office visit with Dr. Glenford Bayley. Continue Repatha. Continue olmesartan. 2-week monitor to rule out atrial fibrillation. Interval development of DVT requiring 5 months of Xarelto.  He developed an allergy to Eliquis.  Not currently on anticoagulation therapy at this time.  Overall education and awareness concerning primary/secondary risk prevention was discussed in detail: LDL less than 70, hemoglobin A1c less than 7, blood pressure target less than 130/80 mmHg, >150 minutes of moderate aerobic activity per week, avoidance of smoking, weight control (via diet and exercise), and continued surveillance/management of/for obstructive sleep apnea.  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  He has increased voltage on his EKG which goes against infiltration.  Echo does reveal moderate LVH.  Unlikely that this is a concern but mentioning this because it concerned the patient.  Medication Adjustments/Labs and Tests Ordered: Current medicines are reviewed at length with the patient today.  Concerns regarding medicines  are outlined above.  Orders Placed This Encounter  Procedures   LONG TERM MONITOR (3-14 DAYS)   EKG 12-Lead   ECHOCARDIOGRAM COMPLETE   No orders of the defined types were placed in this encounter.   Patient Instructions  Medication Instructions:  Your physician recommends that you continue on your current medications as directed. Please refer to the Current Medication list given to you today.  *  If you need a refill on your cardiac medications before your next appointment, please call your pharmacy*  Lab Work: NONE  Testing/Procedures: Your physician has requested that you have an echocardiogram early October 2024. Echocardiography is a painless test that uses sound waves to create images of your heart. It provides your doctor with information about the size and shape of your heart and how well your heart's chambers and valves are working. This procedure takes approximately one hour. There are no restrictions for this procedure. Please do NOT wear cologne, perfume, aftershave, or lotions (deodorant is allowed). Please arrive 15 minutes prior to your appointment time.  Your physician has requested that you wear a Zio heart monitor for 14 days. This will be mailed to your home with instructions on how to apply the monitor and how to return it when finished. Please allow 2 weeks after returning the heart monitor before our office calls you with the results.   Follow-Up: At [facility], you and your health needs are our priority.  As part of our continuing mission to provide you with exceptional heart care, we have created designated Provider Care Teams.  These Care Teams include your primary Cardiologist (physician) and Advanced Practice Providers (APPs -  Physician Assistants and Nurse Practitioners) who all work together to provide you with the care you need, when you need it.  Your next appointment:   1 year(s)  The format for your next appointment:   In Person  Provider:    Rudean Haskell, MD  Other Instructions [place] Monitor Instructions     Your physician has requested you wear a ZIO patch monitor for 14 days.  This is a single patch monitor. [employer] supplies one patch monitor per enrollment. Additional  stickers are not available. Please do not apply patch if you will be having a Nuclear Stress Test,  Echocardiogram, Cardiac CT, MRI, or Chest Xray during the period you would be wearing the  monitor. The patch cannot be worn during these tests. You cannot remove and re-apply the  ZIO XT patch monitor.  Your ZIO patch monitor will be mailed 3 day USPS to your address on file. It may take 3-5 days  to receive your monitor after you have been enrolled.  Once you have received your monitor, please review the enclosed instructions. Your monitor  has already been registered assigning a specific monitor serial # to you.     Billing and Patient Assistance Program Information     We have supplied [employer] with any of your insurance information on file for billing purposes.  [employer] offers a sliding scale Patient Assistance Program for patients that do not have  insurance, or whose insurance does not completely cover the cost of the ZIO monitor.  You must apply for the Patient Assistance Program to qualify for this discounted rate.  To apply, please call [employer] at [phone], select option 4, select option 2, ask to apply for  Patient Assistance Program. Theodore Demark will ask your household income, and how many people  are in your household. They will quote your out-of-pocket cost based on that information.  [employer] will also be able to set up a 11-month interest-free payment plan if needed.     Applying the monitor     Shave hair from upper left chest.  Hold abrader disc by orange tab. Rub abrader in 40 strokes over the upper left chest as  indicated in your monitor instructions.  Clean area with 4  enclosed alcohol pads. Let dry.  Apply  patch as indicated in monitor instructions. Patch will be placed under collarbone on left  side of chest with arrow pointing upward.  Rub patch adhesive wings for 2 minutes. Remove white label marked "1". Remove the white  label marked "2". Rub patch adhesive wings for 2 additional minutes.  While looking in a mirror, press and release button in center of patch. A small green light will  flash 3-4 times. This will be your only indicator that the monitor has been turned on.  Do not shower for the first 24 hours. You may shower after the first 24 hours.  Press the button if you feel a symptom. You will hear a small click. Record Date, Time and  Symptom in the Patient Logbook.  When you are ready to remove the patch, follow instructions on the last 2 pages of Patient  Logbook. Stick patch monitor onto the last page of Patient Logbook.  Place Patient Logbook in the blue and white box. Use locking tab on box and tape box closed  securely. The blue and white box has prepaid postage on it. Please place it in the mailbox as  soon as possible. Your physician should have your test results approximately 7 days after the  monitor has been mailed back to [facility].  Call [place] at [phone] if you have questions regarding  your ZIO XT patch monitor. Call them immediately if you see an orange light blinking on your  monitor.  If your monitor falls off in less than 4 days, contact our Monitor department at [phone].  If your monitor becomes loose or falls off after 4 days call [employer] at [phone] for  suggestions on securing your monitor.    Important Information About Sugar         Signed, Sinclair Grooms, MD  10/17/2022 12:36 PM    [facility]

## 2022-10-17 ENCOUNTER — Ambulatory Visit (INDEPENDENT_AMBULATORY_CARE_PROVIDER_SITE_OTHER): Payer: Medicare Other

## 2022-10-17 ENCOUNTER — Encounter: Payer: Self-pay | Admitting: Interventional Cardiology

## 2022-10-17 ENCOUNTER — Ambulatory Visit: Payer: Medicare Other | Attending: Interventional Cardiology | Admitting: Interventional Cardiology

## 2022-10-17 VITALS — BP 140/64 | HR 69 | Ht 68.0 in | Wt 182.4 lb

## 2022-10-17 DIAGNOSIS — E785 Hyperlipidemia, unspecified: Secondary | ICD-10-CM | POA: Diagnosis not present

## 2022-10-17 DIAGNOSIS — I351 Nonrheumatic aortic (valve) insufficiency: Secondary | ICD-10-CM | POA: Insufficient documentation

## 2022-10-17 DIAGNOSIS — R002 Palpitations: Secondary | ICD-10-CM

## 2022-10-17 DIAGNOSIS — I10 Essential (primary) hypertension: Secondary | ICD-10-CM | POA: Insufficient documentation

## 2022-10-17 DIAGNOSIS — I25810 Atherosclerosis of coronary artery bypass graft(s) without angina pectoris: Secondary | ICD-10-CM | POA: Diagnosis not present

## 2022-10-17 NOTE — Patient Instructions (Addendum)
Medication Instructions:  Your physician recommends that you continue on your current medications as directed. Please refer to the Current Medication list given to you today.  *If you need a refill on your cardiac medications before your next appointment, please call your pharmacy*  Lab Work: NONE  Testing/Procedures: Your physician has requested that you have an echocardiogram early October 2024. Echocardiography is a painless test that uses sound waves to create images of your heart. It provides your doctor with information about the size and shape of your heart and how well your heart's chambers and valves are working. This procedure takes approximately one hour. There are no restrictions for this procedure. Please do NOT wear cologne, perfume, aftershave, or lotions (deodorant is allowed). Please arrive 15 minutes prior to your appointment time.  Your physician has requested that you wear a Zio heart monitor for 14 days. This will be mailed to your home with instructions on how to apply the monitor and how to return it when finished. Please allow 2 weeks after returning the heart monitor before our office calls you with the results.   Follow-Up: At [facility], you and your health needs are our priority.  As part of our continuing mission to provide you with exceptional heart care, we have created designated Provider Care Teams.  These Care Teams include your primary Cardiologist (physician) and Advanced Practice Providers (APPs -  Physician Assistants and Nurse Practitioners) who all work together to provide you with the care you need, when you need it.  Your next appointment:   1 year(s)  The format for your next appointment:   In Person  Provider:   Rudean Haskell, MD  Other Instructions [place] Monitor Instructions     Your physician has requested you wear a ZIO patch monitor for 14 days.  This is a single patch monitor. [employer] supplies one patch  monitor per enrollment. Additional  stickers are not available. Please do not apply patch if you will be having a Nuclear Stress Test,  Echocardiogram, Cardiac CT, MRI, or Chest Xray during the period you would be wearing the  monitor. The patch cannot be worn during these tests. You cannot remove and re-apply the  ZIO XT patch monitor.  Your ZIO patch monitor will be mailed 3 day USPS to your address on file. It may take 3-5 days  to receive your monitor after you have been enrolled.  Once you have received your monitor, please review the enclosed instructions. Your monitor  has already been registered assigning a specific monitor serial # to you.     Billing and Patient Assistance Program Information     We have supplied [employer] with any of your insurance information on file for billing purposes.  [employer] offers a sliding scale Patient Assistance Program for patients that do not have  insurance, or whose insurance does not completely cover the cost of the ZIO monitor.  You must apply for the Patient Assistance Program to qualify for this discounted rate.  To apply, please call [employer] at [phone], select option 4, select option 2, ask to apply for  Patient Assistance Program. Theodore Demark will ask your household income, and how many people  are in your household. They will quote your out-of-pocket cost based on that information.  [employer] will also be able to set up a 40-month interest-free payment plan if needed.     Applying the monitor     Shave hair from upper left chest.  Hold  abrader disc by orange tab. Rub abrader in 40 strokes over the upper left chest as  indicated in your monitor instructions.  Clean area with 4 enclosed alcohol pads. Let dry.  Apply patch as indicated in monitor instructions. Patch will be placed under collarbone on left  side of chest with arrow pointing upward.  Rub patch adhesive wings for 2 minutes. Remove white label marked "1". Remove the white  label  marked "2". Rub patch adhesive wings for 2 additional minutes.  While looking in a mirror, press and release button in center of patch. A small green light will  flash 3-4 times. This will be your only indicator that the monitor has been turned on.  Do not shower for the first 24 hours. You may shower after the first 24 hours.  Press the button if you feel a symptom. You will hear a small click. Record Date, Time and  Symptom in the Patient Logbook.  When you are ready to remove the patch, follow instructions on the last 2 pages of Patient  Logbook. Stick patch monitor onto the last page of Patient Logbook.  Place Patient Logbook in the blue and white box. Use locking tab on box and tape box closed  securely. The blue and white box has prepaid postage on it. Please place it in the mailbox as  soon as possible. Your physician should have your test results approximately 7 days after the  monitor has been mailed back to [employer].  Call [place] at [phone] if you have questions regarding  your ZIO XT patch monitor. Call them immediately if you see an orange light blinking on your  monitor.  If your monitor falls off in less than 4 days, contact our Monitor department at [phone].  If your monitor becomes loose or falls off after 4 days call [employer] at [phone] for  suggestions on securing your monitor.    Important Information About Sugar

## 2022-10-17 NOTE — Progress Notes (Unsigned)
Enrolled for [employer] to mail a ZIO XT long term holter monitor to the patients address on file.  

## 2022-10-19 ENCOUNTER — Other Ambulatory Visit (HOSPITAL_COMMUNITY): Payer: Medicare Other

## 2022-10-21 DIAGNOSIS — R002 Palpitations: Secondary | ICD-10-CM | POA: Diagnosis not present

## 2022-10-26 DIAGNOSIS — H35372 Puckering of macula, left eye: Secondary | ICD-10-CM | POA: Diagnosis not present

## 2022-10-26 DIAGNOSIS — Z961 Presence of intraocular lens: Secondary | ICD-10-CM | POA: Diagnosis not present

## 2022-10-26 DIAGNOSIS — H04123 Dry eye syndrome of bilateral lacrimal glands: Secondary | ICD-10-CM | POA: Diagnosis not present

## 2022-10-28 ENCOUNTER — Telehealth: Payer: Self-pay | Admitting: Interventional Cardiology

## 2022-10-28 ENCOUNTER — Ambulatory Visit (HOSPITAL_COMMUNITY)
Admission: RE | Admit: 2022-10-28 | Discharge: 2022-10-28 | Disposition: A | Discharge status: Home / Self Care | Payer: Medicare Other | Source: Ambulatory Visit | Attending: Family Medicine | Admitting: Family Medicine

## 2022-10-28 ENCOUNTER — Other Ambulatory Visit (HOSPITAL_COMMUNITY): Payer: Self-pay | Admitting: Family Medicine

## 2022-10-28 DIAGNOSIS — M7989 Other specified soft tissue disorders: Secondary | ICD-10-CM | POA: Diagnosis not present

## 2022-10-28 DIAGNOSIS — K580 Irritable bowel syndrome with diarrhea: Secondary | ICD-10-CM | POA: Diagnosis not present

## 2022-10-28 DIAGNOSIS — R002 Palpitations: Secondary | ICD-10-CM | POA: Diagnosis not present

## 2022-10-28 DIAGNOSIS — M79605 Pain in left leg: Secondary | ICD-10-CM | POA: Diagnosis not present

## 2022-10-28 DIAGNOSIS — Z86718 Personal history of other venous thrombosis and embolism: Secondary | ICD-10-CM | POA: Diagnosis not present

## 2022-10-28 DIAGNOSIS — I351 Nonrheumatic aortic (valve) insufficiency: Secondary | ICD-10-CM | POA: Diagnosis not present

## 2022-10-28 DIAGNOSIS — M25552 Pain in left hip: Secondary | ICD-10-CM | POA: Diagnosis not present

## 2022-10-28 DIAGNOSIS — I10 Essential (primary) hypertension: Secondary | ICD-10-CM | POA: Diagnosis not present

## 2022-10-28 DIAGNOSIS — I82452 Acute embolism and thrombosis of left peroneal vein: Secondary | ICD-10-CM | POA: Diagnosis not present

## 2022-10-28 DIAGNOSIS — R591 Generalized enlarged lymph nodes: Secondary | ICD-10-CM | POA: Diagnosis not present

## 2022-10-28 DIAGNOSIS — I25810 Atherosclerosis of coronary artery bypass graft(s) without angina pectoris: Secondary | ICD-10-CM | POA: Diagnosis not present

## 2022-10-28 DIAGNOSIS — I700 Atherosclerosis of aorta: Secondary | ICD-10-CM | POA: Diagnosis not present

## 2022-10-28 DIAGNOSIS — R55 Syncope and collapse: Secondary | ICD-10-CM | POA: Diagnosis not present

## 2022-10-28 NOTE — Telephone Encounter (Signed)
Patient just received an heart monitor last week but passed out last night at a restaurant. Please advise

## 2022-10-28 NOTE — Telephone Encounter (Signed)
Patient called in to report his had a syncopal episode on 10/27/2022 and lost conciseness for a brief amount of time. Prior to passing out he became diaphoretic and experienced nausea and vomiting.  He was in a restaurant and states a physician assessed him and advised he go to his PCP today. Patient was seen [facility] today and advised to call cardiology.   Patient is wearing a 14 day ZIO monitor and is on day 7.   Scheduled patient to be seen on 11/01/22 in our office. Advised patient if he experiences any more episodes, palpitations, SOB, diaphoresis to call EMS or go the ED for evaluation. Patient verbalized understanding.

## 2022-10-30 NOTE — Progress Notes (Unsigned)
Cardiology Office Note:    Date:  11/01/2022   ID:  Nathan [name], DOB 14-Feb-1946, MRN [id number]  PCP:  Ginger Organ., MD   [facility] Providers Cardiologist:  Sinclair Grooms, MD     Referring MD: Ginger Organ., MD   Chief Complaint: syncope  History of Present Illness:    Nathan [name] is a pleasant 76 y.o. male with a hx of CAD s/p CABG 2003, HTN, HLD,DVT, and moderate AS.   Referred back to cardiology by PCP for evaluation of murmur and seen by Dr. Tamala Julian on 10/17/2019. History of chest burning that improved on proton pump inhibitor.  Seen in clinic on 10/17/2022 by Dr. Tamala Julian. He reported for the past 2 weeks he awakened from sleep with fluttering and palpitations in his chest.  He had multiple episodes over a year ago and we could never identify the etiology. Developed DVT LLE after long flight from [place].  Developed hives on Eliquis and ultimately treated with Xarelto for 5 months then stopped.  2D echo 10/12/22 revealed LVEF 60 to 65%, no RWMA, mild LVH, G1 DD, normal RV, mild MR, moderate aortic valve stenosis with mean gradient 19.1 mmHg and AVA 0.97 cm2. Zio monitor ordered for 14 days. Advised to return in 1 year with echo prior (will transfer from Dr. Tamala Julian to Dr. Gasper Sells).   Venous ultrasound 10/28/2022 positive for left DVT. CTA 10/31/22 negative for acute PE.   Today, he is here alone. Was walking at [facility] on 11/15, where he usually walks 2 1/2 miles. Was feeling well until he developed severe LLE pain from the groin down to the ankle. He thought he could walk it out but it never got better. The following day, was going out to dinner and took a hydromorphone for pain prior to leaving. After eating dinner, he became very diaphoretic then next thing he was aware of was being surrounded by a doctor and a nurse. He had slumped forward, passed out and vomited.  He received reassurance from the medical providers at the scene and also talked with  PCP the evening who advised him to follow-up as an outpatient.  He continued to vomit but had no additional symptoms.  No presyncope, syncope since that time. Is currently wearing a Zio monitor for palpitations that always occurr when waking up in the mornings. Symptoms do not last very long and are not associated with chest pain, shortness of breath, or diaphoresis. Due to the size of the DVT, felt it developed over the past year. Recalls that his legs were swollen one year ago when in [place].   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    Blood transfusion without reported diagnosis    Complication of anesthesia    jittery after surgery   Coronary artery disease    GERD (gastroesophageal reflux disease)    History of DVT (deep vein thrombosis)    Hyperlipemia    Hypertension    IFG (impaired fasting glucose)    Right carpal tunnel syndrome 02/08/2017   Seasonal allergies    Wears glasses     Past Surgical History:  Procedure Laterality Date   APPENDECTOMY     age 68   COLONOSCOPY     COLONOSCOPY WITH PROPOFOL N/A 02/15/2016   Procedure: COLONOSCOPY WITH PROPOFOL;  Surgeon: Garlan Fair, MD;  Location: [facility];  Service:  Endoscopy;  Laterality: N/A;   CORONARY ARTERY BYPASS GRAFT     2003   ESOPHAGOGASTRODUODENOSCOPY (EGD) WITH PROPOFOL N/A 02/15/2016   Procedure: ESOPHAGOGASTRODUODENOSCOPY (EGD) WITH PROPOFOL;  Surgeon: Garlan Fair, MD;  Location: [facility];  Service: Endoscopy;  Laterality: N/A;   EXPLORATION POST OPERATIVE OPEN HEART     EYE SURGERY     detached retina-lt   INGUINAL HERNIA REPAIR Left 04/01/2014   Procedure: HERNIA REPAIR INGUINAL ADULT;  Surgeon: Shann Medal, MD;  Location: [place];  Service: General;  Laterality: Left;   INSERTION OF MESH Left 04/01/2014   Procedure: INSERTION OF MESH;  Surgeon: Shann Medal, MD;  Location: [place] [facility];  Service: General;  Laterality: Left;   KNEE ARTHROCENTESIS  1980   right   SHOULDER ACROMIOPLASTY  1985   rt   SINUS ENDO W/FUSION     TONSILLECTOMY AND ADENOIDECTOMY      Current Medications: Current Meds  Medication Sig   ammonium lactate (AMLACTIN) 12 % lotion Apply 1 Application topically as needed for dry skin.   ASPIRIN LOW DOSE 81 MG tablet Take 81 mg by mouth daily.   cetirizine (ZYRTEC) 10 MG tablet Take 10 mg by mouth daily.   cycloSPORINE (RESTASIS) 0.05 % ophthalmic emulsion Place 1 drop into both eyes 2 (two) times daily.    Evolocumab (REPATHA SURECLICK) 622 MG/ML SOAJ INJECT 1 PEN UNDER THE SKIN (SUBCUTANEOUS INJECTION) EVERY 14 DAYS.   GENTEAL 0.25-0.3 % GEL Apply to eye daily.   HYDROcodone-acetaminophen (NORCO/VICODIN) 5-325 MG tablet every 6 (six) hours as needed for moderate pain or severe pain.   olmesartan (BENICAR) 20 MG tablet Take 20 mg by mouth daily.   Rivaroxaban (XARELTO) 15 MG TABS tablet Take 15 mg by mouth 2 (two) times daily with a meal. Pt takes for '15mg'$  twice per day for 21 days, then '20mg'$  daily   scopolamine (TRANSDERM-SCOP) 1 MG/3DAYS SMARTSIG:1 Patch(s) Topical Every 3 Days PRN   SYSTANE 0.4-0.3 % SOLN Apply to eye as needed.   triamcinolone (NASACORT) 55 MCG/ACT AERO nasal inhaler Place 2 sprays into the nose daily.   [DISCONTINUED] amlodipine-olmesartan (AZOR) 10-20 MG tablet Take 1 tablet by mouth daily.   [DISCONTINUED] COVID-19 mRNA Vac-TriS, Pfizer, (PFIZER-BIONT COVID-19 VAC-TRIS) SUSP injection Inject into the muscle.   [DISCONTINUED] influenza vaccine adjuvanted (FLUAD) 0.5 ML injection Inject into the muscle.   [DISCONTINUED] PEG-KCl-NaCl-NaSulf-Na Asc-C (PLENVU) 140 g SOLR Take 140 g by mouth as directed.     Allergies:   Bystolic [nebivolol hcl], Lexiscan [regadenoson], Triamcinolone acetonide acetate [triamcinolone], Zetia [ezetimibe], Amlodipine, Hydromorphone, Apixaban, Lipitor [atorvastatin], Lisinopril, Neosporin  [neomycin-bacitracin zn-polymyx], Pravastatin, and Toprol xl [metoprolol tartrate]   Social History   Socioeconomic History   Marital status: Widowed    Spouse name: Not on file   Number of children: Not on file   Years of education: post grad   Highest education level: Not on file  Occupational History   Occupation: [employer]  Tobacco Use   Smoking status: Former    Types: Cigarettes    Quit date: 03/26/1969    Years since quitting: 53.6   Smokeless tobacco: Never  Substance and Sexual Activity   Alcohol use: Yes    Alcohol/week: 0.0 standard drinks of alcohol    Comment: 2 per day   Drug use: No   Sexual activity: Not Currently  Other Topics Concern   Not on file  Social History Narrative   Patient drinks 1-2 cups  of caffeine daily.   Patient is left handed.    Social Determinants of Health   Financial Resource Strain: Not on file  Food Insecurity: Not on file  Transportation Needs: Not on file  Physical Activity: Not on file  Stress: Not on file  Social Connections: Not on file     Family History: The patient's family history includes Alcohol abuse in his brother; Cancer in his father; Heart disease in his mother; Kidney failure in his brother.  ROS:   Please see the history of present illness.    + LLE pain All other systems reviewed and are negative.  Labs/Other Studies Reviewed:    The following studies were reviewed today:  DVT 10/28/22  RIGHT:  - No evidence of common femoral vein obstruction.    LEFT:  - Findings consistent with acute deep vein thrombosis involving the left  peroneal veins.  - No cystic structure found in the popliteal fossa.  - Ultrasound characteristics of enlarged lymph nodes noted in the groin.     *See table(s) above for measurements and observations.   Echo 10/12/22  1. Left ventricular ejection fraction, by estimation, is 60 to 65%. The  left ventricle has normal function. The left ventricle has no regional  wall motion  abnormalities. There is mild concentric left ventricular  hypertrophy. Left ventricular diastolic  parameters are consistent with Grade I diastolic dysfunction (impaired  relaxation).   2. Right ventricular systolic function is normal. The right ventricular  size is normal. There is normal pulmonary artery systolic pressure. The  estimated right ventricular systolic pressure is 01.6 mmHg.   3. The mitral valve is degenerative. Mild mitral valve regurgitation. No  evidence of mitral stenosis.   4. The aortic valve is tricuspid. There is severe calcifcation of the  aortic valve. There is severe thickening of the aortic valve. Aortic valve  regurgitation is mild. Moderate aortic valve stenosis. Aortic valve area,  by VTI measures 0.97 cm. Aortic  valve mean gradient measures 19.1 mmHg. Aortic valve Vmax measures 2.79  m/s.   5. The inferior vena cava is normal in size with greater than 50%  respiratory variability, suggesting right atrial pressure of 3 mmHg.   Comparison(s): Changes from prior study are noted. AS is now moderate.    Recent Labs: No results found for requested labs within last 365 days.  Recent Lipid Panel    Component Value Date/Time   CHOL 144 09/06/2017 0801   TRIG 111 09/06/2017 0801   HDL 70 09/06/2017 0801   CHOLHDL 2.1 09/06/2017 0801   CHOLHDL 1.8 08/08/2016 0810   VLDL 16 08/08/2016 0810   LDLCALC 52 09/06/2017 0801     Risk Assessment/Calculations:           Physical Exam:    VS:  BP 118/64   Pulse 78   Ht '5\' 8"'$  (1.727 m)   Wt 182 lb (82.6 kg)   SpO2 97%   BMI 27.67 kg/m     Wt Readings from Last 3 Encounters:  11/01/22 182 lb (82.6 kg)  10/17/22 182 lb 6.4 oz (82.7 kg)  09/13/22 181 lb 8 oz (82.3 kg)     GEN:  Well nourished, well developed in no acute distress HEENT: Normal NECK: No JVD; No carotid bruits CARDIAC: RRR. 3/6 systolic murmur bilateral upper sternal borders. No rubs, gallops RESPIRATORY:  Clear to auscultation without  rales, wheezing or rhonchi  ABDOMEN: Soft, non-tender, non-distended MUSCULOSKELETAL:  No edema; No deformity. 2+ pedal  pulses, equal bilaterally SKIN: Warm and dry NEUROLOGIC:  Alert and oriented x 3 PSYCHIATRIC:  Normal affect   EKG:  EKG is not ordered today.     Diagnoses:    1. Syncope and collapse   2. Coronary artery disease involving coronary bypass graft of native heart with unstable angina pectoris ([place])   3. Aortic valve disease   4. Hyperlipidemia LDL goal <70   5. Essential hypertension   6. Acute deep vein thrombosis (DVT) of left peroneal vein (HCC)   7. Chronic anticoagulation   8. Palpitations    Assessment and Plan:     Syncope: One episode of what sounds like vasovagal syncope that occurred while eating dinner after taking a hydrocodone that he had from a past surgery for severe LLE pain.  He was evaluated immediately by a physician who was in the restaurant.  Saw PCP the following day, found to have large DVT on vascular ultrasound. No additional symptoms of lightheadedness, presyncope, syncope.  Was wearing monitor at the time of the event.  Will discontinue monitor today for evaluation of arrhythmia that may have contributed.   DVT: Acute DVT involving left peroneal veins by vascular ultrasound 10/28/2022.  Management per PCP. CTA negative for PE on 11/20. Continues to have LLE pain.  Continue Xarelto.  Palpitations: Wearing 14-day Zio patch for palpitations that occur in the mornings.  He was wearing the monitor during the episode of syncope.  I have remove the monitor for him and he will ship it today, 2 days earlier than originally planned.  Will follow-up based on results of monitor.  Aortic valve disease: Moderate aortic valve stenosis with mean gradient 19.1 mmHg, AVA 0.97 cm2, aortic vlave regurgitation is mild on echo 10/12/2022. No dyspnea, orthopnea, PND, edema, chest pain. Seen by Dr. Tamala Julian on 10/17/22 with plan to return for 1 year follow-up of valve  disease. One episode of syncope that occurred in the setting of severe pain treated with opioid. Plan for repeat echocardiogram 10/2023 with follow-up after as planned at office visit on 11/6.   CAD s/p CABG: History of CABG 2003. He denies chest pain, dyspnea, or other symptoms concerning for angina.  No indication for further ischemic evaluation at this time.   Hyperlipidemia LDL goal < 70: Apolipoprotein B 86, LDL 93 on 08/11/22. On [place]. Need to recheck at next office visit.   Hypertension: BP is well-controlled.     Disposition: TBD following monitor results  Medication Adjustments/Labs and Tests Ordered: Current medicines are reviewed at length with the patient today.  Concerns regarding medicines are outlined above.  No orders of the defined types were placed in this encounter.  No orders of the defined types were placed in this encounter.   Patient Instructions  Medication Instructions:   Your physician recommends that you continue on your current medications as directed. Please refer to the Current Medication list given to you today.   *If you need a refill on your cardiac medications before your next appointment, please call your pharmacy*   Lab Work:  None ordered.  If you have labs (blood work) drawn today and your tests are completely normal, you will receive your results only by: [place] (if you have MyChart) OR A paper copy in the mail If you have any lab test that is abnormal or we need to change your treatment, we will call you to review the results.   Testing/Procedures:  None ordered.   Follow-Up: At [facility],  you and your health needs are our priority.  As part of our continuing mission to provide you with exceptional heart care, we have created designated Provider Care Teams.  These Care Teams include your primary Cardiologist (physician) and Advanced Practice Providers (APPs -  Physician Assistants and Nurse Practitioners) who  all work together to provide you with the care you need, when you need it.  We recommend signing up for the patient portal called "MyChart".  Sign up information is provided on this After Visit Summary.  MyChart is used to connect with patients for Virtual Visits (Telemedicine).  Patients are able to view lab/test results, encounter notes, upcoming appointments, etc.  Non-urgent messages can be sent to your provider as well.   To learn more about what you can do with MyChart, go to [email or web address].    Your next appointment:  Is based on your monitor results.     The format for your next appointment:   In Person  Provider:   Dr. Gasper Sells     Other Instructions  Your physician wants you to follow-up in: 11 months with Dr. Gasper Sells. You will receive a reminder letter in the mail two months in advance. If you don't receive a letter, please call our office to schedule the follow-up appointment.   Important Information About Sugar         Signed, Emmaline Life, NP  11/01/2022 10:03 AM    [place]

## 2022-10-31 ENCOUNTER — Other Ambulatory Visit (HOSPITAL_COMMUNITY): Payer: Self-pay | Admitting: Internal Medicine

## 2022-10-31 ENCOUNTER — Ambulatory Visit (HOSPITAL_COMMUNITY)
Admission: RE | Admit: 2022-10-31 | Discharge: 2022-10-31 | Disposition: A | Discharge status: Home / Self Care | Payer: Medicare Other | Source: Ambulatory Visit | Attending: Internal Medicine | Admitting: Internal Medicine

## 2022-10-31 ENCOUNTER — Other Ambulatory Visit: Payer: Self-pay | Admitting: Family Medicine

## 2022-10-31 ENCOUNTER — Ambulatory Visit (HOSPITAL_COMMUNITY)
Admission: RE | Admit: 2022-10-31 | Discharge: 2022-10-31 | Disposition: A | Discharge status: Home / Self Care | Payer: Medicare Other | Source: Ambulatory Visit | Attending: Family Medicine | Admitting: Family Medicine

## 2022-10-31 DIAGNOSIS — R55 Syncope and collapse: Secondary | ICD-10-CM

## 2022-10-31 DIAGNOSIS — I82452 Acute embolism and thrombosis of left peroneal vein: Secondary | ICD-10-CM | POA: Diagnosis not present

## 2022-10-31 DIAGNOSIS — K5730 Diverticulosis of large intestine without perforation or abscess without bleeding: Secondary | ICD-10-CM | POA: Diagnosis not present

## 2022-10-31 DIAGNOSIS — E785 Hyperlipidemia, unspecified: Secondary | ICD-10-CM | POA: Diagnosis not present

## 2022-10-31 DIAGNOSIS — D6869 Other thrombophilia: Secondary | ICD-10-CM | POA: Diagnosis not present

## 2022-10-31 DIAGNOSIS — N289 Disorder of kidney and ureter, unspecified: Secondary | ICD-10-CM | POA: Diagnosis not present

## 2022-10-31 DIAGNOSIS — I10 Essential (primary) hypertension: Secondary | ICD-10-CM | POA: Diagnosis not present

## 2022-10-31 DIAGNOSIS — R591 Generalized enlarged lymph nodes: Secondary | ICD-10-CM | POA: Insufficient documentation

## 2022-10-31 DIAGNOSIS — R1032 Left lower quadrant pain: Secondary | ICD-10-CM | POA: Diagnosis not present

## 2022-10-31 DIAGNOSIS — M48061 Spinal stenosis, lumbar region without neurogenic claudication: Secondary | ICD-10-CM | POA: Diagnosis not present

## 2022-10-31 MED ORDER — SODIUM CHLORIDE (PF) 0.9 % IJ SOLN
INTRAMUSCULAR | Status: AC
  Filled 2022-10-31: qty 50

## 2022-10-31 MED ORDER — IOHEXOL 350 MG/ML SOLN
100.0000 mL | Freq: Once | INTRAVENOUS | Status: AC | PRN
  Administered 2022-10-31: 100 mL via INTRAVENOUS

## 2022-11-01 ENCOUNTER — Encounter: Payer: Self-pay | Admitting: Nurse Practitioner

## 2022-11-01 ENCOUNTER — Other Ambulatory Visit: Payer: Self-pay | Admitting: Family Medicine

## 2022-11-01 ENCOUNTER — Ambulatory Visit: Payer: Medicare Other | Attending: Nurse Practitioner | Admitting: Nurse Practitioner

## 2022-11-01 VITALS — BP 118/64 | HR 78 | Ht 68.0 in | Wt 182.0 lb

## 2022-11-01 DIAGNOSIS — I25700 Atherosclerosis of coronary artery bypass graft(s), unspecified, with unstable angina pectoris: Secondary | ICD-10-CM | POA: Insufficient documentation

## 2022-11-01 DIAGNOSIS — R55 Syncope and collapse: Secondary | ICD-10-CM | POA: Diagnosis not present

## 2022-11-01 DIAGNOSIS — E785 Hyperlipidemia, unspecified: Secondary | ICD-10-CM | POA: Diagnosis not present

## 2022-11-01 DIAGNOSIS — Z7901 Long term (current) use of anticoagulants: Secondary | ICD-10-CM | POA: Insufficient documentation

## 2022-11-01 DIAGNOSIS — I10 Essential (primary) hypertension: Secondary | ICD-10-CM | POA: Diagnosis not present

## 2022-11-01 DIAGNOSIS — I82452 Acute embolism and thrombosis of left peroneal vein: Secondary | ICD-10-CM | POA: Diagnosis not present

## 2022-11-01 DIAGNOSIS — R591 Generalized enlarged lymph nodes: Secondary | ICD-10-CM

## 2022-11-01 DIAGNOSIS — I359 Nonrheumatic aortic valve disorder, unspecified: Secondary | ICD-10-CM | POA: Insufficient documentation

## 2022-11-01 DIAGNOSIS — R002 Palpitations: Secondary | ICD-10-CM | POA: Diagnosis not present

## 2022-11-01 NOTE — Patient Instructions (Signed)
Medication Instructions:   Your physician recommends that you continue on your current medications as directed. Please refer to the Current Medication list given to you today.   *If you need a refill on your cardiac medications before your next appointment, please call your pharmacy*   Lab Work:  None ordered.  If you have labs (blood work) drawn today and your tests are completely normal, you will receive your results only by: [place] (if you have MyChart) OR A paper copy in the mail If you have any lab test that is abnormal or we need to change your treatment, we will call you to review the results.   Testing/Procedures:  None ordered.   Follow-Up: At [facility], you and your health needs are our priority.  As part of our continuing mission to provide you with exceptional heart care, we have created designated Provider Care Teams.  These Care Teams include your primary Cardiologist (physician) and Advanced Practice Providers (APPs -  Physician Assistants and Nurse Practitioners) who all work together to provide you with the care you need, when you need it.  We recommend signing up for the patient portal called "MyChart".  Sign up information is provided on this After Visit Summary.  MyChart is used to connect with patients for Virtual Visits (Telemedicine).  Patients are able to view lab/test results, encounter notes, upcoming appointments, etc.  Non-urgent messages can be sent to your provider as well.   To learn more about what you can do with MyChart, go to [email or web address].    Your next appointment:  Is based on your monitor results.     The format for your next appointment:   In Person  Provider:   Dr. Gasper Sells     Other Instructions  Your physician wants you to follow-up in: 11 months with Dr. Gasper Sells. You will receive a reminder letter in the mail two months in advance. If you don't receive a letter, please call our office to  schedule the follow-up appointment.   Important Information About Sugar

## 2022-11-02 ENCOUNTER — Ambulatory Visit (INDEPENDENT_AMBULATORY_CARE_PROVIDER_SITE_OTHER): Payer: Medicare Other | Admitting: Vascular Surgery

## 2022-11-02 ENCOUNTER — Encounter: Payer: Self-pay | Admitting: Vascular Surgery

## 2022-11-02 ENCOUNTER — Telehealth: Payer: Self-pay | Admitting: Hematology and Oncology

## 2022-11-02 VITALS — BP 135/79 | HR 85 | Temp 98.2°F | Resp 18 | Ht 68.0 in | Wt 181.6 lb

## 2022-11-02 DIAGNOSIS — I82452 Acute embolism and thrombosis of left peroneal vein: Secondary | ICD-10-CM | POA: Diagnosis not present

## 2022-11-02 NOTE — Telephone Encounter (Signed)
Spoke with patient confirming upcoming new patient appointments 12/20

## 2022-11-02 NOTE — Progress Notes (Signed)
Patient ID: Nathan [name], male   DOB: 1946-11-03, 76 y.o.   MRN: [id number]  Reason for Consult: No chief complaint on file.   Referred by Ginger Organ., MD  Subjective:     HPI:  Nathan [name] is a 76 y.o. male history of extensive deep venous thrombosis going back to last March and recently diagnosed with peroneal vein DVT after having had a negative study in August.  He actually believes that his swelling in his legs began prior to his initial travel when he was diagnosed with DVT.  He does not have any personal or family history of DVT prior to this.  At the time of his original DVT he was having swelling of the left ankle but this resolved.  He did have issues taking Eliquis with total body rash and was subsequently switched to Xarelto which he has tolerated better.  A repeat study was performed in August when he was having issues with Eliquis there was no evidence of DVT at that time.  Just last week he developed pain in the left foot and ankle rating up in the and up to the left hip and buttock.  This has been intermittent in nature.  He has not had any associated swelling.  He denies any recent history of trauma.  No history of similar symptoms in the past.  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    Blood transfusion without reported diagnosis    Complication of anesthesia    jittery after surgery   Coronary artery disease    GERD (gastroesophageal reflux disease)    History of DVT (deep vein thrombosis)    Hyperlipemia    Hypertension    IFG (impaired fasting glucose)    Right carpal tunnel syndrome 02/08/2017   Seasonal allergies    Wears glasses    Family History  Problem Relation Age of Onset   Heart disease Mother    Cancer Father    Alcohol abuse Brother    Kidney failure Brother    Past Surgical History:  Procedure Laterality Date   APPENDECTOMY     age 73    COLONOSCOPY     COLONOSCOPY WITH PROPOFOL N/A 02/15/2016   Procedure: COLONOSCOPY WITH PROPOFOL;  Surgeon: Garlan Fair, MD;  Location: [facility];  Service: Endoscopy;  Laterality: N/A;   CORONARY ARTERY BYPASS GRAFT     2003   ESOPHAGOGASTRODUODENOSCOPY (EGD) WITH PROPOFOL N/A 02/15/2016   Procedure: ESOPHAGOGASTRODUODENOSCOPY (EGD) WITH PROPOFOL;  Surgeon: Garlan Fair, MD;  Location: [facility];  Service: Endoscopy;  Laterality: N/A;   EXPLORATION POST OPERATIVE OPEN HEART     EYE SURGERY     detached retina-lt   INGUINAL HERNIA REPAIR Left 04/01/2014   Procedure: HERNIA REPAIR INGUINAL ADULT;  Surgeon: Shann Medal, MD;  Location: [facility];  Service: General;  Laterality: Left;   INSERTION OF MESH Left 04/01/2014   Procedure: INSERTION OF MESH;  Surgeon: Shann Medal, MD;  Location: [facility];  Service: General;  Laterality: Left;   KNEE ARTHROCENTESIS  1980   right   SHOULDER ACROMIOPLASTY  1985   rt   SINUS ENDO W/FUSION     TONSILLECTOMY AND ADENOIDECTOMY      Short Social History:  Social History   Tobacco Use   Smoking status: Former    Types: Cigarettes  Quit date: 03/26/1969    Years since quitting: 53.6   Smokeless tobacco: Never  Substance Use Topics   Alcohol use: Yes    Alcohol/week: 0.0 standard drinks of alcohol    Comment: 2 per day    Allergies  Allergen Reactions   Bystolic [Nebivolol Hcl] Rash   Lexiscan [Regadenoson] Other (See Comments)    Sharp sternal pain   Triamcinolone Acetonide Acetate [Triamcinolone] Hives   Zetia [Ezetimibe] Diarrhea    GI effects - bloating, cramping, diarrhea on '5mg'$  and '10mg'$  dose   Amlodipine    Hydromorphone     Other Reaction(s): syncope after taking   Apixaban Hives, Itching and Rash    Other reaction(s): Not available   Lipitor [Atorvastatin] Rash   Lisinopril Nausea Only    GI upset   Neosporin [Neomycin-Bacitracin Zn-Polymyx] Rash   Pravastatin Rash   Toprol Xl  [Metoprolol Tartrate] Rash    Current Outpatient Medications  Medication Sig Dispense Refill   ammonium lactate (AMLACTIN) 12 % lotion Apply 1 Application topically as needed for dry skin. 400 g 0   ASPIRIN LOW DOSE 81 MG tablet Take 81 mg by mouth daily.     cetirizine (ZYRTEC) 10 MG tablet Take 10 mg by mouth daily.     cycloSPORINE (RESTASIS) 0.05 % ophthalmic emulsion Place 1 drop into both eyes 2 (two) times daily.      Evolocumab (REPATHA SURECLICK) 132 MG/ML SOAJ INJECT 1 PEN UNDER THE SKIN (SUBCUTANEOUS INJECTION) EVERY 14 DAYS. 2 mL 11   GENTEAL 0.25-0.3 % GEL Apply to eye daily.     HYDROcodone-acetaminophen (NORCO/VICODIN) 5-325 MG tablet every 6 (six) hours as needed for moderate pain or severe pain.     olmesartan (BENICAR) 20 MG tablet Take 20 mg by mouth daily.     Rivaroxaban (XARELTO) 15 MG TABS tablet Take 15 mg by mouth 2 (two) times daily with a meal. Pt takes for '15mg'$  twice per day for 21 days, then '20mg'$  daily     scopolamine (TRANSDERM-SCOP) 1 MG/3DAYS SMARTSIG:1 Patch(s) Topical Every 3 Days PRN     SYSTANE 0.4-0.3 % SOLN Apply to eye as needed.     triamcinolone (NASACORT) 55 MCG/ACT AERO nasal inhaler Place 2 sprays into the nose daily.     No current facility-administered medications for this visit.    Review of Systems  Constitutional:  Constitutional negative. HENT: HENT negative.  Eyes: Eyes negative.  Respiratory: Respiratory negative.  Cardiovascular: Cardiovascular negative. Negative for leg swelling.  GI: Gastrointestinal negative.  Musculoskeletal: Positive for leg pain.  Skin: Positive for rash.  Neurological: Neurological negative. Hematologic: Hematologic/lymphatic negative.  Psychiatric: Psychiatric negative.        Objective:  Objective  Vitals:   11/02/22 1154  BP: 135/79  Pulse: 85  Resp: 18  Temp: 98.2 F (36.8 C)  SpO2: 98%     Physical Exam HENT:     Head: Normocephalic.     Nose: Nose normal.  Eyes:     Pupils: Pupils  are equal, round, and reactive to light.  Cardiovascular:     Rate and Rhythm: Normal rate.     Pulses: Normal pulses.  Pulmonary:     Effort: Pulmonary effort is normal.  Abdominal:     General: Abdomen is flat.     Palpations: Abdomen is soft.  Musculoskeletal:        General: Normal range of motion.     Cervical back: Normal range of motion and neck supple.  Right lower leg: No edema.     Left lower leg: No edema.  Skin:    General: Skin is warm.     Capillary Refill: Capillary refill takes less than 2 seconds.  Neurological:     General: No focal deficit present.     Mental Status: He is alert.  Psychiatric:        Mood and Affect: Mood normal.        Behavior: Behavior normal.        Thought Content: Thought content normal.        Judgment: Judgment normal.     Data: RIGHTCompressibilityPhasicitySpontaneityPropertiesThrombus Aging  +-----+---------------+---------+-----------+----------+--------------+  CFV Full           Yes      Yes                                  +-----+---------------+---------+-----------+----------+--------------+         +---------+---------------+---------+-----------+----------+--------------+   LEFT    CompressibilityPhasicitySpontaneityPropertiesThrombus  Aging  +---------+---------------+---------+-----------+----------+--------------+   CFV     Full           Yes      Yes                                   +---------+---------------+---------+-----------+----------+--------------+   SFJ     Full                                                          +---------+---------------+---------+-----------+----------+--------------+   FV Prox  Full           Yes      Yes                                   +---------+---------------+---------+-----------+----------+--------------+   FV Mid   Full           Yes      Yes                                    +---------+---------------+---------+-----------+----------+--------------+   FV DistalFull           Yes      Yes                                   +---------+---------------+---------+-----------+----------+--------------+   PFV     Full                                                          +---------+---------------+---------+-----------+----------+--------------+   POP     Full           Yes      Yes                                   +---------+---------------+---------+-----------+----------+--------------+  PTV     Full                                                          +---------+---------------+---------+-----------+----------+--------------+   PERO    None           No       No                   Acute            +---------+---------------+---------+-----------+----------+--------------+            Summary:  RIGHT:  - No evidence of common femoral vein obstruction.    LEFT:  - Findings consistent with acute deep vein thrombosis involving the left  peroneal veins.  - No cystic structure found in the popliteal fossa.  - Ultrasound characteristics of enlarged lymph nodes noted in the groin.   I reviewed recent CT abdomen pelvis which does not demonstrate any evidence of May Thurner syndrome or other obstructive central venous process.     Assessment/Plan:    76 year old male with history of extensive left lower extremity DVT that subsequently resolved and now with a new peroneal vein DVT and pain radiating from the foot and ankle up through the knee to the left hip and buttock which is intermittent in nature but which began 1 week ago while walking [place] the [place].  I discussed with the patient a few concerns that I have.  The first is that I do not see any evidence of venous obstructive disease centrally that would have led to such a large DVT earlier this year.  The second is the negative DVT study in August  which I would have expected to see some residual thrombus identified as chronic clot.  The third is that I do not think his current peroneal DVT if in fact acute would not lead to this level of discomfort or this location of discomfort particularly radiating up to the hip and buttock.  Patient is tolerating Xarelto at this time.  I recommended referral to hematology for further evaluation given this is his second blood clot this year without any underlying cause other than travel earlier in the year.  I have also recommend he discuss with his orthopedic surgeon whom he knows well given I do not think peroneal DVT would cause this problem.  Given that he does not have swelling I do not think this is a post thrombotic issue and have not recommended compression stockings although if he does develop swelling this may be beneficial.  He can see me on an as-needed basis.     Waynetta Sandy MD [facility]

## 2022-11-06 ENCOUNTER — Encounter: Payer: Self-pay | Admitting: Certified Registered Nurse Anesthetist

## 2022-11-08 DIAGNOSIS — R002 Palpitations: Secondary | ICD-10-CM | POA: Diagnosis not present

## 2022-11-09 ENCOUNTER — Encounter: Payer: Medicare Other | Admitting: Gastroenterology

## 2022-11-14 ENCOUNTER — Telehealth: Payer: Self-pay | Admitting: Interventional Cardiology

## 2022-11-14 ENCOUNTER — Other Ambulatory Visit (HOSPITAL_COMMUNITY): Payer: Self-pay | Admitting: Internal Medicine

## 2022-11-14 DIAGNOSIS — L820 Inflamed seborrheic keratosis: Secondary | ICD-10-CM | POA: Diagnosis not present

## 2022-11-14 DIAGNOSIS — D1801 Hemangioma of skin and subcutaneous tissue: Secondary | ICD-10-CM | POA: Diagnosis not present

## 2022-11-14 DIAGNOSIS — L720 Epidermal cyst: Secondary | ICD-10-CM | POA: Diagnosis not present

## 2022-11-14 DIAGNOSIS — L821 Other seborrheic keratosis: Secondary | ICD-10-CM | POA: Diagnosis not present

## 2022-11-14 DIAGNOSIS — D2262 Melanocytic nevi of left upper limb, including shoulder: Secondary | ICD-10-CM | POA: Diagnosis not present

## 2022-11-14 DIAGNOSIS — Z85828 Personal history of other malignant neoplasm of skin: Secondary | ICD-10-CM | POA: Diagnosis not present

## 2022-11-14 DIAGNOSIS — L570 Actinic keratosis: Secondary | ICD-10-CM | POA: Diagnosis not present

## 2022-11-14 DIAGNOSIS — L111 Transient acantholytic dermatosis [Grover]: Secondary | ICD-10-CM | POA: Diagnosis not present

## 2022-11-14 DIAGNOSIS — D2261 Melanocytic nevi of right upper limb, including shoulder: Secondary | ICD-10-CM | POA: Diagnosis not present

## 2022-11-14 DIAGNOSIS — M5416 Radiculopathy, lumbar region: Secondary | ICD-10-CM

## 2022-11-14 NOTE — Telephone Encounter (Signed)
Pt wanting results from monitor Reviewed highlights of monitor   and preliminary given and pt aware will call back once Dr Tamala Julian reviews./cy

## 2022-11-14 NOTE — Telephone Encounter (Signed)
Patient calling for heart monitor results.

## 2022-11-16 ENCOUNTER — Telehealth: Payer: Self-pay | Admitting: Interventional Cardiology

## 2022-11-16 NOTE — Telephone Encounter (Signed)
Spoke with patient, see phone note from 11/16/2022.

## 2022-11-16 NOTE — Telephone Encounter (Signed)
Spoke with patient and discussed results of heart monitor.  Per Dr. Tamala Julian: Let the patient know the monitor revealed brief episodes of supraventricular tachycardia and 1 episode of 6 beat ventricular tachycardia. Both of these should be treated with low-dose beta-blocker therapy, metoprolol succinate 25 mg/day. No atrial fibrillation was noted.    Patient states he is concerned about starting beta-blocker therapy. He states he has had episodes of lightheadedness associated with decreased BP. He reports yesterday (11/15/22) he was touring homes and he felt lightheaded and states his eyes were dilated. He did not have any blood pressure readings to report.  Patient saw Christen Bame, NP on 11/01/2022 for syncopal episodes, no changes were made at this appointment, awaiting monitor results at that time.  Will forward to Dr. Tamala Julian to review and advise.

## 2022-11-16 NOTE — Telephone Encounter (Signed)
-----   Message from Belva Crome, MD sent at 11/16/2022  9:28 AM EST ----- Let the patient know the monitor revealed brief episodes of supraventricular tachycardia and 1 episode of 6 beat ventricular tachycardia.  Both of these should be treated with low-dose beta-blocker therapy, metoprolol succinate 25 mg/day.  No atrial fibrillation was noted. A copy will be sent to Stacie Glaze, DO

## 2022-11-19 ENCOUNTER — Ambulatory Visit (HOSPITAL_COMMUNITY)
Admission: RE | Admit: 2022-11-19 | Discharge: 2022-11-19 | Disposition: A | Discharge status: Home / Self Care | Payer: Medicare Other | Source: Ambulatory Visit | Attending: Internal Medicine | Admitting: Internal Medicine

## 2022-11-19 DIAGNOSIS — M5416 Radiculopathy, lumbar region: Secondary | ICD-10-CM | POA: Diagnosis not present

## 2022-11-19 DIAGNOSIS — M48061 Spinal stenosis, lumbar region without neurogenic claudication: Secondary | ICD-10-CM | POA: Diagnosis not present

## 2022-11-22 NOTE — Telephone Encounter (Signed)
Pt calling back for an update on questions he had following monitor results

## 2022-11-22 NOTE — Telephone Encounter (Signed)
Returned call to patient.  Shared with him Dr. Thompson Caul reply to last phone conversation: Well, I would recommend watchful waiting.  He did not have atrial fib., so does not need anticoagulation.    Patient states he still has episodes of lightheadedness when standing, though this occurs infrequently and patient reports it is not bothersome and seems to be improving over time.  Patient states he had a GI infection a couple months ago and was given antibiotics for this, which he believes caused most of his recent issues with syncope and lightheadedness. He reports since being off of the antibiotics he feels that these symptoms are slowly improving.  Patient would like to hold off on starting metoprolol that was recommended based on heart monitor results.  Advised patient to call our office if he experiences increased episodes of lightheadedness or if it is more bothersome to him, or if he experiences any palpitations.  Patient verbalized understanding and expressed appreciation for follow-up.

## 2022-11-28 DIAGNOSIS — M1811 Unilateral primary osteoarthritis of first carpometacarpal joint, right hand: Secondary | ICD-10-CM | POA: Diagnosis not present

## 2022-11-28 DIAGNOSIS — Z86718 Personal history of other venous thrombosis and embolism: Secondary | ICD-10-CM | POA: Diagnosis not present

## 2022-11-28 DIAGNOSIS — M13831 Other specified arthritis, right wrist: Secondary | ICD-10-CM | POA: Diagnosis not present

## 2022-11-28 DIAGNOSIS — G5601 Carpal tunnel syndrome, right upper limb: Secondary | ICD-10-CM | POA: Diagnosis not present

## 2022-11-28 DIAGNOSIS — M65342 Trigger finger, left ring finger: Secondary | ICD-10-CM | POA: Diagnosis not present

## 2022-11-28 DIAGNOSIS — M720 Palmar fascial fibromatosis [Dupuytren]: Secondary | ICD-10-CM | POA: Diagnosis not present

## 2022-11-30 ENCOUNTER — Inpatient Hospital Stay: Payer: Medicare Other | Attending: Hematology and Oncology | Admitting: Hematology and Oncology

## 2022-11-30 ENCOUNTER — Inpatient Hospital Stay: Payer: Medicare Other

## 2022-11-30 VITALS — BP 152/69 | HR 89 | Temp 97.7°F | Resp 16 | Wt 186.1 lb

## 2022-11-30 DIAGNOSIS — Z8249 Family history of ischemic heart disease and other diseases of the circulatory system: Secondary | ICD-10-CM | POA: Insufficient documentation

## 2022-11-30 DIAGNOSIS — Z79899 Other long term (current) drug therapy: Secondary | ICD-10-CM | POA: Diagnosis not present

## 2022-11-30 DIAGNOSIS — Z7901 Long term (current) use of anticoagulants: Secondary | ICD-10-CM | POA: Insufficient documentation

## 2022-11-30 DIAGNOSIS — Z87891 Personal history of nicotine dependence: Secondary | ICD-10-CM | POA: Diagnosis not present

## 2022-11-30 DIAGNOSIS — I82462 Acute embolism and thrombosis of left calf muscular vein: Secondary | ICD-10-CM

## 2022-11-30 DIAGNOSIS — I2510 Atherosclerotic heart disease of native coronary artery without angina pectoris: Secondary | ICD-10-CM | POA: Diagnosis not present

## 2022-11-30 DIAGNOSIS — K219 Gastro-esophageal reflux disease without esophagitis: Secondary | ICD-10-CM | POA: Insufficient documentation

## 2022-11-30 DIAGNOSIS — E785 Hyperlipidemia, unspecified: Secondary | ICD-10-CM | POA: Insufficient documentation

## 2022-11-30 DIAGNOSIS — I10 Essential (primary) hypertension: Secondary | ICD-10-CM | POA: Diagnosis not present

## 2022-11-30 DIAGNOSIS — Z8052 Family history of malignant neoplasm of bladder: Secondary | ICD-10-CM | POA: Insufficient documentation

## 2022-11-30 DIAGNOSIS — I82412 Acute embolism and thrombosis of left femoral vein: Secondary | ICD-10-CM | POA: Diagnosis not present

## 2022-11-30 LAB — CBC WITH DIFFERENTIAL (CANCER CENTER ONLY)
Abs Immature Granulocytes: 0.03 10*3/uL (ref 0.00–0.07)
Basophils Absolute: 0 10*3/uL (ref 0.0–0.1)
Basophils Relative: 0 %
Eosinophils Absolute: 0 10*3/uL (ref 0.0–0.5)
Eosinophils Relative: 0 %
HCT: 38 % — ABNORMAL LOW (ref 39.0–52.0)
Hemoglobin: 13 g/dL (ref 13.0–17.0)
Immature Granulocytes: 0 %
Lymphocytes Relative: 19 %
Lymphs Abs: 1.3 10*3/uL (ref 0.7–4.0)
MCH: 31.2 pg (ref 26.0–34.0)
MCHC: 34.2 g/dL (ref 30.0–36.0)
MCV: 91.1 fL (ref 80.0–100.0)
Monocytes Absolute: 0.8 10*3/uL (ref 0.1–1.0)
Monocytes Relative: 12 %
Neutro Abs: 4.7 10*3/uL (ref 1.7–7.7)
Neutrophils Relative %: 69 %
Platelet Count: 186 10*3/uL (ref 150–400)
RBC: 4.17 MIL/uL — ABNORMAL LOW (ref 4.22–5.81)
RDW: 11.9 % (ref 11.5–15.5)
WBC Count: 6.9 10*3/uL (ref 4.0–10.5)
nRBC: 0 % (ref 0.0–0.2)

## 2022-11-30 LAB — CMP (CANCER CENTER ONLY)
ALT: 19 U/L (ref 0–44)
AST: 17 U/L (ref 15–41)
Albumin: 4 g/dL (ref 3.5–5.0)
Alkaline Phosphatase: 44 U/L (ref 38–126)
Anion gap: 3 — ABNORMAL LOW (ref 5–15)
BUN: 29 mg/dL — ABNORMAL HIGH (ref 8–23)
CO2: 31 mmol/L (ref 22–32)
Calcium: 9.4 mg/dL (ref 8.9–10.3)
Chloride: 105 mmol/L (ref 98–111)
Creatinine: 0.95 mg/dL (ref 0.61–1.24)
GFR, Estimated: >60 mL/min (ref >60)
Glucose, Bld: 97 mg/dL (ref 70–99)
Potassium: 4.2 mmol/L (ref 3.5–5.1)
Sodium: 139 mmol/L (ref 135–145)
Total Bilirubin: 0.3 mg/dL (ref 0.3–1.2)
Total Protein: 6.7 g/dL (ref 6.5–8.1)

## 2022-11-30 NOTE — Progress Notes (Signed)
[facility] [phone]   Fax:([phone] [place] NOTE  [name] Care Team: Ginger Organ., MD as PCP - General (Internal Medicine) Belva Crome, MD as PCP - Cardiology (Cardiology) Alphonsa Overall, MD as Consulting Physician (General Surgery)  Hematological/Oncological History # Left Lower Extremity DVT DVT 02/28/2022: Lower Extremity US showed acute deep vein thrombosis involving Nathan left common femoral vein, SF junction, left femoral vein, left proximal profunda vein, left popliteal vein, left posterior tibial veins, and left peroneal veins. Provoked by 14 hour flight.   10/28/2022: Lower Extremity US showed acute DVT in left peroneal veins.  11/30/2022: establish care with Dr. Lorenso Courier   CHIEF COMPLAINTS/PURPOSE OF CONSULTATION:  "Left Lower Extremity DVT DVT "  HISTORY OF PRESENTING ILLNESS:  Nathan [name] 76 y.o. male with medical history significant for CAD, GERD, hypertension, hyperlipidemia, and seasonal allergies who presents for evaluation of left lower extremity DVT.  On review of Nathan previous records Nathan [name] underwent a left lower extremity ultrasound on 10/28/2022.  At that time he was found to have acute DVT in Nathan left peroneal veins.  He was started on Xarelto therapy.  Due to concern for these findings Nathan [name] was referred to hematology for further evaluation and management.  On exam today Nathan [name] reports that he would "like to find a way to discontinue anticoagulation".  He notes that in March 2023 he took 2 flights to [place], [place].  After that time he developed his blood clot approximately 4 weeks later.  He said he woke up in Nathan morning with intense pain in his ankle.  His ankle was also quite swollen.  He reports that as soon as his primary care provider saw him he was told that he had a blood clot.  He notes that he underwent his ultrasound "right away" and was found to have Nathan blood clot and started on Eliquis within  3 hours of Nathan diagnosis.  He reports that unfortunate Eliquis he broke out in a severe rash after 5 weeks.  He does have a lot of other medication allergies.  He unfortunately was hospitalized and then was on Lovenox with subsequent transition to Xarelto.  He notes that he had transition from Xarelto to aspirin after completing a full course.  He was walking Nathan track at [facility] well approximately 2.5 miles per walk.  He reports today after his birthday his whole leg became painful and he was "dragging his leg".  He reports that he underwent another round of ultrasounds and was found to have a smaller clot in Nathan peroneal vein.  He notes that he is tolerating Xarelto quite well but does occasionally upset his bowels.  It causes his movements to be loose/soft and he has twice daily by all movements.  He notes that Nathan cost of medication is free.  He does take gabapentin to help with his neuropathic pain.  He reports that he previously did smoke early in life in college but has since quit.  He notes that he typically drinks about 2 glasses of whiskey per day.  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  He reports his mother had congestive heart failure and his father had bladder cancer.  He had a half brother who had type 2 diabetes.  He does not have any children.  He otherwise denies any fevers, chills, sweats, nausea, vomiting or diarrhea.  A full 10 point ROS was otherwise negative.  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    Blood transfusion without reported diagnosis    Complication of anesthesia    jittery after surgery   Coronary artery disease    GERD (gastroesophageal reflux disease)    History of DVT (deep vein thrombosis)    Hyperlipemia    Hypertension    IFG (impaired fasting glucose)    Right carpal tunnel syndrome 02/08/2017   Seasonal allergies    Wears  glasses     SURGICAL HISTORY: Past Surgical History:  Procedure Laterality Date   APPENDECTOMY     age 75   COLONOSCOPY     COLONOSCOPY WITH PROPOFOL N/A 02/15/2016   Procedure: COLONOSCOPY WITH PROPOFOL;  Surgeon: Garlan Fair, MD;  Location: [facility];  Service: Endoscopy;  Laterality: N/A;   CORONARY ARTERY BYPASS GRAFT     2003   ESOPHAGOGASTRODUODENOSCOPY (EGD) WITH PROPOFOL N/A 02/15/2016   Procedure: ESOPHAGOGASTRODUODENOSCOPY (EGD) WITH PROPOFOL;  Surgeon: Garlan Fair, MD;  Location: [facility];  Service: Endoscopy;  Laterality: N/A;   EXPLORATION POST OPERATIVE OPEN HEART     EYE SURGERY     detached retina-lt   INGUINAL HERNIA REPAIR Left 04/01/2014   Procedure: HERNIA REPAIR INGUINAL ADULT;  Surgeon: Shann Medal, MD;  Location: [facility];  Service: General;  Laterality: Left;   INSERTION OF MESH Left 04/01/2014   Procedure: INSERTION OF MESH;  Surgeon: Shann Medal, MD;  Location: [facility];  Service: General;  Laterality: Left;   KNEE ARTHROCENTESIS  1980   right   SHOULDER ACROMIOPLASTY  1985   rt   SINUS ENDO W/FUSION     TONSILLECTOMY AND ADENOIDECTOMY      SOCIAL HISTORY: Social History   Socioeconomic History   Marital status: Widowed    Spouse name: Not on file   Number of children: Not on file   Years of education: post grad   Highest education level: Not on file  Occupational History   Occupation: [employer]  Tobacco Use   Smoking status: Former    Types: Cigarettes    Quit date: 03/26/1969    Years since quitting: 53.7   Smokeless tobacco: Never  Substance and Sexual Activity   Alcohol use: Yes    Alcohol/week: 0.0 standard drinks of alcohol    Comment: 2 per day   Drug use: No   Sexual activity: Not Currently  Other Topics Concern   Not on file  Social History Narrative   [name] drinks 1-2 cups of caffeine daily.   [name] is left handed.    Social Determinants of Health   Financial Resource  Strain: Not on file  Food Insecurity: Not on file  Transportation Needs: Not on file  Physical Activity: Not on file  Stress: Not on file  Social Connections: Not on file  Intimate Partner Violence: Not on file    FAMILY HISTORY: Family History  Problem Relation Age of Onset   Heart disease Mother    Cancer Father    Alcohol abuse Brother    Kidney failure Brother     ALLERGIES:  is allergic to [employer] hcl], lexiscan [regadenoson], triamcinolone acetonide acetate [triamcinolone], zetia [ezetimibe], amlodipine, hydromorphone, apixaban, lipitor [atorvastatin], lisinopril, neosporin [neomycin-bacitracin zn-polymyx], pravastatin, and toprol xl [metoprolol tartrate].  MEDICATIONS:  Current Outpatient Medications  Medication Sig Dispense Refill   eszopiclone (LUNESTA) 2 MG TABS tablet  Take 2 mg by mouth at bedtime as needed.     gabapentin (NEURONTIN) 300 MG capsule Take 300 mg by mouth 3 (three) times daily.     XARELTO 20 MG TABS tablet Take 20 mg by mouth daily.     ammonium lactate (AMLACTIN) 12 % lotion Apply 1 Application topically as needed for dry skin. 400 g 0   cetirizine (ZYRTEC) 10 MG tablet Take 10 mg by mouth daily.     cycloSPORINE (RESTASIS) 0.05 % ophthalmic emulsion Place 1 drop into both eyes 2 (two) times daily.      Evolocumab (REPATHA SURECLICK) 767 MG/ML SOAJ INJECT 1 PEN UNDER Nathan SKIN (SUBCUTANEOUS INJECTION) EVERY 14 DAYS. 2 mL 11   GENTEAL 0.25-0.3 % GEL Apply to eye daily.     HYDROcodone-acetaminophen (NORCO/VICODIN) 5-325 MG tablet every 6 (six) hours as needed for moderate pain or severe pain.     olmesartan (BENICAR) 20 MG tablet Take 20 mg by mouth daily.     scopolamine (TRANSDERM-SCOP) 1 MG/3DAYS SMARTSIG:1 Patch(s) Topical Every 3 Days PRN     SYSTANE 0.4-0.3 % SOLN Apply to eye as needed.     triamcinolone (NASACORT) 55 MCG/ACT AERO nasal inhaler Place 2 sprays into Nathan nose daily.     No current facility-administered medications for  this visit.    REVIEW OF SYSTEMS:   Constitutional: ( - ) fevers, ( - )  chills , ( - ) night sweats Eyes: ( - ) blurriness of vision, ( - ) double vision, ( - ) watery eyes Ears, nose, mouth, throat, and face: ( - ) mucositis, ( - ) sore throat Respiratory: ( - ) cough, ( - ) dyspnea, ( - ) wheezes Cardiovascular: ( - ) palpitation, ( - ) chest discomfort, ( - ) lower extremity swelling Gastrointestinal:  ( - ) nausea, ( - ) heartburn, ( - ) change in bowel habits Skin: ( - ) abnormal skin rashes Lymphatics: ( - ) new lymphadenopathy, ( - ) easy bruising Neurological: ( - ) numbness, ( - ) tingling, ( - ) new weaknesses Behavioral/Psych: ( - ) mood change, ( - ) new changes  All other systems were reviewed with Nathan [name] and are negative.  PHYSICAL EXAMINATION:  Vitals:   11/30/22 0852  BP: (!) 152/69  Pulse: 89  Resp: 16  Temp: 97.7 F (36.5 C)  SpO2: 97%   Filed Weights   11/30/22 0852  Weight: 186 lb 1.6 oz (84.4 kg)    GENERAL: well appearing notably Caucasian male in NAD  SKIN: skin color, texture, turgor are normal, no rashes or significant lesions EYES: conjunctiva are pink and non-injected, sclera clear LUNGS: clear to auscultation and percussion with normal breathing effort HEART: regular rate & rhythm and no murmurs and no lower extremity edema Musculoskeletal: no cyanosis of digits and no clubbing  PSYCH: alert & oriented x 3, fluent speech NEURO: no focal motor/sensory deficits  LABORATORY DATA:  I have reviewed Nathan data as listed    Latest Ref Rng & Units 11/30/2022   10:23 AM 11/09/2017    9:17 AM 09/14/2017    8:43 AM  CBC  WBC 4.0 - 10.5 K/uL 6.9  4.3  11.2   Hemoglobin 13.0 - 17.0 g/dL 13.0  14.3  14.1   Hematocrit 39.0 - 52.0 % 38.0  41.6  39.7   Platelets 150 - 400 K/uL 186  195  219        Latest Ref Rng &  Units 11/30/2022   10:23 AM 11/23/2017    8:53 AM 11/09/2017    9:17 AM  CMP  Glucose 70 - 99 mg/dL 97  106    BUN 8 - 23 mg/dL  29  15    Creatinine 0.61 - 1.24 mg/dL 0.95  0.82    Sodium 135 - 145 mmol/L 139  142    Potassium 3.5 - 5.1 mmol/L 4.2  4.5    Chloride 98 - 111 mmol/L 105  104    CO2 22 - 32 mmol/L 31  27    Calcium 8.9 - 10.3 mg/dL 9.4  9.3    Total Protein 6.5 - 8.1 g/dL 6.7   6.4   Total Bilirubin 0.3 - 1.2 mg/dL 0.3   0.4   Alkaline Phos 38 - 126 U/L 44     AST 15 - 41 U/L 17   21   ALT 0 - 44 U/L 19   23      ASSESSMENT & PLAN JIYAAN STEINHAUSER 76 y.o. male with medical history significant for CAD, GERD, hypertension, hyperlipidemia, and seasonal allergies who presents for evaluation of left lower extremity DVT.  After review of Nathan labs, review of Nathan records, and discussion with Nathan [name] Nathan patients findings are most consistent with recurrent DVTs, 1 provoked and 1 unprovoked.  A provoked venous thromboembolism (VTE) is one that has a clear inciting factor or event. Provoking factors include prolonged travel/immobility, surgery (particular abdominal or orthropedic), trauma,  and pregnancy/ estrogen containing birth control. After a detailed history and review of Nathan records there is no clear provoking factor for this [name]'s VTE.  Patients with unprovoked VTEs have up to 25% recurrence after 5 years and 36% at 10 years, with 4% of these clots being fatal (BMJ?2019;[id number]). Therefore Nathan formal recommendation for unprovoked VTE's is lifelong anticoagulation, as Nathan cause may not be transient or reversible. We recommend 6 months or full strength anticoagulation with a re-evaluation after that time.  Nathan [name]'s will then have a choice of maintenance dose DOAC (preferred, recommended), '81mg'$  ASA PO daily (non-preferred), or no further anticoagulation (not recommended).    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  Lupus anticoagulant panel would be altered by presence of blood thinner, will hold on this testing.   --recommend Nathan [name] continue xarelto '20mg'$  PO daily  --[name] denies any bleeding, bruising, or dark stools on this medication. It is well tolerated. No difficulties accessing/affording Nathan medication  --RTC in 5 months' time with strict return precautions for overt signs of bleeding.  At that time we will discuss transition to maintenance dose Xarelto.  [name] is eager to get off anticoagulation and would want to consider aspirin.  Orders Placed This Encounter  Procedures   CBC with Differential ([facility] Only)    Standing Status:   Future    Number of Occurrences:   1    Standing Expiration Date:   12/01/2023   CMP ([place] only)    Standing Status:   Future    Number of Occurrences:   1    Standing Expiration Date:   12/01/2023   Cardiolipin antibodies, IgG, IgM, IgA*    Standing Status:   Future    Number of Occurrences:   1  Standing Expiration Date:   11/30/2023   Beta-2-glycoprotein i abs, IgG/M/A    Standing Status:   Future    Number of Occurrences:   1    Standing Expiration Date:   11/30/2023    All questions were answered. Nathan [name] knows to call Nathan clinic with any problems, questions or concerns.  A total of more than 60 minutes were spent on this encounter with face-to-face time and non-face-to-face time, including preparing to see Nathan [name], ordering tests and/or medications, counseling Nathan [name] and coordination of care as outlined above.   Ledell Peoples, MD Department of Hematology/Oncology [place] at [facility] Phone: [phone] Pager: [phone] Email: [email or web address]  12/03/2022 12:30 PM

## 2022-12-01 LAB — CARDIOLIPIN ANTIBODIES, IGG, IGM, IGA
Anticardiolipin IgA: <9 APL U/mL (ref 0–11)
Anticardiolipin IgG: <9 GPL U/mL (ref 0–14)
Anticardiolipin IgM: 28 MPL U/mL — ABNORMAL HIGH (ref 0–12)

## 2022-12-01 LAB — BETA-2-GLYCOPROTEIN I ABS, IGG/M/A
Beta-2 Glyco I IgG: <9 GPI IgG units (ref 0–20)
Beta-2-Glycoprotein I IgA: <9 GPI IgA units (ref 0–25)
Beta-2-Glycoprotein I IgM: 31 GPI IgM units (ref 0–32)

## 2022-12-06 ENCOUNTER — Telehealth: Payer: Self-pay | Admitting: *Deleted

## 2022-12-06 ENCOUNTER — Telehealth: Payer: Self-pay | Admitting: Hematology and Oncology

## 2022-12-06 NOTE — Telephone Encounter (Signed)
Patient wanted review of his most recent lab results.  Routed to Dr Lorenso Courier to please review and advise.

## 2022-12-06 NOTE — Telephone Encounter (Signed)
Called to notify patient of new appointment. Patient hung up before date and time could be stated. Mailing reminder.

## 2022-12-08 ENCOUNTER — Telehealth: Payer: Self-pay | Admitting: *Deleted

## 2022-12-08 NOTE — Telephone Encounter (Signed)
TCT patient regarding recent lab results. Advised  that his blood work does not show any concerning abnormalities.  Recommend that he continue on his current anticoagulation therapy.  We will plan to see him back in May 2024.  Pt voiced understanding. He is aware of his next appt with Dr. Lorenso Courier

## 2022-12-15 DIAGNOSIS — M4316 Spondylolisthesis, lumbar region: Secondary | ICD-10-CM | POA: Diagnosis not present

## 2022-12-15 DIAGNOSIS — Z6827 Body mass index (BMI) 27.0-27.9, adult: Secondary | ICD-10-CM | POA: Diagnosis not present

## 2022-12-15 DIAGNOSIS — M5416 Radiculopathy, lumbar region: Secondary | ICD-10-CM | POA: Diagnosis not present

## 2023-01-05 DIAGNOSIS — E785 Hyperlipidemia, unspecified: Secondary | ICD-10-CM | POA: Diagnosis not present

## 2023-01-05 DIAGNOSIS — I82452 Acute embolism and thrombosis of left peroneal vein: Secondary | ICD-10-CM | POA: Diagnosis not present

## 2023-01-05 DIAGNOSIS — I25810 Atherosclerosis of coronary artery bypass graft(s) without angina pectoris: Secondary | ICD-10-CM | POA: Diagnosis not present

## 2023-01-05 DIAGNOSIS — D6869 Other thrombophilia: Secondary | ICD-10-CM | POA: Diagnosis not present

## 2023-01-05 DIAGNOSIS — M48061 Spinal stenosis, lumbar region without neurogenic claudication: Secondary | ICD-10-CM | POA: Diagnosis not present

## 2023-01-05 DIAGNOSIS — R42 Dizziness and giddiness: Secondary | ICD-10-CM | POA: Diagnosis not present

## 2023-01-05 DIAGNOSIS — M25572 Pain in left ankle and joints of left foot: Secondary | ICD-10-CM | POA: Diagnosis not present

## 2023-01-05 DIAGNOSIS — R7301 Impaired fasting glucose: Secondary | ICD-10-CM | POA: Diagnosis not present

## 2023-01-05 DIAGNOSIS — K580 Irritable bowel syndrome with diarrhea: Secondary | ICD-10-CM | POA: Diagnosis not present

## 2023-01-05 DIAGNOSIS — G720 Drug-induced myopathy: Secondary | ICD-10-CM | POA: Diagnosis not present

## 2023-01-05 DIAGNOSIS — I700 Atherosclerosis of aorta: Secondary | ICD-10-CM | POA: Diagnosis not present

## 2023-01-05 DIAGNOSIS — I10 Essential (primary) hypertension: Secondary | ICD-10-CM | POA: Diagnosis not present

## 2023-01-06 ENCOUNTER — Telehealth: Payer: Self-pay | Admitting: Pharmacist

## 2023-01-06 NOTE — Telephone Encounter (Signed)
Pt called my line in clinic since I've helped manage his BP in the past, although it's been 5 years or so since I've seen him in clinic.  He reports he's had an increase frequency of lightheadedness and has had 5 "white outs" where he feels his vision coming in and like he's in a fog. Lasts for about 10 minutes and he needs to sit down when it happens. Hasn't happened at home for him to be able to check his BP during any of these spells.  States he saw his PCP who told him to call cardiology instead. He saw Sharyn Lull in 10/2022 for syncope. Wore a monitor that showed brief episodes of supraventricular tachycardia and 1 episode of 6 beat ventricular tachycardia. Dr Tamala Julian recommended beta blocker therapy, however pt declined as he was worried about his BP dropping too low.   Advised him that his Lunesta and gabapentin could be contributing to dizziness, but that he may need in office evaluation and that his concerns are outside of my scope of practice. He's aware I'm forwarding his message to Sharyn Lull to address next week since she saw him most recently (was a Dr Tamala Julian pt, hasn't established with Dr Gasper Sells yet).

## 2023-01-08 NOTE — Telephone Encounter (Signed)
Would recommend that he schedule a soon appointment with a provider in our office, either Dr. Gasper Sells or APP. Please ask if his PCP addressed whether he felt symptoms were secondary to non-cardiac medications or if orthostatic vital signs were done? Please have office visit notes from PCP faxed to our office.

## 2023-01-09 DIAGNOSIS — I82409 Acute embolism and thrombosis of unspecified deep veins of unspecified lower extremity: Secondary | ICD-10-CM | POA: Diagnosis not present

## 2023-01-09 DIAGNOSIS — M25562 Pain in left knee: Secondary | ICD-10-CM | POA: Diagnosis not present

## 2023-01-09 DIAGNOSIS — M79605 Pain in left leg: Secondary | ICD-10-CM | POA: Diagnosis not present

## 2023-01-10 ENCOUNTER — Telehealth: Payer: Self-pay | Admitting: *Deleted

## 2023-01-10 NOTE — Telephone Encounter (Signed)
Pt is seeing Lorenda Peck on 2/2. Will forward to [place].

## 2023-01-10 NOTE — Progress Notes (Signed)
Cardiology Office Note:    Date:  01/13/2023   ID:  Gopal, Malter [birth year], MRN [id number]  PCP:  Ginger Organ., MD   [facility] Providers Cardiologist:  Sinclair Grooms, MD (Inactive)     Referring MD: Ginger Organ., MD   Chief Complaint: syncope  History of Present Illness:    Nathan [name] is a pleasant 77 y.o. male with a hx of CAD s/p CABG 2003, HTN, HLD,DVT, and moderate AS.   Referred back to cardiology by PCP for evaluation of murmur and seen by Dr. Tamala Julian on 10/17/2019. History of chest burning that improved on proton pump inhibitor.  Seen in clinic on 10/17/2022 by Dr. Tamala Julian. He reported for the past 2 weeks he awakened from sleep with fluttering and palpitations in his chest.  He had multiple episodes over a year ago and we could never identify the etiology. Developed DVT LLE after long flight from [place].  Developed hives on Eliquis and ultimately treated with Xarelto for 5 months then stopped.  2D echo 10/12/22 revealed LVEF 60 to 65%, no RWMA, mild LVH, G1 DD, normal RV, mild MR, moderate aortic valve stenosis with mean gradient 19.1 mmHg and AVA 0.97 cm2. Zio monitor ordered for 14 days. Advised to return in 1 year with echo prior (will transfer from Dr. Tamala Julian to Dr. Gasper Sells).   Venous ultrasound 10/28/2022 positive for left DVT. CTA 10/31/22 negative for acute PE.   Seen by me in cardiology clinic on 11/01/22. Reported that he was recently walking at [facility] on 11/15, where he usually walks 2 1/2 miles. Was feeling well until he developed severe LLE pain from the groin down to the ankle. He thought he could walk it out but it never got better. The following day, was going out to dinner and took a hydromorphone for pain prior to leaving. After eating dinner, he became very diaphoretic then next thing he was aware of was being surrounded by a doctor and a nurse. He had slumped forward, passed out and vomited. He received reassurance from the  medical providers at the scene and also talked with PCP the evening who advised him to follow-up as an outpatient.  He continued to vomit but had no additional symptoms. No presyncope, syncope since that time. Is currently wearing a Zio monitor for palpitations that always occurr when waking up in the mornings. Symptoms do not last very long and are not associated with chest pain, shortness of breath, or diaphoresis. Due to the size of the DVT, felt it developed over the past year. Recalls that his legs were swollen one year ago when in [place].  At that visit, he was instructed to discontinue monitor and return if for evaluation.  No additional changes to treatment plan.  Cardiac monitor revealed underlying rhythm NSR with average HR 82 bpm, one run of NSVT lasting 6 beats, 9 episodes of SVT with longest lasting 13 beats, PVC burden less than 1%, no atrial fibrillation. He was advised to start metoprolol succinate 25 mg daily, however he wished to hold off on starting medication.  He contacted our office 01/06/2023 with reports of increased frequency of lightheadedness. Reported 5 "white outs" where he feels his vision coming in like a fog which last for about 10 minutes.  Feels the need to sit down when this happens. Has not been able to check BP during any of the spells. Was advised by our pharmacist that [facility] and  gabapentin could be contributing to dizziness. He reported he was told by PCP to see cardiology.  Today, he is here for evaluation of of lightheadedness. Reports symptoms occur periodically. Lightheadedness is different than episodes he describes as "white outs." The "white out" episodes have occurred at times when he was going into a building from outside, and on another occasion when he was driving. States it feels like everything is going white, like he is driving into fog. Has had extensive eye exam and has seen a retina specialist. 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 Lightheadedness only occurs when going from sitting to standing. If he sits for a few seconds and stands up again, he does not feel lightheaded. Saw PCP who suggested decreasing olmesartan to half a tablet. He has not been able to monitor his BP at the time specific time that symptoms occur. Evaluated by hematology with no significant finding to explain blood clot. He takes gabapentin for back problems. Has been referred to PT. Does not feel that symptoms worsened when he started any of his medications and symptoms do not intensify after taking medications. Thinks he stays well hydrated, urine is fairly pale yellow. Has 1 or 2 alcohol drinks per night. No palpitations since the one episode in November. No chest pain, edema, orthopnea, PND, melena, syncope.    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    Blood transfusion without reported diagnosis    Complication of anesthesia    jittery after surgery   Coronary artery disease    GERD (gastroesophageal reflux disease)    History of DVT (deep vein thrombosis)    Hyperlipemia    Hypertension    IFG (impaired fasting glucose)    Right carpal tunnel syndrome 02/08/2017   Seasonal allergies    Wears glasses     Past Surgical History:  Procedure Laterality Date   APPENDECTOMY     age 64   COLONOSCOPY     COLONOSCOPY WITH PROPOFOL N/A 02/15/2016   Procedure: COLONOSCOPY WITH PROPOFOL;  Surgeon: Garlan Fair, MD;  Location: [facility];  Service: Endoscopy;  Laterality: N/A;   CORONARY ARTERY BYPASS GRAFT     2003   ESOPHAGOGASTRODUODENOSCOPY (EGD) WITH PROPOFOL N/A 02/15/2016   Procedure: ESOPHAGOGASTRODUODENOSCOPY (EGD) WITH PROPOFOL;  Surgeon: Garlan Fair, MD;  Location: [facility];  Service: Endoscopy;  Laterality: N/A;   EXPLORATION POST OPERATIVE OPEN HEART     EYE SURGERY     detached retina-lt   INGUINAL HERNIA REPAIR Left 04/01/2014    Procedure: HERNIA REPAIR INGUINAL ADULT;  Surgeon: Shann Medal, MD;  Location: [facility];  Service: General;  Laterality: Left;   INSERTION OF MESH Left 04/01/2014   Procedure: INSERTION OF MESH;  Surgeon: Shann Medal, MD;  Location: [facility];  Service: General;  Laterality: Left;   KNEE ARTHROCENTESIS  1980   right   SHOULDER ACROMIOPLASTY  1985   rt   SINUS ENDO W/FUSION     TONSILLECTOMY AND ADENOIDECTOMY      Current Medications: Current Meds  Medication Sig   ammonium lactate (AMLACTIN) 12 % lotion Apply 1 Application topically as needed for dry skin.   cetirizine (ZYRTEC) 10 MG tablet Take 10 mg by mouth daily.   cycloSPORINE (RESTASIS) 0.05 % ophthalmic emulsion Place 1 drop into both eyes 2 (two) times daily.  eszopiclone (LUNESTA) 2 MG TABS tablet Take 2 mg by mouth at bedtime as needed.   Evolocumab (REPATHA SURECLICK) 697 MG/ML SOAJ INJECT 1 PEN UNDER THE SKIN (SUBCUTANEOUS INJECTION) EVERY 14 DAYS.   gabapentin (NEURONTIN) 300 MG capsule Take 300 mg by mouth 3 (three) times daily.   GENTEAL 0.25-0.3 % GEL Apply to eye daily.   HYDROcodone-acetaminophen (NORCO/VICODIN) 5-325 MG tablet every 6 (six) hours as needed for moderate pain or severe pain.   olmesartan (BENICAR) 20 MG tablet Take 10 mg by mouth daily. Take one half (0.5) tablet by mouth (10 mg) daily.   SYSTANE 0.4-0.3 % SOLN Apply to eye as needed.   triamcinolone (NASACORT) 55 MCG/ACT AERO nasal inhaler Place 2 sprays into the nose daily.   XARELTO 20 MG TABS tablet Take 20 mg by mouth daily.     Allergies:   Bystolic [nebivolol hcl], Lexiscan [regadenoson], Triamcinolone acetonide acetate [triamcinolone], Zetia [ezetimibe], Amlodipine, Hydromorphone, Apixaban, Lipitor [atorvastatin], Lisinopril, Neosporin [neomycin-bacitracin zn-polymyx], Pravastatin, and Toprol xl [metoprolol tartrate]   Social History   Socioeconomic History   Marital status: Widowed    Spouse name: Not  on file   Number of children: Not on file   Years of education: post grad   Highest education level: Not on file  Occupational History   Occupation: [employer]  Tobacco Use   Smoking status: Former    Types: Cigarettes    Quit date: 03/26/1969    Years since quitting: 53.8   Smokeless tobacco: Never  Substance and Sexual Activity   Alcohol use: Yes    Alcohol/week: 0.0 standard drinks of alcohol    Comment: 2 per day   Drug use: No   Sexual activity: Not Currently  Other Topics Concern   Not on file  Social History Narrative   Patient drinks 1-2 cups of caffeine daily.   Patient is left handed.    Social Determinants of Health   Financial Resource Strain: Not on file  Food Insecurity: Not on file  Transportation Needs: Not on file  Physical Activity: Not on file  Stress: Not on file  Social Connections: Not on file     Family History: The patient's family history includes Alcohol abuse in his brother; Cancer in his father; Heart disease in his mother; Kidney failure in his brother.  ROS:   Please see the history of present illness.    + lightheadeness + presyncope/visual changes  All other systems reviewed and are negative.  Labs/Other Studies Reviewed:    The following studies were reviewed today:  10 day Cardiac monitor 11/16/22   Normal sinus rhythm with average heart rate 82 bpm.   9 episodes of supraventricular tachycardia with the longest lasting 8 beats at a relatively slow rate and 1 episode lasted 13 beats at 187 bpm.   PVC burden less than 1% with 1 instance of trigeminy   No atrial fibrillation    DVT 10/28/22  RIGHT:  - No evidence of common femoral vein obstruction.    LEFT:  - Findings consistent with acute deep vein thrombosis involving the left  peroneal veins.  - No cystic structure found in the popliteal fossa.  - Ultrasound characteristics of enlarged lymph nodes noted in the groin.     *See table(s) above for measurements and  observations.   Echo 10/12/22  1. Left ventricular ejection fraction, by estimation, is 60 to 65%. The  left ventricle has normal function. The left ventricle has no regional  wall motion abnormalities.  There is mild concentric left ventricular  hypertrophy. Left ventricular diastolic  parameters are consistent with Grade I diastolic dysfunction (impaired  relaxation).   2. Right ventricular systolic function is normal. The right ventricular  size is normal. There is normal pulmonary artery systolic pressure. The  estimated right ventricular systolic pressure is 27.7 mmHg.   3. The mitral valve is degenerative. Mild mitral valve regurgitation. No  evidence of mitral stenosis.   4. The aortic valve is tricuspid. There is severe calcifcation of the  aortic valve. There is severe thickening of the aortic valve. Aortic valve  regurgitation is mild. Moderate aortic valve stenosis. Aortic valve area,  by VTI measures 0.97 cm. Aortic  valve mean gradient measures 19.1 mmHg. Aortic valve Vmax measures 2.79  m/s.   5. The inferior vena cava is normal in size with greater than 50%  respiratory variability, suggesting right atrial pressure of 3 mmHg.   Comparison(s): Changes from prior study are noted. AS is now moderate.    Recent Labs: 11/30/2022: ALT 19; BUN 29; Creatinine 0.95; Hemoglobin 13.0; Platelet Count 186; Potassium 4.2; Sodium 139  Recent Lipid Panel    Component Value Date/Time   CHOL 144 09/06/2017 0801   TRIG 111 09/06/2017 0801   HDL 70 09/06/2017 0801   CHOLHDL 2.1 09/06/2017 0801   CHOLHDL 1.8 08/08/2016 0810   VLDL 16 08/08/2016 0810   LDLCALC 52 09/06/2017 0801     Risk Assessment/Calculations:      Physical Exam:    VS:  BP 113/69   Pulse 82   Ht '5\' 8"'$  (1.727 m)   Wt 183 lb 3.2 oz (83.1 kg)   SpO2 97%   BMI 27.86 kg/m     Wt Readings from Last 3 Encounters:  01/13/23 183 lb 3.2 oz (83.1 kg)  11/30/22 186 lb 1.6 oz (84.4 kg)  11/02/22 181 lb 9.6 oz  (82.4 kg)     GEN:  Well nourished, well developed in no acute distress HEENT: Normal NECK: No JVD; No carotid bruits CARDIAC: RRR. 3/6 systolic murmur bilateral upper sternal borders. No rubs, gallops RESPIRATORY:  Clear to auscultation without rales, wheezing or rhonchi  ABDOMEN: Soft, non-tender, non-distended MUSCULOSKELETAL:  No edema; No deformity. 2+ pedal pulses, equal bilaterally SKIN: Warm and dry NEUROLOGIC:  Alert and oriented x 3 PSYCHIATRIC:  Normal affect   EKG:  EKG is not ordered today.     Diagnoses:    1. Syncope and collapse   2. Positional lightheadedness   3. Aortic valve disorder   4. Essential hypertension   5. Palpitations   6. Coronary artery disease involving native coronary artery of native heart without angina pectoris   7. Hyperlipidemia LDL goal <70     Assessment and Plan:     Pre-syncope/Syncope: One episode of vasovagal syncope on 10/27/22 that occurred while eating dinner after taking a hydrocodone for severe LLE pain. Found to have large DVT on vascular ultrasound. Was wearing monitor at the time of the event for palpitations.  Monitor revealed brief episodes of SVT and 1 episode of NSVT lasting 6 beats.  Advised to start metoprolol 25 mg daily, however he did not start BB due to lightheadedness. Additionally, has episodes of visual changes which occur separately from feelings of lightheadedness. Orthostatic vital signs done today with no significant change noted. Will reduce olmesartan to 10 mg daily and have him monitor home BP.  Will get carotid ultrasound to evaluate for stenosis that may be contributing to  symptoms.  Lightheadedness: Only occurs when he goes from sitting to standing and resolves after sitting for a few minutes. Will monitor BP and symptoms on lower dose of olmesartan. Will get carotid u/s to evaluate for stenosis.   DVT: Found to have acute DVT involving left peroneal veins by vascular ultrasound 10/28/2022. CTA negative for  PE on 11/20. Continues to have LLE pain, had an injection in his knee recently which has helped. Negative hypercoagulable work up with hematology. No bleeding problems on Xarelto. Management per PCP.   Palpitations: Quiescent at this time. Did not start beta blocker due to concerns about symptoms of lightheadedness and visual changes/presyncope. Will continue to monitor symptoms in the setting of soft BP and lightheadedness.  Aortic valve disease: Echo 10/12/2022 with moderate aortic valve stenosis with mean gradient 19.1 mmHg, AVA 0.97 cm2, mild aortic vlave regurgitation. No dyspnea, orthopnea, PND, edema, chest pain. Discussed symptoms of syncope/lightheadedness 2/2 to aortic valve stenosis. He does not have any other concerning symptoms to suggest worsening AS. Will continue to follow clinically for now. Plan for repeat echo for 1 year follow-up of valve disease.   CAD s/p CABG: History of CABG 2003. He denies chest pain, dyspnea, or other symptoms concerning for angina.  No indication for further ischemic evaluation at this time.   Hyperlipidemia LDL goal < 70: Apolipoprotein B 86, LDL 93 on 08/11/22. On Repatha. Did not specifically address today.   Hypertension: BP is well-controlled. He is not orthostatic. In the setting of lightheadedness and presyncope, we will reduce olmesartan to 10 mg (1/2 of 20 mg tablet) and have him monitor BP at home. Asked him to send BP and pulse readings in 2 weeks.    Disposition: 3 months with me  Medication Adjustments/Labs and Tests Ordered: Current medicines are reviewed at length with the patient today.  Concerns regarding medicines are outlined above.  No orders of the defined types were placed in this encounter.  No orders of the defined types were placed in this encounter.   Patient Instructions  Medication Instructions:   DECREASE Olmesartan one half (0.5) tablet by mouth (10 mg) daily.    *If you need a refill on your cardiac medications  before your next appointment, please call your pharmacy*   Lab Work:  None ordered.  If you have labs (blood work) drawn today and your tests are completely normal, you will receive your results only by: [place] (if you have MyChart) OR A paper copy in the mail If you have any lab test that is abnormal or we need to change your treatment, we will call you to review the results.   Testing/Procedures:  None ordered.   Follow-Up: At [facility], you and your health needs are our priority.  As part of our continuing mission to provide you with exceptional heart care, we have created designated Provider Care Teams.  These Care Teams include your primary Cardiologist (physician) and Advanced Practice Providers (APPs -  Physician Assistants and Nurse Practitioners) who all work together to provide you with the care you need, when you need it.  We recommend signing up for the patient portal called "MyChart".  Sign up information is provided on this After Visit Summary.  MyChart is used to connect with patients for Virtual Visits (Telemedicine).  Patients are able to view lab/test results, encounter notes, upcoming appointments, etc.  Non-urgent messages can be sent to your provider as well.   To learn more about what you can  do with MyChart, go to [email or web address].    Your next appointment:   3 month(s)  Provider:   Christen Bame, NP            Signed, Emmaline Life, NP  01/13/2023 1:23 PM    [place]

## 2023-01-13 ENCOUNTER — Ambulatory Visit: Payer: Medicare Other | Attending: Nurse Practitioner | Admitting: Nurse Practitioner

## 2023-01-13 ENCOUNTER — Other Ambulatory Visit: Payer: Self-pay | Admitting: *Deleted

## 2023-01-13 ENCOUNTER — Telehealth: Payer: Self-pay | Admitting: *Deleted

## 2023-01-13 ENCOUNTER — Encounter: Payer: Self-pay | Admitting: Nurse Practitioner

## 2023-01-13 VITALS — BP 113/69 | HR 82 | Ht 68.0 in | Wt 183.2 lb

## 2023-01-13 DIAGNOSIS — R55 Syncope and collapse: Secondary | ICD-10-CM | POA: Diagnosis not present

## 2023-01-13 DIAGNOSIS — I10 Essential (primary) hypertension: Secondary | ICD-10-CM | POA: Diagnosis not present

## 2023-01-13 DIAGNOSIS — R42 Dizziness and giddiness: Secondary | ICD-10-CM | POA: Insufficient documentation

## 2023-01-13 DIAGNOSIS — E785 Hyperlipidemia, unspecified: Secondary | ICD-10-CM | POA: Diagnosis not present

## 2023-01-13 DIAGNOSIS — R002 Palpitations: Secondary | ICD-10-CM | POA: Insufficient documentation

## 2023-01-13 DIAGNOSIS — I359 Nonrheumatic aortic valve disorder, unspecified: Secondary | ICD-10-CM | POA: Diagnosis not present

## 2023-01-13 DIAGNOSIS — I2510 Atherosclerotic heart disease of native coronary artery without angina pectoris: Secondary | ICD-10-CM | POA: Diagnosis not present

## 2023-01-13 DIAGNOSIS — H539 Unspecified visual disturbance: Secondary | ICD-10-CM

## 2023-01-13 NOTE — Telephone Encounter (Signed)
Lvm for pt with a tentative date for a carotid US due to visual disturbances. Order placed in system and linked. Sent to precert and scheduled. Just need pt to call back to confirm date and time.

## 2023-01-13 NOTE — Telephone Encounter (Signed)
Pt calling back to get time for carotid that was scheduled. Time and date Ok with pt. Sent to precert.

## 2023-01-13 NOTE — Patient Instructions (Signed)
Medication Instructions:   DECREASE Olmesartan one half (0.5) tablet by mouth (10 mg) daily.    *If you need a refill on your cardiac medications before your next appointment, please call your pharmacy*   Lab Work:  None ordered.  If you have labs (blood work) drawn today and your tests are completely normal, you will receive your results only by: [place] (if you have MyChart) OR A paper copy in the mail If you have any lab test that is abnormal or we need to change your treatment, we will call you to review the results.   Testing/Procedures:  None ordered.   Follow-Up: At [facility], you and your health needs are our priority.  As part of our continuing mission to provide you with exceptional heart care, we have created designated Provider Care Teams.  These Care Teams include your primary Cardiologist (physician) and Advanced Practice Providers (APPs -  Physician Assistants and Nurse Practitioners) who all work together to provide you with the care you need, when you need it.  We recommend signing up for the patient portal called "MyChart".  Sign up information is provided on this After Visit Summary.  MyChart is used to connect with patients for Virtual Visits (Telemedicine).  Patients are able to view lab/test results, encounter notes, upcoming appointments, etc.  Non-urgent messages can be sent to your provider as well.   To learn more about what you can do with MyChart, go to [email or web address].    Your next appointment:   3 month(s)  Provider:   Christen Bame, NP

## 2023-01-18 DIAGNOSIS — M5416 Radiculopathy, lumbar region: Secondary | ICD-10-CM | POA: Diagnosis not present

## 2023-01-23 ENCOUNTER — Ambulatory Visit (HOSPITAL_COMMUNITY)
Admission: RE | Admit: 2023-01-23 | Discharge: 2023-01-23 | Disposition: A | Discharge status: Home / Self Care | Payer: Medicare Other | Source: Ambulatory Visit | Attending: Cardiology | Admitting: Cardiology

## 2023-01-23 DIAGNOSIS — M6281 Muscle weakness (generalized): Secondary | ICD-10-CM | POA: Diagnosis not present

## 2023-01-23 DIAGNOSIS — H539 Unspecified visual disturbance: Secondary | ICD-10-CM | POA: Diagnosis not present

## 2023-01-23 DIAGNOSIS — R42 Dizziness and giddiness: Secondary | ICD-10-CM

## 2023-01-25 DIAGNOSIS — M6281 Muscle weakness (generalized): Secondary | ICD-10-CM | POA: Diagnosis not present

## 2023-01-26 DIAGNOSIS — M4316 Spondylolisthesis, lumbar region: Secondary | ICD-10-CM | POA: Diagnosis not present

## 2023-01-30 DIAGNOSIS — M5416 Radiculopathy, lumbar region: Secondary | ICD-10-CM | POA: Diagnosis not present

## 2023-02-01 DIAGNOSIS — M6281 Muscle weakness (generalized): Secondary | ICD-10-CM | POA: Diagnosis not present

## 2023-02-09 DIAGNOSIS — M6281 Muscle weakness (generalized): Secondary | ICD-10-CM | POA: Diagnosis not present

## 2023-02-13 DIAGNOSIS — M5416 Radiculopathy, lumbar region: Secondary | ICD-10-CM | POA: Diagnosis not present

## 2023-02-14 DIAGNOSIS — M79605 Pain in left leg: Secondary | ICD-10-CM | POA: Diagnosis not present

## 2023-02-14 DIAGNOSIS — M25562 Pain in left knee: Secondary | ICD-10-CM | POA: Diagnosis not present

## 2023-02-15 DIAGNOSIS — M6281 Muscle weakness (generalized): Secondary | ICD-10-CM | POA: Diagnosis not present

## 2023-02-18 DIAGNOSIS — M79605 Pain in left leg: Secondary | ICD-10-CM | POA: Diagnosis not present

## 2023-02-20 DIAGNOSIS — M6281 Muscle weakness (generalized): Secondary | ICD-10-CM | POA: Diagnosis not present

## 2023-02-21 DIAGNOSIS — M25562 Pain in left knee: Secondary | ICD-10-CM | POA: Diagnosis not present

## 2023-02-23 DIAGNOSIS — M25562 Pain in left knee: Secondary | ICD-10-CM | POA: Diagnosis not present

## 2023-02-27 DIAGNOSIS — M5416 Radiculopathy, lumbar region: Secondary | ICD-10-CM | POA: Diagnosis not present

## 2023-03-02 ENCOUNTER — Other Ambulatory Visit (HOSPITAL_COMMUNITY): Payer: Self-pay | Admitting: Sports Medicine

## 2023-03-02 ENCOUNTER — Ambulatory Visit (HOSPITAL_COMMUNITY)
Admission: RE | Admit: 2023-03-02 | Discharge: 2023-03-02 | Disposition: A | Discharge status: Home / Self Care | Payer: Medicare Other | Source: Ambulatory Visit | Attending: Cardiology | Admitting: Cardiology

## 2023-03-02 DIAGNOSIS — M79662 Pain in left lower leg: Secondary | ICD-10-CM

## 2023-03-02 DIAGNOSIS — M7989 Other specified soft tissue disorders: Secondary | ICD-10-CM

## 2023-03-02 DIAGNOSIS — M25562 Pain in left knee: Secondary | ICD-10-CM | POA: Diagnosis not present

## 2023-03-02 DIAGNOSIS — M79604 Pain in right leg: Secondary | ICD-10-CM | POA: Diagnosis not present

## 2023-03-02 DIAGNOSIS — M79605 Pain in left leg: Secondary | ICD-10-CM | POA: Diagnosis not present

## 2023-03-02 DIAGNOSIS — M79661 Pain in right lower leg: Secondary | ICD-10-CM | POA: Diagnosis not present

## 2023-03-06 DIAGNOSIS — M79605 Pain in left leg: Secondary | ICD-10-CM | POA: Diagnosis not present

## 2023-03-16 ENCOUNTER — Other Ambulatory Visit (HOSPITAL_BASED_OUTPATIENT_CLINIC_OR_DEPARTMENT_OTHER): Payer: Self-pay

## 2023-03-16 DIAGNOSIS — M170 Bilateral primary osteoarthritis of knee: Secondary | ICD-10-CM | POA: Diagnosis not present

## 2023-03-16 DIAGNOSIS — M25562 Pain in left knee: Secondary | ICD-10-CM | POA: Diagnosis not present

## 2023-03-16 MED ORDER — FLURAZEPAM HCL 30 MG PO CAPS
30.0000 mg | ORAL_CAPSULE | Freq: Every evening | ORAL | 1 refills | Status: DC
  Filled 2023-03-16: qty 30, 30d supply, fill #0
  Filled 2023-05-22: qty 30, 30d supply, fill #1
  Filled 2023-09-12: qty 30, 30d supply, fill #2

## 2023-03-17 ENCOUNTER — Other Ambulatory Visit (HOSPITAL_BASED_OUTPATIENT_CLINIC_OR_DEPARTMENT_OTHER): Payer: Self-pay

## 2023-04-04 ENCOUNTER — Telehealth: Payer: Self-pay | Admitting: Hematology and Oncology

## 2023-04-13 ENCOUNTER — Ambulatory Visit: Payer: Medicare Other | Admitting: Nurse Practitioner

## 2023-04-17 NOTE — Progress Notes (Unsigned)
Cardiology Office Note:    Date:  04/18/2023   ID:  HADDON LANZO, DOB 09-27-1946, MRN [id number]  PCP:  Cleatis Polka., MD   [facility] Providers Cardiologist:  Lesleigh Noe, MD (Inactive)     Referring MD: Cleatis Polka., MD   Chief Complaint: hypertension, aortic stenosis  History of Present Illness:    Nathan [name] is a pleasant 77 y.o. male with a hx of CAD s/p CABG 2003, HTN, HLD,DVT, and moderate AS.   Referred back to cardiology by PCP for evaluation of murmur and seen by Dr. Katrinka Blazing on 10/17/2019. History of chest burning that improved on proton pump inhibitor.  Seen in clinic on 10/17/2022 by Dr. Katrinka Blazing. He reported for the past 2 weeks he awakened from sleep with fluttering and palpitations in his chest.  He had multiple episodes over a year ago and we could never identify the etiology. Developed DVT LLE after long flight from [place].  Developed hives on Eliquis and ultimately treated with Xarelto for 5 months then stopped.  2D echo 10/12/22 revealed LVEF 60 to 65%, no RWMA, mild LVH, G1 DD, normal RV, mild MR, moderate aortic valve stenosis with mean gradient 19.1 mmHg and AVA 0.97 cm2. Zio monitor ordered for 14 days. Advised to return in 1 year with echo prior (will transfer from Dr. Katrinka Blazing to Dr. Izora Ribas).   Venous ultrasound 10/28/2022 positive for left DVT. CTA 10/31/22 negative for acute PE.   Seen by me in cardiology clinic on 11/01/22. Reported that he was recently walking at [facility] on 11/15, where he usually walks 2 1/2 miles. Was feeling well until he developed severe LLE pain from the groin down to the ankle. He thought he could walk it out but it never got better. The following day, was going out to dinner and took a hydromorphone for pain prior to leaving. After eating dinner, he became very diaphoretic then next thing he was aware of was being surrounded by a doctor and a nurse. He had slumped forward, passed out and vomited. He received  reassurance from the medical providers at the scene and also talked with PCP the evening who advised him to follow-up as an outpatient.  He continued to vomit but had no additional symptoms. No presyncope, syncope since that time. Is currently wearing a Zio monitor for palpitations that always occurr when waking up in the mornings. Symptoms do not last very long and are not associated with chest pain, shortness of breath, or diaphoresis. Due to the size of the DVT, felt it developed over the past year. Recalls that his legs were swollen one year ago when in [place].  At that visit, he was instructed to discontinue monitor and return if for evaluation.  No additional changes to treatment plan.  Cardiac monitor revealed underlying rhythm NSR with average HR 82 bpm, one run of NSVT lasting 6 beats, 9 episodes of SVT with longest lasting 13 beats, PVC burden less than 1%, no atrial fibrillation. He was advised to start metoprolol succinate 25 mg daily, however he wished to hold off on starting medication.  He contacted our office 01/06/2023 with reports of increased frequency of lightheadedness. Reported 5 "white outs" where he feels his vision coming in like a fog which last for about 10 minutes. Feels the need to sit down when this happens. Has not been able to check BP during any of the spells. Was advised by our pharmacist that [facility]  and gabapentin could be contributing to dizziness. He reported he was told by PCP to see cardiology.  Seen in clinic by me on 01/13/23 for evaluation of of lightheadedness, occurring periodically. Lightheadedness is different than episodes he describes as "white outs." The "white out" episodes have occurred at times when he was going into a building from outside, and on another occasion when he was driving. States it feels like everything is going white, like he is driving into fog. Had extensive eye exam and has seen a retina specialist. 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 Lightheadedness only occurs when going from sitting to standing. If he sits for a few seconds and stands up again, he does not feel lightheaded. Saw PCP who suggested decreasing olmesartan to half a tablet. He has not been able to monitor his BP at time symptoms occur. Evaluated by hematology with no significant finding to explain blood clot. He takes gabapentin for back problems. Has been referred to PT. Does not feel that symptoms worsened when he started any of his medications and symptoms do not intensify after taking medications. Thinks he stays well hydrated, urine is fairly pale yellow. Has 1 or 2 alcohol drinks per night. No palpitations since the one episode in November. No chest pain, edema, orthopnea, PND, melena, syncope.  He did not start metoprolol which was recommended for episodes of SVT noted on cardiac monitor due to concerns for lightheadedness.  Orthostatic vital signs did not reveal significant change.  He was encouraged to reduce olmesartan to 10 mg daily and monitor home BP.  Carotid duplex completed 01/23/2023 revealed minimal plaque bilaterally 1 to 39%, normal subclavian blood flow.   Today, he returns for follow-up. Reports BP has been well-controlled since reducing olmesartan to 10 mg daily. He has had no further episodes of palpitations since changing the olmesartan dose. No specific cardiac concerns today. No further episodes of lightheadedness, presyncope, syncope on lower dose of olmesartan. Unfortunately, he continues to have issues with his left leg. Had LLE baker's cyst that ruptured 03/02/23 and he continues to be limited physically. Seen by Dr. Despina Hick and advised to walk 1 mile every other day. Pain was initially severe but continues to improve. It has limited him from his regular activities and he has a trip to [place] scheduled for the second week of June. Missed a special ceremony at [place] where he was to be honored due to recent COVID infection, will be  awarded next year. Advised to continue Xarelto by hematologist. Has trip to [place] scheduled for 2nd week of June, frustrated that he may not be able to go. He denies chest pain, shortness of breath, palpitations, melena, hematuria, hemoptysis, diaphoresis, weakness, orthopnea, and PND.   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    Blood transfusion without reported diagnosis    Complication of anesthesia    jittery after surgery   Coronary artery disease    GERD (gastroesophageal reflux disease)    History of DVT (deep vein thrombosis)    Hyperlipemia    Hypertension    IFG (impaired fasting glucose)    Right carpal tunnel syndrome 02/08/2017   Seasonal allergies    Wears glasses     Past Surgical History:  Procedure Laterality Date   APPENDECTOMY     age 10   COLONOSCOPY     COLONOSCOPY WITH PROPOFOL N/A 02/15/2016  Procedure: COLONOSCOPY WITH PROPOFOL;  Surgeon: Charolett Bumpers, MD;  Location: [facility];  Service: Endoscopy;  Laterality: N/A;   CORONARY ARTERY BYPASS GRAFT     2003   ESOPHAGOGASTRODUODENOSCOPY (EGD) WITH PROPOFOL N/A 02/15/2016   Procedure: ESOPHAGOGASTRODUODENOSCOPY (EGD) WITH PROPOFOL;  Surgeon: Charolett Bumpers, MD;  Location: [facility];  Service: Endoscopy;  Laterality: N/A;   EXPLORATION POST OPERATIVE OPEN HEART     EYE SURGERY     detached retina-lt   INGUINAL HERNIA REPAIR Left 04/01/2014   Procedure: HERNIA REPAIR INGUINAL ADULT;  Surgeon: Kandis Cocking, MD;  Location: [facility];  Service: General;  Laterality: Left;   INSERTION OF MESH Left 04/01/2014   Procedure: INSERTION OF MESH;  Surgeon: Kandis Cocking, MD;  Location: [facility];  Service: General;  Laterality: Left;   KNEE ARTHROCENTESIS  1980   right   SHOULDER ACROMIOPLASTY  1985   rt   SINUS ENDO W/FUSION     TONSILLECTOMY AND ADENOIDECTOMY      Current  Medications: Current Meds  Medication Sig   ammonium lactate (AMLACTIN) 12 % lotion Apply 1 Application topically as needed for dry skin.   cetirizine (ZYRTEC) 10 MG tablet Take 10 mg by mouth daily.   cycloSPORINE (RESTASIS) 0.05 % ophthalmic emulsion Place 1 drop into both eyes 2 (two) times daily.    Evolocumab (REPATHA SURECLICK) 140 MG/ML SOAJ INJECT 1 PEN UNDER THE SKIN (SUBCUTANEOUS INJECTION) EVERY 14 DAYS.   flurazepam (DALMANE) 30 MG capsule Take 1 capsule (30 mg total) by mouth at bedtime.   GENTEAL 0.25-0.3 % GEL Apply to eye daily.   olmesartan (BENICAR) 20 MG tablet Take 10 mg by mouth daily. Take one half (0.5) tablet by mouth (10 mg) daily.   SYSTANE 0.4-0.3 % SOLN Apply to eye as needed.   triamcinolone (NASACORT) 55 MCG/ACT AERO nasal inhaler Place 2 sprays into the nose daily.   XARELTO 20 MG TABS tablet Take 20 mg by mouth daily.     Allergies:   Bystolic [nebivolol hcl], Lexiscan [regadenoson], Triamcinolone acetonide acetate [triamcinolone], Zetia [ezetimibe], Amlodipine, Hydromorphone, Apixaban, Lipitor [atorvastatin], Lisinopril, Neosporin [neomycin-bacitracin zn-polymyx], Pravastatin, and Toprol xl [metoprolol tartrate]   Social History   Socioeconomic History   Marital status: Widowed    Spouse name: Not on file   Number of children: Not on file   Years of education: post grad   Highest education level: Not on file  Occupational History   Occupation: [employer]  Tobacco Use   Smoking status: Former    Types: Cigarettes    Quit date: 03/26/1969    Years since quitting: 54.0   Smokeless tobacco: Never  Substance and Sexual Activity   Alcohol use: Yes    Alcohol/week: 0.0 standard drinks of alcohol    Comment: 2 per day   Drug use: No   Sexual activity: Not Currently  Other Topics Concern   Not on file  Social History Narrative   Patient drinks 1-2 cups of caffeine daily.   Patient is left handed.    Social Determinants of Health   Financial Resource  Strain: Not on file  Food Insecurity: Not on file  Transportation Needs: Not on file  Physical Activity: Not on file  Stress: Not on file  Social Connections: Not on file     Family History: The patient's family history includes Alcohol abuse in his brother; Cancer in his father; Heart disease in his mother; Kidney failure in his brother.  ROS:   Please see the history of present illness.    + LLE swelling and pain All other systems reviewed and are negative.  Labs/Other Studies Reviewed:    The following studies were reviewed today:  LE Venous Duplex (DVT) 03/02/23 Summary:  RIGHT:  - No evidence of common femoral vein obstruction.    LEFT:  - No evidence of deep vein thrombosis in the lower extremity. No indirect  evidence of obstruction proximal to the inguinal ligament.     *See table(s) above for measurements and observations. Carotid Duplex 01/23/23 Summary:  Right Carotid: Velocities in the right ICA are consistent with a 1-39%  stenosis.   Left Carotid: Velocities in the left ICA are consistent with a 1-39%  stenosis.   Vertebrals: Bilateral vertebral arteries demonstrate antegrade flow.  Subclavians: Normal flow hemodynamics were seen in bilateral subclavian               arteries.   *See table(s) above for measurements and observations.  10 day Cardiac monitor 11/16/22   Normal sinus rhythm with average heart rate 82 bpm.   9 episodes of supraventricular tachycardia with the longest lasting 8 beats at a relatively slow rate and 1 episode lasted 13 beats at 187 bpm.   PVC burden less than 1% with 1 instance of trigeminy   No atrial fibrillation    DVT 10/28/22  RIGHT:  - No evidence of common femoral vein obstruction.    LEFT:  - Findings consistent with acute deep vein thrombosis involving the left  peroneal veins.  - No cystic structure found in the popliteal fossa.  - Ultrasound characteristics of enlarged lymph nodes noted in the groin.      *See table(s) above for measurements and observations.   Echo 10/12/22  1. Left ventricular ejection fraction, by estimation, is 60 to 65%. The  left ventricle has normal function. The left ventricle has no regional  wall motion abnormalities. There is mild concentric left ventricular  hypertrophy. Left ventricular diastolic  parameters are consistent with Grade I diastolic dysfunction (impaired  relaxation).   2. Right ventricular systolic function is normal. The right ventricular  size is normal. There is normal pulmonary artery systolic pressure. The  estimated right ventricular systolic pressure is 25.3 mmHg.   3. The mitral valve is degenerative. Mild mitral valve regurgitation. No  evidence of mitral stenosis.   4. The aortic valve is tricuspid. There is severe calcifcation of the  aortic valve. There is severe thickening of the aortic valve. Aortic valve  regurgitation is mild. Moderate aortic valve stenosis. Aortic valve area,  by VTI measures 0.97 cm. Aortic  valve mean gradient measures 19.1 mmHg. Aortic valve Vmax measures 2.79  m/s.   5. The inferior vena cava is normal in size with greater than 50%  respiratory variability, suggesting right atrial pressure of 3 mmHg.   Comparison(s): Changes from prior study are noted. AS is now moderate.    Recent Labs: 11/30/2022: ALT 19; BUN 29; Creatinine 0.95; Hemoglobin 13.0; Platelet Count 186; Potassium 4.2; Sodium 139  Recent Lipid Panel    Component Value Date/Time   CHOL 144 09/06/2017 0801   TRIG 111 09/06/2017 0801   HDL 70 09/06/2017 0801   CHOLHDL 2.1 09/06/2017 0801   CHOLHDL 1.8 08/08/2016 0810   VLDL 16 08/08/2016 0810   LDLCALC 52 09/06/2017 0801     Risk Assessment/Calculations:      Physical Exam:    VS:  BP 138/62  Pulse 87   Ht 5\' 8"  (1.727 m)   Wt 184 lb 3.2 oz (83.6 kg)   SpO2 98%   BMI 28.01 kg/m     Wt Readings from Last 3 Encounters:  04/18/23 184 lb 3.2 oz (83.6 kg)  01/13/23 183  lb 3.2 oz (83.1 kg)  11/30/22 186 lb 1.6 oz (84.4 kg)     GEN:  Well nourished, well developed in no acute distress HEENT: Normal NECK: No JVD; No carotid bruits CARDIAC: RRR. 3/6 systolic murmur bilateral upper sternal borders. No rubs, gallops RESPIRATORY:  Clear to auscultation without rales, wheezing or rhonchi  ABDOMEN: Soft, non-tender, non-distended MUSCULOSKELETAL:  LLE swelling around knee, no deformity. 2+ pedal pulses, equal bilaterally SKIN: Warm and dry NEUROLOGIC:  Alert and oriented x 3 PSYCHIATRIC:  Normal affect   EKG:  EKG is not ordered today   Diagnoses:    1. Nonrheumatic aortic valve stenosis   2. Essential hypertension   3. Hyperlipidemia LDL goal <70   4. Syncope and collapse   5. Coronary artery disease involving coronary bypass graft of native heart without angina pectoris   6. Left leg pain   7. History of DVT (deep vein thrombosis)   8. Hx of CABG   9. Palpitations      Assessment and Plan:     Hypertension: Feeling better on lower dose of olmesartan. BP is well-controlled. Will get CMET to update renal function and electrolytes today.  Pre-syncope/Syncope: Singe episode of vasovagal syncope on 10/27/22 that occurred while eating dinner after taking a hydrocodone for severe LLE pain. Found to have large DVT on vascular ultrasound. Was wearing monitor at the time of the event for palpitations. Monitor revealed brief episodes of SVT and 1 episode of NSVT lasting 6 beats.  Advised to start metoprolol 25 mg daily, however he did not start BB due to lightheadedness. Carotid ultrasound revealed no concerning stenosis. No further episodes of lightheadedness, presyncope since reducing dose of olmesartan to 10 mg daily.  Leg pain/Hx DVT:  DVT involving left peroneal veins by vascular ultrasound 10/28/2022. CTA negative for PE on 11/20. Ruptured Baker's cyst LLE a few weeks ago. Negative hypercoagulable work up with hematology. No bleeding problems on Xarelto.  Advised to remain on Xarelto for now. Frustrated by limited mobility, no acute concerns today.   Palpitations: Quiescent at this time. No problems recently. He did not try beta blocker and symptoms have improved.   Aortic valve disease: Moderate aortic valve stenosis with mean gradient 19.1 mmHg, AVA 0.97 cm2, mild aortic vlave regurgitation on echo 10/12/22. He denies dyspnea, orthopnea, PND, edema, chest pain. Will plan for repeat echo in 6 months unless clinically indicated prior.   CAD s/p CABG: s/p CABG 2003. He denies chest pain, dyspnea, or other symptoms concerning for angina.  No indication for further ischemic evaluation at this time.   Hyperlipidemia LDL goal < 70: LDL 66 on 11/01/22. Tolerating Repatha well.  We will rene with no s/e. We will renew his prior authorization. Will update lipid panel today.   Disposition: 6 months with Dr. Izora Ribas with echo prior   Medication Adjustments/Labs and Tests Ordered: Current medicines are reviewed at length with the patient today.  Concerns regarding medicines are outlined above.  Orders Placed This Encounter  Procedures   Lipid panel   Comprehensive metabolic panel   ECHOCARDIOGRAM COMPLETE   No orders of the defined types were placed in this encounter.   Patient Instructions  Medication Instructions:  Your physician recommends that you continue on your current medications as directed. Please refer to the Current Medication list given to you today.  *If you need a refill on your cardiac medications before your next appointment, please call your pharmacy*  Lab Work: TODAY: Lipids, CMET If you have labs (blood work) drawn today and your tests are completely normal, you will receive your results only by: MyChart Message (if you have MyChart) OR A paper copy in the mail If you have any lab test that is abnormal or we need to change your treatment, we will call you to review the results.  Testing/Procedures: Your physician  has requested that you have an echocardiogram in 6 months. Echocardiography is a painless test that uses sound waves to create images of your heart. It provides your doctor with information about the size and shape of your heart and how well your heart's chambers and valves are working. This procedure takes approximately one hour. There are no restrictions for this procedure. Please do NOT wear cologne, perfume, aftershave, or lotions (deodorant is allowed). Please arrive 15 minutes prior to your appointment time.  Follow-Up: At [facility], you and your health needs are our priority.  As part of our continuing mission to provide you with exceptional heart care, we have created designated Provider Care Teams.  These Care Teams include your primary Cardiologist (physician) and Advanced Practice Providers (APPs -  Physician Assistants and Nurse Practitioners) who all work together to provide you with the care you need, when you need it.  Your next appointment:   6 month(s) (after echo)  Provider:   Dr. Izora Ribas    Signed, Anuel Sitter, Zachary George, NP  04/18/2023 11:59 AM     HeartCare

## 2023-04-18 ENCOUNTER — Encounter: Payer: Self-pay | Admitting: Nurse Practitioner

## 2023-04-18 ENCOUNTER — Ambulatory Visit: Payer: Medicare Other | Attending: Nurse Practitioner | Admitting: Nurse Practitioner

## 2023-04-18 VITALS — BP 138/62 | HR 87 | Ht 68.0 in | Wt 184.2 lb

## 2023-04-18 DIAGNOSIS — E785 Hyperlipidemia, unspecified: Secondary | ICD-10-CM | POA: Insufficient documentation

## 2023-04-18 DIAGNOSIS — Z951 Presence of aortocoronary bypass graft: Secondary | ICD-10-CM | POA: Insufficient documentation

## 2023-04-18 DIAGNOSIS — I25810 Atherosclerosis of coronary artery bypass graft(s) without angina pectoris: Secondary | ICD-10-CM | POA: Insufficient documentation

## 2023-04-18 DIAGNOSIS — R55 Syncope and collapse: Secondary | ICD-10-CM | POA: Diagnosis not present

## 2023-04-18 DIAGNOSIS — Z86718 Personal history of other venous thrombosis and embolism: Secondary | ICD-10-CM | POA: Diagnosis not present

## 2023-04-18 DIAGNOSIS — R002 Palpitations: Secondary | ICD-10-CM | POA: Insufficient documentation

## 2023-04-18 DIAGNOSIS — I350 Nonrheumatic aortic (valve) stenosis: Secondary | ICD-10-CM | POA: Diagnosis not present

## 2023-04-18 DIAGNOSIS — I10 Essential (primary) hypertension: Secondary | ICD-10-CM | POA: Diagnosis not present

## 2023-04-18 DIAGNOSIS — M79605 Pain in left leg: Secondary | ICD-10-CM | POA: Diagnosis not present

## 2023-04-18 DIAGNOSIS — I359 Nonrheumatic aortic valve disorder, unspecified: Secondary | ICD-10-CM | POA: Diagnosis not present

## 2023-04-18 LAB — COMPREHENSIVE METABOLIC PANEL
ALT: 26 IU/L (ref 0–44)
AST: 22 IU/L (ref 0–40)
Albumin/Globulin Ratio: 2 (ref 1.2–2.2)
Albumin: 4.3 g/dL (ref 3.8–4.8)
Alkaline Phosphatase: 47 IU/L (ref 44–121)
BUN/Creatinine Ratio: 16 (ref 10–24)
BUN: 15 mg/dL (ref 8–27)
Bilirubin Total: 0.3 mg/dL (ref 0.0–1.2)
CO2: 22 mmol/L (ref 20–29)
Calcium: 9.3 mg/dL (ref 8.6–10.2)
Chloride: 101 mmol/L (ref 96–106)
Creatinine, Ser: 0.95 mg/dL (ref 0.76–1.27)
Globulin, Total: 2.2 g/dL (ref 1.5–4.5)
Glucose: 107 mg/dL — ABNORMAL HIGH (ref 70–99)
Potassium: 4.3 mmol/L (ref 3.5–5.2)
Sodium: 136 mmol/L (ref 134–144)
Total Protein: 6.5 g/dL (ref 6.0–8.5)
eGFR: 82 mL/min/{1.73_m2} (ref >59)

## 2023-04-18 LAB — LIPID PANEL
Chol/HDL Ratio: 2.5 ratio (ref 0.0–5.0)
Cholesterol, Total: 170 mg/dL (ref 100–199)
HDL: 68 mg/dL (ref >39)
LDL Chol Calc (NIH): 80 mg/dL (ref 0–99)
Triglycerides: 130 mg/dL (ref 0–149)
VLDL Cholesterol Cal: 22 mg/dL (ref 5–40)

## 2023-04-18 NOTE — Patient Instructions (Signed)
Medication Instructions:  Your physician recommends that you continue on your current medications as directed. Please refer to the Current Medication list given to you today.  *If you need a refill on your cardiac medications before your next appointment, please call your pharmacy*  Lab Work: TODAY: Lipids, CMET If you have labs (blood work) drawn today and your tests are completely normal, you will receive your results only by: MyChart Message (if you have MyChart) OR A paper copy in the mail If you have any lab test that is abnormal or we need to change your treatment, we will call you to review the results.  Testing/Procedures: Your physician has requested that you have an echocardiogram in 6 months. Echocardiography is a painless test that uses sound waves to create images of your heart. It provides your doctor with information about the size and shape of your heart and how well your heart's chambers and valves are working. This procedure takes approximately one hour. There are no restrictions for this procedure. Please do NOT wear cologne, perfume, aftershave, or lotions (deodorant is allowed). Please arrive 15 minutes prior to your appointment time.  Follow-Up: At [facility], you and your health needs are our priority.  As part of our continuing mission to provide you with exceptional heart care, we have created designated Provider Care Teams.  These Care Teams include your primary Cardiologist (physician) and Advanced Practice Providers (APPs -  Physician Assistants and Nurse Practitioners) who all work together to provide you with the care you need, when you need it.  Your next appointment:   6 month(s) (after echo)  Provider:   Dr. Izora Ribas

## 2023-04-20 DIAGNOSIS — D3131 Benign neoplasm of right choroid: Secondary | ICD-10-CM | POA: Diagnosis not present

## 2023-04-20 DIAGNOSIS — Z961 Presence of intraocular lens: Secondary | ICD-10-CM | POA: Diagnosis not present

## 2023-04-20 DIAGNOSIS — H35373 Puckering of macula, bilateral: Secondary | ICD-10-CM | POA: Diagnosis not present

## 2023-04-20 DIAGNOSIS — M1712 Unilateral primary osteoarthritis, left knee: Secondary | ICD-10-CM | POA: Diagnosis not present

## 2023-04-20 DIAGNOSIS — H33312 Horseshoe tear of retina without detachment, left eye: Secondary | ICD-10-CM | POA: Diagnosis not present

## 2023-04-20 DIAGNOSIS — H43813 Vitreous degeneration, bilateral: Secondary | ICD-10-CM | POA: Diagnosis not present

## 2023-04-25 ENCOUNTER — Other Ambulatory Visit: Payer: Self-pay | Admitting: Hematology and Oncology

## 2023-04-25 DIAGNOSIS — I82462 Acute embolism and thrombosis of left calf muscular vein: Secondary | ICD-10-CM

## 2023-04-25 DIAGNOSIS — M1712 Unilateral primary osteoarthritis, left knee: Secondary | ICD-10-CM | POA: Diagnosis not present

## 2023-04-25 NOTE — Progress Notes (Unsigned)
[facility] Telephone:([phone]) [phone]   Fax:([phone]  PROGRESS NOTE  Patient Care Team: Cleatis Polka., MD as PCP - General (Internal Medicine) Lyn Records, MD (Inactive) as PCP - Cardiology (Cardiology) Ovidio Kin, MD as Consulting Physician (General Surgery)  Hematological/Oncological History # Left Lower Extremity DVT DVT 02/28/2022: Lower Extremity US showed acute deep vein thrombosis involving the left common femoral vein, SF junction, left femoral vein, left proximal profunda vein, left popliteal vein, left posterior tibial veins, and left peroneal veins. Provoked by 14 hour flight.   10/28/2022: Lower Extremity US showed acute DVT in left peroneal veins.  11/30/2022: establish care with Dr. Leonides Schanz   Interval History:  Nathan [name] 77 y.o. male with medical history significant for unprovoked LLE DVT who presents for a follow up visit. The patient's last visit was on 11/30/2022. In the interim since the last visit he has continued on Xarelto therapy.   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    Blood transfusion without reported diagnosis    Complication of anesthesia    jittery after surgery   Coronary artery disease    GERD (gastroesophageal reflux disease)    History of DVT (deep vein thrombosis)    Hyperlipemia    Hypertension    IFG (impaired fasting glucose)    Right carpal tunnel syndrome 02/08/2017   Seasonal allergies    Wears glasses     SURGICAL HISTORY: Past Surgical History:  Procedure Laterality Date   APPENDECTOMY     age 89   COLONOSCOPY     COLONOSCOPY WITH PROPOFOL N/A 02/15/2016   Procedure: COLONOSCOPY WITH PROPOFOL;  Surgeon: Charolett Bumpers, MD;  Location: [facility];  Service: Endoscopy;  Laterality: N/A;   CORONARY ARTERY BYPASS GRAFT     2003    ESOPHAGOGASTRODUODENOSCOPY (EGD) WITH PROPOFOL N/A 02/15/2016   Procedure: ESOPHAGOGASTRODUODENOSCOPY (EGD) WITH PROPOFOL;  Surgeon: Charolett Bumpers, MD;  Location: [facility];  Service: Endoscopy;  Laterality: N/A;   EXPLORATION POST OPERATIVE OPEN HEART     EYE SURGERY     detached retina-lt   INGUINAL HERNIA REPAIR Left 04/01/2014   Procedure: HERNIA REPAIR INGUINAL ADULT;  Surgeon: Kandis Cocking, MD;  Location: [facility];  Service: General;  Laterality: Left;   INSERTION OF MESH Left 04/01/2014   Procedure: INSERTION OF MESH;  Surgeon: Kandis Cocking, MD;  Location: [facility];  Service: General;  Laterality: Left;   KNEE ARTHROCENTESIS  1980   right   SHOULDER ACROMIOPLASTY  1985   rt   SINUS ENDO W/FUSION     TONSILLECTOMY AND ADENOIDECTOMY      SOCIAL HISTORY: Social History   Socioeconomic History   Marital status: Widowed    Spouse name: Not on file   Number of children: Not on file   Years of education: post grad   Highest education level: Not on file  Occupational History   Occupation: [employer]  Tobacco Use   Smoking status: Former    Types: Cigarettes    Quit date: 03/26/1969    Years since quitting: 54.1   Smokeless tobacco: Never  Substance and Sexual Activity   Alcohol use: Yes    Alcohol/week: 0.0 standard drinks of alcohol    Comment: 2 per day   Drug use: No  Sexual activity: Not Currently  Other Topics Concern   Not on file  Social History Narrative   Patient drinks 1-2 cups of caffeine daily.   Patient is left handed.    Social Determinants of Health   Financial Resource Strain: Not on file  Food Insecurity: Not on file  Transportation Needs: Not on file  Physical Activity: Not on file  Stress: Not on file  Social Connections: Not on file  Intimate Partner Violence: Not on file    FAMILY HISTORY: Family History  Problem Relation Age of Onset   Heart disease Mother    Cancer Father    Alcohol abuse Brother     Kidney failure Brother     ALLERGIES:  is allergic to [employer] hcl], lexiscan [regadenoson], triamcinolone acetonide acetate [triamcinolone], zetia [ezetimibe], amlodipine, hydromorphone, apixaban, lipitor [atorvastatin], lisinopril, neosporin [neomycin-bacitracin zn-polymyx], pravastatin, and toprol xl [metoprolol tartrate].  MEDICATIONS:  Current Outpatient Medications  Medication Sig Dispense Refill   ammonium lactate (AMLACTIN) 12 % lotion Apply 1 Application topically as needed for dry skin. 400 g 0   cetirizine (ZYRTEC) 10 MG tablet Take 10 mg by mouth daily.     cycloSPORINE (RESTASIS) 0.05 % ophthalmic emulsion Place 1 drop into both eyes 2 (two) times daily.      Evolocumab (REPATHA SURECLICK) 140 MG/ML SOAJ INJECT 1 PEN UNDER THE SKIN (SUBCUTANEOUS INJECTION) EVERY 14 DAYS. 2 mL 11   flurazepam (DALMANE) 30 MG capsule Take 1 capsule (30 mg total) by mouth at bedtime. 90 capsule 1   GENTEAL 0.25-0.3 % GEL Apply to eye daily.     olmesartan (BENICAR) 20 MG tablet Take 10 mg by mouth daily. Take one half (0.5) tablet by mouth (10 mg) daily.     SYSTANE 0.4-0.3 % SOLN Apply to eye as needed.     triamcinolone (NASACORT) 55 MCG/ACT AERO nasal inhaler Place 2 sprays into the nose daily.     XARELTO 20 MG TABS tablet Take 20 mg by mouth daily.     No current facility-administered medications for this visit.    REVIEW OF SYSTEMS:   Constitutional: ( - ) fevers, ( - )  chills , ( - ) night sweats Eyes: ( - ) blurriness of vision, ( - ) double vision, ( - ) watery eyes Ears, nose, mouth, throat, and face: ( - ) mucositis, ( - ) sore throat Respiratory: ( - ) cough, ( - ) dyspnea, ( - ) wheezes Cardiovascular: ( - ) palpitation, ( - ) chest discomfort, ( - ) lower extremity swelling Gastrointestinal:  ( - ) nausea, ( - ) heartburn, ( - ) change in bowel habits Skin: ( - ) abnormal skin rashes Lymphatics: ( - ) new lymphadenopathy, ( - ) easy bruising Neurological: ( - )  numbness, ( - ) tingling, ( - ) new weaknesses Behavioral/Psych: ( - ) mood change, ( - ) new changes  All other systems were reviewed with the patient and are negative.  PHYSICAL EXAMINATION: There were no vitals filed for this visit. There were no vitals filed for this visit.  GENERAL: well appearing elderly Caucasian male, alert, no distress and comfortable SKIN: skin color, texture, turgor are normal, no rashes or significant lesions EYES: conjunctiva are pink and non-injected, sclera clear LUNGS: clear to auscultation and percussion with normal breathing effort HEART: regular rate & rhythm and no murmurs and no lower extremity edema Musculoskeletal: no cyanosis of digits and no clubbing  PSYCH: alert & oriented  x 3, fluent speech NEURO: no focal motor/sensory deficits  LABORATORY DATA:  I have reviewed the data as listed    Latest Ref Rng & Units 11/30/2022   10:23 AM 11/09/2017    9:17 AM 09/14/2017    8:43 AM  CBC  WBC 4.0 - 10.5 K/uL 6.9  4.3  11.2   Hemoglobin 13.0 - 17.0 g/dL 16.1  09.6  04.5   Hematocrit 39.0 - 52.0 % 38.0  41.6  39.7   Platelets 150 - 400 K/uL 186  195  219        Latest Ref Rng & Units 04/18/2023   10:32 AM 11/30/2022   10:23 AM 11/23/2017    8:53 AM  CMP  Glucose 70 - 99 mg/dL 409  97  811   BUN 8 - 27 mg/dL 15  29  15    Creatinine 0.76 - 1.27 mg/dL 9.14  7.82  9.56   Sodium 134 - 144 mmol/L 136  139  142   Potassium 3.5 - 5.2 mmol/L 4.3  4.2  4.5   Chloride 96 - 106 mmol/L 101  105  104   CO2 20 - 29 mmol/L 22  31  27    Calcium 8.6 - 10.2 mg/dL 9.3  9.4  9.3   Total Protein 6.0 - 8.5 g/dL 6.5  6.7    Total Bilirubin 0.0 - 1.2 mg/dL 0.3  0.3    Alkaline Phos 44 - 121 IU/L 47  44    AST 0 - 40 IU/L 22  17    ALT 0 - 44 IU/L 26  19      RADIOGRAPHIC STUDIES: No results found.  ASSESSMENT & PLAN Nathan [name] 77 y.o. male with medical history significant for unprovoked LLE DVT who presents for a follow up visit.   After review of the  labs, review of the records, and discussion with the patient the patients findings are most consistent with recurrent DVTs, 1 provoked and 1 unprovoked.   A provoked venous thromboembolism (VTE) is one that has a clear inciting factor or event. Provoking factors include prolonged travel/immobility, surgery (particular abdominal or orthropedic), trauma,  and pregnancy/ estrogen containing birth control. After a detailed history and review of the records there is no clear provoking factor for this patient's VTE.  Patients with unprovoked VTEs have up to 25% recurrence after 5 years and 36% at 10 years, with 4% of these clots being fatal (BMJ?2019;366:l4363). Therefore the formal recommendation for unprovoked VTE's is lifelong anticoagulation, as the cause may not be transient or reversible. We recommend 6 months or full strength anticoagulation with a re-evaluation after that time.  The patient's will then have a choice of maintenance dose DOAC (preferred, recommended), 81mg  ASA PO daily (non-preferred), or no further anticoagulation (not recommended).     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  Lupus anticoagulant panel would be altered by presence of blood thinner, will hold on this testing.   --recommend the patient continue xarelto 20 mg PO daily *** --patient denies any bleeding, bruising, or dark stools on this medication. It is well tolerated. No difficulties accessing/affording the medication  --labs today show *** --RTC in 6 months' time with strict return precautions for overt signs of bleeding.   *** At that time we will discuss transition to maintenance dose Xarelto.  Patient is eager to  get off anticoagulation and would want to consider aspirin.  No orders of the defined types were placed in this encounter.   All questions were answered. The  patient knows to call the clinic with any problems, questions or concerns.  A total of more than 30 minutes were spent on this encounter with face-to-face time and non-face-to-face time, including preparing to see the patient, ordering tests and/or medications, counseling the patient and coordination of care as outlined above.   Ulysees Barns, MD Department of Hematology/Oncology [facility] at [facility] Phone: [phone] Pager: [phone] Email: [email or web address]  04/25/2023 9:10 PM

## 2023-04-26 ENCOUNTER — Inpatient Hospital Stay: Payer: Medicare Other | Attending: Hematology and Oncology

## 2023-04-26 ENCOUNTER — Inpatient Hospital Stay (HOSPITAL_BASED_OUTPATIENT_CLINIC_OR_DEPARTMENT_OTHER): Payer: Medicare Other | Admitting: Hematology and Oncology

## 2023-04-26 VITALS — BP 128/77 | HR 88 | Temp 98.1°F | Resp 14 | Wt 183.8 lb

## 2023-04-26 DIAGNOSIS — Z87891 Personal history of nicotine dependence: Secondary | ICD-10-CM | POA: Insufficient documentation

## 2023-04-26 DIAGNOSIS — I82462 Acute embolism and thrombosis of left calf muscular vein: Secondary | ICD-10-CM

## 2023-04-26 DIAGNOSIS — Z86718 Personal history of other venous thrombosis and embolism: Secondary | ICD-10-CM | POA: Diagnosis not present

## 2023-04-26 DIAGNOSIS — Z7901 Long term (current) use of anticoagulants: Secondary | ICD-10-CM | POA: Insufficient documentation

## 2023-04-26 LAB — CBC WITH DIFFERENTIAL (CANCER CENTER ONLY)
Abs Immature Granulocytes: 0.03 10*3/uL (ref 0.00–0.07)
Basophils Absolute: 0.1 10*3/uL (ref 0.0–0.1)
Basophils Relative: 1 %
Eosinophils Absolute: 0.2 10*3/uL (ref 0.0–0.5)
Eosinophils Relative: 4 %
HCT: 37.8 % — ABNORMAL LOW (ref 39.0–52.0)
Hemoglobin: 13 g/dL (ref 13.0–17.0)
Immature Granulocytes: 1 %
Lymphocytes Relative: 22 %
Lymphs Abs: 1 10*3/uL (ref 0.7–4.0)
MCH: 32 pg (ref 26.0–34.0)
MCHC: 34.4 g/dL (ref 30.0–36.0)
MCV: 93.1 fL (ref 80.0–100.0)
Monocytes Absolute: 0.6 10*3/uL (ref 0.1–1.0)
Monocytes Relative: 14 %
Neutro Abs: 2.6 10*3/uL (ref 1.7–7.7)
Neutrophils Relative %: 58 %
Platelet Count: 183 10*3/uL (ref 150–400)
RBC: 4.06 MIL/uL — ABNORMAL LOW (ref 4.22–5.81)
RDW: 12 % (ref 11.5–15.5)
WBC Count: 4.5 10*3/uL (ref 4.0–10.5)
nRBC: 0 % (ref 0.0–0.2)

## 2023-04-26 LAB — CMP (CANCER CENTER ONLY)
ALT: 23 U/L (ref 0–44)
AST: 19 U/L (ref 15–41)
Albumin: 4 g/dL (ref 3.5–5.0)
Alkaline Phosphatase: 38 U/L (ref 38–126)
Anion gap: 7 (ref 5–15)
BUN: 20 mg/dL (ref 8–23)
CO2: 26 mmol/L (ref 22–32)
Calcium: 9.1 mg/dL (ref 8.9–10.3)
Chloride: 106 mmol/L (ref 98–111)
Creatinine: 0.95 mg/dL (ref 0.61–1.24)
GFR, Estimated: >60 mL/min (ref >60)
Glucose, Bld: 95 mg/dL (ref 70–99)
Potassium: 4.1 mmol/L (ref 3.5–5.1)
Sodium: 139 mmol/L (ref 135–145)
Total Bilirubin: 0.3 mg/dL (ref 0.3–1.2)
Total Protein: 6.5 g/dL (ref 6.5–8.1)

## 2023-04-27 DIAGNOSIS — M4316 Spondylolisthesis, lumbar region: Secondary | ICD-10-CM | POA: Diagnosis not present

## 2023-05-01 ENCOUNTER — Other Ambulatory Visit: Payer: Medicare Other

## 2023-05-01 ENCOUNTER — Ambulatory Visit: Payer: Medicare Other | Admitting: Hematology and Oncology

## 2023-05-03 DIAGNOSIS — M1712 Unilateral primary osteoarthritis, left knee: Secondary | ICD-10-CM | POA: Diagnosis not present

## 2023-05-03 MED ORDER — POTASSIUM CHLORIDE ER 10 MEQ PO TBCR: 10.0000 meq | EXTENDED_RELEASE_TABLET | Freq: Every day | ORAL | 11 refills | Status: DC | PRN

## 2023-05-03 MED ORDER — FUROSEMIDE 20 MG PO TABS: 20.0000 mg | ORAL_TABLET | Freq: Every day | ORAL | 11 refills | Status: DC | PRN

## 2023-05-09 DIAGNOSIS — D6869 Other thrombophilia: Secondary | ICD-10-CM | POA: Diagnosis not present

## 2023-05-09 DIAGNOSIS — M25562 Pain in left knee: Secondary | ICD-10-CM | POA: Diagnosis not present

## 2023-05-09 DIAGNOSIS — E785 Hyperlipidemia, unspecified: Secondary | ICD-10-CM | POA: Diagnosis not present

## 2023-05-09 DIAGNOSIS — I25810 Atherosclerosis of coronary artery bypass graft(s) without angina pectoris: Secondary | ICD-10-CM | POA: Diagnosis not present

## 2023-05-09 DIAGNOSIS — G720 Drug-induced myopathy: Secondary | ICD-10-CM | POA: Diagnosis not present

## 2023-05-09 DIAGNOSIS — M25552 Pain in left hip: Secondary | ICD-10-CM | POA: Diagnosis not present

## 2023-05-09 DIAGNOSIS — I10 Essential (primary) hypertension: Secondary | ICD-10-CM | POA: Diagnosis not present

## 2023-05-09 DIAGNOSIS — K581 Irritable bowel syndrome with constipation: Secondary | ICD-10-CM | POA: Diagnosis not present

## 2023-05-09 DIAGNOSIS — I700 Atherosclerosis of aorta: Secondary | ICD-10-CM | POA: Diagnosis not present

## 2023-05-09 DIAGNOSIS — I82452 Acute embolism and thrombosis of left peroneal vein: Secondary | ICD-10-CM | POA: Diagnosis not present

## 2023-05-09 DIAGNOSIS — R7301 Impaired fasting glucose: Secondary | ICD-10-CM | POA: Diagnosis not present

## 2023-05-09 DIAGNOSIS — M48061 Spinal stenosis, lumbar region without neurogenic claudication: Secondary | ICD-10-CM | POA: Diagnosis not present

## 2023-05-12 ENCOUNTER — Other Ambulatory Visit: Payer: Medicare Other

## 2023-05-12 ENCOUNTER — Ambulatory Visit: Payer: Medicare Other | Admitting: Physician Assistant

## 2023-05-16 ENCOUNTER — Telehealth: Payer: Self-pay

## 2023-05-16 DIAGNOSIS — D692 Other nonthrombocytopenic purpura: Secondary | ICD-10-CM | POA: Diagnosis not present

## 2023-05-16 DIAGNOSIS — L821 Other seborrheic keratosis: Secondary | ICD-10-CM | POA: Diagnosis not present

## 2023-05-16 DIAGNOSIS — L720 Epidermal cyst: Secondary | ICD-10-CM | POA: Diagnosis not present

## 2023-05-16 DIAGNOSIS — D1801 Hemangioma of skin and subcutaneous tissue: Secondary | ICD-10-CM | POA: Diagnosis not present

## 2023-05-16 DIAGNOSIS — Z85828 Personal history of other malignant neoplasm of skin: Secondary | ICD-10-CM | POA: Diagnosis not present

## 2023-05-16 DIAGNOSIS — E785 Hyperlipidemia, unspecified: Secondary | ICD-10-CM

## 2023-05-16 DIAGNOSIS — L858 Other specified epidermal thickening: Secondary | ICD-10-CM | POA: Diagnosis not present

## 2023-05-16 DIAGNOSIS — L570 Actinic keratosis: Secondary | ICD-10-CM | POA: Diagnosis not present

## 2023-05-16 DIAGNOSIS — I25810 Atherosclerosis of coronary artery bypass graft(s) without angina pectoris: Secondary | ICD-10-CM

## 2023-05-16 DIAGNOSIS — D225 Melanocytic nevi of trunk: Secondary | ICD-10-CM | POA: Diagnosis not present

## 2023-05-16 DIAGNOSIS — L814 Other melanin hyperpigmentation: Secondary | ICD-10-CM | POA: Diagnosis not present

## 2023-05-16 NOTE — Telephone Encounter (Signed)
*  [facility]  PA form received via Fax for Repatha  PA form for continuation of therapy completed and faxed to plan with last chart notes attached  Pending determination  Copy of PA form attached to patients media for retention

## 2023-05-18 DIAGNOSIS — M1712 Unilateral primary osteoarthritis, left knee: Secondary | ICD-10-CM | POA: Diagnosis not present

## 2023-05-19 MED ORDER — REPATHA SURECLICK 140 MG/ML ~~LOC~~ SOAJ: 140.0000 mg | SUBCUTANEOUS | 1 refills | Status: DC

## 2023-05-19 NOTE — Telephone Encounter (Signed)
PA for Repatha approved from 04/17/23 to 05/16/24

## 2023-05-22 ENCOUNTER — Other Ambulatory Visit (HOSPITAL_BASED_OUTPATIENT_CLINIC_OR_DEPARTMENT_OTHER): Payer: Self-pay

## 2023-05-22 DIAGNOSIS — M25562 Pain in left knee: Secondary | ICD-10-CM | POA: Diagnosis not present

## 2023-05-23 ENCOUNTER — Other Ambulatory Visit (HOSPITAL_BASED_OUTPATIENT_CLINIC_OR_DEPARTMENT_OTHER): Payer: Self-pay

## 2023-05-24 ENCOUNTER — Other Ambulatory Visit (HOSPITAL_BASED_OUTPATIENT_CLINIC_OR_DEPARTMENT_OTHER): Payer: Self-pay

## 2023-05-24 DIAGNOSIS — M6281 Muscle weakness (generalized): Secondary | ICD-10-CM | POA: Diagnosis not present

## 2023-05-25 ENCOUNTER — Other Ambulatory Visit (HOSPITAL_BASED_OUTPATIENT_CLINIC_OR_DEPARTMENT_OTHER): Payer: Self-pay

## 2023-05-29 ENCOUNTER — Other Ambulatory Visit: Payer: Self-pay

## 2023-05-29 ENCOUNTER — Telehealth: Payer: Self-pay | Admitting: *Deleted

## 2023-05-29 ENCOUNTER — Telehealth: Payer: Self-pay

## 2023-05-29 DIAGNOSIS — I82462 Acute embolism and thrombosis of left calf muscular vein: Secondary | ICD-10-CM

## 2023-05-29 DIAGNOSIS — M1712 Unilateral primary osteoarthritis, left knee: Secondary | ICD-10-CM | POA: Diagnosis not present

## 2023-05-29 NOTE — Telephone Encounter (Signed)
Pt called with c/o R calf bruising and R toe bruising. He denies swelling and pain. He states that his ankles are often swollen but no other swelling that he has noticed related to this bruising. He has been seen recently by hematology and stated they drew labs. He is flying overseas this Saturday. I have advised him to call hematology to schedule an appt with them. Pt verbalized understanding and will call us back if they feel he should be seen here.

## 2023-05-29 NOTE — Progress Notes (Unsigned)
Symptom Management Consult Note [facility]    Patient Care Team: Cleatis Polka., MD as PCP - General (Internal Medicine) Lyn Records, MD (Inactive) as PCP - Cardiology (Cardiology) Ovidio Kin, MD as Consulting Physician (General Surgery)    Name / MRN / DOB: Nathan [name]  [id number]  04-26-1946   Date of visit: 05/30/2023   Chief Complaint/Reason for visit: bruising on legs  Current Therapy: Xarelto   ASSESSMENT & PLAN: Patient is a 77 y.o. male  with hematologic history of unprovoked LLE DVT followed by Dr. Leonides Schanz.  I have viewed most recent heme/onc note and lab work.    #Unprovoked LLE DVT - Next appointment with heme/onc is 10/27/23   #Lower extremiety ecchymosis -Exam with new unexplained ecchymosis on lower extremities. DP pulse 2+ bilaterally. Lower extremities well perfused.  -CBC shows mild drop in hemoglobin for 13 to 12.8 in 1 month with normal platelets. PT/INR and PTT with mild elevations, consistent with xarelto therapy. -Patient denies nosebleeds, gum bleeding, or dark stools  Discussed patient with Dr. Leonides Schanz who agrees with plan for continued monitoring at this time.  -Patient is leaving for [place] this week and will RTC upon his return if symptoms worsen. Discussed ED precautions.   Heme/Onc History: Oncology History   No history exists.      Interval history-: Nathan [name] is a 77 y.o. male with pertinent history of unprovoked LLE DVT presenting to [facility] today with chief complaint of bruising on his legs. He presents unaccompanied to clinic.  Patient reports 3 days ago he noticed new bruising on his right shin without any injury he could recall. Yesterday he noticed bruising on the bottom of his right foot and tops of both feet. He again denies any injury.  Patient denies any significant change in his activity.  He walks 1 mile per day although has held off the last 2 days because of the bruising.  He did have physical  therapy yesterday and used the exercise bike and elliptical.  He denies any difficulty while using the machines. He denies any shortness of breath or chest pain. Patient has been in his usual state of health, denies recent illness. He has been complaint with Xarelto and denies any bleeding gums or dark stool.  ROS  All other systems are reviewed and are negative for acute change except as noted in the HPI.    Allergies  Allergen Reactions   Bystolic [Nebivolol Hcl] Rash   Lexiscan [Regadenoson] Other (See Comments)    Sharp sternal pain   Triamcinolone Acetonide Acetate [Triamcinolone] Hives   Zetia [Ezetimibe] Diarrhea    GI effects - bloating, cramping, diarrhea on 5mg  and 10mg  dose   Amlodipine    Hydromorphone     Other Reaction(s): syncope after taking   Apixaban Hives, Itching and Rash    Other reaction(s): Not available   Lipitor [Atorvastatin] Rash   Lisinopril Nausea Only    GI upset   Neosporin [Neomycin-Bacitracin Zn-Polymyx] Rash   Pravastatin Rash   Toprol Xl [Metoprolol Tartrate] Rash     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    Blood transfusion without reported diagnosis    Complication of anesthesia    jittery after surgery   Coronary artery disease    GERD (gastroesophageal reflux disease)    History of DVT (deep vein thrombosis)    Hyperlipemia  Hypertension    IFG (impaired fasting glucose)    Right carpal tunnel syndrome 02/08/2017   Seasonal allergies    Wears glasses      Past Surgical History:  Procedure Laterality Date   APPENDECTOMY     age 45   COLONOSCOPY     COLONOSCOPY WITH PROPOFOL N/A 02/15/2016   Procedure: COLONOSCOPY WITH PROPOFOL;  Surgeon: Charolett Bumpers, MD;  Location: [facility];  Service: Endoscopy;  Laterality: N/A;   CORONARY ARTERY BYPASS GRAFT     2003   ESOPHAGOGASTRODUODENOSCOPY (EGD) WITH PROPOFOL N/A 02/15/2016   Procedure:  ESOPHAGOGASTRODUODENOSCOPY (EGD) WITH PROPOFOL;  Surgeon: Charolett Bumpers, MD;  Location: [facility];  Service: Endoscopy;  Laterality: N/A;   EXPLORATION POST OPERATIVE OPEN HEART     EYE SURGERY     detached retina-lt   INGUINAL HERNIA REPAIR Left 04/01/2014   Procedure: HERNIA REPAIR INGUINAL ADULT;  Surgeon: Kandis Cocking, MD;  Location: [facility];  Service: General;  Laterality: Left;   INSERTION OF MESH Left 04/01/2014   Procedure: INSERTION OF MESH;  Surgeon: Kandis Cocking, MD;  Location: [facility];  Service: General;  Laterality: Left;   KNEE ARTHROCENTESIS  1980   right   SHOULDER ACROMIOPLASTY  1985   rt   SINUS ENDO W/FUSION     TONSILLECTOMY AND ADENOIDECTOMY      Social History   Socioeconomic History   Marital status: Widowed    Spouse name: Not on file   Number of children: Not on file   Years of education: post grad   Highest education level: Not on file  Occupational History   Occupation: [employer]  Tobacco Use   Smoking status: Former    Types: Cigarettes    Quit date: 03/26/1969    Years since quitting: 54.2   Smokeless tobacco: Never  Substance and Sexual Activity   Alcohol use: Yes    Alcohol/week: 0.0 standard drinks of alcohol    Comment: 2 per day   Drug use: No   Sexual activity: Not Currently  Other Topics Concern   Not on file  Social History Narrative   Patient drinks 1-2 cups of caffeine daily.   Patient is left handed.    Social Determinants of Health   Financial Resource Strain: Not on file  Food Insecurity: Not on file  Transportation Needs: Not on file  Physical Activity: Not on file  Stress: Not on file  Social Connections: Not on file  Intimate Partner Violence: Not on file    Family History  Problem Relation Age of Onset   Heart disease Mother    Cancer Father    Alcohol abuse Brother    Kidney failure Brother      Current Outpatient Medications:    ammonium lactate (AMLACTIN) 12 %  lotion, Apply 1 Application topically as needed for dry skin., Disp: 400 g, Rfl: 0   cetirizine (ZYRTEC) 10 MG tablet, Take 10 mg by mouth daily., Disp: , Rfl:    cycloSPORINE (RESTASIS) 0.05 % ophthalmic emulsion, Place 1 drop into both eyes 2 (two) times daily. , Disp: , Rfl:    Evolocumab (REPATHA SURECLICK) 140 MG/ML SOAJ, Inject 140 mg into the skin every 14 (fourteen) days., Disp: 6 mL, Rfl: 1   flurazepam (DALMANE) 30 MG capsule, Take 1 capsule (30 mg total) by mouth at bedtime., Disp: 90 capsule, Rfl: 1   furosemide (LASIX) 20 MG tablet, Take 1 tablet (20 mg total) by  mouth daily as needed for fluid or edema., Disp: 30 tablet, Rfl: 11   GENTEAL 0.25-0.3 % GEL, Apply to eye daily., Disp: , Rfl:    olmesartan (BENICAR) 20 MG tablet, Take 10 mg by mouth daily. Take one half (0.5) tablet by mouth (10 mg) daily., Disp: , Rfl:    potassium chloride (KLOR-CON) 10 MEQ tablet, Take 1 tablet (10 mEq total) by mouth daily as needed (take with furosemide)., Disp: 301 tablet, Rfl: 11   SYSTANE 0.4-0.3 % SOLN, Apply to eye as needed., Disp: , Rfl:    triamcinolone (NASACORT) 55 MCG/ACT AERO nasal inhaler, Place 2 sprays into the nose daily., Disp: , Rfl:    XARELTO 20 MG TABS tablet, Take 20 mg by mouth daily., Disp: , Rfl:   PHYSICAL EXAM: ECOG FS:1 - Symptomatic but completely ambulatory    Vitals:   05/30/23 1107  BP: 135/66  Pulse: 88  Resp: 16  Temp: 98.4 F (36.9 C)  TempSrc: Oral  SpO2: 98%   Physical Exam Vitals and nursing note reviewed.  Constitutional:      Appearance: He is not ill-appearing or toxic-appearing.  HENT:     Head: Normocephalic.  Eyes:     Conjunctiva/sclera: Conjunctivae normal.  Cardiovascular:     Rate and Rhythm: Normal rate.     Pulses: Normal pulses.          Dorsalis pedis pulses are 2+ on the right side and 2+ on the left side.     Heart sounds: Normal heart sounds.  Pulmonary:     Effort: Pulmonary effort is normal.  Abdominal:     General:  There is no distension.  Musculoskeletal:     Cervical back: Normal range of motion.     Comments: Homans sign absent bilaterally, no palpable cords, compartments are soft    Feet:     Comments: Please see media below Skin:    General: Skin is warm and dry.     Capillary Refill: Capillary refill takes less than 2 seconds.     Findings: Bruising present.     Comments: Equal tactile temperature in BLE.  Neurological:     Mental Status: He is alert.             LABORATORY DATA: I have reviewed the data as listed    Latest Ref Rng & Units 05/30/2023   10:52 AM 04/26/2023    8:59 AM 11/30/2022   10:23 AM  CBC  WBC 4.0 - 10.5 K/uL 6.8  4.5  6.9   Hemoglobin 13.0 - 17.0 g/dL 16.1  09.6  04.5   Hematocrit 39.0 - 52.0 % 36.4  37.8  38.0   Platelets 150 - 400 K/uL 179  183  186         Latest Ref Rng & Units 05/30/2023   10:52 AM 04/26/2023    8:59 AM 04/18/2023   10:32 AM  CMP  Glucose 70 - 99 mg/dL 409  95  811   BUN 8 - 23 mg/dL 22  20  15    Creatinine 0.61 - 1.24 mg/dL 9.14  7.82  9.56   Sodium 135 - 145 mmol/L 139  139  136   Potassium 3.5 - 5.1 mmol/L 4.7  4.1  4.3   Chloride 98 - 111 mmol/L 106  106  101   CO2 22 - 32 mmol/L 29  26  22    Calcium 8.9 - 10.3 mg/dL 9.6  9.1  9.3   Total  Protein 6.5 - 8.1 g/dL 6.2  6.5  6.5   Total Bilirubin 0.3 - 1.2 mg/dL 0.4  0.3  0.3   Alkaline Phos 38 - 126 U/L 42  38  47   AST 15 - 41 U/L 19  19  22    ALT 0 - 44 U/L 22  23  26         RADIOGRAPHIC STUDIES (from last 24 hours if applicable) I have personally reviewed the radiological images as listed and agreed with the findings in the report. No results found.      Visit Diagnosis: 1. Acute deep vein thrombosis (DVT) of calf muscle vein of left lower extremity (HCC)   2. Bruising      Orders Placed This Encounter  Procedures   Protime-INR    Standing Status:   Future    Number of Occurrences:   1    Standing Expiration Date:   05/29/2024   APTT    Standing  Status:   Future    Number of Occurrences:   1    Standing Expiration Date:   05/29/2024    All questions were answered. The patient knows to call the clinic with any problems, questions or concerns. No barriers to learning was detected.  A total of more than 20 minutes were spent on this encounter with face-to-face time and non-face-to-face time, including preparing to see the patient, ordering tests, counseling the patient and coordination of care as outlined above.    Thank you for allowing me to participate in the care of this patient.    Shanon Ace, PA-C Department of Hematology/Oncology [facility] at [facility] Phone: [phone]  Fax:([phone]    05/30/2023 4:39 PM

## 2023-05-29 NOTE — Telephone Encounter (Signed)
TCT patient to advise that he can be seen in [facility] tomorrow. Spoke with him and advised to be here by 10:15 to get checked in, labs @ 10:30  and then [facility] @ 11 am. Pt voiced understanding and is appreciative of call

## 2023-05-29 NOTE — Telephone Encounter (Signed)
Received call from pt. He sees Dr. Leonides Schanz for new left leg DVT. Pt noted this morning that he has a bruise appearing on right calf, and on the bottom of his right foot. He also noted bruising/discoloration on the top of his left foot as well. Denies any recent injury to either foot or legs. States there is no pain, feet are warm to the touch. Pt is leaving for [place] on Saturday.  Kate-can you see him tomorrow?

## 2023-05-30 ENCOUNTER — Inpatient Hospital Stay: Payer: Medicare Other | Attending: Hematology and Oncology

## 2023-05-30 ENCOUNTER — Other Ambulatory Visit: Payer: Self-pay

## 2023-05-30 ENCOUNTER — Inpatient Hospital Stay (HOSPITAL_BASED_OUTPATIENT_CLINIC_OR_DEPARTMENT_OTHER): Payer: Medicare Other | Admitting: Physician Assistant

## 2023-05-30 VITALS — BP 135/66 | HR 88 | Temp 98.4°F | Resp 16

## 2023-05-30 DIAGNOSIS — I82462 Acute embolism and thrombosis of left calf muscular vein: Secondary | ICD-10-CM

## 2023-05-30 DIAGNOSIS — Z86718 Personal history of other venous thrombosis and embolism: Secondary | ICD-10-CM | POA: Diagnosis not present

## 2023-05-30 DIAGNOSIS — Z87891 Personal history of nicotine dependence: Secondary | ICD-10-CM | POA: Diagnosis not present

## 2023-05-30 DIAGNOSIS — T148XXA Other injury of unspecified body region, initial encounter: Secondary | ICD-10-CM

## 2023-05-30 DIAGNOSIS — Z7901 Long term (current) use of anticoagulants: Secondary | ICD-10-CM | POA: Diagnosis not present

## 2023-05-30 LAB — CBC WITH DIFFERENTIAL (CANCER CENTER ONLY)
Abs Immature Granulocytes: 0.03 10*3/uL (ref 0.00–0.07)
Basophils Absolute: 0.1 10*3/uL (ref 0.0–0.1)
Basophils Relative: 1 %
Eosinophils Absolute: 0.1 10*3/uL (ref 0.0–0.5)
Eosinophils Relative: 1 %
HCT: 36.4 % — ABNORMAL LOW (ref 39.0–52.0)
Hemoglobin: 12.8 g/dL — ABNORMAL LOW (ref 13.0–17.0)
Immature Granulocytes: 0 %
Lymphocytes Relative: 20 %
Lymphs Abs: 1.4 10*3/uL (ref 0.7–4.0)
MCH: 33.1 pg (ref 26.0–34.0)
MCHC: 35.2 g/dL (ref 30.0–36.0)
MCV: 94.1 fL (ref 80.0–100.0)
Monocytes Absolute: 0.8 10*3/uL (ref 0.1–1.0)
Monocytes Relative: 12 %
Neutro Abs: 4.5 10*3/uL (ref 1.7–7.7)
Neutrophils Relative %: 66 %
Platelet Count: 179 10*3/uL (ref 150–400)
RBC: 3.87 MIL/uL — ABNORMAL LOW (ref 4.22–5.81)
RDW: 11.9 % (ref 11.5–15.5)
WBC Count: 6.8 10*3/uL (ref 4.0–10.5)
nRBC: 0 % (ref 0.0–0.2)

## 2023-05-30 LAB — CMP (CANCER CENTER ONLY)
ALT: 22 U/L (ref 0–44)
AST: 19 U/L (ref 15–41)
Albumin: 3.8 g/dL (ref 3.5–5.0)
Alkaline Phosphatase: 42 U/L (ref 38–126)
Anion gap: 4 — ABNORMAL LOW (ref 5–15)
BUN: 22 mg/dL (ref 8–23)
CO2: 29 mmol/L (ref 22–32)
Calcium: 9.6 mg/dL (ref 8.9–10.3)
Chloride: 106 mmol/L (ref 98–111)
Creatinine: 0.92 mg/dL (ref 0.61–1.24)
GFR, Estimated: >60 mL/min (ref >60)
Glucose, Bld: 103 mg/dL — ABNORMAL HIGH (ref 70–99)
Potassium: 4.7 mmol/L (ref 3.5–5.1)
Sodium: 139 mmol/L (ref 135–145)
Total Bilirubin: 0.4 mg/dL (ref 0.3–1.2)
Total Protein: 6.2 g/dL — ABNORMAL LOW (ref 6.5–8.1)

## 2023-05-30 LAB — APTT: aPTT: 44 seconds — ABNORMAL HIGH (ref 24–36)

## 2023-05-30 LAB — PROTIME-INR
INR: 2.8 — ABNORMAL HIGH (ref 0.8–1.2)
Prothrombin Time: 29.4 seconds — ABNORMAL HIGH (ref 11.4–15.2)

## 2023-05-31 DIAGNOSIS — M25562 Pain in left knee: Secondary | ICD-10-CM | POA: Diagnosis not present

## 2023-06-01 DIAGNOSIS — M1712 Unilateral primary osteoarthritis, left knee: Secondary | ICD-10-CM | POA: Diagnosis not present

## 2023-06-19 ENCOUNTER — Telehealth: Payer: Self-pay

## 2023-06-19 DIAGNOSIS — M7989 Other specified soft tissue disorders: Secondary | ICD-10-CM

## 2023-06-19 NOTE — Telephone Encounter (Signed)
Caller: Patient  Concern: Returned yesterday from 15 day trip to [place], legs swollen prior to trip, but worsened during trip and no improvement with compressions and exercise done during flights. Pt has hx of DVT's and is concerned, but denies any severe pain, warmth, or hardened reddened areas.  Location: left leg, right leg  Description:  some swelling prior to trip, but worsened during and after return  Quality: uncomfortable  Treatments:  instructed pt to keep wearing compressions and elevate above heart at HS and several times during the day  Resolution: Appointment scheduled for first available  Next Appt: Appointment scheduled for 7/10 @ 1100 for LE reflux and Dr. Randie Heinz

## 2023-06-20 NOTE — Telephone Encounter (Signed)
Pt called to give an update after proper elevation, stating that the swelling has improved. He asked if he still needed the [place] with his visit tomorrow.  Reviewed pt's chart, returned call for clarification, two identifiers used. Informed him that he still needed the [place], which would check for a cause of swelling and also a possible DVT. Confirmed understanding.

## 2023-06-21 ENCOUNTER — Ambulatory Visit (HOSPITAL_COMMUNITY)
Admission: RE | Admit: 2023-06-21 | Discharge: 2023-06-21 | Disposition: A | Discharge status: Home / Self Care | Payer: Medicare Other | Source: Ambulatory Visit | Attending: Vascular Surgery | Admitting: Vascular Surgery

## 2023-06-21 ENCOUNTER — Other Ambulatory Visit: Payer: Self-pay | Admitting: Vascular Surgery

## 2023-06-21 ENCOUNTER — Encounter: Payer: Self-pay | Admitting: Vascular Surgery

## 2023-06-21 ENCOUNTER — Ambulatory Visit (INDEPENDENT_AMBULATORY_CARE_PROVIDER_SITE_OTHER): Payer: Medicare Other | Admitting: Vascular Surgery

## 2023-06-21 VITALS — BP 176/79 | HR 73 | Temp 98.2°F | Resp 20 | Ht 68.0 in | Wt 190.0 lb

## 2023-06-21 DIAGNOSIS — M7989 Other specified soft tissue disorders: Secondary | ICD-10-CM | POA: Diagnosis not present

## 2023-06-21 DIAGNOSIS — I87002 Postthrombotic syndrome without complications of left lower extremity: Secondary | ICD-10-CM | POA: Diagnosis not present

## 2023-06-21 NOTE — Progress Notes (Signed)
Patient ID: Nathan [name], male   DOB: 1946/07/02, 77 y.o.   MRN: [id number]  Reason for Consult: Follow-up   Referred by Cleatis Polka., MD  Subjective:     HPI:  Nathan [name] is a 77 y.o. 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  He also had a previous history of an extensive left lower extremity DVT and subsequently was referred to hematology and is now on Xarelto.  He has also been evaluated by orthopedic surgery in the past states that he does not have bone-on-bone disease on the left knee but that we could have injections if he were not on anticoagulation.  He is also been followed by neurosurgery for degenerative disc disease but no previous interventions and currently with anticoagulation is not a candidate for surgical intervention.  He states that he was recently on a trip to [place] and had skin changes including dryness to both of his legs and also has swelling which he states that sometimes is severe in nature and severely limits his activity level.  He is working with PT states that he has difficulty even raising his left leg off the bed.  He was able to walk a mile after significant workup but currently cannot walk near a mile and is concerned that he has severe issues with his legs particularly with his arteries and veins.  He is religiously taking his anticoagulant and is having easy bruising all over his body.  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    Blood transfusion without reported diagnosis    Complication of anesthesia    jittery after surgery   Coronary artery disease    GERD (gastroesophageal reflux disease)    History of DVT (deep vein thrombosis)    Hyperlipemia    Hypertension    IFG (impaired fasting glucose)    Right carpal tunnel syndrome 02/08/2017   Seasonal  allergies    Wears glasses    Family History  Problem Relation Age of Onset   Heart disease Mother    Cancer Father    Alcohol abuse Brother    Kidney failure Brother    Past Surgical History:  Procedure Laterality Date   APPENDECTOMY     age 33   COLONOSCOPY     COLONOSCOPY WITH PROPOFOL N/A 02/15/2016   Procedure: COLONOSCOPY WITH PROPOFOL;  Surgeon: Charolett Bumpers, MD;  Location: [facility];  Service: Endoscopy;  Laterality: N/A;   CORONARY ARTERY BYPASS GRAFT     2003   ESOPHAGOGASTRODUODENOSCOPY (EGD) WITH PROPOFOL N/A 02/15/2016   Procedure: ESOPHAGOGASTRODUODENOSCOPY (EGD) WITH PROPOFOL;  Surgeon: Charolett Bumpers, MD;  Location: [facility];  Service: Endoscopy;  Laterality: N/A;   EXPLORATION POST OPERATIVE OPEN HEART     EYE SURGERY     detached retina-lt   INGUINAL HERNIA REPAIR Left 04/01/2014   Procedure: HERNIA REPAIR INGUINAL ADULT;  Surgeon: Kandis Cocking, MD;  Location: [facility];  Service: General;  Laterality: Left;   INSERTION OF MESH Left 04/01/2014   Procedure: INSERTION OF MESH;  Surgeon: Kandis Cocking, MD;  Location: [facility];  Service: General;  Laterality: Left;   KNEE ARTHROCENTESIS  1980   right   SHOULDER ACROMIOPLASTY  1985   rt   SINUS ENDO  W/FUSION     TONSILLECTOMY AND ADENOIDECTOMY      Short Social History:  Social History   Tobacco Use   Smoking status: Former    Types: Cigarettes    Quit date: 03/26/1969    Years since quitting: 54.2   Smokeless tobacco: Never  Substance Use Topics   Alcohol use: Yes    Alcohol/week: 0.0 standard drinks of alcohol    Comment: 2 per day    Allergies  Allergen Reactions   Bystolic [Nebivolol Hcl] Rash   Lexiscan [Regadenoson] Other (See Comments)    Sharp sternal pain   Triamcinolone Acetonide Acetate [Triamcinolone] Hives   Zetia [Ezetimibe] Diarrhea    GI effects - bloating, cramping, diarrhea on 5mg  and 10mg  dose   Amlodipine    Hydromorphone     Other  Reaction(s): syncope after taking   Apixaban Hives, Itching and Rash    Other reaction(s): Not available   Lipitor [Atorvastatin] Rash   Lisinopril Nausea Only    GI upset   Neosporin [Neomycin-Bacitracin Zn-Polymyx] Rash   Pravastatin Rash   Toprol Xl [Metoprolol Tartrate] Rash    Current Outpatient Medications  Medication Sig Dispense Refill   cetirizine (ZYRTEC) 10 MG tablet Take 10 mg by mouth daily.     cycloSPORINE (RESTASIS) 0.05 % ophthalmic emulsion Place 1 drop into both eyes 2 (two) times daily.      Evolocumab (REPATHA SURECLICK) 140 MG/ML SOAJ Inject 140 mg into the skin every 14 (fourteen) days. 6 mL 1   flurazepam (DALMANE) 30 MG capsule Take 1 capsule (30 mg total) by mouth at bedtime. 90 capsule 1   GENTEAL 0.25-0.3 % GEL Apply to eye daily.     olmesartan (BENICAR) 20 MG tablet Take 10 mg by mouth daily. Take one half (0.5) tablet by mouth (10 mg) daily.     SYSTANE 0.4-0.3 % SOLN Apply to eye as needed.     triamcinolone (NASACORT) 55 MCG/ACT AERO nasal inhaler Place 2 sprays into the nose daily.     XARELTO 20 MG TABS tablet Take 20 mg by mouth daily.     furosemide (LASIX) 20 MG tablet Take 20 mg by mouth daily as needed for edema. (Patient not taking: Reported on 06/21/2023)     potassium chloride (KLOR-CON) 20 MEQ packet Take 20 mEq by mouth daily as needed (take with lasix). (Patient not taking: Reported on 06/21/2023)     No current facility-administered medications for this visit.    Review of Systems  Constitutional:  Constitutional negative. HENT: HENT negative.  Eyes: Eyes negative.  Respiratory: Respiratory negative.  Cardiovascular: Positive for leg swelling.  GI: Gastrointestinal negative.  Musculoskeletal: Positive for back pain, gait problem, leg pain and joint pain.  Skin:       Dry skin Hematologic: Positive for bruises/bleeds easily.  Psychiatric: Psychiatric negative.        Objective:  Objective   Vitals:   06/21/23 1110  BP: (!)  176/79  Pulse: 73  Resp: 20  Temp: 98.2 F (36.8 C)  SpO2: 97%  Weight: 190 lb (86.2 kg)  Height: 5\' 8"  (1.727 m)   Body mass index is 28.89 kg/m.  Physical Exam HENT:     Head: Normocephalic.     Nose: Nose normal.  Eyes:     Pupils: Pupils are equal, round, and reactive to light.  Cardiovascular:     Rate and Rhythm: Normal rate.     Pulses: Normal pulses.  Abdominal:  General: Abdomen is flat.     Palpations: Abdomen is soft.  Musculoskeletal:     Cervical back: Neck supple.     Comments: Trace bilateral lower extremity ankle edema  Skin:    Capillary Refill: Capillary refill takes less than 2 seconds.     Comments: There are bruises on his back and flanks as well as his bilateral upper extremities  Neurological:     General: No focal deficit present.     Mental Status: He is alert.  Psychiatric:        Mood and Affect: Mood normal.     Data: RIGHT    CompressibilityPhasicitySpontaneityPropertiesThrombus  Aging  +---------+---------------+---------+-----------+----------+--------------+   CFV     Full           Yes      Yes                                   +---------+---------------+---------+-----------+----------+--------------+   SFJ     Full                                                          +---------+---------------+---------+-----------+----------+--------------+   FV Prox  Full                                                          +---------+---------------+---------+-----------+----------+--------------+   FV Mid   Full                                                          +---------+---------------+---------+-----------+----------+--------------+   FV DistalFull                                                          +---------+---------------+---------+-----------+----------+--------------+   PFV     Full                                                           +---------+---------------+---------+-----------+----------+--------------+   POP     Full           Yes      Yes                                   +---------+---------------+---------+-----------+----------+--------------+   PTV     Full                                                          +---------+---------------+---------+-----------+----------+--------------+  PERO    Full                                                          +---------+---------------+---------+-----------+----------+--------------+          +---------+---------------+---------+-----------+----------+--------------+   LEFT    CompressibilityPhasicitySpontaneityPropertiesThrombus  Aging  +---------+---------------+---------+-----------+----------+--------------+   CFV     Full           Yes      Yes                                   +---------+---------------+---------+-----------+----------+--------------+   SFJ     Full                                                          +---------+---------------+---------+-----------+----------+--------------+   FV Prox  Full                                                          +---------+---------------+---------+-----------+----------+--------------+   FV Mid   Full                                                          +---------+---------------+---------+-----------+----------+--------------+   FV DistalFull                                                          +---------+---------------+---------+-----------+----------+--------------+   PFV     Full                                                          +---------+---------------+---------+-----------+----------+--------------+   POP     Full           Yes      Yes                                   +---------+---------------+---------+-----------+----------+--------------+   PTV     Full                                                           +---------+---------------+---------+-----------+----------+--------------+   PERO  Full                                                          +---------+---------------+---------+-----------+----------+--------------+        Summary:  RIGHT:  - There is no evidence of deep vein thrombosis in the lower extremity.    - No cystic structure found in the popliteal fossa.    LEFT:  - There is no evidence of deep vein thrombosis in the lower extremity.    - A cystic structure is found in the popliteal fossa.      Assessment/Plan:    77 year-old male with history of lower extremity DVT on the left x 2 followed by hematology maintained on Xarelto.  He is having significant issues of his bilateral lower extremities mostly with swelling of the left greater than the right but also has pain radiating from the hip down and also knee pain and has been followed by neurosurgery as well as orthopedic surgery in the past and currently working with physical therapy.  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  He did have a Baker's cyst which contributed to this.  Currently on evaluation his swelling is not severe although he does have trace edema in the bilateral ankles.  I am unsure that level of edema is truly causing the symptoms that he is having.  I discussed with him compression stockings and working out in the pool continuing Xarelto at the direction of hematology.  We discussed that if he would like to do everything to treat his edema then we would evaluate with lower extremity reflux studies realizing that his edema does not appear to severe and he is unlikely to have significant improvement from saphenous vein ablation alone I did discuss that if the toe is not great saphenous vein  reflux for treatment may potentially have a component of lymphedema.  Either way he needs continued exercise with PT and we could consider lymphedema pumps and consideration of swimming pool workouts and also compression stockings as tolerated.  I will see him back in a few weeks with lower extremity reflux testing.     Maeola Harman MD [facility]

## 2023-06-23 ENCOUNTER — Telehealth: Payer: Self-pay | Admitting: *Deleted

## 2023-06-23 MED ORDER — RIVAROXABAN 10 MG PO TABS: 10.0000 mg | ORAL_TABLET | Freq: Every day | ORAL | 3 refills | Status: DC

## 2023-06-23 NOTE — Telephone Encounter (Signed)
Received call from pt. He states he has returned form his trip abroad and would like his new prescription for Xarelto 10 mg sent in to his pharmacy. Per Dr. Derek Mound note on 04/26/23 , Dr. Leonides Schanz did recommend reducing pt's Xarelto to 10 mg  daily. This prescription has been escribed

## 2023-06-29 DIAGNOSIS — M6281 Muscle weakness (generalized): Secondary | ICD-10-CM | POA: Diagnosis not present

## 2023-07-03 DIAGNOSIS — M25562 Pain in left knee: Secondary | ICD-10-CM | POA: Diagnosis not present

## 2023-07-05 ENCOUNTER — Other Ambulatory Visit: Payer: Self-pay

## 2023-07-05 DIAGNOSIS — I87002 Postthrombotic syndrome without complications of left lower extremity: Secondary | ICD-10-CM

## 2023-07-05 DIAGNOSIS — M79605 Pain in left leg: Secondary | ICD-10-CM

## 2023-07-07 DIAGNOSIS — M25562 Pain in left knee: Secondary | ICD-10-CM | POA: Diagnosis not present

## 2023-07-10 DIAGNOSIS — G5601 Carpal tunnel syndrome, right upper limb: Secondary | ICD-10-CM | POA: Diagnosis not present

## 2023-07-10 DIAGNOSIS — M65342 Trigger finger, left ring finger: Secondary | ICD-10-CM | POA: Diagnosis not present

## 2023-07-10 DIAGNOSIS — M7541 Impingement syndrome of right shoulder: Secondary | ICD-10-CM | POA: Diagnosis not present

## 2023-07-10 DIAGNOSIS — M1811 Unilateral primary osteoarthritis of first carpometacarpal joint, right hand: Secondary | ICD-10-CM | POA: Diagnosis not present

## 2023-07-12 DIAGNOSIS — M6281 Muscle weakness (generalized): Secondary | ICD-10-CM | POA: Diagnosis not present

## 2023-07-17 DIAGNOSIS — M25552 Pain in left hip: Secondary | ICD-10-CM | POA: Diagnosis not present

## 2023-07-17 DIAGNOSIS — M25562 Pain in left knee: Secondary | ICD-10-CM | POA: Diagnosis not present

## 2023-07-18 DIAGNOSIS — M6281 Muscle weakness (generalized): Secondary | ICD-10-CM | POA: Diagnosis not present

## 2023-07-20 DIAGNOSIS — M6281 Muscle weakness (generalized): Secondary | ICD-10-CM | POA: Diagnosis not present

## 2023-07-24 DIAGNOSIS — M6281 Muscle weakness (generalized): Secondary | ICD-10-CM | POA: Diagnosis not present

## 2023-08-01 DIAGNOSIS — M79605 Pain in left leg: Secondary | ICD-10-CM | POA: Diagnosis not present

## 2023-08-02 ENCOUNTER — Encounter: Payer: Self-pay | Admitting: Vascular Surgery

## 2023-08-02 ENCOUNTER — Ambulatory Visit (INDEPENDENT_AMBULATORY_CARE_PROVIDER_SITE_OTHER): Payer: Medicare Other | Admitting: Vascular Surgery

## 2023-08-02 ENCOUNTER — Ambulatory Visit (HOSPITAL_COMMUNITY)
Admission: RE | Admit: 2023-08-02 | Discharge: 2023-08-02 | Disposition: A | Discharge status: Home / Self Care | Payer: Medicare Other | Source: Ambulatory Visit | Attending: Vascular Surgery | Admitting: Vascular Surgery

## 2023-08-02 VITALS — BP 159/78 | HR 78 | Temp 98.0°F | Resp 20 | Ht 68.0 in | Wt 190.0 lb

## 2023-08-02 DIAGNOSIS — M7989 Other specified soft tissue disorders: Secondary | ICD-10-CM | POA: Diagnosis not present

## 2023-08-02 DIAGNOSIS — R29898 Other symptoms and signs involving the musculoskeletal system: Secondary | ICD-10-CM

## 2023-08-02 DIAGNOSIS — M79605 Pain in left leg: Secondary | ICD-10-CM | POA: Diagnosis not present

## 2023-08-02 DIAGNOSIS — I87002 Postthrombotic syndrome without complications of left lower extremity: Secondary | ICD-10-CM

## 2023-08-02 NOTE — Progress Notes (Signed)
Patient ID: Nathan [name], male   DOB: January 11, 1946, 77 y.o.   MRN: [id number]  Reason for Consult: Follow-up   Referred by Cleatis Polka., MD  Subjective:     HPI:  Nathan [name] is a 77 y.o. 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  Patient has been evaluated by orthopedics in the past as well as neurosurgery for osteoarthritis and degenerative disc disease.  Patient has been working with physical therapy as well as working out at [employer] for recent weakness in his lower extremities.  Weakness is his chief complaint at this time but it still only in the left lower extremity where he had a DVT.  He is here today with lower extremity reflux testing.  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    Blood transfusion without reported diagnosis    Complication of anesthesia    jittery after surgery   Coronary artery disease    GERD (gastroesophageal reflux disease)    History of DVT (deep vein thrombosis)    Hyperlipemia    Hypertension    IFG (impaired fasting glucose)    Right carpal tunnel syndrome 02/08/2017   Seasonal allergies    Wears glasses    Family History  Problem Relation Age of Onset   Heart disease Mother    Cancer Father    Alcohol abuse Brother    Kidney failure Brother    Past Surgical History:  Procedure Laterality Date   APPENDECTOMY     age 17   COLONOSCOPY     COLONOSCOPY WITH PROPOFOL N/A 02/15/2016   Procedure: COLONOSCOPY WITH PROPOFOL;  Surgeon: Charolett Bumpers, MD;  Location: [facility];  Service: Endoscopy;  Laterality: N/A;   CORONARY ARTERY BYPASS GRAFT     2003   ESOPHAGOGASTRODUODENOSCOPY (EGD) WITH PROPOFOL N/A 02/15/2016   Procedure: ESOPHAGOGASTRODUODENOSCOPY (EGD) WITH PROPOFOL;  Surgeon: Charolett Bumpers, MD;  Location: [facility];  Service: Endoscopy;   Laterality: N/A;   EXPLORATION POST OPERATIVE OPEN HEART     EYE SURGERY     detached retina-lt   INGUINAL HERNIA REPAIR Left 04/01/2014   Procedure: HERNIA REPAIR INGUINAL ADULT;  Surgeon: Kandis Cocking, MD;  Location: [facility];  Service: General;  Laterality: Left;   INSERTION OF MESH Left 04/01/2014   Procedure: INSERTION OF MESH;  Surgeon: Kandis Cocking, MD;  Location: [facility];  Service: General;  Laterality: Left;   KNEE ARTHROCENTESIS  1980   right   SHOULDER ACROMIOPLASTY  1985   rt   SINUS ENDO W/FUSION     TONSILLECTOMY AND ADENOIDECTOMY      Short Social History:  Social History   Tobacco Use   Smoking status: Former    Current packs/day: 0.00    Types: Cigarettes    Quit date: 03/26/1969    Years since quitting: 54.3   Smokeless tobacco: Never  Substance Use Topics   Alcohol use: Yes    Alcohol/week: 0.0 standard drinks of alcohol    Comment: 2 per day    Allergies  Allergen Reactions   Bystolic [Nebivolol Hcl] Rash   Lexiscan [Regadenoson] Other (See Comments)    Sharp sternal pain   Triamcinolone Acetonide Acetate [Triamcinolone] Hives   Zetia [Ezetimibe] Diarrhea    GI effects -  bloating, cramping, diarrhea on 5mg  and 10mg  dose   Amlodipine    Hydromorphone     Other Reaction(s): syncope after taking   Apixaban Hives, Itching and Rash    Other reaction(s): Not available   Lipitor [Atorvastatin] Rash   Lisinopril Nausea Only    GI upset   Neosporin [Neomycin-Bacitracin Zn-Polymyx] Rash   Pravastatin Rash   Toprol Xl [Metoprolol Tartrate] Rash    Current Outpatient Medications  Medication Sig Dispense Refill   cetirizine (ZYRTEC) 10 MG tablet Take 10 mg by mouth daily.     cycloSPORINE (RESTASIS) 0.05 % ophthalmic emulsion Place 1 drop into both eyes 2 (two) times daily.      Evolocumab (REPATHA SURECLICK) 140 MG/ML SOAJ Inject 140 mg into the skin every 14 (fourteen) days. 6 mL 1   flurazepam (DALMANE) 30 MG  capsule Take 1 capsule (30 mg total) by mouth at bedtime. 90 capsule 1   furosemide (LASIX) 20 MG tablet Take 20 mg by mouth daily as needed for edema. (Patient not taking: Reported on 06/21/2023)     GENTEAL 0.25-0.3 % GEL Apply to eye daily.     olmesartan (BENICAR) 20 MG tablet Take 10 mg by mouth daily. Take one half (0.5) tablet by mouth (10 mg) daily.     potassium chloride (KLOR-CON) 20 MEQ packet Take 20 mEq by mouth daily as needed (take with lasix). (Patient not taking: Reported on 06/21/2023)     rivaroxaban (XARELTO) 10 MG TABS tablet Take 1 tablet (10 mg total) by mouth daily. 90 tablet 3   SYSTANE 0.4-0.3 % SOLN Apply to eye as needed.     triamcinolone (NASACORT) 55 MCG/ACT AERO nasal inhaler Place 2 sprays into the nose daily.     No current facility-administered medications for this visit.    Review of Systems  Constitutional:  Constitutional negative. HENT: HENT negative.  Eyes: Eyes negative.  Respiratory: Respiratory negative.  Cardiovascular: Positive for leg swelling.  GI: Gastrointestinal negative.  Musculoskeletal: Positive for back pain, gait problem, leg pain and joint pain.  Hematologic: Positive for bruises/bleeds easily.        Objective:  Objective   Vitals:   08/02/23 1225  BP: (!) 159/78  Pulse: 78  Resp: 20  Temp: 98 F (36.7 C)  SpO2: 95%     Physical Exam HENT:     Head: Normocephalic.     Nose: Nose normal.  Eyes:     Pupils: Pupils are equal, round, and reactive to light.  Cardiovascular:     Rate and Rhythm: Normal rate.     Pulses:          Popliteal pulses are 2+ on the right side and 2+ on the left side.  Abdominal:     General: Abdomen is flat.  Musculoskeletal:     Right lower leg: No edema.     Comments: Trace left ankle edema  Skin:    General: Skin is warm.     Capillary Refill: Capillary refill takes less than 2 seconds.  Neurological:     General: No focal deficit present.     Mental Status: He is alert and  oriented to person, place, and time.  Psychiatric:        Mood and Affect: Mood normal.        Judgment: Judgment normal.     Data: Venous Reflux Times  +--------------+--------+------+----------+------------+-------------------  ----+  RIGHT        Reflux  Reflux  Reflux  Diameter cmsComments                                No       Yes     Time                                         +--------------+--------+------+----------+------------+-------------------  ----+  CFV                   yes  >1 second                                       +--------------+--------+------+----------+------------+-------------------  ----+  FV prox       no                                                            +--------------+--------+------+----------+------------+-------------------  ----+  FV mid        no                                  Vein duplication          +--------------+--------+------+----------+------------+-------------------  ----+  FV dist       no                                                            +--------------+--------+------+----------+------------+-------------------  ----+  Popliteal    no                                                            +--------------+--------+------+----------+------------+-------------------  ----+  GSV at [facility]             yes   >500 ms      0.97                              +--------------+--------+------+----------+------------+-------------------  ----+  GSV prox thigh                                    Prior vein harvest        +--------------+--------+------+----------+------------+-------------------  ----+  GSV mid thigh                                     prior  ablation/stripping        +--------------+--------+------+----------+------------+-------------------   ----+  GSV dist thigh                                    prior                                                                       ablation/stripping        +--------------+--------+------+----------+------------+-------------------  ----+  GSV at knee                                       prior                                                                       ablation/stripping        +--------------+--------+------+----------+------------+-------------------  ----+  GSV prox calf          yes   >500 ms      0.28                              +--------------+--------+------+----------+------------+-------------------  ----+  SSV Pop Fossa no                          0.21                              +--------------+--------+------+----------+------------+-------------------  ----+  SSV prox calf no                          0.36                              +--------------+--------+------+----------+------------+-------------------  ----+  AASV Prox     no                          0.45                              +--------------+--------+------+----------+------------+-------------------  ----+     +--------------+---------+------+-----------+------------+---------------+  LEFT         Reflux NoRefluxReflux TimeDiameter cmsComments                                 Yes                                          +--------------+---------+------+-----------+------------+---------------+  CFV                    yes   >1 second                              +--------------+---------+------+-----------+------------+---------------+  FV prox                 yes   >1 second                              +--------------+---------+------+-----------+------------+---------------+  FV mid                  yes   >1 second             duplicated vein   +--------------+---------+------+-----------+------------+---------------+  FV dist                 yes   >1 second             duplicated vein  +--------------+---------+------+-----------+------------+---------------+  Popliteal              yes   >1 second                              +--------------+---------+------+-----------+------------+---------------+  GSV at SFJ              yes    >500 ms      0.92                     +--------------+---------+------+-----------+------------+---------------+  GSV prox thigh          yes    >500 ms      0.60                     +--------------+---------+------+-----------+------------+---------------+  GSV mid thigh no                            0.39    branching        +--------------+---------+------+-----------+------------+---------------+  GSV dist thighno                            0.27    branching        +--------------+---------+------+-----------+------------+---------------+  GSV at knee   no                            0.27                     +--------------+---------+------+-----------+------------+---------------+  GSV prox calf no                            0.23                     +--------------+---------+------+-----------+------------+---------------+  SSV Pop Fossa           yes    >500 ms      0.26                     +--------------+---------+------+-----------+------------+---------------+  SSV prox calf no  0.57                     +--------------+---------+------+-----------+------------+---------------+  PFV                    yes   >1 second                              +--------------+---------+------+-----------+------------+---------------+         Summary:  Right:  - No evidence of deep vein thrombosis seen in the right lower extremity,  from the common femoral through the popliteal veins.  - No evidence of  superficial venous reflux seen in the right short  saphenous vein.    - Venous reflux is noted in the right common femoral vein.  - Venous reflux is noted in the right sapheno-femoral junction.  - Venous reflux is noted in the right greater saphenous vein in the calf.    - Right GSV surgically removed from the proximal thigh to the knee.    Left:  - No evidence of deep vein thrombosis seen in the left lower extremity,  from the common femoral through the popliteal veins.  - No evidence of superficial venous thrombosis in the left lower  extremity.    - Venous reflux is noted in the left common femoral vein.  - Venous reflux is noted in the left sapheno-femoral junction.  - Venous reflux is noted in the left greater saphenous vein in the thigh.  - Venous reflux is noted in the left femoral vein.  - Venous reflux is noted in the left popliteal vein.  - Venous reflux is noted in the left short saphenous vein.   - Left popliteal fossa complex cystic structure without vascularity.  Sonographic findings are consistent with Baker's cyst vs ruptured Baker's  cyst.         Assessment/Plan:    77 year old male with left lower extremity weakness history of DVT.  He does have significant deep venous reflux with only mild superficial although unlikely cause of his weakness.  He also has a Baker's cyst but no real pain at this time.  He does not have an indication for any vascular invention at this time and can see me on an as-needed basis.     Maeola Harman MD [facility]

## 2023-08-03 DIAGNOSIS — M79605 Pain in left leg: Secondary | ICD-10-CM | POA: Diagnosis not present

## 2023-08-08 DIAGNOSIS — M6281 Muscle weakness (generalized): Secondary | ICD-10-CM | POA: Diagnosis not present

## 2023-08-15 DIAGNOSIS — M6281 Muscle weakness (generalized): Secondary | ICD-10-CM | POA: Diagnosis not present

## 2023-08-17 DIAGNOSIS — M25562 Pain in left knee: Secondary | ICD-10-CM | POA: Diagnosis not present

## 2023-08-31 DIAGNOSIS — E291 Testicular hypofunction: Secondary | ICD-10-CM | POA: Diagnosis not present

## 2023-08-31 DIAGNOSIS — R7301 Impaired fasting glucose: Secondary | ICD-10-CM | POA: Diagnosis not present

## 2023-08-31 DIAGNOSIS — E785 Hyperlipidemia, unspecified: Secondary | ICD-10-CM | POA: Diagnosis not present

## 2023-08-31 DIAGNOSIS — M6281 Muscle weakness (generalized): Secondary | ICD-10-CM | POA: Diagnosis not present

## 2023-08-31 DIAGNOSIS — Z125 Encounter for screening for malignant neoplasm of prostate: Secondary | ICD-10-CM | POA: Diagnosis not present

## 2023-08-31 DIAGNOSIS — I10 Essential (primary) hypertension: Secondary | ICD-10-CM | POA: Diagnosis not present

## 2023-09-07 DIAGNOSIS — I10 Essential (primary) hypertension: Secondary | ICD-10-CM | POA: Diagnosis not present

## 2023-09-07 DIAGNOSIS — K581 Irritable bowel syndrome with constipation: Secondary | ICD-10-CM | POA: Diagnosis not present

## 2023-09-07 DIAGNOSIS — R7989 Other specified abnormal findings of blood chemistry: Secondary | ICD-10-CM | POA: Diagnosis not present

## 2023-09-07 DIAGNOSIS — M25511 Pain in right shoulder: Secondary | ICD-10-CM | POA: Diagnosis not present

## 2023-09-07 DIAGNOSIS — M25562 Pain in left knee: Secondary | ICD-10-CM | POA: Diagnosis not present

## 2023-09-07 DIAGNOSIS — D6869 Other thrombophilia: Secondary | ICD-10-CM | POA: Diagnosis not present

## 2023-09-07 DIAGNOSIS — Z0000 Encounter for general adult medical examination without abnormal findings: Secondary | ICD-10-CM | POA: Diagnosis not present

## 2023-09-07 DIAGNOSIS — Z23 Encounter for immunization: Secondary | ICD-10-CM | POA: Diagnosis not present

## 2023-09-07 DIAGNOSIS — I350 Nonrheumatic aortic (valve) stenosis: Secondary | ICD-10-CM | POA: Diagnosis not present

## 2023-09-07 DIAGNOSIS — J209 Acute bronchitis, unspecified: Secondary | ICD-10-CM | POA: Diagnosis not present

## 2023-09-07 DIAGNOSIS — I82452 Acute embolism and thrombosis of left peroneal vein: Secondary | ICD-10-CM | POA: Diagnosis not present

## 2023-09-07 DIAGNOSIS — Z1331 Encounter for screening for depression: Secondary | ICD-10-CM | POA: Diagnosis not present

## 2023-09-07 DIAGNOSIS — Z1389 Encounter for screening for other disorder: Secondary | ICD-10-CM | POA: Diagnosis not present

## 2023-09-07 DIAGNOSIS — R29898 Other symptoms and signs involving the musculoskeletal system: Secondary | ICD-10-CM | POA: Diagnosis not present

## 2023-09-07 DIAGNOSIS — I25810 Atherosclerosis of coronary artery bypass graft(s) without angina pectoris: Secondary | ICD-10-CM | POA: Diagnosis not present

## 2023-09-07 DIAGNOSIS — E785 Hyperlipidemia, unspecified: Secondary | ICD-10-CM | POA: Diagnosis not present

## 2023-09-07 DIAGNOSIS — R82998 Other abnormal findings in urine: Secondary | ICD-10-CM | POA: Diagnosis not present

## 2023-09-07 DIAGNOSIS — R7301 Impaired fasting glucose: Secondary | ICD-10-CM | POA: Diagnosis not present

## 2023-09-07 DIAGNOSIS — G720 Drug-induced myopathy: Secondary | ICD-10-CM | POA: Diagnosis not present

## 2023-09-07 DIAGNOSIS — I700 Atherosclerosis of aorta: Secondary | ICD-10-CM | POA: Diagnosis not present

## 2023-09-07 DIAGNOSIS — Z1339 Encounter for screening examination for other mental health and behavioral disorders: Secondary | ICD-10-CM | POA: Diagnosis not present

## 2023-09-07 DIAGNOSIS — M48061 Spinal stenosis, lumbar region without neurogenic claudication: Secondary | ICD-10-CM | POA: Diagnosis not present

## 2023-09-11 DIAGNOSIS — I82409 Acute embolism and thrombosis of unspecified deep veins of unspecified lower extremity: Secondary | ICD-10-CM | POA: Diagnosis not present

## 2023-09-11 DIAGNOSIS — M7551 Bursitis of right shoulder: Secondary | ICD-10-CM | POA: Diagnosis not present

## 2023-09-11 DIAGNOSIS — M13831 Other specified arthritis, right wrist: Secondary | ICD-10-CM | POA: Diagnosis not present

## 2023-09-11 DIAGNOSIS — M6281 Muscle weakness (generalized): Secondary | ICD-10-CM | POA: Diagnosis not present

## 2023-09-11 DIAGNOSIS — M66821 Spontaneous rupture of other tendons, right upper arm: Secondary | ICD-10-CM | POA: Diagnosis not present

## 2023-09-11 DIAGNOSIS — G5603 Carpal tunnel syndrome, bilateral upper limbs: Secondary | ICD-10-CM | POA: Diagnosis not present

## 2023-09-12 ENCOUNTER — Other Ambulatory Visit (HOSPITAL_BASED_OUTPATIENT_CLINIC_OR_DEPARTMENT_OTHER): Payer: Self-pay

## 2023-09-12 MED ORDER — COMIRNATY 30 MCG/0.3ML IM SUSY
PREFILLED_SYRINGE | INTRAMUSCULAR | 0 refills | Status: DC
  Filled 2023-09-12: qty 0.3, 1d supply, fill #0

## 2023-09-13 DIAGNOSIS — M25552 Pain in left hip: Secondary | ICD-10-CM | POA: Diagnosis not present

## 2023-09-13 DIAGNOSIS — M545 Low back pain, unspecified: Secondary | ICD-10-CM | POA: Diagnosis not present

## 2023-09-13 DIAGNOSIS — M25562 Pain in left knee: Secondary | ICD-10-CM | POA: Diagnosis not present

## 2023-09-15 ENCOUNTER — Other Ambulatory Visit (HOSPITAL_BASED_OUTPATIENT_CLINIC_OR_DEPARTMENT_OTHER): Payer: Self-pay

## 2023-09-15 MED ORDER — FLURAZEPAM HCL 30 MG PO CAPS
30.0000 mg | ORAL_CAPSULE | Freq: Every day | ORAL | 0 refills | Status: DC
  Filled 2023-09-15: qty 60, 60d supply, fill #0
  Filled 2023-09-16: qty 30, 30d supply, fill #0
  Filled 2023-11-20: qty 30, 30d supply, fill #1
  Filled 2024-01-05: qty 30, 30d supply, fill #2

## 2023-09-16 ENCOUNTER — Other Ambulatory Visit (HOSPITAL_BASED_OUTPATIENT_CLINIC_OR_DEPARTMENT_OTHER): Payer: Self-pay

## 2023-09-18 DIAGNOSIS — M6281 Muscle weakness (generalized): Secondary | ICD-10-CM | POA: Diagnosis not present

## 2023-09-20 DIAGNOSIS — M6281 Muscle weakness (generalized): Secondary | ICD-10-CM | POA: Diagnosis not present

## 2023-09-22 DIAGNOSIS — M25562 Pain in left knee: Secondary | ICD-10-CM | POA: Diagnosis not present

## 2023-09-25 DIAGNOSIS — M25519 Pain in unspecified shoulder: Secondary | ICD-10-CM | POA: Diagnosis not present

## 2023-09-25 DIAGNOSIS — M6281 Muscle weakness (generalized): Secondary | ICD-10-CM | POA: Diagnosis not present

## 2023-09-25 DIAGNOSIS — M25511 Pain in right shoulder: Secondary | ICD-10-CM | POA: Diagnosis not present

## 2023-09-27 DIAGNOSIS — M6281 Muscle weakness (generalized): Secondary | ICD-10-CM | POA: Diagnosis not present

## 2023-10-02 DIAGNOSIS — M6281 Muscle weakness (generalized): Secondary | ICD-10-CM | POA: Diagnosis not present

## 2023-10-04 DIAGNOSIS — M6281 Muscle weakness (generalized): Secondary | ICD-10-CM | POA: Diagnosis not present

## 2023-10-06 DIAGNOSIS — M6281 Muscle weakness (generalized): Secondary | ICD-10-CM | POA: Diagnosis not present

## 2023-10-11 DIAGNOSIS — M25562 Pain in left knee: Secondary | ICD-10-CM | POA: Diagnosis not present

## 2023-10-12 ENCOUNTER — Ambulatory Visit (HOSPITAL_COMMUNITY): Payer: Medicare Other | Attending: Nurse Practitioner

## 2023-10-12 DIAGNOSIS — E785 Hyperlipidemia, unspecified: Secondary | ICD-10-CM | POA: Insufficient documentation

## 2023-10-12 DIAGNOSIS — I350 Nonrheumatic aortic (valve) stenosis: Secondary | ICD-10-CM | POA: Insufficient documentation

## 2023-10-12 DIAGNOSIS — I10 Essential (primary) hypertension: Secondary | ICD-10-CM | POA: Insufficient documentation

## 2023-10-12 LAB — ECHOCARDIOGRAM COMPLETE
AR max vel: 1.29 cm2
AV Area VTI: 1.35 cm2
AV Area mean vel: 1.26 cm2
AV Mean grad: 12 mm[Hg]
AV Peak grad: 22.3 mm[Hg]
Ao pk vel: 2.36 m/s
Area-P 1/2: 3.02 cm2
P 1/2 time: 353 ms
S' Lateral: 2.6 cm

## 2023-10-19 ENCOUNTER — Encounter: Payer: Self-pay | Admitting: Internal Medicine

## 2023-10-19 ENCOUNTER — Ambulatory Visit: Payer: Medicare Other | Attending: Internal Medicine | Admitting: Internal Medicine

## 2023-10-19 VITALS — BP 130/82 | HR 82 | Resp 16 | Ht 68.0 in | Wt 189.8 lb

## 2023-10-19 DIAGNOSIS — R9431 Abnormal electrocardiogram [ECG] [EKG]: Secondary | ICD-10-CM | POA: Diagnosis not present

## 2023-10-19 DIAGNOSIS — I25810 Atherosclerosis of coronary artery bypass graft(s) without angina pectoris: Secondary | ICD-10-CM | POA: Diagnosis not present

## 2023-10-19 DIAGNOSIS — Z951 Presence of aortocoronary bypass graft: Secondary | ICD-10-CM | POA: Insufficient documentation

## 2023-10-19 DIAGNOSIS — I517 Cardiomegaly: Secondary | ICD-10-CM | POA: Insufficient documentation

## 2023-10-19 NOTE — Progress Notes (Signed)
Cardiology Office Note:  .    Date:  10/19/2023  ID:  Nathan [name], DOB October 17, 1946, MRN [id number] PCP: Cleatis Polka., MD  [facility] Providers Cardiologist:  Lesleigh Noe, MD (Inactive)     CC: Transition to new cardiologist  History of Present Illness: .    Nathan [name] is a 77 y.o. male with a history of coronary artery disease status post coronary artery bypass graft (CABG- LIMA LAD, RIMA to LCX, SVG with unclear bypass) in 2003, hypertension, and hyperlipidemia, presents for a transition of care evaluation. T.  In 2023, the patient had discussions with his Dr. Katrinka Blazing about the possibility of Transthyretin Amyloid Cardiomyopathy (ATTR). He has been thought to have mild to moderate aortic stenosis with a slightly decreased stroke volume index and a mean gradient assessed of 12 mm Hg.  The patient has a history of deep vein thrombosis (DVT) and superficial venous reflux, managed by a hematologist. He is currently on Xarelto for this condition. Since having C-diff, the patient has had issues with multiple medications, including metoprolol, pravastatin, lisinopril, atorvastatin, and apixaban, all leading to rash. He also has an allergy to Zetia, which leads to diarrhea.  The patient is asymptomatic in regard to his coronary artery disease status post CABG. He has a history of statin myopathy and is currently on Repatha for cholesterol management.    Recently, the patient experienced a tendon rupture, which he describes as a total shock after being on an international trip. The patient's overall health has been a concern, with multiple health issues occurring one after another since the discovery of the first blood clot. Despite these challenges, the patient reports being able to do most activities, with the exception of lifting due to the recent tendon rupture.  Relevant histories: .  Social- former HS patient ROS: As per HPI.   Studies Reviewed: .   Cardiac  Studies & Procedures     STRESS TESTS  MYOCARDIAL PERFUSION IMAGING 07/15/2015  Narrative  Nuclear stress EF: 63%.  There was no ST segment deviation noted during stress.  The study is normal.  This is a low risk study.  The left ventricular ejection fraction is normal (55-65%).  Normal nuclear study with no prior scar or ischemia.   ECHOCARDIOGRAM  ECHOCARDIOGRAM COMPLETE 10/12/2023  Narrative ECHOCARDIOGRAM REPORT    Patient Name:   Nathan [name] Date of Exam: 10/12/2023 Medical Rec #:  [id number]     Height:       68.0 in Accession #:    [id number]    Weight:       190.0 lb Date of Birth:  10/10/1946    BSA:          2.000 m Patient Age:    61 years      BP:           120/71 mmHg Patient Gender: M             HR:           78 bpm. Exam Location:  [address]  Procedure: 2D Echo, 3D Echo, Cardiac Doppler, Color Doppler and Strain Analysis  Indications:    I35.0 Aortic Stenosis  History:        Patient has prior history of Echocardiogram examinations, most recent 10/12/2022. CAD, Prior CABG, DVT; Risk Factors:Hypertension and HLD.  Sonographer:    Clearence Ped RCS Referring Phys: [id number] Zachary George SWINYER  IMPRESSIONS   1. Left ventricular  ejection fraction, by estimation, is 65 to 70%. The left ventricle has normal function. There is moderate asymmetric left ventricular hypertrophy. Left ventricular diastolic parameters are consistent with Grade I diastolic dysfunction (impaired relaxation). Elevated left atrial pressure. 2. Right ventricular systolic function is normal. The right ventricular size is normal. There is normal pulmonary artery systolic pressure. 3. Mild mitral valve regurgitation. Moderate mitral annular calcification. 4. AV is thickened, calcified with restricted motion. Difficult to define the number of leaflets. Peak and mean gradients through the valve arer 22 and 12 mm HG respectively. AVA (VTI) is 1.35 cm2 Dimensionless index is 0.43 Overall  consistent with mild aortic stenosis. COmpared to previous echo, mean gradient is decreased (19 to 12 mm Hg) . Aortic valve regurgitation is not visualized. 5. The inferior vena cava is normal in size with greater than 50% respiratory variability, suggesting right atrial pressure of 3 mmHg.  Comparison(s): The left ventricular function is unchanged.  FINDINGS Left Ventricle: Left ventricular ejection fraction, by estimation, is 65 to 70%. The left ventricle has normal function. Global longitudinal strain performed but not reported based on interpreter judgement due to suboptimal tracking. The left ventricular internal cavity size was normal in size. There is moderate asymmetric left ventricular hypertrophy. Left ventricular diastolic parameters are consistent with Grade I diastolic dysfunction (impaired relaxation). Elevated left atrial pressure.  Right Ventricle: The right ventricular size is normal. Right vetricular wall thickness was not assessed. Right ventricular systolic function is normal. There is normal pulmonary artery systolic pressure. The tricuspid regurgitant velocity is 2.17 m/s, and with an assumed right atrial pressure of 3 mmHg, the estimated right ventricular systolic pressure is 21.8 mmHg.  Left Atrium: Left atrial size was normal in size.  Right Atrium: Right atrial size was normal in size.  Pericardium: There is no evidence of pericardial effusion.  Mitral Valve: There is moderate thickening of the mitral valve leaflet(s). There is mild calcification of the mitral valve leaflet(s). Moderate mitral annular calcification. Mild mitral valve regurgitation.  Tricuspid Valve: The tricuspid valve is normal in structure. Tricuspid valve regurgitation is mild.  Aortic Valve: AV is thickened, calcified with restricted motion. Difficult to define the number of leaflets. Peak and mean gradients through the valve arer 22 and 12 mm HG respectively. AVA (VTI) is 1.35 cm2 Dimensionless  index is 0.43 Overall consistent with mild aortic stenosis. COmpared to previous echo, mean gradient is decreased (19 to 12 mm Hg). Aortic valve regurgitation is not visualized. Aortic regurgitation PHT measures 353 msec. Aortic valve mean gradient measures 12.0 mmHg. Aortic valve peak gradient measures 22.3 mmHg. Aortic valve area, by VTI measures 1.35 cm.  Pulmonic Valve: The pulmonic valve was normal in structure. Pulmonic valve regurgitation is mild.  Aorta: The aortic root and ascending aorta are structurally normal, with no evidence of dilitation.  Venous: The inferior vena cava is normal in size with greater than 50% respiratory variability, suggesting right atrial pressure of 3 mmHg.  IAS/Shunts: No atrial level shunt detected by color flow Doppler.   LEFT VENTRICLE PLAX 2D LVIDd:         4.20 cm   Diastology LVIDs:         2.60 cm   LV e' medial:    4.68 cm/s LV PW:         1.00 cm   LV E/e' medial:  25.0 LV IVS:        1.60 cm   LV e' lateral:   9.68 cm/s LVOT diam:  2.00 cm   LV E/e' lateral: 12.1 LV SV:         62 LV SV Index:   31        2D Longitudinal Strain LVOT Area:     3.14 cm  2D Strain GLS (A2C):   -20.3 % 2D Strain GLS (A3C):   -14.5 % 2D Strain GLS (A4C):   -21.4 % 2D Strain GLS Avg:     -18.8 %  3D Volume EF: 3D EF:        55 % LV EDV:       112 ml LV ESV:       50 ml LV SV:        62 ml  RIGHT VENTRICLE RV Basal diam:  3.30 cm TAPSE (M-mode): 1.7 cm RVSP:           21.8 mmHg  LEFT ATRIUM             Index        RIGHT ATRIUM           Index LA diam:        4.20 cm 2.10 cm/m   RA Pressure: 3.00 mmHg LA Vol (A2C):   48.9 ml 24.45 ml/m  RA Area:     13.60 cm LA Vol (A4C):   46.7 ml 23.35 ml/m  RA Volume:   29.70 ml  14.85 ml/m LA Biplane Vol: 49.5 ml 24.75 ml/m AORTIC VALVE AV Area (Vmax):    1.29 cm AV Area (Vmean):   1.26 cm AV Area (VTI):     1.35 cm AV Vmax:           236.00 cm/s AV Vmean:          164.000 cm/s AV VTI:             0.458 m AV Peak Grad:      22.3 mmHg AV Mean Grad:      12.0 mmHg LVOT Vmax:         96.90 cm/s LVOT Vmean:        66.000 cm/s LVOT VTI:          0.197 m LVOT/AV VTI ratio: 0.43 AI PHT:            353 msec  AORTA Ao Root diam: 3.10 cm Ao Asc diam:  3.50 cm  MITRAL VALVE                TRICUSPID VALVE MV Area (PHT):              TR Peak grad:   18.8 mmHg MV Decel Time:              TR Vmax:        217.00 cm/s MV E velocity: 117.00 cm/s  Estimated RAP:  3.00 mmHg MV A velocity: 132.00 cm/s  RVSP:           21.8 mmHg MV E/A ratio:  0.89 SHUNTS Systemic VTI:  0.20 m Systemic Diam: 2.00 cm  Nathan Pates MD Electronically signed by Nathan Pates MD Signature Date/Time: 10/12/2023/4:03:33 PM    Final    MONITORS  LONG TERM MONITOR (3-14 DAYS) 11/08/2022  Narrative   Normal sinus rhythm with average heart rate 82 bpm.   9 episodes of supraventricular tachycardia with the longest lasting 8 beats at a relatively slow rate and 1 episode lasted 13 beats at 187 bpm.   PVC burden less than 1% with 1 instance  of trigeminy   No atrial fibrillation   Patch Wear Time:  10 days and 22 hours (2023-11-10T10:03:17-0500 to 2023-11-21T08:51:52-0500)  Patient had a min HR of 47 bpm, max HR of 187 bpm, and avg HR of 82 bpm. Predominant underlying rhythm was Sinus Rhythm. 1 run of Ventricular Tachycardia occurred lasting 6 beats with a max rate of 174 bpm (avg 164 bpm). 9 Supraventricular Tachycardia runs occurred, the run with the fastest interval lasting 13 beats with a max rate of 187 bpm, the longest lasting 8 beats with an avg rate of 94 bpm. Isolated SVEs were rare (<1.0%), SVE Couplets were rare (<1.0%), and SVE Triplets were rare (<1.0%). Isolated VEs were rare (<1.0%, 825), VE Triplets were rare (<1.0%, 1), and no VE Couplets were present. Ventricular Trigeminy was present.           Risk Assessment/Calculations:         Physical Exam:    VS:  BP 130/82 (BP Location: Left Arm,  Patient Position: Sitting, Cuff Size: Normal)   Pulse 82   Resp 16   Ht 5\' 8"  (1.727 m)   Wt 189 lb 12.8 oz (86.1 kg)   SpO2 97%   BMI 28.86 kg/m    Wt Readings from Last 3 Encounters:  10/19/23 189 lb 12.8 oz (86.1 kg)  08/02/23 190 lb (86.2 kg)  06/21/23 190 lb (86.2 kg)    Gen: no distress,   Neck: No JVD Cardiac: No Rubs or Gallops, Soft systolic murmur, RRR +2 radial pulses Respiratory: Clear to auscultation bilaterally, normal effort, normal  respiratory rate GI: Soft, nontender, non-distended  MS: trace left leg edema;  moves all extremities Integument: Skin feels warm Neuro:  At time of evaluation, alert and oriented to person/place/time/situation  Psych: Normal affect, patient feels well   ASSESSMENT AND PLAN: .    Coronary Artery Disease (CAD) status post Coronary Artery Bypass Graft (CABG) - Asymptomatic. Noted intolerance to multiple medications including beta blockers and statins. Currently managed with Repatha for cholesterol control. -Continue Repatha. 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 No previous testing for ATTR. -Order pyrophosphate scanning and immunofixation analysis for ATTR. - if positive CMR and genetic testing then follow up with be for tafamadis and silencer discussions  SVT -asymptomatic and resolved, no BB due to above  Hypertension - Managed with Benicar. -Continue Benicar.  Deep Vein Thrombosis (DVT) - Managed by hematologist with Xarelto. -Continue current management under hematologist.  Aortic Stenosis Mild to moderate severity. No symptoms of severe stenosis. -Monitor condition, echo in 2026  Musculoskeletal Issues Noted multiple surgeries and tendon rupture. Current issues with left leg disability and upcoming physical therapy for shoulder. -If ATTR is confirmed, consider trial of  medication for potential symptom relief. - if surgery is performed, recommend staining tissue for amyloidosis  -Follow-up in spring with Nathan [name] unless evidence of cardiac amyloidosis is found (he really enjoyed working with her)  Time Spent Directly with Patient:   I have spent a total of 54 minutes with the patient reviewing notes, imaging, EKGs, labs, surveying him for amyloid neuropathy symptoms and examining the patient as well as establishing an assessment and plan that was discussed personally with the patient. Discussed disease state education . Plan in collaboration with his PCP and Dr. Leonides Schanz, and we are doing immunofixation testing.   Riley Lam, MD FASE [facility] Cardiologist [place]  [facility]  [address] [place], [place] [address] [phone]  11:01 AM

## 2023-10-19 NOTE — Patient Instructions (Signed)
Medication Instructions:  Your physician recommends that you continue on your current medications as directed. Please refer to the Current Medication list given to you today.  *If you need a refill on your cardiac medications before your next appointment, please call your pharmacy*   Lab Work: NONE  If you have labs (blood work) drawn today and your tests are completely normal, you will receive your results only by: MyChart Message (if you have MyChart) OR A paper copy in the mail If you have any lab test that is abnormal or we need to change your treatment, we will call you to review the results.   Testing/Procedures: Your physician has requested that you have screening for Cardiac Amyloidosis.    Follow-Up: At [facility], you and your health needs are our priority.  As part of our continuing mission to provide you with exceptional heart care, we have created designated Provider Care Teams.  These Care Teams include your primary Cardiologist (physician) and Advanced Practice Providers (APPs -  Physician Assistants and Nurse Practitioners) who all work together to provide you with the care you need, when you need it.    Your next appointment:   5-6 month(s)  Provider:   Eligha Bridegroom, NP

## 2023-10-24 DIAGNOSIS — R9431 Abnormal electrocardiogram [ECG] [EKG]: Secondary | ICD-10-CM | POA: Diagnosis not present

## 2023-10-24 DIAGNOSIS — I517 Cardiomegaly: Secondary | ICD-10-CM | POA: Diagnosis not present

## 2023-10-24 DIAGNOSIS — M25511 Pain in right shoulder: Secondary | ICD-10-CM | POA: Diagnosis not present

## 2023-10-25 DIAGNOSIS — M75121 Complete rotator cuff tear or rupture of right shoulder, not specified as traumatic: Secondary | ICD-10-CM | POA: Diagnosis not present

## 2023-10-26 ENCOUNTER — Other Ambulatory Visit: Payer: Self-pay | Admitting: Nurse Practitioner

## 2023-10-26 DIAGNOSIS — E785 Hyperlipidemia, unspecified: Secondary | ICD-10-CM

## 2023-10-26 DIAGNOSIS — I25810 Atherosclerosis of coronary artery bypass graft(s) without angina pectoris: Secondary | ICD-10-CM

## 2023-10-27 ENCOUNTER — Inpatient Hospital Stay (HOSPITAL_BASED_OUTPATIENT_CLINIC_OR_DEPARTMENT_OTHER): Payer: Medicare Other | Admitting: Hematology and Oncology

## 2023-10-27 ENCOUNTER — Inpatient Hospital Stay: Payer: Medicare Other | Attending: Hematology and Oncology

## 2023-10-27 ENCOUNTER — Other Ambulatory Visit: Payer: Self-pay | Admitting: Hematology and Oncology

## 2023-10-27 VITALS — BP 148/79 | HR 84 | Temp 97.6°F | Resp 16 | Wt 192.1 lb

## 2023-10-27 DIAGNOSIS — Z86718 Personal history of other venous thrombosis and embolism: Secondary | ICD-10-CM | POA: Insufficient documentation

## 2023-10-27 DIAGNOSIS — I82462 Acute embolism and thrombosis of left calf muscular vein: Secondary | ICD-10-CM

## 2023-10-27 DIAGNOSIS — Z87891 Personal history of nicotine dependence: Secondary | ICD-10-CM | POA: Diagnosis not present

## 2023-10-27 DIAGNOSIS — Z7901 Long term (current) use of anticoagulants: Secondary | ICD-10-CM | POA: Diagnosis not present

## 2023-10-27 DIAGNOSIS — M25511 Pain in right shoulder: Secondary | ICD-10-CM | POA: Diagnosis not present

## 2023-10-27 LAB — CMP (CANCER CENTER ONLY)
ALT: 28 U/L (ref 0–44)
AST: 20 U/L (ref 15–41)
Albumin: 4.1 g/dL (ref 3.5–5.0)
Alkaline Phosphatase: 53 U/L (ref 38–126)
Anion gap: 3 — ABNORMAL LOW (ref 5–15)
BUN: 27 mg/dL — ABNORMAL HIGH (ref 8–23)
CO2: 30 mmol/L (ref 22–32)
Calcium: 9.2 mg/dL (ref 8.9–10.3)
Chloride: 105 mmol/L (ref 98–111)
Creatinine: 0.97 mg/dL (ref 0.61–1.24)
GFR, Estimated: >60 mL/min (ref >60)
Glucose, Bld: 99 mg/dL (ref 70–99)
Potassium: 4.6 mmol/L (ref 3.5–5.1)
Sodium: 138 mmol/L (ref 135–145)
Total Bilirubin: 0.4 mg/dL (ref <1.2)
Total Protein: 6.5 g/dL (ref 6.5–8.1)

## 2023-10-27 LAB — CBC WITH DIFFERENTIAL (CANCER CENTER ONLY)
Abs Immature Granulocytes: 0.02 10*3/uL (ref 0.00–0.07)
Basophils Absolute: 0 10*3/uL (ref 0.0–0.1)
Basophils Relative: 1 %
Eosinophils Absolute: 0.1 10*3/uL (ref 0.0–0.5)
Eosinophils Relative: 1 %
HCT: 38.8 % — ABNORMAL LOW (ref 39.0–52.0)
Hemoglobin: 12.8 g/dL — ABNORMAL LOW (ref 13.0–17.0)
Immature Granulocytes: 0 %
Lymphocytes Relative: 32 %
Lymphs Abs: 1.6 10*3/uL (ref 0.7–4.0)
MCH: 31.2 pg (ref 26.0–34.0)
MCHC: 33 g/dL (ref 30.0–36.0)
MCV: 94.6 fL (ref 80.0–100.0)
Monocytes Absolute: 0.7 10*3/uL (ref 0.1–1.0)
Monocytes Relative: 13 %
Neutro Abs: 2.7 10*3/uL (ref 1.7–7.7)
Neutrophils Relative %: 53 %
Platelet Count: 177 10*3/uL (ref 150–400)
RBC: 4.1 MIL/uL — ABNORMAL LOW (ref 4.22–5.81)
RDW: 12 % (ref 11.5–15.5)
WBC Count: 5.1 10*3/uL (ref 4.0–10.5)
nRBC: 0 % (ref 0.0–0.2)

## 2023-10-27 NOTE — Progress Notes (Signed)
[facility] Telephone:([phone]   Fax:([phone]) [phone]  PROGRESS NOTE  Patient Care Team: Cleatis Polka., MD as PCP - General (Internal Medicine) Lyn Records, MD (Inactive) as PCP - Cardiology (Cardiology) Ovidio Kin, MD as Consulting Physician (General Surgery)  Hematological/Oncological History # Left Lower Extremity DVT  02/28/2022: Lower Extremity US showed acute deep vein thrombosis involving the left common femoral vein, SF junction, left femoral vein, left proximal profunda vein, left popliteal vein, left posterior tibial veins, and left peroneal veins. Provoked by 14 hour flight.   10/28/2022: Lower Extremity US showed acute DVT in left peroneal veins.  11/30/2022: establish care with Dr. Leonides Schanz   Interval History:  Nathan [name] 77 y.o. male with medical history significant for unprovoked LLE DVT who presents for a follow up visit. The patient's last visit was on 04/26/2023. In the interim since the last visit he has continued on Xarelto therapy.   On exam today Nathan [name] reports he had a great trip to [place] in the interim since her last visit.  He went to [place] and the [place].  He also had a Villa in [place] for 2 weeks.  He reports that he took his Xarelto throughout the entire course.  He did have some swelling in his legs after each long flight, but they did improve.  He reports he also had a recent trip to [place] where similarly he had some swelling but no problem with them returning back to normal size.  There was no redness or pain.  He reports he did have a rupture of the supraspinatus tendon in his right arm.  He has been evaluated by orthopedic surgery who is considering a surgery to repair this.  He notes that he is also been talking with his cardiologist and there is some concern that he may have ATTR.  He is undergoing that evaluation now including a UPEP which showed no concerning abnormalities and an SPEP which is pending.  He reports that he  is able to forward his Xarelto without difficulty.  He caught some $10-$15 for a 14-month supply.  Overall he feels well and is willing and able to continue on Xarelto therapy at this time.  He otherwise denies any fevers, chills, sweats, nausea, vomiting or diarrhea.  A full 10 point ROS is otherwise negative.   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    Blood transfusion without reported diagnosis    Complication of anesthesia    jittery after surgery   Coronary artery disease    GERD (gastroesophageal reflux disease)    History of DVT (deep vein thrombosis)    Hyperlipemia    Hypertension    IFG (impaired fasting glucose)    Right carpal tunnel syndrome 02/08/2017   Seasonal allergies    Wears glasses     SURGICAL HISTORY: Past Surgical History:  Procedure Laterality Date   APPENDECTOMY     age 59   COLONOSCOPY     COLONOSCOPY WITH PROPOFOL N/A 02/15/2016   Procedure: COLONOSCOPY WITH PROPOFOL;  Surgeon: Charolett Bumpers, MD;  Location: [facility];  Service: Endoscopy;  Laterality: N/A;   CORONARY ARTERY BYPASS GRAFT     2003   ESOPHAGOGASTRODUODENOSCOPY (EGD) WITH PROPOFOL N/A 02/15/2016   Procedure: ESOPHAGOGASTRODUODENOSCOPY (EGD) WITH PROPOFOL;  Surgeon: Charolett Bumpers, MD;  Location: [facility];  Service: Endoscopy;  Laterality: N/A;  EXPLORATION POST OPERATIVE OPEN HEART     EYE SURGERY     detached retina-lt   INGUINAL HERNIA REPAIR Left 04/01/2014   Procedure: HERNIA REPAIR INGUINAL ADULT;  Surgeon: Kandis Cocking, MD;  Location: [facility];  Service: General;  Laterality: Left;   INSERTION OF MESH Left 04/01/2014   Procedure: INSERTION OF MESH;  Surgeon: Kandis Cocking, MD;  Location: [facility];  Service: General;  Laterality: Left;   KNEE ARTHROCENTESIS  1980   right   SHOULDER ACROMIOPLASTY  1985   rt   SINUS ENDO W/FUSION      TONSILLECTOMY AND ADENOIDECTOMY      SOCIAL HISTORY: Social History   Socioeconomic History   Marital status: Widowed    Spouse name: Not on file   Number of children: Not on file   Years of education: post grad   Highest education level: Not on file  Occupational History   Occupation: [employer]  Tobacco Use   Smoking status: Former    Current packs/day: 0.00    Types: Cigarettes    Quit date: 03/26/1969    Years since quitting: 54.6   Smokeless tobacco: Never  Vaping Use   Vaping status: Never Used  Substance and Sexual Activity   Alcohol use: Yes    Alcohol/week: 0.0 standard drinks of alcohol    Comment: 2 per day   Drug use: No   Sexual activity: Not Currently  Other Topics Concern   Not on file  Social History Narrative   Patient drinks 1-2 cups of caffeine daily.   Patient is left handed.    Social Determinants of Health   Financial Resource Strain: Not on file  Food Insecurity: Not on file  Transportation Needs: Not on file  Physical Activity: Not on file  Stress: Not on file  Social Connections: Not on file  Intimate Partner Violence: Not on file    FAMILY HISTORY: Family History  Problem Relation Age of Onset   Heart disease Mother    Cancer Father    Alcohol abuse Brother    Kidney failure Brother     ALLERGIES:  is allergic to [employer] hcl], lexiscan [regadenoson], triamcinolone acetonide acetate [triamcinolone], zetia [ezetimibe], amlodipine, hydromorphone, apixaban, lipitor [atorvastatin], lisinopril, neosporin [neomycin-bacitracin zn-polymyx], pravastatin, and toprol xl [metoprolol tartrate].  MEDICATIONS:  Current Outpatient Medications  Medication Sig Dispense Refill   cetirizine (ZYRTEC) 10 MG tablet Take 10 mg by mouth daily.     COVID-19 mRNA vaccine, Pfizer, (COMIRNATY) syringe Inject into the muscle. 0.3 mL 0   cycloSPORINE (RESTASIS) 0.05 % ophthalmic emulsion Place 1 drop into both eyes 2 (two) times daily.      Evolocumab  (REPATHA SURECLICK) 140 MG/ML SOAJ INJECT 140 MG INTO THE SKIN EVERY 14 (FOURTEEN) DAYS. 6 mL 3   flurazepam (DALMANE) 30 MG capsule Take 1 capsule (30 mg total) by mouth at bedtime. 90 capsule 0   gabapentin (NEURONTIN) 300 MG capsule Take 300 mg by mouth at bedtime.     GENTEAL 0.25-0.3 % GEL Apply to eye daily.     olmesartan (BENICAR) 20 MG tablet Take 10 mg by mouth daily. Take one half (0.5) tablet by mouth (10 mg) daily.     rivaroxaban (XARELTO) 10 MG TABS tablet Take 1 tablet (10 mg total) by mouth daily. 90 tablet 3   SYSTANE 0.4-0.3 % SOLN Apply to eye as needed.     triamcinolone (NASACORT) 55 MCG/ACT AERO nasal inhaler Place 2  sprays into the nose daily.     No current facility-administered medications for this visit.    REVIEW OF SYSTEMS:   Constitutional: ( - ) fevers, ( - )  chills , ( - ) night sweats Eyes: ( - ) blurriness of vision, ( - ) double vision, ( - ) watery eyes Ears, nose, mouth, throat, and face: ( - ) mucositis, ( - ) sore throat Respiratory: ( - ) cough, ( - ) dyspnea, ( - ) wheezes Cardiovascular: ( - ) palpitation, ( - ) chest discomfort, ( - ) lower extremity swelling Gastrointestinal:  ( - ) nausea, ( - ) heartburn, ( - ) change in bowel habits Skin: ( - ) abnormal skin rashes Lymphatics: ( - ) new lymphadenopathy, ( - ) easy bruising Neurological: ( - ) numbness, ( - ) tingling, ( - ) new weaknesses Behavioral/Psych: ( - ) mood change, ( - ) new changes  All other systems were reviewed with the patient and are negative.  PHYSICAL EXAMINATION: Vitals:   10/27/23 1027  BP: (!) 148/79  Pulse: 84  Resp: 16  Temp: 97.6 F (36.4 C)  SpO2: 97%    Filed Weights   10/27/23 1027  Weight: 192 lb 1.6 oz (87.1 kg)     GENERAL: well appearing elderly Caucasian male, alert, no distress and comfortable SKIN: skin color, texture, turgor are normal, no rashes or significant lesions EYES: conjunctiva are pink and non-injected, sclera clear LUNGS: clear  to auscultation and percussion with normal breathing effort HEART: regular rate & rhythm and no murmurs and no lower extremity edema Musculoskeletal: no cyanosis of digits and no clubbing  PSYCH: alert & oriented x 3, fluent speech NEURO: no focal motor/sensory deficits  LABORATORY DATA:  I have reviewed the data as listed    Latest Ref Rng & Units 10/27/2023   10:09 AM 05/30/2023   10:52 AM 04/26/2023    8:59 AM  CBC  WBC 4.0 - 10.5 K/uL 5.1  6.8  4.5   Hemoglobin 13.0 - 17.0 g/dL 78.4  69.6  29.5   Hematocrit 39.0 - 52.0 % 38.8  36.4  37.8   Platelets 150 - 400 K/uL 177  179  183        Latest Ref Rng & Units 10/27/2023   10:09 AM 10/24/2023    7:51 AM 05/30/2023   10:52 AM  CMP  Glucose 70 - 99 mg/dL 99   284   BUN 8 - 23 mg/dL 27   22   Creatinine 1.32 - 1.24 mg/dL 4.40   1.02   Sodium 725 - 145 mmol/L 138   139   Potassium 3.5 - 5.1 mmol/L 4.6   4.7   Chloride 98 - 111 mmol/L 105   106   CO2 22 - 32 mmol/L 30   29   Calcium 8.9 - 10.3 mg/dL 9.2   9.6   Total Protein 6.5 - 8.1 g/dL 6.5  WILL FOLLOW  P 6.2   Total Bilirubin <1.2 mg/dL 0.4   0.4   Alkaline Phos 38 - 126 U/L 53   42   AST 15 - 41 U/L 20   19   ALT 0 - 44 U/L 28   22     P Preliminary result    RADIOGRAPHIC STUDIES: ECHOCARDIOGRAM COMPLETE  Result Date: 10/12/2023    ECHOCARDIOGRAM REPORT   Patient Name:   AIYDAN STRABALA Date of Exam: 10/12/2023 Medical Rec #:  [id number]  Height:       68.0 in Accession #:    [id number]    Weight:       190.0 lb Date of Birth:  1946/10/06    BSA:          2.000 m Patient Age:    70 years      BP:           120/71 mmHg Patient Gender: M             HR:           78 bpm. Exam Location:  [address] Procedure: 2D Echo, 3D Echo, Cardiac Doppler, Color Doppler and Strain Analysis Indications:    I35.0 Aortic Stenosis  History:        Patient has prior history of Echocardiogram examinations, most                 recent 10/12/2022. CAD, Prior CABG, DVT; Risk                  Factors:Hypertension and HLD.  Sonographer:    Clearence Ped RCS Referring Phys: [id number] Zachary George SWINYER IMPRESSIONS  1. Left ventricular ejection fraction, by estimation, is 65 to 70%. The left ventricle has normal function. There is moderate asymmetric left ventricular hypertrophy. Left ventricular diastolic parameters are consistent with Grade I diastolic dysfunction (impaired relaxation). Elevated left atrial pressure.  2. Right ventricular systolic function is normal. The right ventricular size is normal. There is normal pulmonary artery systolic pressure.  3. Mild mitral valve regurgitation. Moderate mitral annular calcification.  4. AV is thickened, calcified with restricted motion. Difficult to define the number of leaflets. Peak and mean gradients through the valve arer 22 and 12 mm HG respectively. AVA (VTI) is 1.35 cm2 Dimensionless index is 0.43 Overall consistent with mild  aortic stenosis. COmpared to previous echo, mean gradient is decreased (19 to 12 mm Hg) . Aortic valve regurgitation is not visualized.  5. The inferior vena cava is normal in size with greater than 50% respiratory variability, suggesting right atrial pressure of 3 mmHg. Comparison(s): The left ventricular function is unchanged. FINDINGS  Left Ventricle: Left ventricular ejection fraction, by estimation, is 65 to 70%. The left ventricle has normal function. Global longitudinal strain performed but not reported based on interpreter judgement due to suboptimal tracking. The left ventricular internal cavity size was normal in size. There is moderate asymmetric left ventricular hypertrophy. Left ventricular diastolic parameters are consistent with Grade I diastolic dysfunction (impaired relaxation). Elevated left atrial pressure. Right Ventricle: The right ventricular size is normal. Right vetricular wall thickness was not assessed. Right ventricular systolic function is normal. There is normal pulmonary artery systolic pressure. The  tricuspid regurgitant velocity is 2.17 m/s, and with an assumed right atrial pressure of 3 mmHg, the estimated right ventricular systolic pressure is 21.8 mmHg. Left Atrium: Left atrial size was normal in size. Right Atrium: Right atrial size was normal in size. Pericardium: There is no evidence of pericardial effusion. Mitral Valve: There is moderate thickening of the mitral valve leaflet(s). There is mild calcification of the mitral valve leaflet(s). Moderate mitral annular calcification. Mild mitral valve regurgitation. Tricuspid Valve: The tricuspid valve is normal in structure. Tricuspid valve regurgitation is mild. Aortic Valve: AV is thickened, calcified with restricted motion. Difficult to define the number of leaflets. Peak and mean gradients through the valve arer 22 and 12 mm HG respectively. AVA (VTI) is 1.35 cm2 Dimensionless index is 0.43 Overall  consistent  with mild aortic stenosis. COmpared to previous echo, mean gradient is decreased (19 to 12 mm Hg). Aortic valve regurgitation is not visualized. Aortic regurgitation PHT measures 353 msec. Aortic valve mean gradient measures 12.0 mmHg. Aortic valve peak  gradient measures 22.3 mmHg. Aortic valve area, by VTI measures 1.35 cm. Pulmonic Valve: The pulmonic valve was normal in structure. Pulmonic valve regurgitation is mild. Aorta: The aortic root and ascending aorta are structurally normal, with no evidence of dilitation. Venous: The inferior vena cava is normal in size with greater than 50% respiratory variability, suggesting right atrial pressure of 3 mmHg. IAS/Shunts: No atrial level shunt detected by color flow Doppler.  LEFT VENTRICLE PLAX 2D LVIDd:         4.20 cm   Diastology LVIDs:         2.60 cm   LV e' medial:    4.68 cm/s LV PW:         1.00 cm   LV E/e' medial:  25.0 LV IVS:        1.60 cm   LV e' lateral:   9.68 cm/s LVOT diam:     2.00 cm   LV E/e' lateral: 12.1 LV SV:         62 LV SV Index:   31        2D Longitudinal Strain LVOT  Area:     3.14 cm  2D Strain GLS (A2C):   -20.3 %                          2D Strain GLS (A3C):   -14.5 %                          2D Strain GLS (A4C):   -21.4 %                          2D Strain GLS Avg:     -18.8 %                           3D Volume EF:                          3D EF:        55 %                          LV EDV:       112 ml                          LV ESV:       50 ml                          LV SV:        62 ml RIGHT VENTRICLE RV Basal diam:  3.30 cm TAPSE (M-mode): 1.7 cm RVSP:           21.8 mmHg LEFT ATRIUM             Index        RIGHT ATRIUM           Index LA diam:        4.20 cm 2.10 cm/m  RA Pressure: 3.00 mmHg LA Vol (A2C):   48.9 ml 24.45 ml/m  RA Area:     13.60 cm LA Vol (A4C):   46.7 ml 23.35 ml/m  RA Volume:   29.70 ml  14.85 ml/m LA Biplane Vol: 49.5 ml 24.75 ml/m  AORTIC VALVE AV Area (Vmax):    1.29 cm AV Area (Vmean):   1.26 cm AV Area (VTI):     1.35 cm AV Vmax:           236.00 cm/s AV Vmean:          164.000 cm/s AV VTI:            0.458 m AV Peak Grad:      22.3 mmHg AV Mean Grad:      12.0 mmHg LVOT Vmax:         96.90 cm/s LVOT Vmean:        66.000 cm/s LVOT VTI:          0.197 m LVOT/AV VTI ratio: 0.43 AI PHT:            353 msec  AORTA Ao Root diam: 3.10 cm Ao Asc diam:  3.50 cm MITRAL VALVE                TRICUSPID VALVE MV Area (PHT):              TR Peak grad:   18.8 mmHg MV Decel Time:              TR Vmax:        217.00 cm/s MV E velocity: 117.00 cm/s  Estimated RAP:  3.00 mmHg MV A velocity: 132.00 cm/s  RVSP:           21.8 mmHg MV E/A ratio:  0.89                             SHUNTS                             Systemic VTI:  0.20 m                             Systemic Diam: 2.00 cm Dietrich Pates MD Electronically signed by Dietrich Pates MD Signature Date/Time: 10/12/2023/4:03:33 PM    Final     ASSESSMENT & PLAN Nathan [name] 77 y.o. male with medical history significant for unprovoked LLE DVT who presents for a follow up visit.   After review of the  labs, review of the records, and discussion with the patient the patients findings are most consistent with recurrent DVTs, 1 provoked and 1 unprovoked.   A provoked venous thromboembolism (VTE) is one that has a clear inciting factor or event. Provoking factors include prolonged travel/immobility, surgery (particular abdominal or orthropedic), trauma,  and pregnancy/ estrogen containing birth control. After a detailed history and review of the records there is no clear provoking factor for this patient's VTE.  Patients with unprovoked VTEs have up to 25% recurrence after 5 years and 36% at 10 years, with 4% of these clots being fatal (BMJ?2019;366:l4363). Therefore the formal recommendation for unprovoked VTE's is lifelong anticoagulation, as the cause may not be transient or reversible. We recommend 6 months or full strength anticoagulation with a re-evaluation after that time.  The patient's will then have a choice  of maintenance dose DOAC (preferred, recommended), 81mg  ASA PO daily (non-preferred), or no further anticoagulation (not recommended).     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  Lupus anticoagulant panel would be altered by presence of blood thinner, will hold on this testing.   --recommend the patient continue xarelto 10 mg PO daily maintenance dose.  --patient denies any bleeding, bruising, or dark stools on this medication. It is well tolerated. No difficulties accessing/affording the medication  --labs today show white blood cell 5.1, hemoglobin 12.8, MCV 94.6, platelets 177 --RTC in 6 months' time with strict return precautions for overt signs of bleeding.   No orders of the defined types were placed in this encounter.  All questions were answered. The patient knows to call the clinic with any problems, questions or concerns.  A  total of more than 30 minutes were spent on this encounter with face-to-face time and non-face-to-face time, including preparing to see the patient, ordering tests and/or medications, counseling the patient and coordination of care as outlined above.   Ulysees Barns, MD Department of Hematology/Oncology [facility] Phone: [phone] Pager: [phone] Email: [email or web address]  10/27/2023 4:49 PM

## 2023-10-29 LAB — UPEP/UIFE/LIGHT CHAINS/TP, 24-HR UR
% BETA, Urine: 0 %
ALBUMIN, U: 100 %
ALPHA 1 URINE: 0 %
ALPHA-2-GLOBULIN, U: 0 %
Free Kappa Lt Chains,Ur: 6.37 mg/L (ref 1.17–86.46)
Free Lambda Lt Chains,Ur: 0.93 mg/L (ref 0.27–15.21)
GAMMA GLOBULIN URINE: 0 %
Kappa/Lambda Ratio,U: 6.85 (ref 1.83–14.26)
Protein, 24H Urine: 300 mg/(24.h) — ABNORMAL HIGH (ref 30–150)
Protein, Ur: 10 mg/dL

## 2023-10-29 LAB — MULTIPLE MYELOMA PANEL, SERUM
Albumin SerPl Elph-Mcnc: 4 g/dL (ref 2.9–4.4)
Albumin/Glob SerPl: 1.7 (ref 0.7–1.7)
Alpha 1: 0.2 g/dL (ref 0.0–0.4)
Alpha2 Glob SerPl Elph-Mcnc: 0.6 g/dL (ref 0.4–1.0)
B-Globulin SerPl Elph-Mcnc: 0.9 g/dL (ref 0.7–1.3)
Gamma Glob SerPl Elph-Mcnc: 0.8 g/dL (ref 0.4–1.8)
Globulin, Total: 2.5 g/dL (ref 2.2–3.9)
IgA/Immunoglobulin A, Serum: 227 mg/dL (ref 61–437)
IgG (Immunoglobin G), Serum: 785 mg/dL (ref 603–1613)
IgM (Immunoglobulin M), Srm: 162 mg/dL — ABNORMAL HIGH (ref 15–143)
Total Protein: 6.5 g/dL (ref 6.0–8.5)

## 2023-10-30 ENCOUNTER — Encounter: Payer: Self-pay | Admitting: Hematology and Oncology

## 2023-10-31 DIAGNOSIS — M25511 Pain in right shoulder: Secondary | ICD-10-CM | POA: Diagnosis not present

## 2023-10-31 DIAGNOSIS — M7551 Bursitis of right shoulder: Secondary | ICD-10-CM | POA: Diagnosis not present

## 2023-11-01 DIAGNOSIS — H35372 Puckering of macula, left eye: Secondary | ICD-10-CM | POA: Diagnosis not present

## 2023-11-01 DIAGNOSIS — H04123 Dry eye syndrome of bilateral lacrimal glands: Secondary | ICD-10-CM | POA: Diagnosis not present

## 2023-11-01 DIAGNOSIS — Z961 Presence of intraocular lens: Secondary | ICD-10-CM | POA: Diagnosis not present

## 2023-11-02 DIAGNOSIS — I82452 Acute embolism and thrombosis of left peroneal vein: Secondary | ICD-10-CM | POA: Diagnosis not present

## 2023-11-02 DIAGNOSIS — I10 Essential (primary) hypertension: Secondary | ICD-10-CM | POA: Diagnosis not present

## 2023-11-02 DIAGNOSIS — M25511 Pain in right shoulder: Secondary | ICD-10-CM | POA: Diagnosis not present

## 2023-11-02 DIAGNOSIS — M25562 Pain in left knee: Secondary | ICD-10-CM | POA: Diagnosis not present

## 2023-11-02 DIAGNOSIS — M75121 Complete rotator cuff tear or rupture of right shoulder, not specified as traumatic: Secondary | ICD-10-CM | POA: Diagnosis not present

## 2023-11-02 DIAGNOSIS — D6869 Other thrombophilia: Secondary | ICD-10-CM | POA: Diagnosis not present

## 2023-11-02 NOTE — Telephone Encounter (Signed)
Left message on patient's home voice mail as a reminder about his Amyloid Study on 11/03/23 at 12:30.

## 2023-11-03 ENCOUNTER — Ambulatory Visit (HOSPITAL_COMMUNITY): Payer: Medicare Other | Attending: Cardiology

## 2023-11-03 DIAGNOSIS — I517 Cardiomegaly: Secondary | ICD-10-CM | POA: Insufficient documentation

## 2023-11-03 DIAGNOSIS — R9431 Abnormal electrocardiogram [ECG] [EKG]: Secondary | ICD-10-CM | POA: Diagnosis not present

## 2023-11-03 LAB — MYOCARDIAL AMYLOID PLANAR & SPECT: H/CL Ratio: 1.84

## 2023-11-03 MED ORDER — TECHNETIUM TC 99M PYROPHOSPHATE
20.3000 | Freq: Once | INTRAVENOUS | Status: AC
Start: 2023-11-03 — End: 2023-11-03
  Administered 2023-11-03: 20.3 via INTRAVENOUS

## 2023-11-06 ENCOUNTER — Encounter: Payer: Self-pay | Admitting: Hematology and Oncology

## 2023-11-06 DIAGNOSIS — M25511 Pain in right shoulder: Secondary | ICD-10-CM | POA: Diagnosis not present

## 2023-11-07 DIAGNOSIS — M75121 Complete rotator cuff tear or rupture of right shoulder, not specified as traumatic: Secondary | ICD-10-CM | POA: Diagnosis not present

## 2023-11-07 NOTE — Telephone Encounter (Signed)
Pt came into the office concerned about results of amyloid scan.  Advised pt that Dr. Izora Ribas is not in the office this week.  Pt reports really needs a treatment plan d/t right shoulder complications.  Needs to have surgery but Dr. Ranell Patrick with [facility] will not perform until amyloid testing is complete and a plan in place.  Pt showed me his bandaged right shoulder.   Advised pt I can schedule an OV with Dr. Izora Ribas 11/13/23 at 10 am.  Sent results to Dr. Ranell Patrick with [facility].   Will send to MD to clarify type of bone scan needed.

## 2023-11-13 ENCOUNTER — Encounter: Payer: Self-pay | Admitting: Internal Medicine

## 2023-11-13 ENCOUNTER — Ambulatory Visit: Payer: Medicare Other | Attending: Internal Medicine | Admitting: Internal Medicine

## 2023-11-13 VITALS — BP 122/60 | HR 84 | Ht 68.0 in | Wt 191.0 lb

## 2023-11-13 DIAGNOSIS — E854 Organ-limited amyloidosis: Secondary | ICD-10-CM

## 2023-11-13 DIAGNOSIS — I25810 Atherosclerosis of coronary artery bypass graft(s) without angina pectoris: Secondary | ICD-10-CM | POA: Diagnosis not present

## 2023-11-13 DIAGNOSIS — I10 Essential (primary) hypertension: Secondary | ICD-10-CM | POA: Diagnosis not present

## 2023-11-13 DIAGNOSIS — I43 Cardiomyopathy in diseases classified elsewhere: Secondary | ICD-10-CM | POA: Diagnosis not present

## 2023-11-13 DIAGNOSIS — I350 Nonrheumatic aortic (valve) stenosis: Secondary | ICD-10-CM

## 2023-11-13 DIAGNOSIS — M898X9 Other specified disorders of bone, unspecified site: Secondary | ICD-10-CM

## 2023-11-13 MED ORDER — TAFAMIDIS 61 MG PO CAPS: 61.0000 mg | ORAL_CAPSULE | Freq: Every day | ORAL | 3 refills | Status: DC

## 2023-11-13 NOTE — Patient Instructions (Signed)
Medication Instructions:  Your physician has recommended you make the following change in your medication:  START: Tafamadis 61 mg by mouth once daily  *If you need a refill on your cardiac medications before your next appointment, please call your pharmacy*   Lab Work: NONE If you have labs (blood work) drawn today and your tests are completely normal, you will receive your results only by: MyChart Message (if you have MyChart) OR A paper copy in the mail If you have any lab test that is abnormal or we need to change your treatment, we will call you to review the results.   Testing/Procedures: Your physician has requested that you have a Bone Scan.  Your physician has requested that you complete Genetic Testing.  A Genetic testing kit will be sent to you in the mail.  Please complete and return as directed.    Follow-Up: At [facility], you and your health needs are our priority.  As part of our continuing mission to provide you with exceptional heart care, we have created designated Provider Care Teams.  These Care Teams include your primary Cardiologist (physician) and Advanced Practice Providers (APPs -  Physician Assistants and Nurse Practitioners) who all work together to provide you with the care you need, when you need it.     Your next appointment:   2 month(s)  Provider:   Riley Lam, MD

## 2023-11-13 NOTE — Progress Notes (Signed)
Cardiology Office Note:  .    Date:  11/13/2023  ID:  Nathan [name], DOB [birth year], MRN [id number] PCP: Nathan Polka., MD  [facility] Providers Cardiologist:  Nathan Noe, MD (Inactive)     CC: DOD: discussed testing  History of Present Illness: .    Nathan [name] is a 77 y.o. male with a history of coronary artery disease status post coronary artery bypass graft (CABG- LIMA LAD, RIMA to LCX, SVG with unclear bypass) in 2003, hypertension, and hyperlipidemia, presents for a transition of care evaluation. T. 2023: In 2023, the patient had discussions with his Dr. Katrinka [name] about the possibility of Transthyretin Amyloid Cardiomyopathy (ATTR). He has been thought to have mild to moderate aortic stenosis with a slightly decreased stroke volume index and a mean gradient assessed of 12 mm Hg. 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 The patient has been asymptomatic from the supraventricular tachycardia and coronary disease, despite previous intolerance to beta blockers and statins. The patient's DVT is managed by a hematologist, and the aortic stenosis is due for an echo evaluation in 2026. The patient is currently on Repatha for cholesterol management, with no plans for changes in this treatment.  The patient has reported a history of biceps tendon rupture, which raised concerns about potential amyloidosis. The patient has also reported pain in the shoulder and weakness in the knees, which have been persistent despite physical therapy.  No chest pain or pressure .  No SOB/DOE and no PND/Orthopnea.  No weight gain or leg swelling.  No palpitations or syncope.  He is a bit overwhelmed about all of his findings.   Relevant histories: .  Social- former HS patient ROS: As per HPI.   Studies Reviewed: .   Cardiac Studies & Procedures      STRESS TESTS  MYOCARDIAL PERFUSION IMAGING 07/15/2015  Narrative  Nuclear stress EF: 63%.  There was no ST segment deviation noted during stress.  The study is normal.  This is a low risk study.  The left ventricular ejection fraction is normal (55-65%).  Normal nuclear study with no prior scar or ischemia.   ECHOCARDIOGRAM  ECHOCARDIOGRAM COMPLETE 10/12/2023  Narrative ECHOCARDIOGRAM REPORT    Patient Name:   Nathan [name] Date of Exam: 10/12/2023 Medical Rec #:  [id number]     Height:       68.0 in Accession #:    [id number]    Weight:       190.0 lb Date of Birth:  09/02/1946    BSA:          2.000 m Patient Age:    78 years      BP:           120/71 mmHg Patient Gender: M             HR:           78 bpm. Exam Location:  [address]  Procedure: 2D Echo, 3D Echo, Cardiac Doppler, Color Doppler and Strain Analysis  Indications:    I35.0 Aortic Stenosis  History:        Patient has prior history of Echocardiogram examinations, most recent 10/12/2022. CAD, Prior CABG, DVT; Risk Factors:Hypertension and HLD.  Sonographer:    Clearence Ped RCS Referring Phys: [id number] Nathan [name]  IMPRESSIONS   1. Left ventricular ejection  fraction, by estimation, is 65 to 70%. The left ventricle has normal function. There is moderate asymmetric left ventricular hypertrophy. Left ventricular diastolic parameters are consistent with Grade I diastolic dysfunction (impaired relaxation). Elevated left atrial pressure. 2. Right ventricular systolic function is normal. The right ventricular size is normal. There is normal pulmonary artery systolic pressure. 3. Mild mitral valve regurgitation. Moderate mitral annular calcification. 4. AV is thickened, calcified with restricted motion. Difficult to define the number of leaflets. Peak and mean gradients through the valve arer 22 and 12 mm HG respectively. AVA (VTI) is 1.35 cm2 Dimensionless index is 0.43 Overall consistent with  mild aortic stenosis. COmpared to previous echo, mean gradient is decreased (19 to 12 mm Hg) . Aortic valve regurgitation is not visualized. 5. The inferior vena cava is normal in size with greater than 50% respiratory variability, suggesting right atrial pressure of 3 mmHg.  Comparison(s): The left ventricular function is unchanged.  FINDINGS Left Ventricle: Left ventricular ejection fraction, by estimation, is 65 to 70%. The left ventricle has normal function. Global longitudinal strain performed but not reported based on interpreter judgement due to suboptimal tracking. The left ventricular internal cavity size was normal in size. There is moderate asymmetric left ventricular hypertrophy. Left ventricular diastolic parameters are consistent with Grade I diastolic dysfunction (impaired relaxation). Elevated left atrial pressure.  Right Ventricle: The right ventricular size is normal. Right vetricular wall thickness was not assessed. Right ventricular systolic function is normal. There is normal pulmonary artery systolic pressure. The tricuspid regurgitant velocity is 2.17 m/s, and with an assumed right atrial pressure of 3 mmHg, the estimated right ventricular systolic pressure is 21.8 mmHg.  Left Atrium: Left atrial size was normal in size.  Right Atrium: Right atrial size was normal in size.  Pericardium: There is no evidence of pericardial effusion.  Mitral Valve: There is moderate thickening of the mitral valve leaflet(s). There is mild calcification of the mitral valve leaflet(s). Moderate mitral annular calcification. Mild mitral valve regurgitation.  Tricuspid Valve: The tricuspid valve is normal in structure. Tricuspid valve regurgitation is mild.  Aortic Valve: AV is thickened, calcified with restricted motion. Difficult to define the number of leaflets. Peak and mean gradients through the valve arer 22 and 12 mm HG respectively. AVA (VTI) is 1.35 cm2 Dimensionless index is 0.43  Overall consistent with mild aortic stenosis. COmpared to previous echo, mean gradient is decreased (19 to 12 mm Hg). Aortic valve regurgitation is not visualized. Aortic regurgitation PHT measures 353 msec. Aortic valve mean gradient measures 12.0 mmHg. Aortic valve peak gradient measures 22.3 mmHg. Aortic valve area, by VTI measures 1.35 cm.  Pulmonic Valve: The pulmonic valve was normal in structure. Pulmonic valve regurgitation is mild.  Aorta: The aortic root and ascending aorta are structurally normal, with no evidence of dilitation.  Venous: The inferior vena cava is normal in size with greater than 50% respiratory variability, suggesting right atrial pressure of 3 mmHg.  IAS/Shunts: No atrial level shunt detected by color flow Doppler.   LEFT VENTRICLE PLAX 2D LVIDd:         4.20 cm   Diastology LVIDs:         2.60 cm   LV e' medial:    4.68 cm/s LV PW:         1.00 cm   LV E/e' medial:  25.0 LV IVS:        1.60 cm   LV e' lateral:   9.68 cm/s LVOT diam:  2.00 cm   LV E/e' lateral: 12.1 LV SV:         62 LV SV Index:   31        2D Longitudinal Strain LVOT Area:     3.14 cm  2D Strain GLS (A2C):   -20.3 % 2D Strain GLS (A3C):   -14.5 % 2D Strain GLS (A4C):   -21.4 % 2D Strain GLS Avg:     -18.8 %  3D Volume EF: 3D EF:        55 % LV EDV:       112 ml LV ESV:       50 ml LV SV:        62 ml  RIGHT VENTRICLE RV Basal diam:  3.30 cm TAPSE (M-mode): 1.7 cm RVSP:           21.8 mmHg  LEFT ATRIUM             Index        RIGHT ATRIUM           Index LA diam:        4.20 cm 2.10 cm/m   RA Pressure: 3.00 mmHg LA Vol (A2C):   48.9 ml 24.45 ml/m  RA Area:     13.60 cm LA Vol (A4C):   46.7 ml 23.35 ml/m  RA Volume:   29.70 ml  14.85 ml/m LA Biplane Vol: 49.5 ml 24.75 ml/m AORTIC VALVE AV Area (Vmax):    1.29 cm AV Area (Vmean):   1.26 cm AV Area (VTI):     1.35 cm AV Vmax:           236.00 cm/s AV Vmean:          164.000 cm/s AV VTI:            0.458 m AV  Peak Grad:      22.3 mmHg AV Mean Grad:      12.0 mmHg LVOT Vmax:         96.90 cm/s LVOT Vmean:        66.000 cm/s LVOT VTI:          0.197 m LVOT/AV VTI ratio: 0.43 AI PHT:            353 msec  AORTA Ao Root diam: 3.10 cm Ao Asc diam:  3.50 cm  MITRAL VALVE                TRICUSPID VALVE MV Area (PHT):              TR Peak grad:   18.8 mmHg MV Decel Time:              TR Vmax:        217.00 cm/s MV E velocity: 117.00 cm/s  Estimated RAP:  3.00 mmHg MV A velocity: 132.00 cm/s  RVSP:           21.8 mmHg MV E/A ratio:  0.89 SHUNTS Systemic VTI:  0.20 m Systemic Diam: 2.00 cm  Dietrich Pates MD Electronically signed by Dietrich Pates MD Signature Date/Time: 10/12/2023/4:03:33 PM    Final    MONITORS  LONG TERM MONITOR (3-14 DAYS) 11/08/2022  Narrative   Normal sinus rhythm with average heart rate 82 bpm.   9 episodes of supraventricular tachycardia with the longest lasting 8 beats at a relatively slow rate and 1 episode lasted 13 beats at 187 bpm.   PVC burden less than 1% with 1 instance  of trigeminy   No atrial fibrillation   Patch Wear Time:  10 days and 22 hours (2023-11-10T10:03:17-0500 to 2023-11-21T08:51:52-0500)  Patient had a min HR of 47 bpm, max HR of 187 bpm, and avg HR of 82 bpm. Predominant underlying rhythm was Sinus Rhythm. 1 run of Ventricular Tachycardia occurred lasting 6 beats with a max rate of 174 bpm (avg 164 bpm). 9 Supraventricular Tachycardia runs occurred, the run with the fastest interval lasting 13 beats with a max rate of 187 bpm, the longest lasting 8 beats with an avg rate of 94 bpm. Isolated SVEs were rare (<1.0%), SVE Couplets were rare (<1.0%), and SVE Triplets were rare (<1.0%). Isolated VEs were rare (<1.0%, 825), VE Triplets were rare (<1.0%, 1), and no VE Couplets were present. Ventricular Trigeminy was present.     PYP SCAN  MYOCARDIAL AMYLOID PLANAR AND SPECT 11/03/2023  Narrative   Myocardial uptake was positive for radiotracer  uptake. The visual grade of myocardial uptake relative to the ribs was Grade 2 (Myocardial uptake equal to rib uptake).   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   Prior study not available for comparison.         Physical Exam:    VS:  There were no vitals taken for this visit.   Wt Readings from Last 3 Encounters:  10/27/23 192 lb 1.6 oz (87.1 kg)  10/19/23 189 lb 12.8 oz (86.1 kg)  08/02/23 190 lb (86.2 kg)    Gen: no distress,   Neck: No JVD Cardiac: No Rubs or Gallops, Soft systolic murmur, RRR +2 radial pulses Respiratory: Clear to auscultation bilaterally, normal effort, normal  respiratory rate GI: Soft, nontender, non-distended  MS: trace left leg edema;  moves all extremities Integument: Skin feels warm Neuro:  At time of evaluation, alert and oriented to person/place/time/situation  Psych: Normal affect, patient feels well   ASSESSMENT AND PLAN: .    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 Asymptomatic for congestive heart failure. Discussed tafamidis benefits, including improved survival rates  and potential to prevent disabling symptoms. No significant side effects expected. Lack of large RCTs for asymptomatic patients . 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 Discussed genetic testing to determine hereditary vs. wild type ATTR amyloidosis. Preferred Vutrisiran due to its study in cardiac amyloidosis. Both Vutrisiran and Eplontersen aim to stabilize protein deposition and improve neuropathic and muscle pain. Emphasized vitamin A supplementation during treatment. - Order genetic testing through [facility] with genetic counseling - Refer to Dr. Shon Millet, neurologist - Start vitamin A  supplementation with start - Follow-up after genetic test results to determine specific medication  Cardiac Risk stratification - low risk for cardiac complication during surgery   SVT -asymptomatic and resolved, no BB due to above  Coronary Artery Disease (CAD) status post Coronary Artery Bypass Graft (CABG) - Asymptomatic. Noted intolerance to multiple medications including beta blockers and statins. Currently managed with Repatha for cholesterol control. -continue aggressive cholesterol goal (LDL < 70)  Hypertension -Continue Benicar.  Deep Vein Thrombosis (DVT) - Managed by hematologist with Xarelto.  Aortic Stenosis - Mild to moderate severity. No symptoms of severe stenosis. - Echo in 2025  February with me  Time Spent Directly with Patient:   I have spent a total  of 53 minutes with the patient reviewing notes, imaging, EKGs, labs, and examining the patient as well as establishing an assessment and plan that was discussed personally with the patient. Discussed disease state education  and reviewed his PYP study with him as well as clinical trial data for tafamadis, eplontersen, and vutrisitran. Reviewed care and plan in collaboration with orthopedic surgery, pharmacy, and neurology referral    Riley Lam, MD FASE [facility] Cardiologist [facility]  [facility]  [address] [place], [address] [place], [place] [address] [phone]  8:02 AM

## 2023-11-14 ENCOUNTER — Telehealth: Payer: Self-pay | Admitting: *Deleted

## 2023-11-14 NOTE — Telephone Encounter (Signed)
   Pre-operative Risk Assessment    Patient Name: Nathan [name]  DOB: [birth year] MRN: [id number]  DATE OF LAST VISIT: 11/13/23 Nathan [name] DATE OF NEXT VISIT: 01/19/24 DR. [facility]    Request for Surgical Clearance    Procedure:   RIGHT REVERSE TOTAL SHOULDER ARTHROPLASTY  Date of Surgery:  Clearance TBD                                 Surgeon:  DR. Malon Kindle Surgeon's Group or Practice Name:  [facility] Phone number:  [phone] KERRI MAZE Fax number:  [phone]   Type of Clearance Requested:   - Medical  - Pharmacy:  Hold Rivaroxaban (Xarelto)     Type of Anesthesia:   CHOICE   Additional requests/questions:    [name]   11/14/2023, 6:15 PM

## 2023-11-14 NOTE — Addendum Note (Signed)
Addended by: Macie Burows on: 11/14/2023 11:41 AM   Modules accepted: Orders

## 2023-11-15 ENCOUNTER — Telehealth: Payer: Self-pay | Admitting: Pharmacist

## 2023-11-15 NOTE — Telephone Encounter (Signed)
Went to submit PA but looks like already submitted by [place].

## 2023-11-15 NOTE — Telephone Encounter (Signed)
PA denied, patient made aware. Will submit appeal. Need patient signature in consent form. Patient will come to office tomorrow morning to sign consent form.

## 2023-11-15 NOTE — Telephone Encounter (Addendum)
Patient called on Dec 3. vyndamax requires PA approval as per [facility]   PA submitted on [facility]  Key: [id number]

## 2023-11-15 NOTE — Telephone Encounter (Signed)
   Patient Name: Nathan [name]  DOB: Mar 28, 1946 MRN: [id number]  Primary Cardiologist: Lesleigh Noe, MD (Inactive)  Chart reviewed as part of pre-operative protocol coverage. Patient was recently seen by Dr. Izora Ribas earlier this week on 11/13/2023 at which time he was doing well from a cardiac standpoint with no chest pain, shortness of breath, acute CHF symptoms, palpitations, or syncope. He was started on Tafamidis for his cardiac amyloidosis. Per Dr. Everlean Patterson note in regards to cardiac risk stratification, he is "low risk for cardiac complication during surgery." In a prior note, Dr. Izora Ribas also recommended staining tissue for amyloidosis if surgery is performed.  In regards to Xarelto, he is on this for prior DVT which is managed by Hematology. Therefore, will defer recommendations for holding his Xarelto to Hematology.   I will route this recommendation to the requesting party via Epic fax function.  Please call with questions.  Corrin Parker, PA-C 11/15/2023, 8:28 AM

## 2023-11-16 ENCOUNTER — Encounter: Payer: Self-pay | Admitting: Neurology

## 2023-11-17 DIAGNOSIS — D489 Neoplasm of uncertain behavior, unspecified: Secondary | ICD-10-CM | POA: Diagnosis not present

## 2023-11-17 DIAGNOSIS — Z85828 Personal history of other malignant neoplasm of skin: Secondary | ICD-10-CM | POA: Diagnosis not present

## 2023-11-17 DIAGNOSIS — L820 Inflamed seborrheic keratosis: Secondary | ICD-10-CM | POA: Diagnosis not present

## 2023-11-17 DIAGNOSIS — L853 Xerosis cutis: Secondary | ICD-10-CM | POA: Diagnosis not present

## 2023-11-17 DIAGNOSIS — M25511 Pain in right shoulder: Secondary | ICD-10-CM | POA: Diagnosis not present

## 2023-11-17 DIAGNOSIS — C44519 Basal cell carcinoma of skin of other part of trunk: Secondary | ICD-10-CM | POA: Diagnosis not present

## 2023-11-17 DIAGNOSIS — L308 Other specified dermatitis: Secondary | ICD-10-CM | POA: Diagnosis not present

## 2023-11-17 DIAGNOSIS — L570 Actinic keratosis: Secondary | ICD-10-CM | POA: Diagnosis not present

## 2023-11-17 DIAGNOSIS — D1801 Hemangioma of skin and subcutaneous tissue: Secondary | ICD-10-CM | POA: Diagnosis not present

## 2023-11-17 DIAGNOSIS — D485 Neoplasm of uncertain behavior of skin: Secondary | ICD-10-CM | POA: Diagnosis not present

## 2023-11-17 DIAGNOSIS — D225 Melanocytic nevi of trunk: Secondary | ICD-10-CM | POA: Diagnosis not present

## 2023-11-20 ENCOUNTER — Other Ambulatory Visit (HOSPITAL_BASED_OUTPATIENT_CLINIC_OR_DEPARTMENT_OTHER): Payer: Self-pay

## 2023-11-21 ENCOUNTER — Ambulatory Visit (HOSPITAL_COMMUNITY)
Admission: RE | Admit: 2023-11-21 | Discharge: 2023-11-21 | Disposition: A | Discharge status: Home / Self Care | Payer: Medicare Other | Source: Ambulatory Visit | Attending: Internal Medicine | Admitting: Internal Medicine

## 2023-11-21 ENCOUNTER — Other Ambulatory Visit (HOSPITAL_BASED_OUTPATIENT_CLINIC_OR_DEPARTMENT_OTHER): Payer: Self-pay

## 2023-11-21 ENCOUNTER — Encounter (HOSPITAL_COMMUNITY)
Admission: RE | Admit: 2023-11-21 | Discharge: 2023-11-21 | Disposition: A | Discharge status: Home / Self Care | Payer: Medicare Other | Source: Ambulatory Visit | Attending: Internal Medicine | Admitting: Internal Medicine

## 2023-11-21 DIAGNOSIS — M898X9 Other specified disorders of bone, unspecified site: Secondary | ICD-10-CM | POA: Insufficient documentation

## 2023-11-21 DIAGNOSIS — R0781 Pleurodynia: Secondary | ICD-10-CM | POA: Diagnosis not present

## 2023-11-21 MED ORDER — TECHNETIUM TC 99M MEDRONATE IV KIT
22.0000 | PACK | Freq: Once | INTRAVENOUS | Status: AC | PRN
  Administered 2023-11-21: 22 via INTRAVENOUS

## 2023-11-22 ENCOUNTER — Other Ambulatory Visit (HOSPITAL_BASED_OUTPATIENT_CLINIC_OR_DEPARTMENT_OTHER): Payer: Self-pay

## 2023-11-22 DIAGNOSIS — Z23 Encounter for immunization: Secondary | ICD-10-CM | POA: Diagnosis not present

## 2023-11-22 MED ORDER — PREVNAR 20 0.5 ML IM SUSY
0.5000 mL | PREFILLED_SYRINGE | Freq: Once | INTRAMUSCULAR | 0 refills | Status: AC
  Filled 2023-11-22: qty 0.5, 1d supply, fill #0

## 2023-11-23 ENCOUNTER — Encounter: Payer: Self-pay | Admitting: Internal Medicine

## 2023-11-23 DIAGNOSIS — M25511 Pain in right shoulder: Secondary | ICD-10-CM | POA: Diagnosis not present

## 2023-11-23 NOTE — Telephone Encounter (Signed)
Vyndamax PA denied patient made aware.  Per Dr.Chandrasekhar may consider  acoramidis. I will review PA requirement for acoramidis.

## 2023-11-24 NOTE — Telephone Encounter (Signed)
Vyndamax is approved by patient's insurance call patient to inform N/A , LVM.

## 2023-11-24 NOTE — Telephone Encounter (Signed)
Spoke to patient will receive medication next week got call from pharmacy. States it will cost him $65/month and it is affordable.

## 2023-11-27 DIAGNOSIS — M25511 Pain in right shoulder: Secondary | ICD-10-CM | POA: Diagnosis not present

## 2023-11-28 DIAGNOSIS — M1712 Unilateral primary osteoarthritis, left knee: Secondary | ICD-10-CM | POA: Diagnosis not present

## 2023-11-29 DIAGNOSIS — M25511 Pain in right shoulder: Secondary | ICD-10-CM | POA: Diagnosis not present

## 2023-12-03 ENCOUNTER — Encounter: Payer: Self-pay | Admitting: Internal Medicine

## 2023-12-04 DIAGNOSIS — M1712 Unilateral primary osteoarthritis, left knee: Secondary | ICD-10-CM | POA: Diagnosis not present

## 2023-12-08 ENCOUNTER — Encounter: Payer: Self-pay | Admitting: Neurology

## 2023-12-11 DIAGNOSIS — M1712 Unilateral primary osteoarthritis, left knee: Secondary | ICD-10-CM | POA: Diagnosis not present

## 2023-12-12 DIAGNOSIS — M25511 Pain in right shoulder: Secondary | ICD-10-CM | POA: Diagnosis not present

## 2023-12-14 DIAGNOSIS — M25511 Pain in right shoulder: Secondary | ICD-10-CM | POA: Diagnosis not present

## 2023-12-18 ENCOUNTER — Encounter: Payer: Self-pay | Admitting: Internal Medicine

## 2023-12-18 DIAGNOSIS — E8582 Wild-type transthyretin-related (ATTR) amyloidosis: Secondary | ICD-10-CM | POA: Diagnosis not present

## 2023-12-18 DIAGNOSIS — I82452 Acute embolism and thrombosis of left peroneal vein: Secondary | ICD-10-CM | POA: Diagnosis not present

## 2023-12-18 DIAGNOSIS — G629 Polyneuropathy, unspecified: Secondary | ICD-10-CM | POA: Diagnosis not present

## 2023-12-18 DIAGNOSIS — D6869 Other thrombophilia: Secondary | ICD-10-CM | POA: Diagnosis not present

## 2023-12-18 DIAGNOSIS — M25562 Pain in left knee: Secondary | ICD-10-CM | POA: Diagnosis not present

## 2023-12-18 DIAGNOSIS — M25511 Pain in right shoulder: Secondary | ICD-10-CM | POA: Diagnosis not present

## 2023-12-19 ENCOUNTER — Ambulatory Visit (INDEPENDENT_AMBULATORY_CARE_PROVIDER_SITE_OTHER): Payer: Medicare Other | Admitting: Neurology

## 2023-12-19 ENCOUNTER — Telehealth: Payer: Self-pay | Admitting: Pharmacist

## 2023-12-19 ENCOUNTER — Encounter: Payer: Self-pay | Admitting: Neurology

## 2023-12-19 VITALS — BP 151/62 | HR 92 | Ht 68.0 in | Wt 192.0 lb

## 2023-12-19 DIAGNOSIS — E8582 Wild-type transthyretin-related (ATTR) amyloidosis: Secondary | ICD-10-CM

## 2023-12-19 DIAGNOSIS — G5603 Carpal tunnel syndrome, bilateral upper limbs: Secondary | ICD-10-CM | POA: Diagnosis not present

## 2023-12-19 DIAGNOSIS — M48061 Spinal stenosis, lumbar region without neurogenic claudication: Secondary | ICD-10-CM

## 2023-12-19 DIAGNOSIS — M25511 Pain in right shoulder: Secondary | ICD-10-CM | POA: Diagnosis not present

## 2023-12-19 NOTE — Telephone Encounter (Signed)
 Patient will come to the office today to sign Amvuttra start form.

## 2023-12-19 NOTE — Progress Notes (Signed)
 [facility] Clinic Note - Initial Visit   Date: 12/19/2023   Nathan [name] MRN: [id number] DOB: Oct 26, 1946   Dear Dr. Santo:  Thank you for your kind referral of Nathan [name] for consultation of amyloidosis. Although his history is well known to you, please allow us  to reiterate it for the purpose of our medical record. The patient was accompanied to the clinic by self.   Nathan [name] is a 78 y.o. 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   IMPRESSION/PLAN: Wild-type transthyrettin amyloidosis with cardiac, musculoskeletal, and neurological manifestation.  Based on his history and prior electrodiagnostic testing, he has bilateral (right > left) carpal tunnel syndrome, which can be an early manifestation of this disease.  He has seen Dr. Camella and recommended to wear a wrist brace.  His neurological exam does not disclose signs of neuropathy.  He has intact distal sensation, strength, and reflexes.  In fact, reflexes are slightly brisk and he has patchy sensory loss over the left L5 dermatome, which is caused by known lumbar canal stenosis (moderate at L2-3 and L4-5). He is followed by Dr. Joshua at [facility].  If he develops any new numbness/tingling in the feet, NCS/EMG of the legs can be performed to further investigate symptoms.  Because his genetic testing is negative, he does not qualify for vutrisiran  (Amvuttra ) or eplontersen [place]), as both are only FDA-approved for hereditary ATTR.  He had multiple questions, which I tried to answer to the best of my ability.    Return to clinic as needed  ------------------------------------------------------------- History of present illness: Patient was referred to see me for neurological evaluation for amyloidosis.  He was diagnosed with wild-type transthyrettin  amyloidosis very recently.  Genetic testing result have not yet uploaded to Epic, however, I was notified of these findings by Dr. Santo.  He has history of right biceps tendon rupture, multiple orthopaedic issues (right shoulder pain, left knee pain), and bilateral carpal tunnel syndrome.  He is more symptomatic in the right hand, where he has numbness involving the right thumb, index finger, and middle finger. He saw Dr. Camella and was recommended to wear a wrist brace at night. He has some numbness over the lateral lower left leg.  No numbness/tingling in the feet.  He has imbalance related to left knee pain and has been getting PT for this, as well as his shoulder.  He is scheduled to have right shoulder surgery, but is unsure whether to proceed with this or not.    Out-side paper records, electronic medical record, and images have been reviewed where available and summarized as:  NCS/EMG of the right arm 01/29/2017: Nerve conduction studies done on both upper extremities shows evidence of a mild right carpal tunnel syndrome, evidence of a borderline left carpal tunnel syndrome. EMG evaluation of the right upper extremity is within normal limits. There is no evidence of an overlying cervical radiculopathy.   MRI lumbar spine wo contrast 11/19/2022: 1. Transitional anatomy. Lumbarization of S1. Please correlate with imaging if any intervention is planned. 2. L2-L3 moderate spinal canal stenosis and mild bilateral neural foraminal narrowing. 3. L4-L5 mild-to-moderate spinal canal stenosis with moderate to severe left and moderate right neural foraminal narrowing. 4. L3-L4 mild spinal canal stenosis with moderate left and mild right neural foraminal narrowing. 5. L5-S1 mild spinal canal stenosis and mild left greater than right neural foraminal narrowing.  6. L1-L2 mild spinal canal stenosis. 7. Narrowing of the lateral recesses throughout the lumbar spine could affect the descending L2-S1  nerve roots.   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    Blood transfusion without reported diagnosis    Complication of anesthesia    jittery after surgery   Coronary artery disease    GERD (gastroesophageal reflux disease)    History of DVT (deep vein thrombosis)    Hyperlipemia    Hypertension    IFG (impaired fasting glucose)    Right carpal tunnel syndrome 02/08/2017   Seasonal allergies    Wears glasses     Past Surgical History:  Procedure Laterality Date   APPENDECTOMY     age 21   COLONOSCOPY     COLONOSCOPY WITH PROPOFOL  N/A 02/15/2016   Procedure: COLONOSCOPY WITH PROPOFOL ;  Surgeon: Gladis MARLA Louder, MD;  Location: [facility];  Service: Endoscopy;  Laterality: N/A;   CORONARY ARTERY BYPASS GRAFT     2003   ESOPHAGOGASTRODUODENOSCOPY (EGD) WITH PROPOFOL  N/A 02/15/2016   Procedure: ESOPHAGOGASTRODUODENOSCOPY (EGD) WITH PROPOFOL ;  Surgeon: Gladis MARLA Louder, MD;  Location: [facility];  Service: Endoscopy;  Laterality: N/A;   EXPLORATION POST OPERATIVE OPEN HEART     EYE SURGERY     detached retina-lt   INGUINAL HERNIA REPAIR Left 04/01/2014   Procedure: HERNIA REPAIR INGUINAL ADULT;  Surgeon: Alm VEAR Angle, MD;  Location: [facility];  Service: General;  Laterality: Left;   INSERTION OF MESH Left 04/01/2014   Procedure: INSERTION OF MESH;  Surgeon: Alm VEAR Angle, MD;  Location:  SURGERY CENTER;  Service: General;  Laterality: Left;   KNEE ARTHROCENTESIS  1980   right   SHOULDER ACROMIOPLASTY  1985   rt   SINUS ENDO W/FUSION     TONSILLECTOMY AND ADENOIDECTOMY       Medications:  Outpatient Encounter Medications as of 12/19/2023  Medication Sig   benzonatate (TESSALON) 100 MG capsule as needed for cough.   cetirizine (ZYRTEC) 10 MG tablet Take 10 mg by mouth daily.   cycloSPORINE (RESTASIS) 0.05 % ophthalmic emulsion Place 1 drop into both eyes 2  (two) times daily.    Evolocumab  (REPATHA  SURECLICK) 140 MG/ML SOAJ INJECT 140 MG INTO THE SKIN EVERY 14 (FOURTEEN) DAYS.   flurazepam (DALMANE ) 30 MG capsule Take 1 capsule (30 mg total) by mouth at bedtime.   gabapentin (NEURONTIN) 300 MG capsule Take 300 mg by mouth at bedtime.   GENTEAL 0.25-0.3 % GEL Apply to eye daily.   olmesartan (BENICAR) 20 MG tablet Take 10 mg by mouth daily. Take one half (0.5) tablet by mouth (10 mg) daily.   rivaroxaban  (XARELTO ) 10 MG TABS tablet Take 1 tablet (10 mg total) by mouth daily.   SYSTANE 0.4-0.3 % SOLN Apply to eye as needed.   Tafamidis  61 MG CAPS Take 1 capsule (61 mg total) by mouth daily.   traMADol  (ULTRAM ) 50 MG tablet Take 50 mg by mouth every 6 (six) hours.   triamcinolone  (NASACORT ) 55 MCG/ACT AERO nasal inhaler Place 2 sprays into the nose daily.   No facility-administered encounter medications on file as of 12/19/2023.    Allergies:  Allergies  Allergen Reactions   Bystolic [Nebivolol Hcl] Rash   Lexiscan  [Regadenoson ] Other (See Comments)    Sharp sternal pain   Triamcinolone  Acetonide Acetate [Triamcinolone ] Hives   Zetia  [Ezetimibe ] Diarrhea  GI effects - bloating, cramping, diarrhea on 5mg  and 10mg  dose   Amlodipine     Hydromorphone      Other Reaction(s): syncope after taking   Apixaban Hives, Itching and Rash    Other reaction(s): Not available   Lipitor [Atorvastatin ] Rash   Lisinopril  Nausea Only    GI upset   Neosporin [Neomycin-Bacitracin Zn-Polymyx] Rash   Pravastatin Rash   Toprol Xl [Metoprolol Tartrate] Rash    Family History: Family History  Problem Relation Age of Onset   Heart disease Mother    Congestive Heart Failure Mother    Cancer Father    Bladder Cancer Father    Alcohol  abuse Brother    Kidney failure Brother     Social History: Social History   Tobacco Use   Smoking status: Former    Current packs/day: 0.00    Types: Cigarettes    Quit date: 03/26/1969    Years since quitting: 54.7    Smokeless tobacco: Never  Vaping Use   Vaping status: Never Used  Substance Use Topics   Alcohol  use: Yes    Alcohol /week: 0.0 standard drinks of alcohol     Comment: 2 per day   Drug use: No   Social History   Social History Narrative   Patient drinks 1-2 cups of caffeine daily.   Patient is left handed.       Are you right handed or left handed? Left Handed   Are you currently employed ? 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 Yes   Who lives with you?    What type of home do you live in: 1 story or 2 story? Lives in a two story home        Vital Signs:  BP (!) 151/62 (Patient Position: Sitting)   Pulse 92   Ht 5' 8 (1.727 m)   Wt 192 lb (87.1 kg)   SpO2 98%   BMI 29.19 kg/m    Neurological Exam: MENTAL STATUS including orientation to time, place, person, recent and remote memory, attention span and concentration, language, and fund of knowledge is normal.  Speech is not dysarthric.  CRANIAL NERVES: II:  No visual field defects.     III-IV-VI: Pupils equal round and reactive to light.  Normal conjugate, extra-ocular eye movements in all directions of gaze.  No nystagmus.  No ptosis.   V:  Normal facial sensation.    VII:  Normal facial symmetry and movements.   VIII:  Normal hearing and vestibular function.   IX-X:  Normal palatal movement.   XI:  Normal shoulder shrug and head rotation.   XII:  Normal tongue strength and range of motion, no deviation or fasciculation.  MOTOR:  Right shoulder with reduced ROM.  No atrophy, fasciculations or abnormal movements.  No pronator drift.   Upper Extremity:  Right  Left  Deltoid  5/5   5/5   Biceps  5/5   5/5   Triceps  5/5   5/5   Wrist extensors  5/5   5/5   Wrist flexors  5/5   5/5   Finger extensors  5/5   5/5   Finger flexors  5/5   5/5   Dorsal interossei  5/5   5/5   Abductor pollicis  5/5   5/5   Tone (Ashworth scale)  0  0   Lower Extremity:  Right   Left  Hip flexors  5/5   5/5   Knee flexors  5/5   5/5   Knee extensors  5/5   5/5   Dorsiflexors  5/5   5/5   Plantarflexors  5/5   5/5   Toe extensors  5/5   5/5   Toe flexors  5/5   5/5   Tone (Ashworth scale)  0  0   MSRs:                                           Right        Left brachioradialis 2+  2+  biceps 2+  2+  triceps 2+  2+  patellar 3+  3+  ankle jerk 2+  2+  Hoffman no  no  plantar response down  down   SENSORY:  Temperature and pin prick reduced over the left lateral lower leg, otherwise, sensation intact throughout, including distally.  Vibration is intact throughout including great toe.   Romberg's sign absent.   COORDINATION/GAIT: Normal finger-to- nose-finger.  Intact rapid alternating movements bilaterally.  Gait is mildly wide-based, stable, unassisted.  He has some difficulty with tandem gait.    Total time spent reviewing records, interview, history/exam, documentation, and coordination of care on day of encounter:  45 min    Thank you for allowing me to participate in patient's care.  If I can answer any additional questions, I would be pleased to do so.    Sincerely,    Jaki Hammerschmidt K. Tobie, DO

## 2023-12-21 ENCOUNTER — Encounter: Payer: Self-pay | Admitting: Internal Medicine

## 2023-12-21 DIAGNOSIS — M25511 Pain in right shoulder: Secondary | ICD-10-CM | POA: Diagnosis not present

## 2023-12-25 NOTE — H&P (Signed)
 Patient's anticipated LOS is less than 2 midnights, meeting these requirements: - Younger than 44 - Lives within 1 hour of care - Has a competent adult at home to recover with post-op recover - NO history of  - Chronic pain requiring opiods  - Diabetes  - Coronary Artery Disease  - Heart failure  - Heart attack  - Stroke  - DVT/VTE  - Cardiac arrhythmia  - Respiratory Failure/COPD  - Renal failure  - Anemia  - Advanced Liver disease     Nathan [name] is an 78 y.o. male.    Chief Complaint: right shoulder pain  HPI: Pt is a 78 y.o. male complaining of right shoulder pain for multiple years. Pain had continually increased since the beginning. X-rays in the clinic show end-stage arthritic changes of the right shoulder. Pt has tried various conservative treatments which have failed to alleviate their symptoms, including injections and therapy. Various options are discussed with the patient. Risks, benefits and expectations were discussed with the patient. Patient understand the risks, benefits and expectations and wishes to proceed with surgery.   PCP:  Nathan [name]., MD  D/C Plans: Home  PMH: 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    Blood transfusion without reported diagnosis    Complication of anesthesia    jittery after surgery   Coronary artery disease    GERD (gastroesophageal reflux disease)    History of DVT (deep vein thrombosis)    Hyperlipemia    Hypertension    IFG (impaired fasting glucose)    Right carpal tunnel syndrome 02/08/2017   Seasonal allergies    Wears glasses     PSH: Past Surgical History:  Procedure Laterality Date   APPENDECTOMY     age 17   COLONOSCOPY     COLONOSCOPY WITH PROPOFOL  N/A 02/15/2016   Procedure: COLONOSCOPY WITH PROPOFOL ;  Surgeon: Nathan MARLA Louder, MD;  Location: [facility];  Service: Endoscopy;  Laterality: N/A;   CORONARY  ARTERY BYPASS GRAFT     2003   ESOPHAGOGASTRODUODENOSCOPY (EGD) WITH PROPOFOL  N/A 02/15/2016   Procedure: ESOPHAGOGASTRODUODENOSCOPY (EGD) WITH PROPOFOL ;  Surgeon: Nathan MARLA Louder, MD;  Location: [facility];  Service: Endoscopy;  Laterality: N/A;   EXPLORATION POST OPERATIVE OPEN HEART     EYE SURGERY     detached retina-lt   INGUINAL HERNIA REPAIR Left 04/01/2014   Procedure: HERNIA REPAIR INGUINAL ADULT;  Surgeon: Nathan VEAR Angle, MD;  Location: [facility];  Service: General;  Laterality: Left;   INSERTION OF MESH Left 04/01/2014   Procedure: INSERTION OF MESH;  Surgeon: Nathan VEAR Angle, MD;  Location: [facility];  Service: General;  Laterality: Left;   KNEE ARTHROCENTESIS  1980   right   SHOULDER ACROMIOPLASTY  1985   rt   SINUS ENDO W/FUSION     TONSILLECTOMY AND ADENOIDECTOMY      Social History:  reports that he quit smoking about 54 years ago. His smoking use included cigarettes. He has never used smokeless tobacco. He reports current alcohol  use. He reports that he does not use drugs. BMI: Estimated body mass index is 29.19 kg/m as calculated from the following:   Height as of 12/19/23: 5' 8 (1.727 m).   Weight as of 12/19/23: 87.1 kg.  Lab Results  Component Value Date   ALBUMIN 4.1 10/27/2023   Diabetes: Patient does not have a diagnosis of  diabetes.     Smoking Status:      Allergies:  Allergies  Allergen Reactions   Bystolic [Nebivolol Hcl] Rash   Lexiscan  [Regadenoson ] Other (See Comments)    Sharp sternal pain   Triamcinolone  Acetonide Acetate [Triamcinolone ] Hives   Zetia  [Ezetimibe ] Diarrhea    GI effects - bloating, cramping, diarrhea on 5mg  and 10mg  dose   Amlodipine     Hydromorphone      Other Reaction(s): syncope after taking   Apixaban Hives, Itching and Rash    Other reaction(s): Not available   Lipitor [Atorvastatin ] Rash   Lisinopril  Nausea Only    GI upset   Neosporin [Neomycin-Bacitracin Zn-Polymyx] Rash    Pravastatin Rash   Toprol Xl [Metoprolol Tartrate] Rash    Medications: No current facility-administered medications for this encounter.   Current Outpatient Medications  Medication Sig Dispense Refill   benzonatate (TESSALON) 100 MG capsule as needed for cough.     cetirizine (ZYRTEC) 10 MG tablet Take 10 mg by mouth daily.     cycloSPORINE (RESTASIS) 0.05 % ophthalmic emulsion Place 1 drop into both eyes 2 (two) times daily.      Evolocumab  (REPATHA  SURECLICK) 140 MG/ML SOAJ INJECT 140 MG INTO THE SKIN EVERY 14 (FOURTEEN) DAYS. 6 mL 3   flurazepam (DALMANE ) 30 MG capsule Take 1 capsule (30 mg total) by mouth at bedtime. 90 capsule 0   gabapentin (NEURONTIN) 300 MG capsule Take 300 mg by mouth at bedtime.     GENTEAL 0.25-0.3 % GEL Apply to eye daily.     olmesartan (BENICAR) 20 MG tablet Take 10 mg by mouth daily. Take one half (0.5) tablet by mouth (10 mg) daily.     rivaroxaban  (XARELTO ) 10 MG TABS tablet Take 1 tablet (10 mg total) by mouth daily. 90 tablet 3   SYSTANE 0.4-0.3 % SOLN Apply to eye as needed.     Tafamidis  61 MG CAPS Take 1 capsule (61 mg total) by mouth daily. 90 capsule 3   traMADol  (ULTRAM ) 50 MG tablet Take 50 mg by mouth every 6 (six) hours.     triamcinolone  (NASACORT ) 55 MCG/ACT AERO nasal inhaler Place 2 sprays into the nose daily.      No results found for this or any previous visit (from the past 48 hours). No results found.  ROS: Pain with rom of the right upper extremity  Physical Exam: Alert and oriented 77 y.o. male in no acute distress Cranial nerves 2-12 intact Cervical spine: full rom with no tenderness, nv intact distally Chest: active breath sounds bilaterally, no wheeze rhonchi or rales Heart: regular rate and rhythm, no murmur Abd: non tender non distended with active bowel sounds Hip is stable with rom  Right shoulder painful and weak rom Nv intact distally  Assessment/Plan Assessment: right shoulder cuff  arthropathy  Plan:  Patient will undergo a right reverse total shoulder by Dr. Kay at [facility] Risks benefits and expectations were discussed with the patient. Patient understand risks, benefits and expectations and wishes to proceed. Preoperative templating of the joint replacement has been completed, documented, and submitted to the Operating Room personnel in order to optimize intra-operative equipment management.   Arvella Fireman PA-C, [facility] is now [employer] [address], [address], [place], [place] [address] Phone: [phone] [email or web address] Facebook  [employer]

## 2023-12-26 DIAGNOSIS — M25511 Pain in right shoulder: Secondary | ICD-10-CM | POA: Diagnosis not present

## 2023-12-28 DIAGNOSIS — M25511 Pain in right shoulder: Secondary | ICD-10-CM | POA: Diagnosis not present

## 2023-12-28 NOTE — Patient Instructions (Addendum)
SURGICAL WAITING ROOM VISITATION  Patients having surgery or a procedure may have no more than 2 support people in the waiting area - these visitors may rotate.    Children under the age of 15 must have an adult with them who is not the patient.  Due to an increase in RSV and influenza rates and associated hospitalizations, children ages 63 and under may not visit patients in [facility] hospitals.  Visitors with respiratory illnesses are discouraged from visiting and should remain at home.  If the patient needs to stay at the hospital during part of their recovery, the visitor guidelines for inpatient rooms apply. Pre-op nurse will coordinate an appropriate time for 1 support person to accompany patient in pre-op.  This support person may not rotate.    Please refer to the [facility] website for the visitor guidelines for Inpatients (after your surgery is over and you are in a regular room).       Your procedure is scheduled on: 01/05/24   Report to [facility] Main Entrance    Report to admitting at 9:20 AM   Call this number if you have problems the morning of surgery [phone]   Do not eat food :After Midnight.   After Midnight you may have the following liquids until 8:50 AM DAY OF SURGERY  Water Non-Citrus Juices (without pulp, NO RED-Apple, White grape, White cranberry) Black Coffee (NO MILK/CREAM OR CREAMERS, sugar ok)  Clear Tea (NO MILK/CREAM OR CREAMERS, sugar ok) regular and decaf                             Plain Jell-O (NO RED)                                           Fruit ices (not with fruit pulp, NO RED)                                     Popsicles (NO RED)                                                               Sports drinks like Gatorade (NO RED)                The day of surgery:  Drink ONE (1) Pre-Surgery Clear Ensure 8:50 AM the morning of surgery. Drink in one sitting. Do not sip.  This drink was given to you during your hospital   pre-op appointment visit. Nothing else to drink after completing the  Pre-Surgery Clear Ensure      Oral Hygiene is also important to reduce your risk of infection.                                    Remember - BRUSH YOUR TEETH THE MORNING OF SURGERY WITH YOUR REGULAR TOOTHPASTE  DENTURES WILL BE REMOVED PRIOR TO SURGERY PLEASE DO NOT APPLY "Poly grip" OR ADHESIVES!!!   Stop all vitamins and  herbal supplements 7 days before surgery.   Take these medicines the morning of surgery with A SIP OF WATER: Cetirizine, Tylenol, Gabapentin, Tafamidis              Do not take Benicar the morning of surgery.             You may not have any metal on your body including hair pins, jewelry, and body piercing             Do not wear make-up, lotions, powders, perfumes/cologne, or deodorant              Men may shave face and neck.   Do not bring valuables to the hospital. [facility] IS NOT             RESPONSIBLE   FOR VALUABLES.   Contacts, glasses, dentures or bridgework may not be worn into surgery.   Bring small overnight bag day of surgery.   DO NOT BRING YOUR HOME MEDICATIONS TO THE HOSPITAL. PHARMACY WILL DISPENSE MEDICATIONS LISTED ON YOUR MEDICATION LIST TO YOU DURING YOUR ADMISSION IN THE HOSPITAL!    Patients discharged on the day of surgery will not be allowed to drive home.  Someone NEEDS to stay with you for the first 24 hours after anesthesia.   Special Instructions: Bring a copy of your healthcare power of attorney and living will documents the day of surgery if you haven't scanned them before.              Please read over the following fact sheets you were given: IF YOU HAVE QUESTIONS ABOUT YOUR PRE-OP INSTRUCTIONS PLEASE CALL [phone] [facility]   If you received a COVID test during your pre-op visit  it is requested that you wear a mask when out in public, stay away from anyone that may not be feeling well and notify your surgeon if you develop symptoms. If you test  positive for Covid or have been in contact with anyone that has tested positive in the last 10 days please notify you surgeon.      Pre-operative 5 CHG Bath Instructions   You can play a key role in reducing the risk of infection after surgery. Your skin needs to be as free of germs as possible. You can reduce the number of germs on your skin by washing with CHG (chlorhexidine gluconate) soap before surgery. CHG is an antiseptic soap that kills germs and continues to kill germs even after washing.   DO NOT use if you have an allergy to chlorhexidine/CHG or antibacterial soaps. If your skin becomes reddened or irritated, stop using the CHG and notify one of our RNs at [phone].   Please shower with the CHG soap starting 4 days before surgery using the following schedule:     Please keep in mind the following:  DO NOT shave, including legs and underarms, starting the day of your first shower.   You may shave your face at any point before/day of surgery.  Place clean sheets on your bed the day you start using CHG soap. Use a clean washcloth (not used since being washed) for each shower. DO NOT sleep with pets once you start using the CHG.   CHG Shower Instructions:  If you choose to wash your hair and private area, wash first with your normal shampoo/soap.  After you use shampoo/soap, rinse your hair and body thoroughly to remove shampoo/soap residue.  Turn the water OFF and apply about 3  tablespoons (45 ml) of CHG soap to a CLEAN washcloth.  Apply CHG soap ONLY FROM YOUR NECK DOWN TO YOUR TOES (washing for 3-5 minutes)  DO NOT use CHG soap on face, private areas, open wounds, or sores.  Pay special attention to the area where your surgery is being performed.  If you are having back surgery, having someone wash your back for you may be helpful. Wait 2 minutes after CHG soap is applied, then you may rinse off the CHG soap.  Pat dry with a clean towel  Put on clean clothes/pajamas   If  you choose to wear lotion, please use ONLY the CHG-compatible lotions on the back of this paper.     Additional instructions for the day of surgery: DO NOT APPLY any lotions, deodorants, cologne, or perfumes.   Put on clean/comfortable clothes.  Brush your teeth.  Ask your nurse before applying any prescription medications to the skin.      CHG Compatible Lotions   Aveeno Moisturizing lotion  Cetaphil Moisturizing Cream  Cetaphil Moisturizing Lotion  Clairol Herbal Essence Moisturizing Lotion, Dry Skin  Clairol Herbal Essence Moisturizing Lotion, Extra Dry Skin  Clairol Herbal Essence Moisturizing Lotion, Normal Skin  Curel Age Defying Therapeutic Moisturizing Lotion with Alpha Hydroxy  Curel Extreme Care Body Lotion  Curel Soothing Hands Moisturizing Hand Lotion  Curel Therapeutic Moisturizing Cream, Fragrance-Free  Curel Therapeutic Moisturizing Lotion, Fragrance-Free  Curel Therapeutic Moisturizing Lotion, Original Formula  Eucerin Daily Replenishing Lotion  Eucerin Dry Skin Therapy Plus Alpha Hydroxy Crme  Eucerin Dry Skin Therapy Plus Alpha Hydroxy Lotion  Eucerin Original Crme  Eucerin Original Lotion  Eucerin Plus Crme Eucerin Plus Lotion  Eucerin TriLipid Replenishing Lotion  Keri Anti-Bacterial Hand Lotion  Keri Deep Conditioning Original Lotion Dry Skin Formula Softly Scented  Keri Deep Conditioning Original Lotion, Fragrance Free Sensitive Skin Formula  Keri Lotion Fast Absorbing Fragrance Free Sensitive Skin Formula  Keri Lotion Fast Absorbing Softly Scented Dry Skin Formula  Keri Original Lotion  Keri Skin Renewal Lotion Keri Silky Smooth Lotion  Keri Silky Smooth Sensitive Skin Lotion  Nivea Body Creamy Conditioning Oil  Nivea Body Extra Enriched Lotion  Nivea Body Original Lotion  Nivea Body Sheer Moisturizing Lotion Nivea Crme  Nivea Skin Firming Lotion  NutraDerm 30 Skin Lotion  NutraDerm Skin Lotion  NutraDerm Therapeutic Skin Cream  NutraDerm  Therapeutic Skin Lotion  ProShield Protective Hand Cream   Incentive Spirometer (Watch this video at home: [email or web address])  An incentive spirometer is a tool that can help keep your lungs clear and active. This tool measures how well you are filling your lungs with each breath. Taking long deep breaths may help reverse or decrease the chance of developing breathing (pulmonary) problems (especially infection) following: A long period of time when you are unable to move or be active. BEFORE THE PROCEDURE  If the spirometer includes an indicator to show your best effort, your nurse or respiratory therapist will set it to a desired goal. If possible, sit up straight or lean slightly forward. Try not to slouch. Hold the incentive spirometer in an upright position. INSTRUCTIONS FOR USE  Sit on the edge of your bed if possible, or sit up as far as you can in bed or on a chair. Hold the incentive spirometer in an upright position. Breathe out normally. Place the mouthpiece in your mouth and seal your lips tightly around it. Breathe in slowly and as deeply as possible, raising the piston or the ball toward  the top of the column. Hold your breath for 3-5 seconds or for as long as possible. Allow the piston or ball to fall to the bottom of the column. Remove the mouthpiece from your mouth and breathe out normally. Rest for a few seconds and repeat Steps 1 through 7 at least 10 times every 1-2 hours when you are awake. Take your time and take a few normal breaths between deep breaths. The spirometer may include an indicator to show your best effort. Use the indicator as a goal to work toward during each repetition. After each set of 10 deep breaths, practice coughing to be sure your lungs are clear. If you have an incision (the cut made at the time of surgery), support your incision when coughing by placing a pillow or rolled up towels firmly against it. Once you are able to  get out of bed, walk around indoors and cough well. You may stop using the incentive spirometer when instructed by your caregiver.  RISKS AND COMPLICATIONS Take your time so you do not get dizzy or light-headed. If you are in pain, you may need to take or ask for pain medication before doing incentive spirometry. It is harder to take a deep breath if you are having pain. AFTER USE Rest and breathe slowly and easily. It can be helpful to keep track of a log of your progress. Your caregiver can provide you with a simple table to help with this. If you are using the spirometer at home, follow these instructions: SEEK MEDICAL CARE IF:  You are having difficultly using the spirometer. You have trouble using the spirometer as often as instructed. Your pain medication is not giving enough relief while using the spirometer. You develop fever of 100.5 F (38.1 C) or higher. SEEK IMMEDIATE MEDICAL CARE IF:  You cough up bloody sputum that had not been present before. You develop fever of 102 F (38.9 C) or greater. You develop worsening pain at or near the incision site. MAKE SURE YOU:  Understand these instructions. Will watch your condition. Will get help right away if you are not doing well or get worse. Document Released: 04/10/2007 Document Revised: 02/20/2012 Document Reviewed: 06/11/2007 [facility] Patient Information 2014 [place], [place]- Preparing for Total Shoulder Arthroplasty    Before surgery, you can play an important role. Because skin is not sterile, your skin needs to be as free of germs as possible. You can reduce the number of germs on your skin by using the following products. Benzoyl Peroxide Gel Reduces the number of germs present on the skin Applied twice a day to shoulder area starting two days before surgery    ==================================================================  Please follow these instructions carefully:  BENZOYL PEROXIDE 5% GEL  Please  do not use if you have an allergy to benzoyl peroxide.   If your skin becomes reddened/irritated stop using the benzoyl peroxide.  Starting two days before surgery, apply as follows: Apply benzoyl peroxide in the morning and at night. Apply after taking a shower. If you are not taking a shower clean entire shoulder front, back, and side along with the armpit with a clean wet washcloth.  Place a quarter-sized dollop on your shoulder and rub in thoroughly, making sure to cover the front, back, and side of your shoulder, along with the armpit.   2 days before ____ AM   ____ PM              1 day before ____ AM  ____ PM                         Do this twice a day for two days.  (Last application is the night before surgery, AFTER using the CHG soap as described below).  Do NOT apply benzoyl peroxide gel on the day of surgery.

## 2023-12-28 NOTE — Progress Notes (Addendum)
COVID Vaccine received:  []  No []  Yes Date of any COVID positive Test in last 90 days:  PCP - Ninfa Linden MD Cardiologist - Verdis Prime MD  Cardiac clearance- Marjie Skiff PA-C 11/15/23  Chest x-ray -  EKG -  10/19/23 Epic Stress Test -  ECHO - 10/12/23 Epic Cardiac Cath -   Bowel Prep - []  No  []   Yes ______  Pacemaker / ICD device []  No []  Yes   Spinal Cord Stimulator:[]  No []  Yes       History of Sleep Apnea? []  No []  Yes   CPAP used?- []  No []  Yes    Does the patient monitor blood sugar?          []  No []  Yes  []  N/A  Patient has: []  NO Hx DM   []  Pre-DM                 []  DM1  []   DM2 Does patient have a [employer] or Dexacom? []  No []  Yes   Fasting Blood Sugar Ranges-  Checks Blood Sugar _____ times a day  GLP1 agonist / usual dose -  GLP1 instructions:  SGLT-2 inhibitors / usual dose -  SGLT-2 instructions:   Blood Thinner / Instructions: Aspirin Instructions:  Comments:   Activity level: Patient is able / unable to climb a flight of stairs without difficulty; []  No CP  []  No SOB, but would have ___   Patient can / can not perform ADLs without assistance.   Anesthesia review:   Patient denies shortness of breath, fever, cough and chest pain at PAT appointment.  Patient verbalized understanding and agreement to the Pre-Surgical Instructions that were given to them at this PAT appointment. Patient was also educated of the need to review these PAT instructions again prior to his/her surgery.I reviewed the appropriate phone numbers to call if they have any and questions or concerns.

## 2023-12-29 ENCOUNTER — Encounter (HOSPITAL_COMMUNITY)
Admission: RE | Admit: 2023-12-29 | Discharge: 2023-12-29 | Disposition: A | Discharge status: Home / Self Care | Payer: Medicare Other | Source: Ambulatory Visit | Attending: Anesthesiology | Admitting: Anesthesiology

## 2023-12-29 ENCOUNTER — Encounter (HOSPITAL_COMMUNITY): Payer: Self-pay

## 2023-12-29 ENCOUNTER — Encounter: Payer: Self-pay | Admitting: Internal Medicine

## 2023-12-29 ENCOUNTER — Ambulatory Visit: Payer: Medicare Other | Attending: Internal Medicine | Admitting: Internal Medicine

## 2023-12-29 VITALS — BP 128/84 | HR 86 | Resp 16 | Ht 68.0 in | Wt 191.2 lb

## 2023-12-29 DIAGNOSIS — E854 Organ-limited amyloidosis: Secondary | ICD-10-CM | POA: Diagnosis not present

## 2023-12-29 DIAGNOSIS — I350 Nonrheumatic aortic (valve) stenosis: Secondary | ICD-10-CM | POA: Diagnosis not present

## 2023-12-29 DIAGNOSIS — I25810 Atherosclerosis of coronary artery bypass graft(s) without angina pectoris: Secondary | ICD-10-CM | POA: Diagnosis not present

## 2023-12-29 DIAGNOSIS — I43 Cardiomyopathy in diseases classified elsewhere: Secondary | ICD-10-CM | POA: Insufficient documentation

## 2023-12-29 DIAGNOSIS — Z01818 Encounter for other preprocedural examination: Secondary | ICD-10-CM

## 2023-12-29 DIAGNOSIS — I10 Essential (primary) hypertension: Secondary | ICD-10-CM

## 2023-12-29 NOTE — Patient Instructions (Signed)
Medication Instructions:  Your physician recommends that you continue on your current medications as directed. Please refer to the Current Medication list given to you today.  *If you need a refill on your cardiac medications before your next appointment, please call your pharmacy*   Lab Work: BMP, BNP  If you have labs (blood work) drawn today and your tests are completely normal, you will receive your results only by: MyChart Message (if you have MyChart) OR A paper copy in the mail If you have any lab test that is abnormal or we need to change your treatment, we will call you to review the results.   Testing/Procedures: NONE   Follow-Up: At [facility], you and your health needs are our priority.  As part of our continuing mission to provide you with exceptional heart care, we have created designated Provider Care Teams.  These Care Teams include your primary Cardiologist (physician) and Advanced Practice Providers (APPs -  Physician Assistants and Nurse Practitioners) who all work together to provide you with the care you need, when you need it.   Your next appointment:   4 month(s)  Provider:   Riley Lam, MD

## 2023-12-29 NOTE — Progress Notes (Signed)
Cardiology Office Note:  .    Date:  12/29/2023  ID:  [name], DOB [birth year], MRN [id number] PCP: Cleatis Polka., MD  [facility] Providers Cardiologist:  Lesleigh Noe, MD (Inactive)     CC: Questions about testing  History of Present Illness: .    Nathan [name] is a 78 y.o. male with a history of coronary artery disease status post coronary artery bypass graft (CABG- LIMA LAD, RIMA to LCX, SVG with unclear bypass) in 2003, hypertension, and hyperlipidemia, presents for a transition of care evaluation. T. 2023: In 2023, the patient had discussions with his Dr. Katrinka Blazing about the possibility of Transthyretin Amyloid Cardiomyopathy (ATTR). He has been thought to have mild to moderate aortic stenosis with a slightly decreased stroke volume index and a mean gradient assessed of 12 mm Hg. 2024: evidence of ATTR on testing  Mr. 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 The patient's amyloidosis was initially suspected to have a peripheral neuropathy component, but after evaluation by a neurologist, this was ruled out. 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 Consequently, the patient did not start on Amvuttra or Eplonteri.  The patient's surgery was mutually cancelled, due to recovery and lack of pain.   The patient has been experiencing swelling in the legs, which is a new symptom. The swelling is more pronounced in the right leg, which is contrary to the patient's history of left leg issues related to a previous blood clot. The patient also reported a patch of numbness in the leg.  After neurology evaluation, this was thought to not be neuropathic in nature.   The patient expressed some confusion about the function of the prescribed medication, tafamidis, and its role in managing the  symptoms of ATTR cardiac amyloidosis.  The patient expressed a desire to understand more about the pathophysiology of wild type forms of cardiac amyloidosis and amyloidosis in general. The patient is not currently seeking a second opinion or interested in participating in clinical trials. The patient's primary concern is the recent onset of leg swelling and the potential implications for his cardiac health.  Relevant histories: .  Social- former HS patient; has a [employer] endowment ROS: As per HPI.   Studies Reviewed: .   Cardiac Studies & Procedures     STRESS TESTS  MYOCARDIAL PERFUSION IMAGING 07/15/2015  Narrative  Nuclear stress EF: 63%.  There was no ST segment deviation noted during stress.  The study is normal.  This is a low risk study.  The left ventricular ejection fraction is normal (55-65%).  Normal nuclear study with no prior scar or ischemia.  ECHOCARDIOGRAM  ECHOCARDIOGRAM COMPLETE 10/12/2023  Narrative ECHOCARDIOGRAM REPORT    Patient Name:   Nathan [name] Date of Exam: 10/12/2023 Medical Rec #:  [id number]     Height:       68.0 in Accession #:    [id number]    Weight:       190.0 lb Date of Birth:  16-Jul-1946    BSA:          2.000 m Patient Age:    76 years      BP:           120/71 mmHg Patient Gender: M             HR:  78 bpm. Exam Location:  [address]  Procedure: 2D Echo, 3D Echo, Cardiac Doppler, Color Doppler and Strain Analysis  Indications:    I35.0 Aortic Stenosis  History:        Patient has prior history of Echocardiogram examinations, most recent 10/12/2022. CAD, Prior CABG, DVT; Risk Factors:Hypertension and HLD.  Sonographer:    Clearence Ped RCS Referring Phys: [id number] Zachary George SWINYER  IMPRESSIONS   1. Left ventricular ejection fraction, by estimation, is 65 to 70%. The left ventricle has normal function. There is moderate asymmetric left ventricular hypertrophy. Left ventricular diastolic parameters are consistent  with Grade I diastolic dysfunction (impaired relaxation). Elevated left atrial pressure. 2. Right ventricular systolic function is normal. The right ventricular size is normal. There is normal pulmonary artery systolic pressure. 3. Mild mitral valve regurgitation. Moderate mitral annular calcification. 4. AV is thickened, calcified with restricted motion. Difficult to define the number of leaflets. Peak and mean gradients through the valve arer 22 and 12 mm HG respectively. AVA (VTI) is 1.35 cm2 Dimensionless index is 0.43 Overall consistent with mild aortic stenosis. COmpared to previous echo, mean gradient is decreased (19 to 12 mm Hg) . Aortic valve regurgitation is not visualized. 5. The inferior vena cava is normal in size with greater than 50% respiratory variability, suggesting right atrial pressure of 3 mmHg.  Comparison(s): The left ventricular function is unchanged.  FINDINGS Left Ventricle: Left ventricular ejection fraction, by estimation, is 65 to 70%. The left ventricle has normal function. Global longitudinal strain performed but not reported based on interpreter judgement due to suboptimal tracking. The left ventricular internal cavity size was normal in size. There is moderate asymmetric left ventricular hypertrophy. Left ventricular diastolic parameters are consistent with Grade I diastolic dysfunction (impaired relaxation). Elevated left atrial pressure.  Right Ventricle: The right ventricular size is normal. Right vetricular wall thickness was not assessed. Right ventricular systolic function is normal. There is normal pulmonary artery systolic pressure. The tricuspid regurgitant velocity is 2.17 m/s, and with an assumed right atrial pressure of 3 mmHg, the estimated right ventricular systolic pressure is 21.8 mmHg.  Left Atrium: Left atrial size was normal in size.  Right Atrium: Right atrial size was normal in size.  Pericardium: There is no evidence of pericardial  effusion.  Mitral Valve: There is moderate thickening of the mitral valve leaflet(s). There is mild calcification of the mitral valve leaflet(s). Moderate mitral annular calcification. Mild mitral valve regurgitation.  Tricuspid Valve: The tricuspid valve is normal in structure. Tricuspid valve regurgitation is mild.  Aortic Valve: AV is thickened, calcified with restricted motion. Difficult to define the number of leaflets. Peak and mean gradients through the valve arer 22 and 12 mm HG respectively. AVA (VTI) is 1.35 cm2 Dimensionless index is 0.43 Overall consistent with mild aortic stenosis. COmpared to previous echo, mean gradient is decreased (19 to 12 mm Hg). Aortic valve regurgitation is not visualized. Aortic regurgitation PHT measures 353 msec. Aortic valve mean gradient measures 12.0 mmHg. Aortic valve peak gradient measures 22.3 mmHg. Aortic valve area, by VTI measures 1.35 cm.  Pulmonic Valve: The pulmonic valve was normal in structure. Pulmonic valve regurgitation is mild.  Aorta: The aortic root and ascending aorta are structurally normal, with no evidence of dilitation.  Venous: The inferior vena cava is normal in size with greater than 50% respiratory variability, suggesting right atrial pressure of 3 mmHg.  IAS/Shunts: No atrial level shunt detected by color flow Doppler.   LEFT VENTRICLE PLAX 2D  LVIDd:         4.20 cm   Diastology LVIDs:         2.60 cm   LV e' medial:    4.68 cm/s LV PW:         1.00 cm   LV E/e' medial:  25.0 LV IVS:        1.60 cm   LV e' lateral:   9.68 cm/s LVOT diam:     2.00 cm   LV E/e' lateral: 12.1 LV SV:         62 LV SV Index:   31        2D Longitudinal Strain LVOT Area:     3.14 cm  2D Strain GLS (A2C):   -20.3 % 2D Strain GLS (A3C):   -14.5 % 2D Strain GLS (A4C):   -21.4 % 2D Strain GLS Avg:     -18.8 %  3D Volume EF: 3D EF:        55 % LV EDV:       112 ml LV ESV:       50 ml LV SV:        62 ml  RIGHT VENTRICLE RV Basal  diam:  3.30 cm TAPSE (M-mode): 1.7 cm RVSP:           21.8 mmHg  LEFT ATRIUM             Index        RIGHT ATRIUM           Index LA diam:        4.20 cm 2.10 cm/m   RA Pressure: 3.00 mmHg LA Vol (A2C):   48.9 ml 24.45 ml/m  RA Area:     13.60 cm LA Vol (A4C):   46.7 ml 23.35 ml/m  RA Volume:   29.70 ml  14.85 ml/m LA Biplane Vol: 49.5 ml 24.75 ml/m AORTIC VALVE AV Area (Vmax):    1.29 cm AV Area (Vmean):   1.26 cm AV Area (VTI):     1.35 cm AV Vmax:           236.00 cm/s AV Vmean:          164.000 cm/s AV VTI:            0.458 m AV Peak Grad:      22.3 mmHg AV Mean Grad:      12.0 mmHg LVOT Vmax:         96.90 cm/s LVOT Vmean:        66.000 cm/s LVOT VTI:          0.197 m LVOT/AV VTI ratio: 0.43 AI PHT:            353 msec  AORTA Ao Root diam: 3.10 cm Ao Asc diam:  3.50 cm  MITRAL VALVE                TRICUSPID VALVE MV Area (PHT):              TR Peak grad:   18.8 mmHg MV Decel Time:              TR Vmax:        217.00 cm/s MV E velocity: 117.00 cm/s  Estimated RAP:  3.00 mmHg MV A velocity: 132.00 cm/s  RVSP:           21.8 mmHg MV E/A ratio:  0.89 SHUNTS Systemic VTI:  0.20 m Systemic Diam: 2.00  cm  Dietrich Pates MD Electronically signed by Dietrich Pates MD Signature Date/Time: 10/12/2023/4:03:33 PM    Final   MONITORS  LONG TERM MONITOR (3-14 DAYS) 11/08/2022  Narrative   Normal sinus rhythm with average heart rate 82 bpm.   9 episodes of supraventricular tachycardia with the longest lasting 8 beats at a relatively slow rate and 1 episode lasted 13 beats at 187 bpm.   PVC burden less than 1% with 1 instance of trigeminy   No atrial fibrillation   Patch Wear Time:  10 days and 22 hours (2023-11-10T10:03:17-0500 to 2023-11-21T08:51:52-0500)  Patient had a min HR of 47 bpm, max HR of 187 bpm, and avg HR of 82 bpm. Predominant underlying rhythm was Sinus Rhythm. 1 run of Ventricular Tachycardia occurred lasting 6 beats with a max rate of 174 bpm (avg 164  bpm). 9 Supraventricular Tachycardia runs occurred, the run with the fastest interval lasting 13 beats with a max rate of 187 bpm, the longest lasting 8 beats with an avg rate of 94 bpm. Isolated SVEs were rare (<1.0%), SVE Couplets were rare (<1.0%), and SVE Triplets were rare (<1.0%). Isolated VEs were rare (<1.0%, 825), VE Triplets were rare (<1.0%, 1), and no VE Couplets were present. Ventricular Trigeminy was present.    PYP SCAN  MYOCARDIAL AMYLOID PLANAR AND SPECT 11/03/2023  Narrative   Myocardial uptake was positive for radiotracer uptake. The visual grade of myocardial uptake relative to the ribs was Grade 2 (Myocardial uptake equal to rib uptake).   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   Prior study not available for comparison.         Physical Exam:    VS:  There were no vitals taken for this visit.   Wt Readings from Last 3 Encounters:  12/19/23 192 lb (87.1 kg)  11/13/23 191 lb (86.6 kg)  10/27/23 192 lb 1.6 oz (87.1 kg)    Gen: no distress,   Neck: No JVD Cardiac: No Rubs or Gallops, Soft systolic murmur, RRR +2 radial pulses Respiratory: Clear to auscultation bilaterally, normal effort, normal  respiratory rate GI: Soft, nontender, non-distended  MS: +1 bilatearl leg edema;  moves all extremities Integument: Skin feels warm Neuro:  At time of evaluation, alert and oriented to person/place/time/situation  Psych: Normal affect, patient feels well   ASSESSMENT AND PLAN: .    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 Currently on tafamidis, which stabilizes TTR protein but does not remove existing deposits. Symptoms include fatigue, dyspnea, and edema, likely related to cardiac amyloidosis. Discussed potential benefit of Amvuttra once FDA approved for ATTRCA. Neurologist does not suspect peripheral neuropathy. Discussed potential for future trials and role of vitamin A with new  medications. Emphasized importance of monitoring for dyspnea, edema, and arrhythmias. - Order BNP test - Consider cardiac MRI if BNP is elevated - Discuss potential inclusion in amyloid trial if BNP is high ([facility]) - Follow up in 4 months   Aortic Stenosis Mild to moderate aortic stenosis. Discussed potential progression to severe stenosis and need for valve replacement if symptoms worsen. Emphasized monitoring for dyspnea and angina. - Monitor for symptoms of aortic stenosis such as dyspnea and angina Echo in 11/25  Coronary Artery Disease Coronary artery disease, well-managed but requires monitoring for angina. Discussed contribution to symptoms and consideration in differential diagnosis.  General Health Maintenance Discussed importance of monitoring cholesterol levels and aggressive management post musculoskeletal issue resolution.  Follow-up - Follow up in 4 months.  Time Spent Directly with Patient:   I have spent a total of 43 minutes with the patient reviewing notes, imaging, EKGs, labs, and examining the patient as well as establishing an assessment and plan that was discussed personally with the patient. Discussed disease state education.  He had two pages of questions that we went through that focused on clinical trials, COE's and disease modifying agents.    Riley Lam, MD FASE [facility] Cardiologist [facility]  [address] [place], [address] [place], [place] [address] [phone]  9:01 AM

## 2024-01-01 DIAGNOSIS — E859 Amyloidosis, unspecified: Secondary | ICD-10-CM | POA: Diagnosis not present

## 2024-01-01 DIAGNOSIS — M13831 Other specified arthritis, right wrist: Secondary | ICD-10-CM | POA: Diagnosis not present

## 2024-01-01 DIAGNOSIS — M79642 Pain in left hand: Secondary | ICD-10-CM | POA: Diagnosis not present

## 2024-01-01 DIAGNOSIS — G5601 Carpal tunnel syndrome, right upper limb: Secondary | ICD-10-CM | POA: Diagnosis not present

## 2024-01-01 DIAGNOSIS — M66829 Spontaneous rupture of other tendons, unspecified upper arm: Secondary | ICD-10-CM | POA: Diagnosis not present

## 2024-01-02 ENCOUNTER — Ambulatory Visit: Payer: Medicare Other | Admitting: Neurology

## 2024-01-02 DIAGNOSIS — I43 Cardiomyopathy in diseases classified elsewhere: Secondary | ICD-10-CM | POA: Diagnosis not present

## 2024-01-02 DIAGNOSIS — E854 Organ-limited amyloidosis: Secondary | ICD-10-CM | POA: Diagnosis not present

## 2024-01-03 ENCOUNTER — Encounter: Payer: Self-pay | Admitting: Internal Medicine

## 2024-01-03 LAB — BASIC METABOLIC PANEL
BUN/Creatinine Ratio: 22 (ref 10–24)
BUN: 19 mg/dL (ref 8–27)
CO2: 25 mmol/L (ref 20–29)
Calcium: 9.5 mg/dL (ref 8.6–10.2)
Chloride: 103 mmol/L (ref 96–106)
Creatinine, Ser: 0.87 mg/dL (ref 0.76–1.27)
Glucose: 137 mg/dL — ABNORMAL HIGH (ref 70–99)
Potassium: 4.4 mmol/L (ref 3.5–5.2)
Sodium: 142 mmol/L (ref 134–144)
eGFR: 88 mL/min/{1.73_m2} (ref >59)

## 2024-01-03 LAB — PRO B NATRIURETIC PEPTIDE: NT-Pro BNP: 277 pg/mL (ref 0–486)

## 2024-01-05 ENCOUNTER — Ambulatory Visit (HOSPITAL_COMMUNITY): Admit: 2024-01-05 | Payer: Medicare Other | Admitting: Orthopedic Surgery

## 2024-01-05 ENCOUNTER — Other Ambulatory Visit: Payer: Self-pay

## 2024-01-05 ENCOUNTER — Other Ambulatory Visit (HOSPITAL_BASED_OUTPATIENT_CLINIC_OR_DEPARTMENT_OTHER): Payer: Self-pay

## 2024-01-05 SURGERY — ARTHROPLASTY, SHOULDER, TOTAL, REVERSE
Anesthesia: Choice | Site: Shoulder | Laterality: Right

## 2024-01-05 MED ORDER — AREXVY 120 MCG/0.5ML IM SUSR
0.5000 mL | Freq: Once | INTRAMUSCULAR | 0 refills | Status: AC
  Filled 2024-01-05: qty 0.5, 1d supply, fill #0

## 2024-01-08 ENCOUNTER — Other Ambulatory Visit: Payer: Self-pay

## 2024-01-11 DIAGNOSIS — M25511 Pain in right shoulder: Secondary | ICD-10-CM | POA: Diagnosis not present

## 2024-01-15 DIAGNOSIS — M25511 Pain in right shoulder: Secondary | ICD-10-CM | POA: Diagnosis not present

## 2024-01-18 DIAGNOSIS — M25511 Pain in right shoulder: Secondary | ICD-10-CM | POA: Diagnosis not present

## 2024-01-19 ENCOUNTER — Ambulatory Visit: Payer: Medicare Other | Admitting: Internal Medicine

## 2024-01-22 DIAGNOSIS — M25511 Pain in right shoulder: Secondary | ICD-10-CM | POA: Diagnosis not present

## 2024-01-25 DIAGNOSIS — M25511 Pain in right shoulder: Secondary | ICD-10-CM | POA: Diagnosis not present

## 2024-01-30 DIAGNOSIS — M25511 Pain in right shoulder: Secondary | ICD-10-CM | POA: Diagnosis not present

## 2024-01-30 DIAGNOSIS — M25519 Pain in unspecified shoulder: Secondary | ICD-10-CM | POA: Diagnosis not present

## 2024-02-06 DIAGNOSIS — M25511 Pain in right shoulder: Secondary | ICD-10-CM | POA: Diagnosis not present

## 2024-02-08 DIAGNOSIS — M25511 Pain in right shoulder: Secondary | ICD-10-CM | POA: Diagnosis not present

## 2024-02-13 DIAGNOSIS — M25511 Pain in right shoulder: Secondary | ICD-10-CM | POA: Diagnosis not present

## 2024-02-13 DIAGNOSIS — M25519 Pain in unspecified shoulder: Secondary | ICD-10-CM | POA: Diagnosis not present

## 2024-02-16 ENCOUNTER — Other Ambulatory Visit (HOSPITAL_BASED_OUTPATIENT_CLINIC_OR_DEPARTMENT_OTHER): Payer: Self-pay

## 2024-02-16 ENCOUNTER — Other Ambulatory Visit (HOSPITAL_COMMUNITY): Payer: Self-pay

## 2024-02-16 DIAGNOSIS — M25511 Pain in right shoulder: Secondary | ICD-10-CM | POA: Diagnosis not present

## 2024-02-16 MED ORDER — FLURAZEPAM HCL 30 MG PO CAPS
30.0000 mg | ORAL_CAPSULE | Freq: Every day | ORAL | 0 refills | Status: DC
  Filled 2024-02-16: qty 30, 30d supply, fill #0
  Filled 2024-03-10 – 2024-03-19 (×2): qty 30, 30d supply, fill #1
  Filled 2024-04-18: qty 30, 30d supply, fill #2

## 2024-02-17 ENCOUNTER — Other Ambulatory Visit (HOSPITAL_BASED_OUTPATIENT_CLINIC_OR_DEPARTMENT_OTHER): Payer: Self-pay

## 2024-02-19 ENCOUNTER — Other Ambulatory Visit: Payer: Self-pay

## 2024-02-19 ENCOUNTER — Other Ambulatory Visit (HOSPITAL_BASED_OUTPATIENT_CLINIC_OR_DEPARTMENT_OTHER): Payer: Self-pay

## 2024-02-19 DIAGNOSIS — M25561 Pain in right knee: Secondary | ICD-10-CM | POA: Diagnosis not present

## 2024-02-19 DIAGNOSIS — M1712 Unilateral primary osteoarthritis, left knee: Secondary | ICD-10-CM | POA: Diagnosis not present

## 2024-02-20 ENCOUNTER — Other Ambulatory Visit: Payer: Self-pay

## 2024-02-20 DIAGNOSIS — M25511 Pain in right shoulder: Secondary | ICD-10-CM | POA: Diagnosis not present

## 2024-02-20 DIAGNOSIS — R1319 Other dysphagia: Secondary | ICD-10-CM | POA: Diagnosis not present

## 2024-02-20 DIAGNOSIS — R5382 Chronic fatigue, unspecified: Secondary | ICD-10-CM | POA: Diagnosis not present

## 2024-02-20 DIAGNOSIS — I872 Venous insufficiency (chronic) (peripheral): Secondary | ICD-10-CM | POA: Diagnosis not present

## 2024-02-20 DIAGNOSIS — M25562 Pain in left knee: Secondary | ICD-10-CM | POA: Diagnosis not present

## 2024-02-20 DIAGNOSIS — D6869 Other thrombophilia: Secondary | ICD-10-CM | POA: Diagnosis not present

## 2024-02-20 DIAGNOSIS — I82452 Acute embolism and thrombosis of left peroneal vein: Secondary | ICD-10-CM | POA: Diagnosis not present

## 2024-02-20 DIAGNOSIS — E8582 Wild-type transthyretin-related (ATTR) amyloidosis: Secondary | ICD-10-CM | POA: Diagnosis not present

## 2024-02-20 DIAGNOSIS — G629 Polyneuropathy, unspecified: Secondary | ICD-10-CM | POA: Diagnosis not present

## 2024-02-23 DIAGNOSIS — M25511 Pain in right shoulder: Secondary | ICD-10-CM | POA: Diagnosis not present

## 2024-02-27 DIAGNOSIS — M25519 Pain in unspecified shoulder: Secondary | ICD-10-CM | POA: Diagnosis not present

## 2024-02-27 DIAGNOSIS — M25511 Pain in right shoulder: Secondary | ICD-10-CM | POA: Diagnosis not present

## 2024-03-01 DIAGNOSIS — M25511 Pain in right shoulder: Secondary | ICD-10-CM | POA: Diagnosis not present

## 2024-03-06 DIAGNOSIS — E859 Amyloidosis, unspecified: Secondary | ICD-10-CM | POA: Diagnosis not present

## 2024-03-06 DIAGNOSIS — M79642 Pain in left hand: Secondary | ICD-10-CM | POA: Diagnosis not present

## 2024-03-06 DIAGNOSIS — M66821 Spontaneous rupture of other tendons, right upper arm: Secondary | ICD-10-CM | POA: Diagnosis not present

## 2024-03-06 DIAGNOSIS — M13831 Other specified arthritis, right wrist: Secondary | ICD-10-CM | POA: Diagnosis not present

## 2024-03-06 DIAGNOSIS — G5601 Carpal tunnel syndrome, right upper limb: Secondary | ICD-10-CM | POA: Diagnosis not present

## 2024-03-07 ENCOUNTER — Telehealth: Payer: Self-pay | Admitting: Internal Medicine

## 2024-03-07 NOTE — Telephone Encounter (Signed)
 Patient notes that he is doing fair.    Since last visit notes shoulder pain has improved.  Arm neuropathic-like pain.  Still have some shooting pins and needles pain. There are no interval hospital/ED visit.    No chest pain or pressure .  No SOB/DOE and no PND/Orthopnea.  No weight gain or leg swelling.  No palpitations or syncope.  We have discussed the risks and benefits of vutrisitran for wt ATTR-CA and PN.  Discussed the approval of this.  Discussed risk of DOE, arthralgia, pain in extremity and vitamin A decrease.  When he is approved we will concurrently prescribed vitamin A.   We will start the process of starting vutrisiran.  Riley Lam, MD FASE [facility] Cardiologist [facility]  [address] [place], [address] [place], [place] [address] [phone]  4:28 PM

## 2024-03-07 NOTE — Telephone Encounter (Signed)
-----   Message from Olene Floss sent at 03/06/2024  9:48 AM EDT ----- No concerns, By "doing it" do you mean submitting start form? ----- Message ----- From: Christell Constant, MD Sent: 03/04/2024   2:13 PM EDT To: Olene Floss, RPH-CPP; #  I know everyone is exicited about starting him  Was planning on doing it this Tuesday if he still have neuropathic symptoms.  Susa Day, I still have the paperwork.  Any concerns? ----- Message ----- From: Glendale Chard, DO Sent: 12/14/2023  11:22 AM EDT To: Christell Constant, MD  This is good to know, thanks for your quick reply.  Unfortunately, at this time, both vutrisiran and eplontersen are also only approved for hereditary ATTR.  Nothing available for wild type yet.  I'll inform him of this at his visit with me. ----- Message ----- From: Christell Constant, MD Sent: 12/14/2023  10:06 AM EST To: Glendale Chard, DO  Hey Dr. Allena Katz, - we use prevention genetics; he tested negative, but it takes a significant amount of time for it to get scanned into his EPIC chart because it doesn't have direct connection. - I would have no concerns about him being on eplontersen  if you thought it was best for his care.  On the back of Helios B, Vutrisiran is presently being evaluated for ATTR-CA (wt and h).  I suspect it will be approved this spring.  Thanks, Shanette Tamargo ----- Message ----- From: Glendale Chard, DO Sent: 12/14/2023   8:30 AM EST To: Christell Constant, MD  Dr. Izora Ribas, Thank you for keeping me updated in this patient's care.  I see him as NP on 1/21.  As I was reviewing his chart, I could not find his genetic testing results, unless I overlooked it under media.  Do you have these results?  Vutrisiran is only approved for hereditary ATTR so wanted to confirm. Thanks, Donika ----- Message ----- From: Christell Constant, MD Sent: 12/03/2023   3:50 PM EST To: Beverely Low, MD; Cleatis Polka., MD;  #  Results: Bone Scan is most suggestive of bone healing after a fall  PSA 0.997 (Ref Range: 0.000 - 4.000 ng/ml) ATTR- negative - we will look to see if we can get Vutrisiran and Vitamin started for ATTR-PN in 2025 - recommend that tissue from his 01/05/24 shoulder surgery be stained [place] Red for Amyloidosis  Christell Constant, MD

## 2024-03-08 ENCOUNTER — Encounter: Payer: Self-pay | Admitting: Internal Medicine

## 2024-03-08 NOTE — Telephone Encounter (Signed)
Start form faxed.

## 2024-03-11 ENCOUNTER — Other Ambulatory Visit (HOSPITAL_BASED_OUTPATIENT_CLINIC_OR_DEPARTMENT_OTHER): Payer: Self-pay

## 2024-03-11 ENCOUNTER — Telehealth: Payer: Self-pay | Admitting: Pharmacist

## 2024-03-11 DIAGNOSIS — M6281 Muscle weakness (generalized): Secondary | ICD-10-CM | POA: Diagnosis not present

## 2024-03-11 DIAGNOSIS — M1711 Unilateral primary osteoarthritis, right knee: Secondary | ICD-10-CM | POA: Diagnosis not present

## 2024-03-11 DIAGNOSIS — M1712 Unilateral primary osteoarthritis, left knee: Secondary | ICD-10-CM | POA: Diagnosis not present

## 2024-03-11 NOTE — Telephone Encounter (Signed)
 Per Company outdated form was sent in

## 2024-03-11 NOTE — Telephone Encounter (Signed)
 Patient made aware that new start from was sent. I will let him know if we need anything else and will keep him updated.

## 2024-03-11 NOTE — Telephone Encounter (Signed)
 I called pt and LVM for him to call back, although I think I resolved the issue and was able to re-submit the newer form online.

## 2024-03-12 DIAGNOSIS — M6281 Muscle weakness (generalized): Secondary | ICD-10-CM | POA: Diagnosis not present

## 2024-03-14 DIAGNOSIS — M25562 Pain in left knee: Secondary | ICD-10-CM | POA: Diagnosis not present

## 2024-03-15 NOTE — Telephone Encounter (Signed)
 I called [employer] to confirm they received the new start form since I haven't heard anything. Confirmed that they have it and that a benefits investigation is in process.

## 2024-03-16 ENCOUNTER — Other Ambulatory Visit (HOSPITAL_BASED_OUTPATIENT_CLINIC_OR_DEPARTMENT_OTHER): Payer: Self-pay

## 2024-03-19 ENCOUNTER — Other Ambulatory Visit: Payer: Self-pay

## 2024-03-19 ENCOUNTER — Other Ambulatory Visit (HOSPITAL_BASED_OUTPATIENT_CLINIC_OR_DEPARTMENT_OTHER): Payer: Self-pay

## 2024-03-19 NOTE — Telephone Encounter (Signed)
 Dorene Sorrow form [employer] called to report Mr. Eskew cost assessment is faxed over,which I have added to media. Their preferred infusion center for Amvuttra - [facility] in [place]. Jerry's contact info [phone] ext [phone] if we have further question on cost assessment.

## 2024-03-20 ENCOUNTER — Other Ambulatory Visit (HOSPITAL_BASED_OUTPATIENT_CLINIC_OR_DEPARTMENT_OTHER): Payer: Self-pay

## 2024-03-20 DIAGNOSIS — M25562 Pain in left knee: Secondary | ICD-10-CM | POA: Diagnosis not present

## 2024-03-20 NOTE — Telephone Encounter (Signed)
 Medicare will cover 80%. FEP is secondary. I called and no PA is needed with FEP but they couldn't confirm what coverage they would provide as secondary. Infusion center team will attempt to call FEP to clarify what part of the 20% they may cover. Once we sort this out, I will call patient to discuss what I think his price will be. He will need to get at [facility] infusion center. The hope is to have this all coordinated by 5/1

## 2024-03-22 DIAGNOSIS — M6281 Muscle weakness (generalized): Secondary | ICD-10-CM | POA: Diagnosis not present

## 2024-03-25 ENCOUNTER — Telehealth: Payer: Self-pay | Admitting: Pharmacy Technician

## 2024-03-25 ENCOUNTER — Other Ambulatory Visit: Payer: Self-pay | Admitting: Pharmacist

## 2024-03-25 DIAGNOSIS — E854 Organ-limited amyloidosis: Secondary | ICD-10-CM

## 2024-03-25 MED ORDER — VUTRISIRAN SODIUM 25 MG/0.5ML ~~LOC~~ SOSY
25.0000 mg | PREFILLED_SYRINGE | SUBCUTANEOUS | Status: AC
Start: 2024-04-12 — End: ?

## 2024-03-25 NOTE — Telephone Encounter (Signed)
 Auth Submission: NO AUTH NEEDED Site of care:  Payer: Medicare A/B & Federal BCBS Medication & CPT/J Code(s) submitted:  Amvuttra - Jcode: K2706 Route of submission (phone, fax, portal):  Phone #[phone] Fax #[phone] Auth type: Buy/Bill PB Units/visits requested:  Reference number:  1st Verify: [place]-W 9:08a 2nd Verify: [place] 9:23a Approval from:  to    Medicare will cover 80% and Federal BCBS will cover the remaining 20%. No pre-determination is required.  Was informed by [place] (Rep) that it would be withdrawn if submitted due to patient has Medicare A/B.

## 2024-03-25 NOTE — Telephone Encounter (Signed)
 I spoke with patient. Amvuttra will be covered 80% by medicare and 20% by FEP. Will be able to get at [facility] infusion center after 5/2. Orders have been placed. Someone will call him to schedule. We spoke about the importance of vit A supplementation while on Amvutra.  The rdu is 900 mcg (3000 international units)  Patient also states that he has been having a dry cough. Read that ATTR can affect the throat. Wants to know if its from that. He is also on ARB (rarely, but can cause dry cough) Advised I would let Dr.C know and see if he had any thoughts

## 2024-03-26 DIAGNOSIS — M6281 Muscle weakness (generalized): Secondary | ICD-10-CM | POA: Diagnosis not present

## 2024-03-27 ENCOUNTER — Encounter: Payer: Self-pay | Admitting: Pharmacist

## 2024-03-29 DIAGNOSIS — M6281 Muscle weakness (generalized): Secondary | ICD-10-CM | POA: Diagnosis not present

## 2024-04-02 DIAGNOSIS — M6281 Muscle weakness (generalized): Secondary | ICD-10-CM | POA: Diagnosis not present

## 2024-04-04 DIAGNOSIS — M1712 Unilateral primary osteoarthritis, left knee: Secondary | ICD-10-CM | POA: Diagnosis not present

## 2024-04-04 DIAGNOSIS — M6281 Muscle weakness (generalized): Secondary | ICD-10-CM | POA: Diagnosis not present

## 2024-04-04 DIAGNOSIS — M1711 Unilateral primary osteoarthritis, right knee: Secondary | ICD-10-CM | POA: Diagnosis not present

## 2024-04-09 ENCOUNTER — Telehealth: Payer: Self-pay | Admitting: Pharmacist

## 2024-04-09 NOTE — Telephone Encounter (Signed)
 Follow Up:     Patient says he is returning Melissa's call

## 2024-04-10 NOTE — Telephone Encounter (Signed)
 I spoke with pt. No apt has been scheduled. Still working out details and training to give at [facility]. Drug rep was misinformed. Pt ok with plan to follow up next week if he doesn't hear anything. He has started the vit A

## 2024-04-12 NOTE — Telephone Encounter (Signed)
 See mychat message from 4/16

## 2024-04-16 DIAGNOSIS — M6281 Muscle weakness (generalized): Secondary | ICD-10-CM | POA: Diagnosis not present

## 2024-04-18 ENCOUNTER — Other Ambulatory Visit (HOSPITAL_COMMUNITY): Payer: Self-pay | Admitting: Internal Medicine

## 2024-04-18 ENCOUNTER — Other Ambulatory Visit (HOSPITAL_BASED_OUTPATIENT_CLINIC_OR_DEPARTMENT_OTHER): Payer: Self-pay

## 2024-04-18 DIAGNOSIS — M6281 Muscle weakness (generalized): Secondary | ICD-10-CM | POA: Diagnosis not present

## 2024-04-18 DIAGNOSIS — E8582 Wild-type transthyretin-related (ATTR) amyloidosis: Secondary | ICD-10-CM

## 2024-04-19 ENCOUNTER — Other Ambulatory Visit (HOSPITAL_BASED_OUTPATIENT_CLINIC_OR_DEPARTMENT_OTHER): Payer: Self-pay

## 2024-04-23 DIAGNOSIS — R29898 Other symptoms and signs involving the musculoskeletal system: Secondary | ICD-10-CM | POA: Diagnosis not present

## 2024-04-25 ENCOUNTER — Other Ambulatory Visit: Payer: Self-pay | Admitting: Physician Assistant

## 2024-04-25 DIAGNOSIS — I82462 Acute embolism and thrombosis of left calf muscular vein: Secondary | ICD-10-CM

## 2024-04-25 DIAGNOSIS — H35373 Puckering of macula, bilateral: Secondary | ICD-10-CM | POA: Diagnosis not present

## 2024-04-25 DIAGNOSIS — H33312 Horseshoe tear of retina without detachment, left eye: Secondary | ICD-10-CM | POA: Diagnosis not present

## 2024-04-25 DIAGNOSIS — Z961 Presence of intraocular lens: Secondary | ICD-10-CM | POA: Diagnosis not present

## 2024-04-25 DIAGNOSIS — R29898 Other symptoms and signs involving the musculoskeletal system: Secondary | ICD-10-CM | POA: Diagnosis not present

## 2024-04-25 DIAGNOSIS — D3131 Benign neoplasm of right choroid: Secondary | ICD-10-CM | POA: Diagnosis not present

## 2024-04-25 DIAGNOSIS — H43813 Vitreous degeneration, bilateral: Secondary | ICD-10-CM | POA: Diagnosis not present

## 2024-04-26 ENCOUNTER — Inpatient Hospital Stay: Payer: Medicare Other | Admitting: Physician Assistant

## 2024-04-26 ENCOUNTER — Inpatient Hospital Stay: Payer: Medicare Other | Attending: Hematology and Oncology

## 2024-04-26 VITALS — BP 123/65 | HR 91 | Temp 97.5°F | Resp 17 | Ht 68.0 in | Wt 193.1 lb

## 2024-04-26 DIAGNOSIS — Z79899 Other long term (current) drug therapy: Secondary | ICD-10-CM | POA: Insufficient documentation

## 2024-04-26 DIAGNOSIS — Z7901 Long term (current) use of anticoagulants: Secondary | ICD-10-CM | POA: Diagnosis not present

## 2024-04-26 DIAGNOSIS — Z86718 Personal history of other venous thrombosis and embolism: Secondary | ICD-10-CM

## 2024-04-26 DIAGNOSIS — I82462 Acute embolism and thrombosis of left calf muscular vein: Secondary | ICD-10-CM

## 2024-04-26 DIAGNOSIS — Z87891 Personal history of nicotine dependence: Secondary | ICD-10-CM | POA: Diagnosis not present

## 2024-04-26 LAB — CBC WITH DIFFERENTIAL (CANCER CENTER ONLY)
Abs Immature Granulocytes: 0.03 10*3/uL (ref 0.00–0.07)
Basophils Absolute: 0 10*3/uL (ref 0.0–0.1)
Basophils Relative: 1 %
Eosinophils Absolute: 0.1 10*3/uL (ref 0.0–0.5)
Eosinophils Relative: 1 %
HCT: 38.6 % — ABNORMAL LOW (ref 39.0–52.0)
Hemoglobin: 13.2 g/dL (ref 13.0–17.0)
Immature Granulocytes: 1 %
Lymphocytes Relative: 24 %
Lymphs Abs: 1.3 10*3/uL (ref 0.7–4.0)
MCH: 31.3 pg (ref 26.0–34.0)
MCHC: 34.2 g/dL (ref 30.0–36.0)
MCV: 91.5 fL (ref 80.0–100.0)
Monocytes Absolute: 0.6 10*3/uL (ref 0.1–1.0)
Monocytes Relative: 11 %
Neutro Abs: 3.4 10*3/uL (ref 1.7–7.7)
Neutrophils Relative %: 62 %
Platelet Count: 221 10*3/uL (ref 150–400)
RBC: 4.22 MIL/uL (ref 4.22–5.81)
RDW: 12.2 % (ref 11.5–15.5)
WBC Count: 5.4 10*3/uL (ref 4.0–10.5)
nRBC: 0 % (ref 0.0–0.2)

## 2024-04-26 LAB — CMP (CANCER CENTER ONLY)
ALT: 28 U/L (ref 0–44)
AST: 22 U/L (ref 15–41)
Albumin: 4 g/dL (ref 3.5–5.0)
Alkaline Phosphatase: 43 U/L (ref 38–126)
Anion gap: 5 (ref 5–15)
BUN: 16 mg/dL (ref 8–23)
CO2: 29 mmol/L (ref 22–32)
Calcium: 9 mg/dL (ref 8.9–10.3)
Chloride: 105 mmol/L (ref 98–111)
Creatinine: 1.06 mg/dL (ref 0.61–1.24)
GFR, Estimated: >60 mL/min (ref >60)
Glucose, Bld: 110 mg/dL — ABNORMAL HIGH (ref 70–99)
Potassium: 4.3 mmol/L (ref 3.5–5.1)
Sodium: 139 mmol/L (ref 135–145)
Total Bilirubin: 0.3 mg/dL (ref 0.0–1.2)
Total Protein: 6.6 g/dL (ref 6.5–8.1)

## 2024-04-26 MED ORDER — RIVAROXABAN 10 MG PO TABS: 10.0000 mg | ORAL_TABLET | Freq: Every day | ORAL | 3 refills | Status: DC

## 2024-04-26 NOTE — Progress Notes (Signed)
 [facility] [phone]   [phone]) [phone]  PROGRESS NOTE  Patient Care Team: Jeannine Milroy., MD as PCP - General (Internal Medicine) Arty Binning, MD (Inactive) as PCP - Cardiology (Cardiology) Juanita Norlander, MD as Consulting Physician (General Surgery) Daryel Ensign, DO as Consulting Physician (Neurology)  Hematological/Oncological History # Left Lower Extremity DVT  02/28/2022: Lower Extremity US  showed acute deep vein thrombosis involving the left common femoral vein, SF junction, left femoral vein, left proximal profunda vein, left popliteal vein, left posterior tibial veins, and left peroneal veins. Provoked by 14 hour flight.   10/28/2022: Lower Extremity US  showed acute DVT in left peroneal veins.  11/30/2022: establish care with Dr. Rosaline Coma   Interval History:  Nathan [name] 78 y.o. male with medical history significant for unprovoked LLE DVT who presents for a follow up visit. The patient's last visit was on 10/27/2023. In the interim since the last visit he has continued on Xarelto  therapy.   On exam today Nathan [name] reports he is having some ongoing fatigue requiring naps during the day even though he gets a full night sleep. She reports his shoulder pain has improved with physical therapy. 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 He denies shortness of breath or chest pain. He is eating well without any GI symptoms including nausea, vomiting or bowel habit changes. He reports some swelling in the left knee where he had history of Baker's cyst. He denies any pain or edema in his lower legs. He denies any bruising or bleeding episodes. Overall he feels well and is willing and able to continue on Xarelto  therapy at this time.  He has no other complaints.  A full 10 point ROS is otherwise negative.   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    Blood transfusion without reported diagnosis    Complication of anesthesia    jittery after surgery   Coronary artery disease    GERD (gastroesophageal reflux disease)    History of DVT (deep vein thrombosis)    Hyperlipemia    Hypertension    IFG (impaired fasting glucose)    Right carpal tunnel syndrome 02/08/2017   Seasonal allergies    Wears glasses     SURGICAL HISTORY: Past Surgical History:  Procedure Laterality Date   APPENDECTOMY     age 66   COLONOSCOPY     COLONOSCOPY WITH PROPOFOL  N/A 02/15/2016   Procedure: COLONOSCOPY WITH PROPOFOL ;  Surgeon: Garrett Kallman, MD;  Location: [facility];  Service: Endoscopy;  Laterality: N/A;   CORONARY ARTERY BYPASS GRAFT     2003   ESOPHAGOGASTRODUODENOSCOPY (EGD) WITH PROPOFOL  N/A 02/15/2016   Procedure: ESOPHAGOGASTRODUODENOSCOPY (EGD) WITH PROPOFOL ;  Surgeon: Garrett Kallman, MD;  Location: [facility];  Service: Endoscopy;  Laterality: N/A;   EXPLORATION POST OPERATIVE OPEN HEART     EYE SURGERY     detached retina-lt   INGUINAL HERNIA REPAIR Left 04/01/2014   Procedure: HERNIA REPAIR INGUINAL ADULT;  Surgeon: Thayne Fine, MD;  Location: [facility];  Service: General;  Laterality: Left;   INSERTION OF MESH Left 04/01/2014   Procedure: INSERTION OF MESH;  Surgeon: Thayne Fine, MD;  Location: [facility];  Service: General;  Laterality: Left;   KNEE ARTHROCENTESIS  1980   right   SHOULDER ACROMIOPLASTY  1985   rt  SINUS ENDO W/FUSION     TONSILLECTOMY AND ADENOIDECTOMY      SOCIAL HISTORY: Social History   Socioeconomic History   Marital status: Widowed    Spouse name: Not on file   Number of children: Not on file   Years of education: post grad   Highest education level: Not on file  Occupational History   Occupation: [employer]  Tobacco Use   Smoking status: Former    Current packs/day: 0.00    Types: Cigarettes    Quit date: 03/26/1969    Years since quitting:  55.1   Smokeless tobacco: Never  Vaping Use   Vaping status: Never Used  Substance and Sexual Activity   Alcohol  use: Yes    Alcohol /week: 0.0 standard drinks of alcohol     Comment: 2 per day   Drug use: No   Sexual activity: Not Currently  Other Topics Concern   Not on file  Social History Narrative   Patient drinks 1-2 cups of caffeine daily.   Patient is left handed.       Are you right handed or left handed? Left Handed   Are you currently employed ? 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 Yes   Who lives with you?    What type of home do you live in: 1 story or 2 story? Lives in a two story home       Social Drivers of Health   Financial Resource Strain: Not on file  Food Insecurity: Not on file  Transportation Needs: Not on file  Physical Activity: Not on file  Stress: Not on file  Social Connections: Not on file  Intimate Partner Violence: Not on file    FAMILY HISTORY: Family History  Problem Relation Age of Onset   Heart disease Mother    Congestive Heart Failure Mother    Cancer Father    Bladder Cancer Father    Alcohol  abuse Brother    Kidney failure Brother     ALLERGIES:  is allergic to bystolic [nebivolol hcl], lexiscan  [regadenoson ], triamcinolone  acetonide acetate [triamcinolone ], zetia  [ezetimibe ], amlodipine , hydromorphone , eliquis [apixaban], lipitor [atorvastatin ], lisinopril , neosporin [neomycin-bacitracin zn-polymyx], pravastatin, and toprol xl [metoprolol tartrate].  MEDICATIONS:  Current Outpatient Medications  Medication Sig Dispense Refill   acetaminophen  (TYLENOL ) 500 MG tablet Take 1,000 mg by mouth at bedtime.     augmented betamethasone  dipropionate (DIPROLENE -AF) 0.05 % ointment Apply 1 Application topically 2 (two) times daily as needed (skin irritation (hands)).     cetirizine (ZYRTEC) 10 MG tablet Take 10 mg by mouth in the morning.     cycloSPORINE (RESTASIS) 0.05 % ophthalmic  emulsion Place 1 drop into both eyes 2 (two) times daily.      Evolocumab  (REPATHA  SURECLICK) 140 MG/ML SOAJ INJECT 140 MG INTO THE SKIN EVERY 14 (FOURTEEN) DAYS. 6 mL 3   flurazepam (DALMANE ) 30 MG capsule Take 1 capsule (30 mg total) by mouth at bedtime. 90 capsule 0   gabapentin (NEURONTIN) 300 MG capsule Take 300 mg by mouth at bedtime.     GENTEAL 0.25-0.3 % GEL Place 1 drop into both eyes at bedtime.     olmesartan (BENICAR) 20 MG tablet Take 10 mg by mouth daily. Take one half (0.5) tablet by mouth (10 mg) daily.     SYSTANE 0.4-0.3 % SOLN Place 1 drop into both eyes at bedtime.     Tafamidis  61 MG CAPS Take 1  capsule (61 mg total) by mouth daily. 90 capsule 3   triamcinolone  (NASACORT ) 55 MCG/ACT AERO nasal inhaler Place 2 sprays into the nose at bedtime.     rivaroxaban  (XARELTO ) 10 MG TABS tablet Take 1 tablet (10 mg total) by mouth daily. 90 tablet 3   Current Facility-Administered Medications  Medication Dose Route Frequency Provider Last Rate Last Admin   vutrisiran  sodium (AMVUTTRA ) 25 MG/0.5ML syringe 25 mg  25 mg Subcutaneous Q90 days Chandrasekhar, Mahesh A, MD        REVIEW OF SYSTEMS:   Constitutional: ( - ) fevers, ( - )  chills , ( - ) night sweats Eyes: ( - ) blurriness of vision, ( - ) double vision, ( - ) watery eyes Ears, nose, mouth, throat, and face: ( - ) mucositis, ( - ) sore throat Respiratory: ( - ) cough, ( - ) dyspnea, ( - ) wheezes Cardiovascular: ( - ) palpitation, ( - ) chest discomfort, ( - ) lower extremity swelling Gastrointestinal:  ( - ) nausea, ( - ) heartburn, ( - ) change in bowel habits Skin: ( - ) abnormal skin rashes Lymphatics: ( - ) new lymphadenopathy, ( - ) easy bruising Neurological: ( - ) numbness, ( - ) tingling, ( - ) new weaknesses Behavioral/Psych: ( - ) mood change, ( - ) new changes  All other systems were reviewed with the patient and are negative.  PHYSICAL EXAMINATION: Vitals:   04/26/24 0957  BP: 123/65  Pulse: 91  Resp:  17  Temp: (!) 97.5 F (36.4 C)  SpO2: 97%    Filed Weights   04/26/24 0957  Weight: 193 lb 1.6 oz (87.6 kg)     GENERAL: well appearing elderly Caucasian male, alert, no distress and comfortable SKIN: skin color, texture, turgor are normal, no rashes or significant lesions EYES: conjunctiva are pink and non-injected, sclera clear LUNGS: clear to auscultation and percussion with normal breathing effort HEART: regular rate & rhythm and no murmurs and no lower extremity edema Musculoskeletal: no cyanosis of digits and no clubbing  PSYCH: alert & oriented x 3, fluent speech NEURO: no focal motor/sensory deficits  LABORATORY DATA:  I have reviewed the data as listed    Latest Ref Rng & Units 04/26/2024    9:47 AM 10/27/2023   10:09 AM 05/30/2023   10:52 AM  CBC  WBC 4.0 - 10.5 K/uL 5.4  5.1  6.8   Hemoglobin 13.0 - 17.0 g/dL 54.0  98.1  19.1   Hematocrit 39.0 - 52.0 % 38.6  38.8  36.4   Platelets 150 - 400 K/uL 221  177  179        Latest Ref Rng & Units 04/26/2024    9:47 AM 01/02/2024    3:06 PM 10/27/2023   10:09 AM  CMP  Glucose 70 - 99 mg/dL 478  295  99   BUN 8 - 23 mg/dL 16  19  27    Creatinine 0.61 - 1.24 mg/dL 6.21  3.08  6.57   Sodium 135 - 145 mmol/L 139  142  138   Potassium 3.5 - 5.1 mmol/L 4.3  4.4  4.6   Chloride 98 - 111 mmol/L 105  103  105   CO2 22 - 32 mmol/L 29  25  30    Calcium  8.9 - 10.3 mg/dL 9.0  9.5  9.2   Total Protein 6.5 - 8.1 g/dL 6.6   6.5   Total Bilirubin 0.0 - 1.2 mg/dL  0.3   0.4   Alkaline Phos 38 - 126 U/L 43   53   AST 15 - 41 U/L 22   20   ALT 0 - 44 U/L 28   28     RADIOGRAPHIC STUDIES: No results found.  ASSESSMENT & PLAN Nathan [name] is a 78 y.o.  male with medical history significant for unprovoked LLE DVT who presents for a follow up visit.   After review of the labs, review of the records, and discussion with the patient the patients findings are most consistent with recurrent DVTs, 1 provoked and 1 unprovoked.   A  provoked venous thromboembolism (VTE) is one that has a clear inciting factor or event. Provoking factors include prolonged travel/immobility, surgery (particular abdominal or orthropedic), trauma,  and pregnancy/ estrogen containing birth control. After a detailed history and review of the records there is no clear provoking factor for this patient's VTE.  Patients with unprovoked VTEs have up to 25% recurrence after 5 years and 36% at 10 years, with 4% of these clots being fatal (BMJ?2019;366:l4363). Therefore the formal recommendation for unprovoked VTE's is lifelong anticoagulation, as the cause may not be transient or reversible. We recommend 6 months or full strength anticoagulation with a re-evaluation after that time.  The patient's will then have a choice of maintenance dose DOAC (preferred, recommended), 81mg  ASA PO daily (non-preferred), or no further anticoagulation (not recommended).     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  Lupus anticoagulant panel would be altered by presence of blood thinner, will hold on this testing.   --recommend the patient continue xarelto  10 mg PO daily maintenance dose. Refill sent.  --patient denies any bleeding, bruising, or dark stools on this medication. It is well tolerated. No difficulties accessing/affording the medication  --labs today show white blood cell 5.4, hemoglobin 13.2, MCV 91.5, platelets 221. Creatinine and LFTs are normal.  --RTC in 6 months' time with strict return precautions for overt signs of bleeding.    No orders of the defined types were placed in this encounter.  All questions were answered. The patient knows to call the clinic with any problems, questions or concerns.   I have spent a total of 25 minutes minutes of face-to-face and non-face-to-face time, preparing to see the  patient,  performing a medically appropriate examination, counseling and educating the patient,documenting clinical information in the electronic health record, independently interpreting results and communicating results to the patient, and care coordination.   Wyline Hearing PA-C Dept of Hematology and Oncology [facility] Phone: [phone]  04/26/2024 11:21 AM

## 2024-04-29 ENCOUNTER — Ambulatory Visit: Payer: Medicare Other | Attending: Internal Medicine | Admitting: Internal Medicine

## 2024-04-29 VITALS — BP 129/78 | HR 84 | Ht 68.0 in | Wt 192.0 lb

## 2024-04-29 DIAGNOSIS — R059 Cough, unspecified: Secondary | ICD-10-CM | POA: Diagnosis not present

## 2024-04-29 DIAGNOSIS — I350 Nonrheumatic aortic (valve) stenosis: Secondary | ICD-10-CM | POA: Insufficient documentation

## 2024-04-29 DIAGNOSIS — E8589 Other amyloidosis: Secondary | ICD-10-CM | POA: Diagnosis not present

## 2024-04-29 DIAGNOSIS — E854 Organ-limited amyloidosis: Secondary | ICD-10-CM | POA: Insufficient documentation

## 2024-04-29 DIAGNOSIS — I43 Cardiomyopathy in diseases classified elsewhere: Secondary | ICD-10-CM | POA: Insufficient documentation

## 2024-04-29 NOTE — Progress Notes (Signed)
 Cardiology Office Note:  .    Date:  04/29/2024  ID:  Nathan [name], DOB 06-12-1946, MRN [id number] PCP: Jeannine Milroy., MD  [facility] Providers Cardiologist:  Mickiel Albany, MD (Inactive)     CC: Follow up   History of Present Illness: .    Nathan [name] is a 78 y.o. 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  He is scheduled to start Vutrisiran  treatment on May 01, 2024, and has several questions and concerns regarding this new medication. His condition is being managed by hematology due to a history of unprovoked blood clots. He has been on tafamidis  and vitamin A, and is currently taking Zyrtec, Repatha , and Benicar (olmesartan) for blood pressure management.  He has increased fatigue since his last visit, feeling exhausted after morning activities and often falling asleep after lunch. No snoring, sudden waking from sleep, or obstructive sleep apnea, as he has tested negative for it.  He experiences worsening bloating, with a sensation of fullness that sometimes resolves after bowel movements. No dietary triggers have been identified.  He has significant knee pain and instability, limiting his ability to walk more than fifteen minutes. Physical therapy has been attempted, but he experiences severe pain following activity, described as nerve pain radiating down both legs. He has a history of deep vein thrombosis in the leg, raising concerns about potential complications with knee surgery.  He reports new symptoms of hiccups and coughing over the past four months, occurring spontaneously without specific triggers. He also experiences electrical shock-like sensations in his chest, described as brief and unpredictable.  He is concerned about his declining ability to travel and engage in activities he enjoys, such as walking on the beach or traveling to [place], due  to his physical limitations.  Discussed the use of AI scribe software for clinical note transcription with the patient, who gave verbal consent to proceed.  Relevant histories: .  Social  - funded studies in rare disease, goal is to travel more ROS: As per HPI.   Studies Reviewed: .     Cardiac Studies & Procedures   ______________________________________________________________________________________________   STRESS TESTS  MYOCARDIAL PERFUSION IMAGING 07/15/2015  Narrative  Nuclear stress EF: 63%.  There was no ST segment deviation noted during stress.  The study is normal.  This is a low risk study.  The left ventricular ejection fraction is normal (55-65%).  Normal nuclear study with no prior scar or ischemia.   ECHOCARDIOGRAM  ECHOCARDIOGRAM COMPLETE 10/12/2023  Narrative ECHOCARDIOGRAM REPORT    Patient Name:   Nathan [name] Date of Exam: 10/12/2023 Medical Rec #:  [id number]     Height:       68.0 in Accession #:    [id number]    Weight:       190.0 lb Date of Birth:  02-29-1946    BSA:          2.000 m Patient Age:    25 years      BP:           120/71 mmHg Patient Gender: M             HR:           78 bpm. Exam Location:  [address]  Procedure: 2D Echo, 3D Echo, Cardiac Doppler, Color Doppler and Strain Analysis  Indications:    I35.0 Aortic Stenosis  History:  Patient has prior history of Echocardiogram examinations, most recent 10/12/2022. CAD, Prior CABG, DVT; Risk Factors:Hypertension and HLD.  Sonographer:    Juventino Oppenheim RCS Referring Phys: [id number] Leilani Punter SWINYER  IMPRESSIONS   1. Left ventricular ejection fraction, by estimation, is 65 to 70%. The left ventricle has normal function. There is moderate asymmetric left ventricular hypertrophy. Left ventricular diastolic parameters are consistent with Grade I diastolic dysfunction (impaired relaxation). Elevated left atrial pressure. 2. Right ventricular systolic function is  normal. The right ventricular size is normal. There is normal pulmonary artery systolic pressure. 3. Mild mitral valve regurgitation. Moderate mitral annular calcification. 4. AV is thickened, calcified with restricted motion. Difficult to define the number of leaflets. Peak and mean gradients through the valve arer 22 and 12 mm HG respectively. AVA (VTI) is 1.35 cm2 Dimensionless index is 0.43 Overall consistent with mild aortic stenosis. COmpared to previous echo, mean gradient is decreased (19 to 12 mm Hg) . Aortic valve regurgitation is not visualized. 5. The inferior vena cava is normal in size with greater than 50% respiratory variability, suggesting right atrial pressure of 3 mmHg.  Comparison(s): The left ventricular function is unchanged.  FINDINGS Left Ventricle: Left ventricular ejection fraction, by estimation, is 65 to 70%. The left ventricle has normal function. Global longitudinal strain performed but not reported based on interpreter judgement due to suboptimal tracking. The left ventricular internal cavity size was normal in size. There is moderate asymmetric left ventricular hypertrophy. Left ventricular diastolic parameters are consistent with Grade I diastolic dysfunction (impaired relaxation). Elevated left atrial pressure.  Right Ventricle: The right ventricular size is normal. Right vetricular wall thickness was not assessed. Right ventricular systolic function is normal. There is normal pulmonary artery systolic pressure. The tricuspid regurgitant velocity is 2.17 m/s, and with an assumed right atrial pressure of 3 mmHg, the estimated right ventricular systolic pressure is 21.8 mmHg.  Left Atrium: Left atrial size was normal in size.  Right Atrium: Right atrial size was normal in size.  Pericardium: There is no evidence of pericardial effusion.  Mitral Valve: There is moderate thickening of the mitral valve leaflet(s). There is mild calcification of the mitral valve  leaflet(s). Moderate mitral annular calcification. Mild mitral valve regurgitation.  Tricuspid Valve: The tricuspid valve is normal in structure. Tricuspid valve regurgitation is mild.  Aortic Valve: AV is thickened, calcified with restricted motion. Difficult to define the number of leaflets. Peak and mean gradients through the valve arer 22 and 12 mm HG respectively. AVA (VTI) is 1.35 cm2 Dimensionless index is 0.43 Overall consistent with mild aortic stenosis. COmpared to previous echo, mean gradient is decreased (19 to 12 mm Hg). Aortic valve regurgitation is not visualized. Aortic regurgitation PHT measures 353 msec. Aortic valve mean gradient measures 12.0 mmHg. Aortic valve peak gradient measures 22.3 mmHg. Aortic valve area, by VTI measures 1.35 cm.  Pulmonic Valve: The pulmonic valve was normal in structure. Pulmonic valve regurgitation is mild.  Aorta: The aortic root and ascending aorta are structurally normal, with no evidence of dilitation.  Venous: The inferior vena cava is normal in size with greater than 50% respiratory variability, suggesting right atrial pressure of 3 mmHg.  IAS/Shunts: No atrial level shunt detected by color flow Doppler.   LEFT VENTRICLE PLAX 2D LVIDd:         4.20 cm   Diastology LVIDs:         2.60 cm   LV e' medial:    4.68 cm/s LV PW:  1.00 cm   LV E/e' medial:  25.0 LV IVS:        1.60 cm   LV e' lateral:   9.68 cm/s LVOT diam:     2.00 cm   LV E/e' lateral: 12.1 LV SV:         62 LV SV Index:   31        2D Longitudinal Strain LVOT Area:     3.14 cm  2D Strain GLS (A2C):   -20.3 % 2D Strain GLS (A3C):   -14.5 % 2D Strain GLS (A4C):   -21.4 % 2D Strain GLS Avg:     -18.8 %  3D Volume EF: 3D EF:        55 % LV EDV:       112 ml LV ESV:       50 ml LV SV:        62 ml  RIGHT VENTRICLE RV Basal diam:  3.30 cm TAPSE (M-mode): 1.7 cm RVSP:           21.8 mmHg  LEFT ATRIUM             Index        RIGHT ATRIUM           Index LA  diam:        4.20 cm 2.10 cm/m   RA Pressure: 3.00 mmHg LA Vol (A2C):   48.9 ml 24.45 ml/m  RA Area:     13.60 cm LA Vol (A4C):   46.7 ml 23.35 ml/m  RA Volume:   29.70 ml  14.85 ml/m LA Biplane Vol: 49.5 ml 24.75 ml/m AORTIC VALVE AV Area (Vmax):    1.29 cm AV Area (Vmean):   1.26 cm AV Area (VTI):     1.35 cm AV Vmax:           236.00 cm/s AV Vmean:          164.000 cm/s AV VTI:            0.458 m AV Peak Grad:      22.3 mmHg AV Mean Grad:      12.0 mmHg LVOT Vmax:         96.90 cm/s LVOT Vmean:        66.000 cm/s LVOT VTI:          0.197 m LVOT/AV VTI ratio: 0.43 AI PHT:            353 msec  AORTA Ao Root diam: 3.10 cm Ao Asc diam:  3.50 cm  MITRAL VALVE                TRICUSPID VALVE MV Area (PHT):              TR Peak grad:   18.8 mmHg MV Decel Time:              TR Vmax:        217.00 cm/s MV E velocity: 117.00 cm/s  Estimated RAP:  3.00 mmHg MV A velocity: 132.00 cm/s  RVSP:           21.8 mmHg MV E/A ratio:  0.89 SHUNTS Systemic VTI:  0.20 m Systemic Diam: 2.00 cm  Ola Berger MD Electronically signed by Ola Berger MD Signature Date/Time: 10/12/2023/4:03:33 PM    Final    MONITORS  LONG TERM MONITOR (3-14 DAYS) 11/08/2022  Narrative   Normal sinus rhythm with average heart rate 82 bpm.  9 episodes of supraventricular tachycardia with the longest lasting 8 beats at a relatively slow rate and 1 episode lasted 13 beats at 187 bpm.   PVC burden less than 1% with 1 instance of trigeminy   No atrial fibrillation   Patch Wear Time:  10 days and 22 hours (2023-11-10T10:03:17-0500 to 2023-11-21T08:51:52-0500)  Patient had a min HR of 47 bpm, max HR of 187 bpm, and avg HR of 82 bpm. Predominant underlying rhythm was Sinus Rhythm. 1 run of Ventricular Tachycardia occurred lasting 6 beats with a max rate of 174 bpm (avg 164 bpm). 9 Supraventricular Tachycardia runs occurred, the run with the fastest interval lasting 13 beats with a max rate of 187 bpm, the  longest lasting 8 beats with an avg rate of 94 bpm. Isolated SVEs were rare (<1.0%), SVE Couplets were rare (<1.0%), and SVE Triplets were rare (<1.0%). Isolated VEs were rare (<1.0%, 825), VE Triplets were rare (<1.0%, 1), and no VE Couplets were present. Ventricular Trigeminy was present.      PYP SCAN  MYOCARDIAL AMYLOID PLANAR AND SPECT 11/03/2023  Narrative   Myocardial uptake was positive for radiotracer uptake. The visual grade of myocardial uptake relative to the ribs was Grade 2 (Myocardial uptake equal to rib uptake).   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   Prior study not available for comparison.  ______________________________________________________________________________________________       Physical Exam:    VS:  BP 129/78 (BP Location: Right Arm)   Pulse 84   Ht 5\' 8"  (1.727 m)   Wt 192 lb (87.1 kg)   SpO2 94%   BMI 29.19 kg/m    Wt Readings from Last 3 Encounters:  04/29/24 192 lb (87.1 kg)  04/26/24 193 lb 1.6 oz (87.6 kg)  12/29/23 191 lb 3.2 oz (86.7 kg)    Gen: no distress   Neck: No JVD Cardiac: No Rubs or Gallops, systolic Murmur, RRR +2 radial pulses Respiratory: Clear to auscultation bilaterally, normal effort, normal  respiratory rate GI: Soft, nontender, non-distended  MS: No  edema;  moves all extremities Integument: Skin feels warm Neuro:  At time of evaluation, alert and oriented to person/place/time/situation  Psych: Normal affect, patient feels ok   ASSESSMENT AND PLAN: .    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 Initiating Vutrisiran  on 05/01/2024. Previously treated with tafamidis . No heart failure hospitalizations. Echocardiogram scheduled for October 2025. Vutrisiran  expected to alleviate cardiac symptoms and peripheral neuropathy, potentially enhancing quality of life and longevity. If no improvement, consider cardiac MRI to assess deposition  disease. Potential referral to Dr. Jarold Merlin at [facility] for clinical trials if necessary. - Start Vutrisiran  on 05/01/2024 + vitamin A, continue tafamadis - Schedule echocardiogram for October 2025 - Consider cardiac MRI if no improvement with Vutrisiran  - Consider referral to [facility] Agapito Horseman at [facility] for clinical trials if needed  Moderate aortic stenosis Moderate aortic stenosis with progression from mild to moderate. No current symptoms warranting intervention. Potential for TAVR if symptoms worsen. Echocardiogram planned for October 2025 to monitor progression. If symptoms such as worsening cough or dyspnea develop, consider CT scan to evaluate valve status. - Schedule echocardiogram for October 2025 - Consider TAVR CT scan if symptoms such as cough or dyspnea worsen  Peripheral neuropathy Peripheral neuropathy with leg pain. Starting Vutrisiran  with expectation of improvement. Previous specialist opinion suggested non-neuropathic pain, but symptoms suggest otherwise. If no improvement, consider cardiac MRI  to assess for deposition disease. - Start Vutrisiran  on 05/01/2024 - long term may be a candidate for antibody therapy  Fatigue Chronic fatigue with no clear etiology. No evidence of obstructive sleep apnea. Potential impact of cardiac amyloidosis and peripheral neuropathy on fatigue. Monitoring response to Vutrisiran . Fatigue may be exacerbated by decreased activity levels. - Monitor response to Vutrisiran   Bloating Chronic bloating with no clear dietary triggers. Potential impact of decreased activity and cardiac amyloidosis. Monitoring response to Vutrisiran . Bloating may be related to decreased physical activity and overall health status.  Hiccups and cough New onset hiccups and cough. Potential cardiac amyloidosis involvement. Plan to check BNP levels to assess for fluid overload. Monitoring for progression of symptoms. If symptoms worsen, consider further evaluation for cardiac  involvement. 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 Increased risk of DVT with potential knee surgery. Plan to resume anticoagulation post-surgery as soon as safely possible. Acknowledged risk of thromboembolism during surgery and need for careful postoperative management.  Knee pain and instability Chronic knee pain and instability limiting activity. Concerns about surgery due to risk of DVT and instability post-surgery. Potential benefits of surgery to improve activity levels and break cycle of inactivity and fatigue. Plan to discuss surgical options with Dr. Armand Lamy. Surgery could potentially improve quality of life by increasing mobility and reducing pain.  IF he gets surgery, we would ask for a tissue to be stained for amyloid  Depression Concerns about potential depression due to decreased activity and inability to travel. Impact of physical limitations on mental health. Monitoring mood and considering impact of improved physical health on mood. Acknowledged psychological impact of physical limitations and importance of addressing mental health.  Post Echo f/u  Time Spent Directly with Patient:   I have spent a total of 45 minutes with the patient reviewing notes, imaging, EKGs, labs, and examining the patient as well as establishing an assessment and plan that was discussed personally with the patient. Discussed disease state education.  Patient presented with a question sheet of 11 questions in addition to our assessment, we were able to review each question.   Gloriann Larger, MD FASE [facility] Cardiologist [facility]  [address] [place], [address] [place], [place] [address] [phone]  12:48 PM

## 2024-04-29 NOTE — Patient Instructions (Addendum)
 Medication Instructions:  Your physician recommends that you continue on your current medications as directed. Please refer to the Current Medication list given to you today.  *If you need a refill on your cardiac medications before your next appointment, please call your pharmacy*  Lab Work: BNP at [facility]   If you have labs (blood work) drawn today and your tests are completely normal, you will receive your results only by: MyChart Message (if you have MyChart) OR A paper copy in the mail If you have any lab test that is abnormal or we need to change your treatment, we will call you to review the results.  Testing/Procedures: OCT 2025- - - Your physician has requested that you have an echocardiogram. Echocardiography is a painless test that uses sound waves to create images of your heart. It provides your doctor with information about the size and shape of your heart and how well your heart's chambers and valves are working. This procedure takes approximately one hour. There are no restrictions for this procedure. Please do NOT wear cologne, perfume, aftershave, or lotions (deodorant is allowed). Please arrive 15 minutes prior to your appointment time.  Please note: We ask at that you not bring children with you during ultrasound (echo/ vascular) testing. Due to room size and safety concerns, children are not allowed in the ultrasound rooms during exams. Our front office staff cannot provide observation of children in our lobby area while testing is being conducted. An adult accompanying a patient to their appointment will only be allowed in the ultrasound room at the discretion of the ultrasound technician under special circumstances. We apologize for any inconvenience.    Follow-Up: At [facility], you and your health needs are our priority.  As part of our continuing mission to provide you with exceptional heart care, our providers are all part of one team.  This team includes  your primary Cardiologist (physician) and Advanced Practice Providers or APPs (Physician Assistants and Nurse Practitioners) who all work together to provide you with the care you need, when you need it.  Your next appointment:   5-6 month(s)  Provider:   Gloriann Larger, MD

## 2024-04-30 ENCOUNTER — Ambulatory Visit: Payer: Self-pay | Admitting: Internal Medicine

## 2024-04-30 DIAGNOSIS — R29898 Other symptoms and signs involving the musculoskeletal system: Secondary | ICD-10-CM | POA: Diagnosis not present

## 2024-04-30 LAB — PRO B NATRIURETIC PEPTIDE: NT-Pro BNP: 273 pg/mL (ref 0–486)

## 2024-05-01 ENCOUNTER — Ambulatory Visit (HOSPITAL_COMMUNITY)
Admission: RE | Admit: 2024-05-01 | Discharge: 2024-05-01 | Disposition: A | Discharge status: Home / Self Care | Source: Ambulatory Visit | Attending: Internal Medicine | Admitting: Internal Medicine

## 2024-05-01 DIAGNOSIS — E8582 Wild-type transthyretin-related (ATTR) amyloidosis: Secondary | ICD-10-CM | POA: Insufficient documentation

## 2024-05-01 MED ORDER — VUTRISIRAN SODIUM 25 MG/0.5ML ~~LOC~~ SOSY
25.0000 mg | PREFILLED_SYRINGE | SUBCUTANEOUS | Status: DC
  Administered 2024-05-01: 25 mg via SUBCUTANEOUS
  Filled 2024-05-01: qty 0.5

## 2024-05-02 DIAGNOSIS — R29898 Other symptoms and signs involving the musculoskeletal system: Secondary | ICD-10-CM | POA: Diagnosis not present

## 2024-05-08 ENCOUNTER — Telehealth: Payer: Self-pay | Admitting: Pharmacy Technician

## 2024-05-08 DIAGNOSIS — R29898 Other symptoms and signs involving the musculoskeletal system: Secondary | ICD-10-CM | POA: Diagnosis not present

## 2024-05-08 NOTE — Telephone Encounter (Signed)
 Pharmacy Patient Advocate Encounter   Received notification from CoverMyMeds that prior authorization for repatha  is required/requested.   Insurance verification completed.   The patient is insured through [employer] [facility] .   Per test claim: PA required; PA submitted to above mentioned insurance via CoverMyMeds Key/confirmation #/EOC [id number] Status is pending

## 2024-05-08 NOTE — Telephone Encounter (Signed)
 Pharmacy Patient Advocate Encounter  Received notification from [employer] [facility] that Prior Authorization for repatha  has been APPROVED from 04/08/24 to 05/08/25. Spoke to pharmacy to process.[facility]ay is [facility] said just got on the 4th .    PA #/Case ID/Reference #: [id number]   Sent pt message

## 2024-05-10 DIAGNOSIS — R29898 Other symptoms and signs involving the musculoskeletal system: Secondary | ICD-10-CM | POA: Diagnosis not present

## 2024-05-14 DIAGNOSIS — R29898 Other symptoms and signs involving the musculoskeletal system: Secondary | ICD-10-CM | POA: Diagnosis not present

## 2024-05-15 DIAGNOSIS — D225 Melanocytic nevi of trunk: Secondary | ICD-10-CM | POA: Diagnosis not present

## 2024-05-15 DIAGNOSIS — C44612 Basal cell carcinoma of skin of right upper limb, including shoulder: Secondary | ICD-10-CM | POA: Diagnosis not present

## 2024-05-15 DIAGNOSIS — L720 Epidermal cyst: Secondary | ICD-10-CM | POA: Diagnosis not present

## 2024-05-15 DIAGNOSIS — D692 Other nonthrombocytopenic purpura: Secondary | ICD-10-CM | POA: Diagnosis not present

## 2024-05-15 DIAGNOSIS — L821 Other seborrheic keratosis: Secondary | ICD-10-CM | POA: Diagnosis not present

## 2024-05-15 DIAGNOSIS — L820 Inflamed seborrheic keratosis: Secondary | ICD-10-CM | POA: Diagnosis not present

## 2024-05-15 DIAGNOSIS — D1801 Hemangioma of skin and subcutaneous tissue: Secondary | ICD-10-CM | POA: Diagnosis not present

## 2024-05-15 DIAGNOSIS — L570 Actinic keratosis: Secondary | ICD-10-CM | POA: Diagnosis not present

## 2024-05-15 DIAGNOSIS — L814 Other melanin hyperpigmentation: Secondary | ICD-10-CM | POA: Diagnosis not present

## 2024-05-15 DIAGNOSIS — Z85828 Personal history of other malignant neoplasm of skin: Secondary | ICD-10-CM | POA: Diagnosis not present

## 2024-05-15 DIAGNOSIS — D0461 Carcinoma in situ of skin of right upper limb, including shoulder: Secondary | ICD-10-CM | POA: Diagnosis not present

## 2024-05-17 DIAGNOSIS — R29898 Other symptoms and signs involving the musculoskeletal system: Secondary | ICD-10-CM | POA: Diagnosis not present

## 2024-05-20 DIAGNOSIS — R29898 Other symptoms and signs involving the musculoskeletal system: Secondary | ICD-10-CM | POA: Diagnosis not present

## 2024-05-22 DIAGNOSIS — M1712 Unilateral primary osteoarthritis, left knee: Secondary | ICD-10-CM | POA: Diagnosis not present

## 2024-05-22 DIAGNOSIS — M25562 Pain in left knee: Secondary | ICD-10-CM | POA: Diagnosis not present

## 2024-05-22 DIAGNOSIS — M25551 Pain in right hip: Secondary | ICD-10-CM | POA: Diagnosis not present

## 2024-05-22 DIAGNOSIS — M25552 Pain in left hip: Secondary | ICD-10-CM | POA: Diagnosis not present

## 2024-05-22 DIAGNOSIS — M25561 Pain in right knee: Secondary | ICD-10-CM | POA: Diagnosis not present

## 2024-05-23 ENCOUNTER — Other Ambulatory Visit (HOSPITAL_BASED_OUTPATIENT_CLINIC_OR_DEPARTMENT_OTHER): Payer: Self-pay

## 2024-05-23 MED ORDER — FLURAZEPAM HCL 30 MG PO CAPS
30.0000 mg | ORAL_CAPSULE | Freq: Every day | ORAL | 0 refills | Status: DC
  Filled 2024-05-23: qty 30, 30d supply, fill #0

## 2024-05-24 ENCOUNTER — Other Ambulatory Visit (HOSPITAL_BASED_OUTPATIENT_CLINIC_OR_DEPARTMENT_OTHER): Payer: Self-pay

## 2024-05-24 ENCOUNTER — Other Ambulatory Visit: Payer: Self-pay

## 2024-05-24 ENCOUNTER — Telehealth: Payer: Self-pay | Admitting: *Deleted

## 2024-05-24 NOTE — Telephone Encounter (Signed)
 Dr. Paulita Boss , patient's chart was reviewed for preoperative cardiac evaluation.  He/she was seen by you on 04/29/24 and according to protocol, we request that you comment on cardiac risk for upcoming procedure since office visit was less than 2 months ago.    Please route your response to p cv div preop.  Thank you, Gerldine Koch, NP-C 05/24/2024, 4:40 PM

## 2024-05-24 NOTE — Telephone Encounter (Signed)
   Pre-operative Risk Assessment    Patient Name: Nathan [name]  DOB: December 08, 1946 MRN: [id number]   Date of last office visit: 04/29/24 DR. CHANDRASEKHAR Date of next office visit: NONE   Request for Surgical Clearance    Procedure:  LEFT TOTAL KNEE ARTHROPLASTY  Date of Surgery:  Clearance 07/29/24                                Surgeon:  DR. Liliane Rei Surgeon's Group or Practice Name:  [facility] Phone number:  [phone] KERRI MAZE Fax number:  [phone]   Type of Clearance Requested:   - Medical  - Pharmacy:  Hold Rivaroxaban  (Xarelto )     Type of Anesthesia:  CHOICE   Additional requests/questions:    Princeton Broom   05/24/2024, 4:10 PM

## 2024-05-27 ENCOUNTER — Encounter: Payer: Self-pay | Admitting: Internal Medicine

## 2024-05-27 ENCOUNTER — Other Ambulatory Visit (HOSPITAL_BASED_OUTPATIENT_CLINIC_OR_DEPARTMENT_OTHER): Payer: Self-pay

## 2024-05-30 NOTE — Telephone Encounter (Signed)
 His Xarelto  is managed by hematology/oncology. Would defer to them

## 2024-05-31 NOTE — Telephone Encounter (Signed)
   Patient Name: Nathan [name]  DOB: 1946-04-02 MRN: [id number]  Primary Cardiologist: Mickiel Albany, MD (Inactive)  Chart reviewed as part of pre-operative protocol coverage. Given past medical history and time since last visit, based on ACC/AHA guidelines, Nathan [name] is at acceptable risk for the planned procedure without further cardiovascular testing. See prior note - ATTR-CA NYHA I with no symptoms of AS - hx of DVT- management as per primary - Reasonable to proceed with surgery -  If he gets surgery, we would ask for a tissue to be stained for amyloid (congo red birefringence)  His Xarelto  is managed by hematology/oncology. Would defer to them  The patient was advised that if he develops new symptoms prior to surgery to contact our office to arrange for a follow-up visit, and he verbalized understanding.  I will route this recommendation to the requesting party via Epic fax function and remove from pre-op pool.  Please call with questions.  Friddie Jetty, NP 05/31/2024, 7:39 AM

## 2024-06-12 DIAGNOSIS — M25551 Pain in right hip: Secondary | ICD-10-CM | POA: Diagnosis not present

## 2024-06-12 DIAGNOSIS — M25552 Pain in left hip: Secondary | ICD-10-CM | POA: Diagnosis not present

## 2024-06-21 DIAGNOSIS — M25552 Pain in left hip: Secondary | ICD-10-CM | POA: Diagnosis not present

## 2024-06-21 DIAGNOSIS — M00162 Pneumococcal arthritis, left knee: Secondary | ICD-10-CM | POA: Diagnosis not present

## 2024-06-21 DIAGNOSIS — M1712 Unilateral primary osteoarthritis, left knee: Secondary | ICD-10-CM | POA: Diagnosis not present

## 2024-06-21 DIAGNOSIS — M25551 Pain in right hip: Secondary | ICD-10-CM | POA: Diagnosis not present

## 2024-06-21 DIAGNOSIS — M76899 Other specified enthesopathies of unspecified lower limb, excluding foot: Secondary | ICD-10-CM | POA: Diagnosis not present

## 2024-06-21 DIAGNOSIS — E859 Amyloidosis, unspecified: Secondary | ICD-10-CM | POA: Diagnosis not present

## 2024-06-25 ENCOUNTER — Encounter: Payer: Self-pay | Admitting: Internal Medicine

## 2024-06-25 DIAGNOSIS — M4316 Spondylolisthesis, lumbar region: Secondary | ICD-10-CM | POA: Diagnosis not present

## 2024-06-25 DIAGNOSIS — Z6829 Body mass index (BMI) 29.0-29.9, adult: Secondary | ICD-10-CM | POA: Diagnosis not present

## 2024-06-25 DIAGNOSIS — E854 Organ-limited amyloidosis: Secondary | ICD-10-CM

## 2024-07-16 ENCOUNTER — Encounter (HOSPITAL_COMMUNITY)

## 2024-07-27 DIAGNOSIS — M48061 Spinal stenosis, lumbar region without neurogenic claudication: Secondary | ICD-10-CM | POA: Diagnosis not present

## 2024-07-27 DIAGNOSIS — S76311D Strain of muscle, fascia and tendon of the posterior muscle group at thigh level, right thigh, subsequent encounter: Secondary | ICD-10-CM | POA: Diagnosis not present

## 2024-07-27 DIAGNOSIS — M7918 Myalgia, other site: Secondary | ICD-10-CM | POA: Diagnosis not present

## 2024-07-29 ENCOUNTER — Ambulatory Visit (HOSPITAL_COMMUNITY): Admit: 2024-07-29 | Admitting: Orthopedic Surgery

## 2024-07-29 SURGERY — ARTHROPLASTY, KNEE, TOTAL
Anesthesia: Choice | Site: Knee | Laterality: Left

## 2024-07-31 DIAGNOSIS — M75101 Unspecified rotator cuff tear or rupture of right shoulder, not specified as traumatic: Secondary | ICD-10-CM | POA: Diagnosis not present

## 2024-07-31 DIAGNOSIS — G5603 Carpal tunnel syndrome, bilateral upper limbs: Secondary | ICD-10-CM | POA: Diagnosis not present

## 2024-07-31 DIAGNOSIS — M79642 Pain in left hand: Secondary | ICD-10-CM | POA: Diagnosis not present

## 2024-07-31 DIAGNOSIS — I82409 Acute embolism and thrombosis of unspecified deep veins of unspecified lower extremity: Secondary | ICD-10-CM | POA: Diagnosis not present

## 2024-07-31 DIAGNOSIS — E859 Amyloidosis, unspecified: Secondary | ICD-10-CM | POA: Diagnosis not present

## 2024-07-31 DIAGNOSIS — M13831 Other specified arthritis, right wrist: Secondary | ICD-10-CM | POA: Diagnosis not present

## 2024-08-01 ENCOUNTER — Other Ambulatory Visit (HOSPITAL_COMMUNITY): Payer: Self-pay | Admitting: Cardiology

## 2024-08-01 DIAGNOSIS — G63 Polyneuropathy in diseases classified elsewhere: Secondary | ICD-10-CM

## 2024-08-02 ENCOUNTER — Inpatient Hospital Stay (HOSPITAL_COMMUNITY): Admission: RE | Admit: 2024-08-02 | Source: Ambulatory Visit

## 2024-08-02 ENCOUNTER — Ambulatory Visit (HOSPITAL_COMMUNITY)
Admission: RE | Admit: 2024-08-02 | Discharge: 2024-08-02 | Disposition: A | Discharge status: Home / Self Care | Source: Ambulatory Visit | Attending: Internal Medicine | Admitting: Internal Medicine

## 2024-08-02 DIAGNOSIS — G63 Polyneuropathy in diseases classified elsewhere: Secondary | ICD-10-CM | POA: Diagnosis not present

## 2024-08-02 DIAGNOSIS — E851 Neuropathic heredofamilial amyloidosis: Secondary | ICD-10-CM | POA: Diagnosis not present

## 2024-08-02 MED ORDER — VUTRISIRAN SODIUM 25 MG/0.5ML ~~LOC~~ SOSY
25.0000 mg | PREFILLED_SYRINGE | SUBCUTANEOUS | Status: DC
  Administered 2024-08-02: 25 mg via SUBCUTANEOUS
  Filled 2024-08-02: qty 0.5

## 2024-08-07 ENCOUNTER — Encounter (HOSPITAL_COMMUNITY)

## 2024-08-13 ENCOUNTER — Other Ambulatory Visit (HOSPITAL_BASED_OUTPATIENT_CLINIC_OR_DEPARTMENT_OTHER): Payer: Self-pay

## 2024-08-13 DIAGNOSIS — E854 Organ-limited amyloidosis: Secondary | ICD-10-CM | POA: Diagnosis not present

## 2024-08-13 DIAGNOSIS — I43 Cardiomyopathy in diseases classified elsewhere: Secondary | ICD-10-CM | POA: Diagnosis not present

## 2024-08-13 DIAGNOSIS — M67911 Unspecified disorder of synovium and tendon, right shoulder: Secondary | ICD-10-CM | POA: Diagnosis not present

## 2024-08-13 DIAGNOSIS — G5603 Carpal tunnel syndrome, bilateral upper limbs: Secondary | ICD-10-CM | POA: Diagnosis not present

## 2024-08-13 DIAGNOSIS — E8582 Wild-type transthyretin-related (ATTR) amyloidosis: Secondary | ICD-10-CM | POA: Diagnosis not present

## 2024-08-16 ENCOUNTER — Other Ambulatory Visit (HOSPITAL_BASED_OUTPATIENT_CLINIC_OR_DEPARTMENT_OTHER): Payer: Self-pay

## 2024-08-17 ENCOUNTER — Other Ambulatory Visit (HOSPITAL_BASED_OUTPATIENT_CLINIC_OR_DEPARTMENT_OTHER): Payer: Self-pay

## 2024-08-19 ENCOUNTER — Other Ambulatory Visit: Payer: Self-pay

## 2024-08-19 ENCOUNTER — Other Ambulatory Visit (HOSPITAL_BASED_OUTPATIENT_CLINIC_OR_DEPARTMENT_OTHER): Payer: Self-pay

## 2024-08-19 MED ORDER — FLURAZEPAM HCL 30 MG PO CAPS
30.0000 mg | ORAL_CAPSULE | Freq: Every day | ORAL | 5 refills | Status: AC
  Filled 2024-08-19: qty 30, 30d supply, fill #0
  Filled 2024-09-30: qty 30, 30d supply, fill #1
  Filled 2024-11-14: qty 30, 30d supply, fill #2

## 2024-08-20 ENCOUNTER — Other Ambulatory Visit: Payer: Self-pay

## 2024-08-20 ENCOUNTER — Other Ambulatory Visit (HOSPITAL_BASED_OUTPATIENT_CLINIC_OR_DEPARTMENT_OTHER): Payer: Self-pay

## 2024-08-21 ENCOUNTER — Other Ambulatory Visit (HOSPITAL_BASED_OUTPATIENT_CLINIC_OR_DEPARTMENT_OTHER): Payer: Self-pay

## 2024-08-21 MED ORDER — FLUZONE HIGH-DOSE 0.5 ML IM SUSY
0.5000 mL | PREFILLED_SYRINGE | Freq: Once | INTRAMUSCULAR | 0 refills | Status: AC
  Filled 2024-08-21: qty 0.5, 1d supply, fill #0

## 2024-08-21 MED ORDER — SPIKEVAX 50 MCG/0.5ML IM SUSY: PREFILLED_SYRINGE | INTRAMUSCULAR | 0 refills | Status: DC

## 2024-08-26 ENCOUNTER — Encounter: Payer: Self-pay | Admitting: Orthopaedic Surgery

## 2024-08-26 ENCOUNTER — Encounter: Payer: Self-pay | Admitting: Internal Medicine

## 2024-08-26 ENCOUNTER — Other Ambulatory Visit (INDEPENDENT_AMBULATORY_CARE_PROVIDER_SITE_OTHER): Payer: Self-pay

## 2024-08-26 ENCOUNTER — Ambulatory Visit (INDEPENDENT_AMBULATORY_CARE_PROVIDER_SITE_OTHER): Admitting: Orthopaedic Surgery

## 2024-08-26 DIAGNOSIS — M25552 Pain in left hip: Secondary | ICD-10-CM | POA: Diagnosis not present

## 2024-08-26 DIAGNOSIS — G8929 Other chronic pain: Secondary | ICD-10-CM | POA: Diagnosis not present

## 2024-08-26 DIAGNOSIS — I5032 Chronic diastolic (congestive) heart failure: Secondary | ICD-10-CM

## 2024-08-26 DIAGNOSIS — M79605 Pain in left leg: Secondary | ICD-10-CM

## 2024-08-26 DIAGNOSIS — M79604 Pain in right leg: Secondary | ICD-10-CM

## 2024-08-26 DIAGNOSIS — M25561 Pain in right knee: Secondary | ICD-10-CM | POA: Diagnosis not present

## 2024-08-26 MED ORDER — LIDOCAINE HCL 1 % IJ SOLN
3.0000 mL | INTRAMUSCULAR | Status: AC | PRN
  Administered 2024-08-26: 3 mL

## 2024-08-26 MED ORDER — METHYLPREDNISOLONE ACETATE 40 MG/ML IJ SUSP
40.0000 mg | INTRAMUSCULAR | Status: AC | PRN
  Administered 2024-08-26: 40 mg via INTRA_ARTICULAR

## 2024-08-26 NOTE — Progress Notes (Signed)
 Nathan [name] is a very pleasant 78 year old gentleman that I am seeing for the first time.  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  This type of amyloidosis does create issues such as carpal tunnel syndrome and triggering of fingers.  It can cause significant problems with joints from a orthopedic standpoint with significant musculoskeletal aches and pains and stiffness as well.  He is on blood thinning medication as well.  He had been scheduled at 1 point for a total shoulder replacement but physical therapy extensively on his shoulder helped dissipate his pain to the point where he deferred surgery on the shoulder appropriately.  He was scheduled in August I believe for a total knee arthroplasty by Dr. Hiram with [facility] and it sounds like this surgery was canceled due to the complexity of the patient's medical issues.  He comes in today with pain radiating to the ischial area on both sides with denying any groin pain but it does radiate to the back of his legs.  He still deals with right knee pain as well.  There is a long and thorough note from Dr. Ozell Gatlin Khouri from the [facility] amyloid clinic.  There was a thorough and extensive note from cardiology and the electronic epic chart to read.  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  They were planning to also order EMG and nerve conduction studies to assess neuropathic involvement and even talked about participation in the future and monoclonal antibody therapy.  On my exam today both hips have some stiffness with rotation but no blocks to rotation at all and no pain in the groin.  When you see him walk he is very stiff in his gait and and this creates decreased motion of his  lumbar spine.  He does have slight varus malalignment of his right knee and it is a painful knee.  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  I can see the top of the hips on both sides in terms of the femoral head and the joint space seems to be maintained.  He does have MRI reports with him of both hips showing just some mild arthritic changes.  We did discuss a steroid injection in his right knee today and his left hip trochanteric area since these were the areas that are causing him the most discomfort right now.  We did place an injection in his right knee joint with a steroid and his left hip trochanteric area with a steroid.  And he tolerated these well.  I would like to see him back in 3 weeks and at that visit would like a standing AP and lateral of his right knee.  We can then further discuss the potential at some point for a right knee replacement as well as further treatment options.  He has been very pleased with his physical therapist at [facility] in the past and would likely still benefit from physical therapy with that person.    Procedure Note  Patient: Nathan [name]  Date of Birth: 1946-05-29           MRN: [id number]             Visit Date: 08/26/2024  Procedures: Visit Diagnoses:  1. Bilateral leg pain   2. Chronic pain of right knee   3. Pain of left hip     Large Joint Inj: R knee on 08/26/2024 4:54 PM Indications: diagnostic evaluation and pain Details: 22 G 1.5 in needle, superolateral approach  Arthrogram: No  Medications: 3 mL lidocaine  1 %; 40 mg methylPREDNISolone  acetate 40 MG/ML Outcome: tolerated well, no immediate complications Procedure, treatment alternatives, risks and benefits explained, specific risks discussed. Consent was given by the patient. Immediately  prior to procedure a time out was called to verify the correct patient, procedure, equipment, support staff and site/side marked as required. Patient was prepped and draped in the usual sterile fashion.    Large Joint Inj: L greater trochanter on 08/26/2024 4:54 PM Indications: pain and diagnostic evaluation Details: 22 G 1.5 in needle, lateral approach  Arthrogram: No  Medications: 3 mL lidocaine  1 %; 40 mg methylPREDNISolone  acetate 40 MG/ML Outcome: tolerated well, no immediate complications Procedure, treatment alternatives, risks and benefits explained, specific risks discussed. Consent was given by the patient. Immediately prior to procedure a time out was called to verify the correct patient, procedure, equipment, support staff and site/side marked as required. Patient was prepped and draped in the usual sterile fashion.

## 2024-08-27 ENCOUNTER — Other Ambulatory Visit (HOSPITAL_BASED_OUTPATIENT_CLINIC_OR_DEPARTMENT_OTHER): Payer: Self-pay

## 2024-08-27 MED ORDER — EMPAGLIFLOZIN 10 MG PO TABS
10.0000 mg | ORAL_TABLET | Freq: Every day | ORAL | 3 refills | Status: AC
  Filled 2024-08-27 – 2024-08-31 (×3): qty 90, 90d supply, fill #0
  Filled 2024-09-09: qty 30, 30d supply, fill #0
  Filled 2024-10-01: qty 90, 90d supply, fill #1
  Filled 2024-12-23: qty 90, 90d supply, fill #2

## 2024-08-27 MED ORDER — EMPAGLIFLOZIN 10 MG PO TABS
10.0000 mg | ORAL_TABLET | Freq: Every day | ORAL | Status: DC
Start: 2024-08-27 — End: 2024-10-17

## 2024-08-27 NOTE — Telephone Encounter (Signed)
 Medication name/dosage: Samples List: Jardiance  10 mg  Administration instructions: Take 1 by mouth daily before breakfast  Reason for samples: Reason for samples: new start  Ordering provider:Mahesh Santo, MD  Completed by Hamp, RN, left in Coumadin clinic lock cabinet for pick up.

## 2024-08-29 ENCOUNTER — Telehealth: Payer: Self-pay | Admitting: Pharmacy Technician

## 2024-08-29 ENCOUNTER — Other Ambulatory Visit (HOSPITAL_BASED_OUTPATIENT_CLINIC_OR_DEPARTMENT_OTHER): Payer: Self-pay

## 2024-08-29 NOTE — Telephone Encounter (Signed)
  Pharmacy Patient Advocate Encounter   Received notification from Onbase that prior authorization for jardiance  is required/requested.   Insurance verification completed.   The patient is insured through [facility] .   Per test claim: PA required; PA submitted to above mentioned insurance via Latent Key/confirmation #/EOC [id number] Status is pending   Pharmacy Patient Advocate Encounter  Received notification from [facility] [facility] that Prior Authorization for jardiance  has been APPROVED from 08/29/24 to 08/29/25   PA #/Case ID/Reference #: [id number]

## 2024-08-30 ENCOUNTER — Other Ambulatory Visit (HOSPITAL_BASED_OUTPATIENT_CLINIC_OR_DEPARTMENT_OTHER): Payer: Self-pay

## 2024-08-31 ENCOUNTER — Other Ambulatory Visit (HOSPITAL_BASED_OUTPATIENT_CLINIC_OR_DEPARTMENT_OTHER): Payer: Self-pay

## 2024-08-31 ENCOUNTER — Other Ambulatory Visit: Payer: Self-pay

## 2024-09-03 ENCOUNTER — Other Ambulatory Visit (HOSPITAL_BASED_OUTPATIENT_CLINIC_OR_DEPARTMENT_OTHER): Payer: Self-pay

## 2024-09-09 ENCOUNTER — Other Ambulatory Visit (HOSPITAL_BASED_OUTPATIENT_CLINIC_OR_DEPARTMENT_OTHER): Payer: Self-pay

## 2024-09-11 ENCOUNTER — Other Ambulatory Visit (HOSPITAL_COMMUNITY)

## 2024-09-13 DIAGNOSIS — I5032 Chronic diastolic (congestive) heart failure: Secondary | ICD-10-CM | POA: Diagnosis not present

## 2024-09-14 LAB — BASIC METABOLIC PANEL WITH GFR
BUN/Creatinine Ratio: 21 (ref 10–24)
BUN: 21 mg/dL (ref 8–27)
CO2: 22 mmol/L (ref 20–29)
Calcium: 9.1 mg/dL (ref 8.6–10.2)
Chloride: 103 mmol/L (ref 96–106)
Creatinine, Ser: 1.01 mg/dL (ref 0.76–1.27)
Glucose: 93 mg/dL (ref 70–99)
Potassium: 4.2 mmol/L (ref 3.5–5.2)
Sodium: 140 mmol/L (ref 134–144)
eGFR: 76 mL/min/1.73 (ref >59)

## 2024-09-14 LAB — PRO B NATRIURETIC PEPTIDE: NT-Pro BNP: 231 pg/mL (ref 0–486)

## 2024-09-15 ENCOUNTER — Ambulatory Visit: Payer: Self-pay | Admitting: Internal Medicine

## 2024-09-16 ENCOUNTER — Ambulatory Visit: Admitting: Orthopaedic Surgery

## 2024-09-16 ENCOUNTER — Other Ambulatory Visit (INDEPENDENT_AMBULATORY_CARE_PROVIDER_SITE_OTHER): Payer: Self-pay

## 2024-09-16 ENCOUNTER — Encounter: Payer: Self-pay | Admitting: Orthopaedic Surgery

## 2024-09-16 DIAGNOSIS — M1711 Unilateral primary osteoarthritis, right knee: Secondary | ICD-10-CM | POA: Diagnosis not present

## 2024-09-16 DIAGNOSIS — M25561 Pain in right knee: Secondary | ICD-10-CM | POA: Diagnosis not present

## 2024-09-16 DIAGNOSIS — G8929 Other chronic pain: Secondary | ICD-10-CM | POA: Diagnosis not present

## 2024-09-16 DIAGNOSIS — M1712 Unilateral primary osteoarthritis, left knee: Secondary | ICD-10-CM | POA: Diagnosis not present

## 2024-09-16 DIAGNOSIS — M25562 Pain in left knee: Secondary | ICD-10-CM | POA: Diagnosis not present

## 2024-09-16 NOTE — Progress Notes (Signed)
 The patient comes in today stating that the left hip trochanteric injection helped him quite a bit and he is pain-free in that area but the right knee injection did not help much.  Today we are x-raying both his knees.  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  He does see a neurologist coming up soon.  He has a long and complex history and his local primary care physician is Dr. Loreli.  He does have notes from [facility] as it relates to his heart.  He was originally going to have a left knee replacement but that was canceled by one of our colleagues in town.  A steroid injection in his right knee did not really help much recently.  His left knee hurts much more than the right knee.  Examination of his left knee shows varus malalignment.  The left knee has significant medial joint line tenderness and patellofemoral crepitation.  The right knee has more neutral alignment and more lateral joint line tenderness and patellofemoral crepitation.  X-rays the left knee show severe end-stage arthritis with bone-on-bone wear of the medial compartment and patellofemoral joint.  The right knee also has significant arthritic changes more lateral and patellofemoral.  We will see him back in 6 weeks and at that point discuss again scheduling him for a left knee replacement of the first year.  He is on Xarelto  and will likely need to be bridged with Lovenox.  We will continue to review all of his notes within epic as relates stable his overall health status as we consider preparing him for knee replacement surgery for the left knee.

## 2024-09-18 ENCOUNTER — Telehealth: Payer: Self-pay | Admitting: Pharmacy Technician

## 2024-09-18 NOTE — Telephone Encounter (Signed)
 Pharmacy Patient Advocate Encounter   Received notification from CoverMyMeds that prior authorization for vyndamax  is required/requested.   Insurance verification completed.   The patient is insured through [facility].   Per test claim: PA required; PA submitted to above mentioned insurance via Latent Key/confirmation #/EOC [facility] Status is pending

## 2024-09-18 NOTE — Telephone Encounter (Signed)
 Pharmacy Patient Advocate Encounter  Received notification from [facility] that Prior Authorization for vyndamax  has been APPROVED from 09/18/24 to 09/18/25   PA #/Case ID/Reference #: [id number]

## 2024-09-19 ENCOUNTER — Ambulatory Visit: Admitting: Internal Medicine

## 2024-09-23 DIAGNOSIS — R799 Abnormal finding of blood chemistry, unspecified: Secondary | ICD-10-CM | POA: Diagnosis not present

## 2024-09-23 DIAGNOSIS — R2689 Other abnormalities of gait and mobility: Secondary | ICD-10-CM | POA: Diagnosis not present

## 2024-09-23 DIAGNOSIS — G5603 Carpal tunnel syndrome, bilateral upper limbs: Secondary | ICD-10-CM | POA: Diagnosis not present

## 2024-09-23 DIAGNOSIS — M4807 Spinal stenosis, lumbosacral region: Secondary | ICD-10-CM | POA: Diagnosis not present

## 2024-09-23 DIAGNOSIS — G629 Polyneuropathy, unspecified: Secondary | ICD-10-CM | POA: Diagnosis not present

## 2024-09-24 ENCOUNTER — Other Ambulatory Visit: Payer: Self-pay | Admitting: Internal Medicine

## 2024-09-24 DIAGNOSIS — E785 Hyperlipidemia, unspecified: Secondary | ICD-10-CM

## 2024-09-24 DIAGNOSIS — I25810 Atherosclerosis of coronary artery bypass graft(s) without angina pectoris: Secondary | ICD-10-CM

## 2024-09-26 DIAGNOSIS — I10 Essential (primary) hypertension: Secondary | ICD-10-CM | POA: Diagnosis not present

## 2024-09-26 DIAGNOSIS — R7989 Other specified abnormal findings of blood chemistry: Secondary | ICD-10-CM | POA: Diagnosis not present

## 2024-09-26 DIAGNOSIS — Z125 Encounter for screening for malignant neoplasm of prostate: Secondary | ICD-10-CM | POA: Diagnosis not present

## 2024-09-26 DIAGNOSIS — E785 Hyperlipidemia, unspecified: Secondary | ICD-10-CM | POA: Diagnosis not present

## 2024-09-26 DIAGNOSIS — Z0189 Encounter for other specified special examinations: Secondary | ICD-10-CM | POA: Diagnosis not present

## 2024-09-26 DIAGNOSIS — R7301 Impaired fasting glucose: Secondary | ICD-10-CM | POA: Diagnosis not present

## 2024-09-27 DIAGNOSIS — R2689 Other abnormalities of gait and mobility: Secondary | ICD-10-CM | POA: Diagnosis not present

## 2024-09-27 DIAGNOSIS — M4807 Spinal stenosis, lumbosacral region: Secondary | ICD-10-CM | POA: Diagnosis not present

## 2024-09-30 ENCOUNTER — Other Ambulatory Visit (HOSPITAL_BASED_OUTPATIENT_CLINIC_OR_DEPARTMENT_OTHER): Payer: Self-pay

## 2024-09-30 MED ORDER — COMIRNATY 30 MCG/0.3ML IM SUSY
0.3000 mL | PREFILLED_SYRINGE | Freq: Once | INTRAMUSCULAR | 0 refills | Status: AC
  Filled 2024-09-30: qty 0.3, 1d supply, fill #0

## 2024-10-01 ENCOUNTER — Other Ambulatory Visit (HOSPITAL_BASED_OUTPATIENT_CLINIC_OR_DEPARTMENT_OTHER): Payer: Self-pay

## 2024-10-03 ENCOUNTER — Other Ambulatory Visit (HOSPITAL_BASED_OUTPATIENT_CLINIC_OR_DEPARTMENT_OTHER): Payer: Self-pay

## 2024-10-03 DIAGNOSIS — R400 Somnolence: Secondary | ICD-10-CM | POA: Diagnosis not present

## 2024-10-03 DIAGNOSIS — Z0000 Encounter for general adult medical examination without abnormal findings: Secondary | ICD-10-CM | POA: Diagnosis not present

## 2024-10-03 DIAGNOSIS — I350 Nonrheumatic aortic (valve) stenosis: Secondary | ICD-10-CM | POA: Diagnosis not present

## 2024-10-03 DIAGNOSIS — R5382 Chronic fatigue, unspecified: Secondary | ICD-10-CM | POA: Diagnosis not present

## 2024-10-03 DIAGNOSIS — R7301 Impaired fasting glucose: Secondary | ICD-10-CM | POA: Diagnosis not present

## 2024-10-03 DIAGNOSIS — E785 Hyperlipidemia, unspecified: Secondary | ICD-10-CM | POA: Diagnosis not present

## 2024-10-03 DIAGNOSIS — G629 Polyneuropathy, unspecified: Secondary | ICD-10-CM | POA: Diagnosis not present

## 2024-10-03 DIAGNOSIS — Z1331 Encounter for screening for depression: Secondary | ICD-10-CM | POA: Diagnosis not present

## 2024-10-03 DIAGNOSIS — Z1339 Encounter for screening examination for other mental health and behavioral disorders: Secondary | ICD-10-CM | POA: Diagnosis not present

## 2024-10-03 DIAGNOSIS — R82998 Other abnormal findings in urine: Secondary | ICD-10-CM | POA: Diagnosis not present

## 2024-10-03 DIAGNOSIS — M25562 Pain in left knee: Secondary | ICD-10-CM | POA: Diagnosis not present

## 2024-10-03 DIAGNOSIS — E8582 Wild-type transthyretin-related (ATTR) amyloidosis: Secondary | ICD-10-CM | POA: Diagnosis not present

## 2024-10-03 DIAGNOSIS — I872 Venous insufficiency (chronic) (peripheral): Secondary | ICD-10-CM | POA: Diagnosis not present

## 2024-10-03 DIAGNOSIS — G720 Drug-induced myopathy: Secondary | ICD-10-CM | POA: Diagnosis not present

## 2024-10-03 DIAGNOSIS — I25810 Atherosclerosis of coronary artery bypass graft(s) without angina pectoris: Secondary | ICD-10-CM | POA: Diagnosis not present

## 2024-10-03 DIAGNOSIS — I10 Essential (primary) hypertension: Secondary | ICD-10-CM | POA: Diagnosis not present

## 2024-10-07 ENCOUNTER — Ambulatory Visit (HOSPITAL_COMMUNITY)
Admission: RE | Admit: 2024-10-07 | Discharge: 2024-10-07 | Disposition: A | Discharge status: Home / Self Care | Source: Ambulatory Visit | Attending: Internal Medicine | Admitting: Internal Medicine

## 2024-10-07 DIAGNOSIS — I350 Nonrheumatic aortic (valve) stenosis: Secondary | ICD-10-CM | POA: Diagnosis not present

## 2024-10-07 LAB — ECHOCARDIOGRAM COMPLETE
AR max vel: 1.24 cm2
AV Area VTI: 1.24 cm2
AV Area mean vel: 1.09 cm2
AV Mean grad: 20 mmHg
AV Peak grad: 36.5 mmHg
Ao pk vel: 3.02 m/s
Area-P 1/2: 2.07 cm2
S' Lateral: 2.36 cm

## 2024-10-09 ENCOUNTER — Encounter: Payer: Self-pay | Admitting: Internal Medicine

## 2024-10-09 ENCOUNTER — Telehealth: Payer: Self-pay | Admitting: Internal Medicine

## 2024-10-09 DIAGNOSIS — R2689 Other abnormalities of gait and mobility: Secondary | ICD-10-CM | POA: Diagnosis not present

## 2024-10-09 NOTE — Telephone Encounter (Signed)
 Pt is calling for information on how to schedule Amvuttra  injections. Infusion center is telling him he has to schedule through us , pt confused. Please advise.

## 2024-10-10 ENCOUNTER — Other Ambulatory Visit: Payer: Self-pay | Admitting: Pharmacist

## 2024-10-11 ENCOUNTER — Encounter (HOSPITAL_COMMUNITY): Payer: Self-pay | Admitting: Internal Medicine

## 2024-10-11 NOTE — Addendum Note (Signed)
 Addended by: Aynsley Fleet D on: 10/11/2024 12:34 PM   Modules accepted: Orders

## 2024-10-14 ENCOUNTER — Encounter: Payer: Self-pay | Admitting: Internal Medicine

## 2024-10-14 ENCOUNTER — Encounter: Payer: Self-pay | Admitting: Radiology

## 2024-10-15 DIAGNOSIS — R2689 Other abnormalities of gait and mobility: Secondary | ICD-10-CM | POA: Diagnosis not present

## 2024-10-16 ENCOUNTER — Other Ambulatory Visit: Payer: Self-pay | Admitting: Internal Medicine

## 2024-10-17 ENCOUNTER — Ambulatory Visit: Attending: Internal Medicine | Admitting: Internal Medicine

## 2024-10-17 VITALS — BP 124/76 | HR 82 | Ht 68.0 in | Wt 196.0 lb

## 2024-10-17 DIAGNOSIS — E854 Organ-limited amyloidosis: Secondary | ICD-10-CM

## 2024-10-17 DIAGNOSIS — I25810 Atherosclerosis of coronary artery bypass graft(s) without angina pectoris: Secondary | ICD-10-CM

## 2024-10-17 DIAGNOSIS — I350 Nonrheumatic aortic (valve) stenosis: Secondary | ICD-10-CM

## 2024-10-17 DIAGNOSIS — I5032 Chronic diastolic (congestive) heart failure: Secondary | ICD-10-CM

## 2024-10-17 DIAGNOSIS — I43 Cardiomyopathy in diseases classified elsewhere: Secondary | ICD-10-CM

## 2024-10-17 DIAGNOSIS — R2689 Other abnormalities of gait and mobility: Secondary | ICD-10-CM | POA: Diagnosis not present

## 2024-10-17 DIAGNOSIS — E785 Hyperlipidemia, unspecified: Secondary | ICD-10-CM

## 2024-10-17 MED ORDER — FUROSEMIDE 20 MG PO TABS: 20.0000 mg | ORAL_TABLET | Freq: Every day | ORAL | 3 refills | Status: DC | PRN

## 2024-10-17 NOTE — Patient Instructions (Signed)
 Medication Instructions:  Your physician has recommended you make the following change in your medication:  1) START taking Lasix  (furosemide ) 20 mg once daily as needed for swelling  *If you need a refill on your cardiac medications before your next appointment, please call your pharmacy*  Follow-Up: At [facility], you and your health needs are our priority.  As part of our continuing mission to provide you with exceptional heart care, our providers are all part of one team.  This team includes your primary Cardiologist (physician) and Advanced Practice Providers or APPs (Physician Assistants and Nurse Practitioners) who all work together to provide you with the care you need, when you need it.  Your next appointment:   6 months  Provider:   Stanly DELENA Leavens, MD

## 2024-10-17 NOTE — Progress Notes (Signed)
 Cardiology Office Note:  .    Date:  10/17/2024  ID:  Nathan [name], DOB 1946-01-19, MRN [id number] PCP: Loreli Elsie [name], MD  [facility] Providers Cardiologist:  Stanly DELENA Leavens, MD     CC: Follow up   History of Present Illness: .    Nathan [name] is a 78 y.o. 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  Nathan [name] is a 78 year old male with ATTR cardiac amyloidosis and coronary artery disease who presents with concerns about aortic stenosis and associated symptoms.  He experiences shortness of breath and weight gain. An echocardiogram revealed moderate aortic stenosis. He has a history of coronary artery disease and is status post coronary artery bypass grafting (CABG).  He notes swelling in his knees and ankles, along with significant fatigue. He often needs to sit down due to fatigue and sometimes falls asleep for two hours despite having a good night's sleep. He has balance issues, having failed a balance test, and has been referred back to physical therapy, which has shown improvement after two sessions. His hand doctor advised against physical therapy for his wrists due to their poor condition.  He is currently on Jardiance , which was added to his regimen. He is also on Tafamidis  for his cardiac amyloidosis.  He is concerned about his triglyceride levels, which were elevated despite fasting. He discusses dietary changes and potential medication options if levels remain high.  He shares apprehension about his future health, particularly regarding his quality of life and the possibility of becoming bedridden. He reflects on his life experiences and wants to avoid prolonged suffering.  Discussed the use of AI scribe software for clinical note transcription with the patient, who gave verbal consent to proceed.  Relevant histories: .  Social  -  funded studies in rare disease, goal is to travel more - works at [employer]  - partner deceased 10 years prior ROS: As per HPI.   Studies Reviewed: .     Cardiac Studies & Procedures   ______________________________________________________________________________________________   STRESS TESTS  MYOCARDIAL PERFUSION IMAGING 07/15/2015  Interpretation Summary  Nuclear stress EF: 63%.  There was no ST segment deviation noted during stress.  The study is normal.  This is a low risk study.  The left ventricular ejection fraction is normal (55-65%).  Normal nuclear study with no prior scar or ischemia.   ECHOCARDIOGRAM  ECHOCARDIOGRAM COMPLETE 10/07/2024  Narrative ECHOCARDIOGRAM REPORT    Patient Name:   Nathan [name] Date of Exam: 10/07/2024 Medical Rec #:  [id number]     Height:       68.0 in Accession #:    [id number]    Weight:       192.0 lb Date of Birth:  07-13-1946    BSA:          2.009 m Patient Age:    5 years      BP:           129/78 mmHg Patient Gender: M             HR:           83 bpm. Exam Location:  [address]  Procedure: 2D Echo, 3D Echo and Strain Analysis (Both Spectral and Color Flow Doppler were utilized during procedure).  Indications:    I35.0 Nonrheumatic aortic (valve) stenosis  History:  Patient has prior history of Echocardiogram examinations, most recent 10/12/2023. CAD, Signs/Symptoms:Fatigue; Risk Factors:Hypertension and Dyslipidemia. Palpitations. Cardiac amyloidosis.  Sonographer:    Jon Hacker RCS Referring Phys: [id number] [facility] A Martesha Niedermeier  IMPRESSIONS   1. Left ventricular ejection fraction, by estimation, is 65 to 70%. The left ventricle has normal function. The left ventricle has no regional wall motion abnormalities. There is moderate left ventricular hypertrophy. Left ventricular diastolic parameters are indeterminate. The average left ventricular global longitudinal strain is -23.7 %. The  global longitudinal strain is normal. 2. Right ventricular systolic function is mildly reduced. The right ventricular size is normal. There is normal pulmonary artery systolic pressure. 3. Left atrial size was mildly dilated. 4. The mitral valve is degenerative. Trivial mitral valve regurgitation. No evidence of mitral stenosis. Moderate mitral annular calcification. 5. The aortic valve is abnormal. There is severe calcifcation of the aortic valve. Aortic valve regurgitation is mild. Moderate aortic valve stenosis. Aortic valve area, by VTI measures 1.24 cm. Aortic valve mean gradient measures 20.0 mmHg. Aortic valve Vmax measures 3.02 m/s. 6. The inferior vena cava is normal in size with greater than 50% respiratory variability, suggesting right atrial pressure of 3 mmHg.  FINDINGS Left Ventricle: Left ventricular ejection fraction, by estimation, is 65 to 70%. The left ventricle has normal function. The left ventricle has no regional wall motion abnormalities. The average left ventricular global longitudinal strain is -23.7 %. Strain was performed and the global longitudinal strain is normal. 3D ejection fraction reviewed and evaluated as part of the interpretation. Alternate measurement of EF is felt to be most reflective of LV function. The left ventricular internal cavity size was normal in size. There is moderate left ventricular hypertrophy. Left ventricular diastolic parameters are indeterminate.  Right Ventricle: The right ventricular size is normal. No increase in right ventricular wall thickness. Right ventricular systolic function is mildly reduced. There is normal pulmonary artery systolic pressure. The tricuspid regurgitant velocity is 2.00 m/s, and with an assumed right atrial pressure of 3 mmHg, the estimated right ventricular systolic pressure is 19.0 mmHg.  Left Atrium: Left atrial size was mildly dilated.  Right Atrium: Right atrial size was normal in size.  Pericardium: There  is no evidence of pericardial effusion.  Mitral Valve: The mitral valve is degenerative in appearance. Moderate mitral annular calcification. Trivial mitral valve regurgitation. No evidence of mitral valve stenosis. The mean mitral valve gradient is 3.0 mmHg with average heart rate of 80 bpm.  Tricuspid Valve: The tricuspid valve is normal in structure. Tricuspid valve regurgitation is trivial. No evidence of tricuspid stenosis.  Aortic Valve: The aortic valve is abnormal. There is severe calcifcation of the aortic valve. Aortic valve regurgitation is mild. Moderate aortic stenosis is present. Aortic valve mean gradient measures 20.0 mmHg. Aortic valve peak gradient measures 36.5 mmHg. Aortic valve area, by VTI measures 1.24 cm.  Pulmonic Valve: The pulmonic valve was normal in structure. Pulmonic valve regurgitation is mild. No evidence of pulmonic stenosis.  Aorta: The aortic root is normal in size and structure. Ascending aorta measurements are within normal limits for age when indexed to body surface area.  Venous: The inferior vena cava is normal in size with greater than 50% respiratory variability, suggesting right atrial pressure of 3 mmHg.  IAS/Shunts: No atrial level shunt detected by color flow Doppler.  Additional Comments: 3D was performed not requiring image post processing on an independent workstation and was normal.   LEFT VENTRICLE PLAX 2D LVIDd:  3.54 cm   Diastology LVIDs:         2.36 cm   LV e' medial:    6.31 cm/s LV PW:         1.45 cm   LV E/e' medial:  16.2 LV IVS:        1.46 cm   LV e' lateral:   8.92 cm/s LVOT diam:     2.10 cm   LV E/e' lateral: 11.4 LV SV:         67 LV SV Index:   33        2D Longitudinal Strain LVOT Area:     3.46 cm  2D Strain GLS (A4C):   -24.5 % 2D Strain GLS (A3C):   -26.2 % 2D Strain GLS (A2C):   -20.2 % 2D Strain GLS Avg:     -23.7 %  3D Volume EF: 3D EF:        59 % LV EDV:       138 ml LV ESV:       57 ml LV SV:         81 ml  RIGHT VENTRICLE RV Basal diam:  2.64 cm RV S prime:     7.40 cm/s TAPSE (M-mode): 0.7 cm  LEFT ATRIUM             Index        RIGHT ATRIUM           Index LA diam:        4.60 cm 2.29 cm/m   RA Area:     12.70 cm LA Vol (A2C):   49.1 ml 24.44 ml/m  RA Volume:   23.30 ml  11.60 ml/m LA Vol (A4C):   45.3 ml 22.55 ml/m LA Biplane Vol: 47.5 ml 23.65 ml/m AORTIC VALVE AV Area (Vmax):    1.24 cm AV Area (Vmean):   1.09 cm AV Area (VTI):     1.24 cm AV Vmax:           302.00 cm/s AV Vmean:          205.000 cm/s AV VTI:            0.540 m AV Peak Grad:      36.5 mmHg AV Mean Grad:      20.0 mmHg LVOT Vmax:         108.00 cm/s LVOT Vmean:        64.700 cm/s LVOT VTI:          0.193 m LVOT/AV VTI ratio: 0.36  AORTA Ao Root diam: 3.20 cm Ao Asc diam:  3.70 cm  MITRAL VALVE                TRICUSPID VALVE MV Area (PHT): 2.07 cm     TR Peak grad:   16.0 mmHg MV Mean grad:  3.0 mmHg     TR Vmax:        200.00 cm/s MV Decel Time: 366 msec MV E velocity: 102.00 cm/s  SHUNTS MV A velocity: 129.00 cm/s  Systemic VTI:  0.19 m MV E/A ratio:  0.79         Systemic Diam: 2.10 cm  Soyla Merck MD Electronically signed by Soyla Merck MD Signature Date/Time: 10/07/2024/2:23:07 PM    Final    MONITORS  LONG TERM MONITOR (3-14 DAYS) 11/08/2022  Narrative   Normal sinus rhythm with average heart rate 82 bpm.   9 episodes of supraventricular tachycardia with  the longest lasting 8 beats at a relatively slow rate and 1 episode lasted 13 beats at 187 bpm.   PVC burden less than 1% with 1 instance of trigeminy   No atrial fibrillation   Patch Wear Time:  10 days and 22 hours (2023-11-10T10:03:17-0500 to 2023-11-21T08:51:52-0500)  Patient had a min HR of 47 bpm, max HR of 187 bpm, and avg HR of 82 bpm. Predominant underlying rhythm was Sinus Rhythm. 1 run of Ventricular Tachycardia occurred lasting 6 beats with a max rate of 174 bpm (avg 164 bpm). 9  Supraventricular Tachycardia runs occurred, the run with the fastest interval lasting 13 beats with a max rate of 187 bpm, the longest lasting 8 beats with an avg rate of 94 bpm. Isolated SVEs were rare (<1.0%), SVE Couplets were rare (<1.0%), and SVE Triplets were rare (<1.0%). Isolated VEs were rare (<1.0%, 825), VE Triplets were rare (<1.0%, 1), and no VE Couplets were present. Ventricular Trigeminy was present.      PYP SCAN  MYOCARDIAL AMYLOID PLANAR AND SPECT 11/03/2023  Interpretation Summary   Myocardial uptake was positive for radiotracer uptake. The visual grade of myocardial uptake relative to the ribs was Grade 2 (Myocardial uptake equal to rib uptake).   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   Prior study not available for comparison.  ______________________________________________________________________________________________       Physical Exam:    VS:  BP 124/76   Pulse 82   Ht 5' 8 (1.727 m)   Wt 196 lb (88.9 kg)   SpO2 94%   BMI 29.80 kg/m    Wt Readings from Last 3 Encounters:  10/17/24 196 lb (88.9 kg)  04/29/24 192 lb (87.1 kg)  04/26/24 193 lb 1.6 oz (87.6 kg)    Gen: no distress   Neck: No JVD Cardiac: No Rubs or Gallops, systolic murmur, RRR +2 radial pulses Respiratory: Clear to auscultation bilaterally, normal effort, normal  respiratory rate GI: Soft, nontender, non-distended  MS: No  edema;  moves all extremities Integument: Skin feels warm Neuro:  At time of evaluation, alert and oriented to person/place/time/situation  Psych: Normal affect, patient feels ok  ASSESSMENT AND PLAN: .    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 Symptoms include fatigue, shortness of breath, and peripheral neuropathy. Discussed potential clinical trial for antibody  therapy targeting amyloid fibrils. Current treatment aims to slow disease progression. Prognosis is favorable with current treatment, with predicted survival over 90% in the next five years (best estimate we reviewed the data from [place] et all in 2025 on tafamadis). - lasix  20 mg PO PRN swelling - Will discuss potential clinical trial for antibody therapy with Doctor Francina and [facility])  Moderate aortic valve stenosis - Common in cardiac amyloidosis. Symptoms may include shortness of breath and fatigue. Discussed potential progression to severe stenosis and future need for TAVR. Clinical trial for Katalyst discussed, pending confirmation of compatibility with future antibody therapy. - Ordered TAVR scans for potential future interventio if sx worsen, otherwise echo in one year  Chronic diastolic heart failure Symptoms of fluid retention and swelling. Discussed use of diuretics with caution due to risk of hypotension and syncope. Current management includes monitoring and as-needed diuretic use. - Prescribed Lasix  20 mg as needed for swelling.  Coronary artery disease, status post CABG HLD Coronary artery disease,  status post CABG. Discussed dietary modifications to manage hypertriglyceridemia.- Discussed dietary modifications to reduce triglycerides. - if persistently elevated, add VASCEPA - other continue current therapy  Time:   I have spent a total of 58 minutes with the patient reviewing notes, imaging, EKGs, labs,  and examining the patient as well as establishing an assessment and plan that was discussed personally with the patient. Discussed disease state education , using shared decision making tools and cardiac modeling, and extraoplation of clinical trials. Reviewed care and plan in collaboration with [facility] and [facility].  Six months with me   Stanly Leavens, MD FASE [facility] Cardiologist [facility]  [address] [place], [address] [place], [place]  [address] [phone]  12:49 PM

## 2024-10-18 MED ORDER — TAFAMIDIS 61 MG PO CAPS: 61.0000 mg | ORAL_CAPSULE | Freq: Every day | ORAL | 1 refills | Status: AC

## 2024-10-23 ENCOUNTER — Inpatient Hospital Stay: Admitting: Hematology and Oncology

## 2024-10-23 ENCOUNTER — Other Ambulatory Visit: Payer: Self-pay | Admitting: *Deleted

## 2024-10-23 ENCOUNTER — Other Ambulatory Visit: Payer: Self-pay | Admitting: Hematology and Oncology

## 2024-10-23 ENCOUNTER — Inpatient Hospital Stay: Attending: Physician Assistant

## 2024-10-23 VITALS — BP 138/79 | HR 88 | Temp 97.9°F | Resp 14 | Wt 197.9 lb

## 2024-10-23 DIAGNOSIS — T148XXA Other injury of unspecified body region, initial encounter: Secondary | ICD-10-CM

## 2024-10-23 DIAGNOSIS — Z7901 Long term (current) use of anticoagulants: Secondary | ICD-10-CM | POA: Diagnosis not present

## 2024-10-23 DIAGNOSIS — I82462 Acute embolism and thrombosis of left calf muscular vein: Secondary | ICD-10-CM | POA: Diagnosis not present

## 2024-10-23 DIAGNOSIS — Z86718 Personal history of other venous thrombosis and embolism: Secondary | ICD-10-CM | POA: Insufficient documentation

## 2024-10-23 DIAGNOSIS — M4807 Spinal stenosis, lumbosacral region: Secondary | ICD-10-CM | POA: Diagnosis not present

## 2024-10-23 DIAGNOSIS — R2689 Other abnormalities of gait and mobility: Secondary | ICD-10-CM | POA: Diagnosis not present

## 2024-10-23 LAB — CBC WITH DIFFERENTIAL (CANCER CENTER ONLY)
Abs Immature Granulocytes: 0.02 K/uL (ref 0.00–0.07)
Basophils Absolute: 0.1 K/uL (ref 0.0–0.1)
Basophils Relative: 1 %
Eosinophils Absolute: 0.1 K/uL (ref 0.0–0.5)
Eosinophils Relative: 1 %
HCT: 39.2 % (ref 39.0–52.0)
Hemoglobin: 13.5 g/dL (ref 13.0–17.0)
Immature Granulocytes: 0 %
Lymphocytes Relative: 29 %
Lymphs Abs: 1.4 K/uL (ref 0.7–4.0)
MCH: 31.8 pg (ref 26.0–34.0)
MCHC: 34.4 g/dL (ref 30.0–36.0)
MCV: 92.5 fL (ref 80.0–100.0)
Monocytes Absolute: 0.7 K/uL (ref 0.1–1.0)
Monocytes Relative: 15 %
Neutro Abs: 2.6 K/uL (ref 1.7–7.7)
Neutrophils Relative %: 54 %
Platelet Count: 201 K/uL (ref 150–400)
RBC: 4.24 MIL/uL (ref 4.22–5.81)
RDW: 12.7 % (ref 11.5–15.5)
WBC Count: 4.9 K/uL (ref 4.0–10.5)
nRBC: 0 % (ref 0.0–0.2)

## 2024-10-23 LAB — CMP (CANCER CENTER ONLY)
ALT: 30 U/L (ref 0–44)
AST: 22 U/L (ref 15–41)
Albumin: 4 g/dL (ref 3.5–5.0)
Alkaline Phosphatase: 50 U/L (ref 38–126)
Anion gap: 6 (ref 5–15)
BUN: 18 mg/dL (ref 8–23)
CO2: 26 mmol/L (ref 22–32)
Calcium: 9.1 mg/dL (ref 8.9–10.3)
Chloride: 106 mmol/L (ref 98–111)
Creatinine: 0.84 mg/dL (ref 0.61–1.24)
GFR, Estimated: >60 mL/min (ref >60)
Glucose, Bld: 116 mg/dL — ABNORMAL HIGH (ref 70–99)
Potassium: 4 mmol/L (ref 3.5–5.1)
Sodium: 138 mmol/L (ref 135–145)
Total Bilirubin: 0.4 mg/dL (ref 0.0–1.2)
Total Protein: 6.5 g/dL (ref 6.5–8.1)

## 2024-10-23 NOTE — Progress Notes (Signed)
 [facility] [phone]   [phone]  PROGRESS NOTE  Patient Care Team: Loreli Elsie [name], MD as PCP - General (Internal Medicine) Santo Stanly LABOR, MD as PCP - Cardiology (Cardiology) Ethyl Lenis, MD as Consulting Physician (General Surgery) Tobie Tonita POUR, DO as Consulting Physician (Neurology)  Hematological/Oncological History # Left Lower Extremity DVT  02/28/2022: Lower Extremity US  showed acute deep vein thrombosis involving the left common femoral vein, SF junction, left femoral vein, left proximal profunda vein, left popliteal vein, left posterior tibial veins, and left peroneal veins. Provoked by 14 hour flight.   10/28/2022: Lower Extremity US  showed acute DVT in left peroneal veins.  11/30/2022: establish care with Dr. Federico   Interval History:  Nathan [name] 78 y.o. male with medical history significant for unprovoked LLE DVT who presents for a follow up visit. The patient's last visit was on 04/27/2023. In the interim since the last visit he has continued on Xarelto  therapy.   On exam today [name] reports he will be staying local for the holiday.  He reports that he has been well overall in the room since our last visit.  He reports he does continue to have fatigue and reports his energy is about a 6 out of 10.  He has some difficulty sleeping and is not able to stay asleep for long periods of time.  He may stay asleep for about 2 hours but then unfortunately wakes up.  He reports that he is doing his best to try to eat well.  He notes that he does have an upcoming birthday but will not be having any special celebrations and will be going to physical therapy.  He notes he is not having any bleeding, bruising, or dark stools.  He reports that he does bleed easily however if he gets bumped or nicked.  He is taking his Xarelto  10 mg p.o. daily faithfully with no difficulties.  He has no signs or symptoms concerning for recurrent VTE  such as leg pain, leg swelling, chest pain, or shortness of breath.  Overall he feels well and has no additional questions concerns or complaints today.  A full 10 point ROS is otherwise negative.   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    Blood transfusion without reported diagnosis    Complication of anesthesia    jittery after surgery   Coronary artery disease    GERD (gastroesophageal reflux disease)    History of DVT (deep vein thrombosis)    Hyperlipemia    Hypertension    IFG (impaired fasting glucose)    Right carpal tunnel syndrome 02/08/2017   Seasonal allergies    Wears glasses     SURGICAL HISTORY: Past Surgical History:  Procedure Laterality Date   APPENDECTOMY     age 12   COLONOSCOPY     COLONOSCOPY WITH PROPOFOL  N/A 02/15/2016   Procedure: COLONOSCOPY WITH PROPOFOL ;  Surgeon: Gladis POUR Louder, MD;  Location: [facility];  Service: Endoscopy;  Laterality: N/A;   CORONARY ARTERY BYPASS GRAFT     2003   ESOPHAGOGASTRODUODENOSCOPY (EGD) WITH PROPOFOL  N/A 02/15/2016   Procedure: ESOPHAGOGASTRODUODENOSCOPY (EGD) WITH PROPOFOL ;  Surgeon: Gladis POUR Louder, MD;  Location: [facility];  Service: Endoscopy;  Laterality: N/A;   EXPLORATION POST OPERATIVE OPEN HEART     EYE SURGERY     detached retina-lt   INGUINAL HERNIA  REPAIR Left 04/01/2014   Procedure: HERNIA REPAIR INGUINAL ADULT;  Surgeon: Alm VEAR Angle, MD;  Location: [facility];  Service: General;  Laterality: Left;   INSERTION OF MESH Left 04/01/2014   Procedure: INSERTION OF MESH;  Surgeon: Alm VEAR Angle, MD;  Location: [facility];  Service: General;  Laterality: Left;   KNEE ARTHROCENTESIS  1980   right   SHOULDER ACROMIOPLASTY  1985   rt   SINUS ENDO W/FUSION     TONSILLECTOMY AND ADENOIDECTOMY      SOCIAL HISTORY: Social History   Socioeconomic History   Marital  status: Widowed    Spouse name: Not on file   Number of children: Not on file   Years of education: post grad   Highest education level: Not on file  Occupational History   Occupation: [employer]  Tobacco Use   Smoking status: Former    Current packs/day: 0.00    Types: Cigarettes    Quit date: 03/26/1969    Years since quitting: 55.6   Smokeless tobacco: Never  Vaping Use   Vaping status: Never Used  Substance and Sexual Activity   Alcohol  use: Yes    Alcohol /week: 0.0 standard drinks of alcohol     Comment: 2 per day   Drug use: No   Sexual activity: Not Currently  Other Topics Concern   Not on file  Social History Narrative   Patient drinks 1-2 cups of caffeine daily.   Patient is left handed.       Are you right handed or left handed? Left Handed   Are you currently employed ? 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 Yes   Who lives with you?    What type of home do you live in: 1 story or 2 story? Lives in a two story home       Social Drivers of Health   Financial Resource Strain: Not on file  Food Insecurity: Not on file  Transportation Needs: Not on file  Physical Activity: Not on file  Stress: Not on file  Social Connections: Not on file  Intimate Partner Violence: Not on file    FAMILY HISTORY: Family History  Problem Relation Age of Onset   Heart disease Mother    Congestive Heart Failure Mother    Cancer Father    Bladder Cancer Father    Alcohol  abuse Brother    Kidney failure Brother     ALLERGIES:  is allergic to bystolic [nebivolol hcl], lexiscan  [regadenoson ], triamcinolone  acetonide acetate [triamcinolone ], zetia  [ezetimibe ], amlodipine , hydromorphone , eliquis [apixaban], lipitor [atorvastatin ], lisinopril , neosporin [neomycin-bacitracin zn-polymyx], pravastatin, and toprol xl [metoprolol tartrate].  MEDICATIONS:  Current Outpatient Medications  Medication Sig Dispense Refill   acetaminophen   (TYLENOL ) 500 MG tablet Take 1,000 mg by mouth at bedtime.     augmented betamethasone  dipropionate (DIPROLENE -AF) 0.05 % ointment Apply 1 Application topically 2 (two) times daily as needed (skin irritation (hands)).     cetirizine (ZYRTEC) 10 MG tablet Take 10 mg by mouth in the morning.     cycloSPORINE (RESTASIS) 0.05 % ophthalmic emulsion Place 1 drop into both eyes 2 (two) times daily.      empagliflozin  (JARDIANCE ) 10 MG TABS tablet Take 1 tablet (10 mg total) by mouth daily before breakfast. 90 tablet 3   Evolocumab  (REPATHA  SURECLICK) 140 MG/ML SOAJ INJECT 140 MG INTO THE SKIN EVERY 14 (FOURTEEN) DAYS. 6 mL 3  flurazepam (DALMANE ) 30 MG capsule Take 1 capsule (30 mg total) by mouth at bedtime. 30 capsule 5   furosemide  (LASIX ) 20 MG tablet Take 1 tablet (20 mg total) by mouth daily as needed. 30 tablet 3   gabapentin (NEURONTIN) 300 MG capsule Take 300 mg by mouth at bedtime.     GENTEAL 0.25-0.3 % GEL Place 1 drop into both eyes at bedtime.     Hypromellose (PURE & GENTLE LUBRICANT) 0.3 % SOLN Apply to eye daily at 6 (six) AM.     LOMOTIL 2.5-0.025 MG tablet as needed for diarrhea or loose stools.     olmesartan (BENICAR) 20 MG tablet Take 10 mg by mouth daily. Take one half (0.5) tablet by mouth (10 mg) daily.     Probiotic Product (ALIGN) 10 MG CAPS daily at 6 (six) AM.     rivaroxaban  (XARELTO ) 10 MG TABS tablet Take 1 tablet (10 mg total) by mouth daily. 90 tablet 3   SYSTANE 0.4-0.3 % SOLN Place 1 drop into both eyes at bedtime.     Tafamidis  61 MG CAPS Take 1 capsule (61 mg total) by mouth daily. 90 capsule 1   traMADol  (ULTRAM ) 50 MG tablet as needed (sleep).     triamcinolone  (NASACORT ) 55 MCG/ACT AERO nasal inhaler Place 2 sprays into the nose at bedtime.     Vitamin A 1500 MCG/ML LIQD Take 1,500 mcg by mouth daily.     vutrisiran  sodium (AMVUTTRA ) 25 MG/0.5ML syringe Inject into the skin every 3 (three) months.     Current Facility-Administered Medications  Medication Dose  Route Frequency Provider Last Rate Last Admin   vutrisiran  sodium (AMVUTTRA ) 25 MG/0.5ML syringe 25 mg  25 mg Subcutaneous Q90 days Chandrasekhar, Mahesh A, MD        REVIEW OF SYSTEMS:   Constitutional: ( - ) fevers, ( - )  chills , ( - ) night sweats Eyes: ( - ) blurriness of vision, ( - ) double vision, ( - ) watery eyes Ears, nose, mouth, throat, and face: ( - ) mucositis, ( - ) sore throat Respiratory: ( - ) cough, ( - ) dyspnea, ( - ) wheezes Cardiovascular: ( - ) palpitation, ( - ) chest discomfort, ( - ) lower extremity swelling Gastrointestinal:  ( - ) nausea, ( - ) heartburn, ( - ) change in bowel habits Skin: ( - ) abnormal skin rashes Lymphatics: ( - ) new lymphadenopathy, ( - ) easy bruising Neurological: ( - ) numbness, ( - ) tingling, ( - ) new weaknesses Behavioral/Psych: ( - ) mood change, ( - ) new changes  All other systems were reviewed with the patient and are negative.  PHYSICAL EXAMINATION: There were no vitals filed for this visit.   There were no vitals filed for this visit.    GENERAL: well appearing elderly Caucasian male, alert, no distress and comfortable SKIN: skin color, texture, turgor are normal, no rashes or significant lesions EYES: conjunctiva are pink and non-injected, sclera clear LUNGS: clear to auscultation and percussion with normal breathing effort HEART: regular rate & rhythm and no murmurs and no lower extremity edema Musculoskeletal: no cyanosis of digits and no clubbing  PSYCH: alert & oriented x 3, fluent speech NEURO: no focal motor/sensory deficits  LABORATORY DATA:  I have reviewed the data as listed    Latest Ref Rng & Units 04/26/2024    9:47 AM 10/27/2023   10:09 AM 05/30/2023   10:52 AM  CBC  WBC  4.0 - 10.5 K/uL 5.4  5.1  6.8   Hemoglobin 13.0 - 17.0 g/dL 86.7  87.1  87.1   Hematocrit 39.0 - 52.0 % 38.6  38.8  36.4   Platelets 150 - 400 K/uL 221  177  179        Latest Ref Rng & Units 09/13/2024    9:50 AM 04/26/2024     9:47 AM 01/02/2024    3:06 PM  CMP  Glucose 70 - 99 mg/dL 93  889  862   BUN 8 - 27 mg/dL 21  16  19    Creatinine 0.76 - 1.27 mg/dL 8.98  8.93  9.12   Sodium 134 - 144 mmol/L 140  139  142   Potassium 3.5 - 5.2 mmol/L 4.2  4.3  4.4   Chloride 96 - 106 mmol/L 103  105  103   CO2 20 - 29 mmol/L 22  29  25    Calcium  8.6 - 10.2 mg/dL 9.1  9.0  9.5   Total Protein 6.5 - 8.1 g/dL  6.6    Total Bilirubin 0.0 - 1.2 mg/dL  0.3    Alkaline Phos 38 - 126 U/L  43    AST 15 - 41 U/L  22    ALT 0 - 44 U/L  28      RADIOGRAPHIC STUDIES: ECHOCARDIOGRAM COMPLETE Result Date: 10/07/2024    ECHOCARDIOGRAM REPORT   Patient Name:   Nathan [name] Date of Exam: 10/07/2024 Medical Rec #:  [id number]     Height:       68.0 in Accession #:    [id number]    Weight:       192.0 lb Date of Birth:  20-May-1946    BSA:          2.009 m Patient Age:    78 years      BP:           129/78 mmHg Patient Gender: M             HR:           83 bpm. Exam Location:  [address] Procedure: 2D Echo, 3D Echo and Strain Analysis (Both Spectral and Color Flow            Doppler were utilized during procedure). Indications:    I35.0 Nonrheumatic aortic (valve) stenosis  History:        Patient has prior history of Echocardiogram examinations, most                 recent 10/12/2023. CAD, Signs/Symptoms:Fatigue; Risk                 Factors:Hypertension and Dyslipidemia. Palpitations. Cardiac                 amyloidosis.  Sonographer:    Jon Hacker RCS Referring Phys: [id number] [facility] A CHANDRASEKHAR IMPRESSIONS  1. Left ventricular ejection fraction, by estimation, is 65 to 70%. The left ventricle has normal function. The left ventricle has no regional wall motion abnormalities. There is moderate left ventricular hypertrophy. Left ventricular diastolic parameters are indeterminate. The average left ventricular global longitudinal strain is -23.7 %. The global longitudinal strain is normal.  2. Right ventricular systolic function is  mildly reduced. The right ventricular size is normal. There is normal pulmonary artery systolic pressure.  3. Left atrial size was mildly dilated.  4. The mitral valve is degenerative. Trivial mitral valve regurgitation. No evidence of mitral stenosis.  Moderate mitral annular calcification.  5. The aortic valve is abnormal. There is severe calcifcation of the aortic valve. Aortic valve regurgitation is mild. Moderate aortic valve stenosis. Aortic valve area, by VTI measures 1.24 cm. Aortic valve mean gradient measures 20.0 mmHg. Aortic valve Vmax measures 3.02 m/s.  6. The inferior vena cava is normal in size with greater than 50% respiratory variability, suggesting right atrial pressure of 3 mmHg. FINDINGS  Left Ventricle: Left ventricular ejection fraction, by estimation, is 65 to 70%. The left ventricle has normal function. The left ventricle has no regional wall motion abnormalities. The average left ventricular global longitudinal strain is -23.7 %. Strain was performed and the global longitudinal strain is normal. 3D ejection fraction reviewed and evaluated as part of the interpretation. Alternate measurement of EF is felt to be most reflective of LV function. The left ventricular internal cavity size was normal in size. There is moderate left ventricular hypertrophy. Left ventricular diastolic parameters are indeterminate. Right Ventricle: The right ventricular size is normal. No increase in right ventricular wall thickness. Right ventricular systolic function is mildly reduced. There is normal pulmonary artery systolic pressure. The tricuspid regurgitant velocity is 2.00 m/s, and with an assumed right atrial pressure of 3 mmHg, the estimated right ventricular systolic pressure is 19.0 mmHg. Left Atrium: Left atrial size was mildly dilated. Right Atrium: Right atrial size was normal in size. Pericardium: There is no evidence of pericardial effusion. Mitral Valve: The mitral valve is degenerative in  appearance. Moderate mitral annular calcification. Trivial mitral valve regurgitation. No evidence of mitral valve stenosis. The mean mitral valve gradient is 3.0 mmHg with average heart rate of 80 bpm. Tricuspid Valve: The tricuspid valve is normal in structure. Tricuspid valve regurgitation is trivial. No evidence of tricuspid stenosis. Aortic Valve: The aortic valve is abnormal. There is severe calcifcation of the aortic valve. Aortic valve regurgitation is mild. Moderate aortic stenosis is present. Aortic valve mean gradient measures 20.0 mmHg. Aortic valve peak gradient measures 36.5  mmHg. Aortic valve area, by VTI measures 1.24 cm. Pulmonic Valve: The pulmonic valve was normal in structure. Pulmonic valve regurgitation is mild. No evidence of pulmonic stenosis. Aorta: The aortic root is normal in size and structure. Ascending aorta measurements are within normal limits for age when indexed to body surface area. Venous: The inferior vena cava is normal in size with greater than 50% respiratory variability, suggesting right atrial pressure of 3 mmHg. IAS/Shunts: No atrial level shunt detected by color flow Doppler. Additional Comments: 3D was performed not requiring image post processing on an independent workstation and was normal.  LEFT VENTRICLE PLAX 2D LVIDd:         3.54 cm   Diastology LVIDs:         2.36 cm   LV e' medial:    6.31 cm/s LV PW:         1.45 cm   LV E/e' medial:  16.2 LV IVS:        1.46 cm   LV e' lateral:   8.92 cm/s LVOT diam:     2.10 cm   LV E/e' lateral: 11.4 LV SV:         67 LV SV Index:   33        2D Longitudinal Strain LVOT Area:     3.46 cm  2D Strain GLS (A4C):   -24.5 %  2D Strain GLS (A3C):   -26.2 %                          2D Strain GLS (A2C):   -20.2 %                          2D Strain GLS Avg:     -23.7 %                           3D Volume EF:                          3D EF:        59 %                          LV EDV:       138 ml                           LV ESV:       57 ml                          LV SV:        81 ml RIGHT VENTRICLE RV Basal diam:  2.64 cm RV S prime:     7.40 cm/s TAPSE (M-mode): 0.7 cm LEFT ATRIUM             Index        RIGHT ATRIUM           Index LA diam:        4.60 cm 2.29 cm/m   RA Area:     12.70 cm LA Vol (A2C):   49.1 ml 24.44 ml/m  RA Volume:   23.30 ml  11.60 ml/m LA Vol (A4C):   45.3 ml 22.55 ml/m LA Biplane Vol: 47.5 ml 23.65 ml/m  AORTIC VALVE AV Area (Vmax):    1.24 cm AV Area (Vmean):   1.09 cm AV Area (VTI):     1.24 cm AV Vmax:           302.00 cm/s AV Vmean:          205.000 cm/s AV VTI:            0.540 m AV Peak Grad:      36.5 mmHg AV Mean Grad:      20.0 mmHg LVOT Vmax:         108.00 cm/s LVOT Vmean:        64.700 cm/s LVOT VTI:          0.193 m LVOT/AV VTI ratio: 0.36  AORTA Ao Root diam: 3.20 cm Ao Asc diam:  3.70 cm MITRAL VALVE                TRICUSPID VALVE MV Area (PHT): 2.07 cm     TR Peak grad:   16.0 mmHg MV Mean grad:  3.0 mmHg     TR Vmax:        200.00 cm/s MV Decel Time: 366 msec MV E velocity: 102.00 cm/s  SHUNTS MV A velocity: 129.00 cm/s  Systemic VTI:  0.19 m MV E/A ratio:  0.79         Systemic Diam: 2.10 cm  Soyla Merck MD Electronically signed by Soyla Merck MD Signature Date/Time: 10/07/2024/2:23:07 PM    Final     ASSESSMENT & PLAN Nathan [name] is a 78 y.o.  male with medical history significant for unprovoked LLE DVT who presents for a follow up visit.   After review of the labs, review of the records, and discussion with the patient the patients findings are most consistent with recurrent DVTs, 1 provoked and 1 unprovoked.   A provoked venous thromboembolism (VTE) is one that has a clear inciting factor or event. Provoking factors include prolonged travel/immobility, surgery (particular abdominal or orthropedic), trauma,  and pregnancy/ estrogen containing birth control. After a detailed history and review of the records there is no clear provoking factor for  this patient's VTE.  Patients with unprovoked VTEs have up to 25% recurrence after 5 years and 36% at 10 years, with 4% of these clots being fatal (BMJ?2019;366:[id number]). Therefore the formal recommendation for unprovoked VTE's is lifelong anticoagulation, as the cause may not be transient or reversible. We recommend 6 months or full strength anticoagulation with a re-evaluation after that time.  The patient's will then have a choice of maintenance dose DOAC (preferred, recommended), 81mg  ASA PO daily (non-preferred), or no further anticoagulation (not recommended).     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  Lupus anticoagulant panel would be altered by presence of blood thinner, will hold on this testing.   --recommend the patient continue xarelto  10 mg PO daily maintenance dose. Refill sent.  --patient denies any bleeding, bruising, or dark stools on this medication. It is well tolerated. No difficulties accessing/affording the medication  --labs today show white blood cell 4.9, hemoglobin 13.5, MCV 92.5, platelets 201. Creatinine and LFTs are normal.  --RTC in 6 months' time with strict return precautions for overt signs of bleeding.    No orders of the defined types were placed in this encounter.  All questions were answered. The patient knows to call the clinic with any problems, questions or concerns.   I have spent a total of 25 minutes minutes of face-to-face and non-face-to-face time, preparing to see the patient,  performing a medically appropriate examination, counseling and educating the patient,documenting clinical information in the electronic health record, independently interpreting results and communicating results to the patient, and care coordination.   Norleen IVAR Kidney, MD Department of Hematology/Oncology [facility]  at [facility] Phone: [phone] Pager: [phone] Email: [email or web address]   10/23/2024 7:26 AM

## 2024-10-25 DIAGNOSIS — R2689 Other abnormalities of gait and mobility: Secondary | ICD-10-CM | POA: Diagnosis not present

## 2024-10-28 ENCOUNTER — Encounter: Payer: Self-pay | Admitting: Orthopaedic Surgery

## 2024-10-28 ENCOUNTER — Ambulatory Visit (INDEPENDENT_AMBULATORY_CARE_PROVIDER_SITE_OTHER): Admitting: Orthopaedic Surgery

## 2024-10-28 DIAGNOSIS — M25562 Pain in left knee: Secondary | ICD-10-CM

## 2024-10-28 DIAGNOSIS — G8929 Other chronic pain: Secondary | ICD-10-CM

## 2024-10-28 DIAGNOSIS — M1712 Unilateral primary osteoarthritis, left knee: Secondary | ICD-10-CM

## 2024-10-28 NOTE — Progress Notes (Signed)
 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  He has seen his hematologist and his cardiologist.  He is on chronic Xarelto .  He is interested in proceeding with knee replacement surgery for his left knee after the first the year.  We have spoken about this in length in detail and I have reviewed all of his medical history notes from cardiology as well as hematology and previous notes as well.  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  At one point he was scheduled for a left knee replacement by one of our colleagues in town but this surgery was delayed secondary to the patient's complex medical issues.  He has received clearance though for the the surgeries.  We have spoken in length in detail about this and his x-rays of the left knee were reviewed showing severe end-stage arthritis of the left knee with varus malalignment and bone-on-bone wear of the medial compartment and patellofemoral joint with osteophytes in all 3 compartments.  The right knee also has some arthritic changes more lateral than medial.  I did go over knee replacement model with him.  We discussed in detail what the surgery involves talking about the intraoperative and postoperative course and what to expect from the risks and benefits of surgery.  We would perform this over at [facility] since he is more familiar with that hospital and the cardiologist over there.  I think this is reasonable.  He is on Xarelto  but he is someone who will have to be bridged with Lovenox prior to surgery.  We would have him come off of Xarelto  5 days before surgery and go ahead and bridge with weight-based and therapeutic Lovenox leading up to surgery and then would resume Lovenox and Xarelto  immediately after surgery.  All question concerns were answered and addressed.  Will work on getting him scheduled for a left knee replacement.

## 2024-10-29 DIAGNOSIS — R2689 Other abnormalities of gait and mobility: Secondary | ICD-10-CM | POA: Diagnosis not present

## 2024-10-30 ENCOUNTER — Encounter: Payer: Self-pay | Admitting: Hematology and Oncology

## 2024-10-30 ENCOUNTER — Encounter: Payer: Self-pay | Admitting: Internal Medicine

## 2024-10-30 DIAGNOSIS — M75121 Complete rotator cuff tear or rupture of right shoulder, not specified as traumatic: Secondary | ICD-10-CM | POA: Diagnosis not present

## 2024-10-31 DIAGNOSIS — R2689 Other abnormalities of gait and mobility: Secondary | ICD-10-CM | POA: Diagnosis not present

## 2024-11-01 ENCOUNTER — Encounter: Payer: Self-pay | Admitting: *Deleted

## 2024-11-01 DIAGNOSIS — G5603 Carpal tunnel syndrome, bilateral upper limbs: Secondary | ICD-10-CM | POA: Diagnosis not present

## 2024-11-04 ENCOUNTER — Ambulatory Visit (HOSPITAL_COMMUNITY)
Admission: RE | Admit: 2024-11-04 | Discharge: 2024-11-04 | Disposition: A | Discharge status: Home / Self Care | Source: Ambulatory Visit | Attending: Internal Medicine | Admitting: Internal Medicine

## 2024-11-04 ENCOUNTER — Encounter: Payer: Self-pay | Admitting: Orthopaedic Surgery

## 2024-11-04 VITALS — BP 133/67 | HR 102 | Temp 97.1°F | Resp 16

## 2024-11-04 DIAGNOSIS — E854 Organ-limited amyloidosis: Secondary | ICD-10-CM | POA: Insufficient documentation

## 2024-11-04 DIAGNOSIS — I43 Cardiomyopathy in diseases classified elsewhere: Secondary | ICD-10-CM | POA: Diagnosis not present

## 2024-11-04 MED ORDER — VUTRISIRAN SODIUM 25 MG/0.5ML ~~LOC~~ SOSY
25.0000 mg | PREFILLED_SYRINGE | Freq: Once | SUBCUTANEOUS | Status: AC
  Administered 2024-11-04: 25 mg via SUBCUTANEOUS
  Filled 2024-11-04: qty 0.5

## 2024-11-05 DIAGNOSIS — R2689 Other abnormalities of gait and mobility: Secondary | ICD-10-CM | POA: Diagnosis not present

## 2024-11-05 DIAGNOSIS — M4807 Spinal stenosis, lumbosacral region: Secondary | ICD-10-CM | POA: Diagnosis not present

## 2024-11-08 ENCOUNTER — Other Ambulatory Visit (HOSPITAL_BASED_OUTPATIENT_CLINIC_OR_DEPARTMENT_OTHER): Payer: Self-pay

## 2024-11-08 DIAGNOSIS — M25552 Pain in left hip: Secondary | ICD-10-CM | POA: Diagnosis not present

## 2024-11-08 MED ORDER — PREDNISONE 20 MG PO TABS
40.0000 mg | ORAL_TABLET | Freq: Every day | ORAL | 0 refills | Status: DC
  Filled 2024-11-08: qty 10, 5d supply, fill #0

## 2024-11-11 ENCOUNTER — Telehealth: Payer: Self-pay | Admitting: Orthopaedic Surgery

## 2024-11-11 NOTE — Telephone Encounter (Signed)
 Pt called saying that his left hip has been giving him a problem and has been in pain. Wasn't sure if he needed to make an appt since Dr. Vernetta is pretty much booked out until next year. Says he went to an Urgent care on Fri and the report is on his Mychart. Say's he's concerned that this might effect his knee surgery that he has coming up.  He also says he was never given a true diagnosis and that bothers him. Call back number is [phone]

## 2024-11-12 NOTE — Telephone Encounter (Signed)
 Scheduled on Dr. Burnetta schedule

## 2024-11-13 DIAGNOSIS — Z961 Presence of intraocular lens: Secondary | ICD-10-CM | POA: Diagnosis not present

## 2024-11-13 DIAGNOSIS — H35373 Puckering of macula, bilateral: Secondary | ICD-10-CM | POA: Diagnosis not present

## 2024-11-13 DIAGNOSIS — H04123 Dry eye syndrome of bilateral lacrimal glands: Secondary | ICD-10-CM | POA: Diagnosis not present

## 2024-11-14 ENCOUNTER — Other Ambulatory Visit (HOSPITAL_BASED_OUTPATIENT_CLINIC_OR_DEPARTMENT_OTHER): Payer: Self-pay

## 2024-11-14 DIAGNOSIS — L720 Epidermal cyst: Secondary | ICD-10-CM | POA: Diagnosis not present

## 2024-11-14 DIAGNOSIS — D1801 Hemangioma of skin and subcutaneous tissue: Secondary | ICD-10-CM | POA: Diagnosis not present

## 2024-11-14 DIAGNOSIS — L821 Other seborrheic keratosis: Secondary | ICD-10-CM | POA: Diagnosis not present

## 2024-11-14 DIAGNOSIS — L570 Actinic keratosis: Secondary | ICD-10-CM | POA: Diagnosis not present

## 2024-11-14 DIAGNOSIS — L820 Inflamed seborrheic keratosis: Secondary | ICD-10-CM | POA: Diagnosis not present

## 2024-11-14 DIAGNOSIS — Z85828 Personal history of other malignant neoplasm of skin: Secondary | ICD-10-CM | POA: Diagnosis not present

## 2024-11-15 ENCOUNTER — Other Ambulatory Visit (HOSPITAL_BASED_OUTPATIENT_CLINIC_OR_DEPARTMENT_OTHER): Payer: Self-pay

## 2024-11-16 ENCOUNTER — Encounter (HOSPITAL_COMMUNITY): Payer: Self-pay | Admitting: Internal Medicine

## 2024-11-16 ENCOUNTER — Other Ambulatory Visit (HOSPITAL_BASED_OUTPATIENT_CLINIC_OR_DEPARTMENT_OTHER): Payer: Self-pay

## 2024-11-18 ENCOUNTER — Encounter: Payer: Self-pay | Admitting: Sports Medicine

## 2024-11-18 ENCOUNTER — Other Ambulatory Visit: Payer: Self-pay

## 2024-11-18 ENCOUNTER — Ambulatory Visit: Admitting: Sports Medicine

## 2024-11-18 DIAGNOSIS — M7062 Trochanteric bursitis, left hip: Secondary | ICD-10-CM

## 2024-11-18 DIAGNOSIS — E8582 Wild-type transthyretin-related (ATTR) amyloidosis: Secondary | ICD-10-CM | POA: Diagnosis not present

## 2024-11-18 DIAGNOSIS — M51369 Other intervertebral disc degeneration, lumbar region without mention of lumbar back pain or lower extremity pain: Secondary | ICD-10-CM | POA: Diagnosis not present

## 2024-11-18 DIAGNOSIS — G8929 Other chronic pain: Secondary | ICD-10-CM | POA: Diagnosis not present

## 2024-11-18 DIAGNOSIS — M79605 Pain in left leg: Secondary | ICD-10-CM | POA: Diagnosis not present

## 2024-11-18 DIAGNOSIS — M25552 Pain in left hip: Secondary | ICD-10-CM | POA: Diagnosis not present

## 2024-11-18 MED ORDER — BUPIVACAINE HCL 0.25 % IJ SOLN
2.0000 mL | INTRAMUSCULAR | Status: AC | PRN
  Administered 2024-11-18: 2 mL via INTRA_ARTICULAR

## 2024-11-18 MED ORDER — LIDOCAINE HCL 1 % IJ SOLN
2.0000 mL | INTRAMUSCULAR | Status: AC | PRN
  Administered 2024-11-18: 2 mL

## 2024-11-18 MED ORDER — BETAMETHASONE SOD PHOS & ACET 6 (3-3) MG/ML IJ SUSP
6.0000 mg | INTRAMUSCULAR | Status: AC | PRN
  Administered 2024-11-18: 6 mg via INTRA_ARTICULAR

## 2024-11-18 NOTE — Progress Notes (Signed)
 Nathan [name] - 78 y.o. male MRN [id number]  Date of birth: Nov 17, 1946  Office Visit Note: Visit Date: 11/18/2024 PCP: Loreli Elsie [name], MD Referred by: Loreli Elsie [name], MD  Subjective: Chief Complaint  Patient presents with   Left Hip - Pain   HPI: Nathan [name] is a pleasant 78 y.o. male who presents today for evaluation of chronic left hip and leg pain.  He is a patient well-known to the practice, follows with Dr. Vernetta and does have a knee replacement scheduled in early January. Nathan [name] states he began having severe left hip pain the Wednesday before Thanksgiving with no known injury. He says that he had pain when he first put weight on his leg, and it did not improve. He did go to the [facility], and had an injection into the left glute and was prescribed oral Prednisone . These did give him some temporary relief, but his pain returned soon after finishing the Prednisone . He points moreso over the lateral hip but does have pain in posterior > anterior hip as well. He is having a left knee replacement, and is concerned about his ability with rehab due to his hip pain.  He is taking his tramadol  100 mg which he breaks another tablet in half taking 150 mg.  He does have a notable medical history of ATTR with cardiomyopathy, he was doing his own research and does note that bursitis is associated with this disease.  Pertinent ROS were reviewed with the patient and found to be negative unless otherwise specified above in HPI.   Assessment & Plan: Visit Diagnoses:  1. Trochanteric bursitis, left hip   2. Chronic left hip pain   3. Wild-type transthyretin-related (ATTR) amyloidosis (HCC)   4. Degeneration of intervertebral disc of lumbar region, unspecified whether pain present   5. Pain in left leg    Plan: Impression is chronic and progressive left hip pain, with most of his pain emanating from the lateral hip.  He is exquisitely tender over the greater trochanteric region  with a degree of bursitis present. He does have pain that is radiating down the lateral thigh and anterior shin as well but denies any specific numbness or tingling, this is likely more biomechanical pain from his gait change, but cannot rule out lumbar radiculopathy fully, especially in the setting of his advanced lumbar DDD and listhesis.  Given his medical history of ATTR, this does predispose to bursitis.  Through shared decision making, we did proceed with ultrasound-guided left greater trochanteric bursa injection for both diagnostic and therapeutic purposes.  Patient tolerated well.  May use ice/heat and Tylenol  for any postinjection pain.  He may continue the use of his tramadol  100-150 mg throughout the day as needed for breakthrough pain.  He has a specific physical therapist he has worked with in the past but he canceled these appointments given his pain and his upcoming TKA. Will get him started in 1-2 treatments here with transition to HEP for the hip/leg before his TKA.  He will make follow-up with Dr. Lorane Gaskins for this and to discuss preoperative discussion for his knee as he desires to surgery.  I am happy to see him back as needed.  For some reason this does not significantly help his pain, we will draw more attention to the lumbar spine (?MRI)  Follow-up: Return for f/u with [facility] clark as needed for hip/pre-op knee.   Meds & Orders: No orders of the defined types  were placed in this encounter.   Orders Placed This Encounter  Procedures   Large Joint Inj   US  Guided Needle Placement - No Linked Charges   Ambulatory referral to Physical Therapy     Procedures: Large Joint Inj: L greater trochanter on 11/18/2024 10:52 AM Indications: pain and diagnostic evaluation Details: 22 G 3.5 in needle, ultrasound-guided lateral approach Medications: 2 mL lidocaine  1 %; 2 mL bupivacaine  0.25 %; 6 mg betamethasone  acetate-betamethasone  sodium phosphate  6 (3-3) MG/ML Outcome:  tolerated well, no immediate complications  US -Guided Greater Trochanteric Bursa Injection, Left After discussion on risks/benefits/indications and informed verbal consent was obtained, a timeout was performed. The patient was lying in lateral recumbent position on exam table. Using ultrasound guidance, the greater trochanter was identified. The area overlying the trochanteric bursa was then prepped with Betadine and alcohol  swabs. Following sterile precautions, ultrasound was reapplied to visualize needle guidance with a 22-gauge 3.5 needle utilizing an in-plane approach to inject the bursa with 2:2:1 lidocaine :bupivicaine:betamethasone . Delivery of the injectate was visualized into the region of hypoechoic fluid of the greater trochanteric bursa. Patient tolerated procedure well without immediate complications.       Procedure, treatment alternatives, risks and benefits explained, specific risks discussed. Consent was given by the patient. Immediately prior to procedure a time out was called to verify the correct patient, procedure, equipment, support staff and site/side marked as required. Patient was prepped and draped in the usual sterile fashion.          Clinical History: No specialty comments available.  He reports that he quit smoking about 55 years ago. His smoking use included cigarettes. He has never used smokeless tobacco. No results for input(s): HGBA1C, LABURIC in the last 8760 hours.  Objective:    Physical Exam  Gen: Well-appearing, in no acute distress; non-toxic CV: Well-perfused. Warm.  Resp: Breathing unlabored on room air; no wheezing. Psych: Fluid speech in conversation; appropriate affect; normal thought process  Ortho Exam - Left hip/leg: Marked TTP with a degree of soft tissue swelling over the greater trochanteric bursa.  Positive FABER test, negative FADIR test.  There is weakness with resisted hip abduction bilaterally on the left is more prominent than  the right.  There is no gross restriction with internal or external logroll.  *Negative straight leg raise  Imaging:  *Independent review and interpretation of lumbar spine x-ray, 2 view AP and lateral from 08/26/2024 was performed by myself today.  There is significant degenerative change as well as a spondylolisthesis.  Hip joint is incompletely visualized but there is some calcification within the hip joint as well as mild osteoarthritic change.  *Did review x-ray report from [facility] from 10/31/2024.  Anatomical Region Laterality Modality  Hip -- Digital Radiography  Pelvis -- --   Impression  1.  No acute fracture. 2.  No malalignment. 3.  Mild bilateral chronic degenerative hip joint narrowing. Narrative  X-RAY HIP LEFT WITH OR WITHOUT PELVIS (2-3 VIEWS), 11/08/2024 9:35 AM  INDICATION: Pain in left hip \ M25.552 Pain in left hip COMPARISON: None. Procedure Note  Drena Deward Drivers, MD - 11/08/2024 Formatting of this note might be different from the original. X-RAY HIP LEFT WITH OR WITHOUT PELVIS (2-3 VIEWS), 11/08/2024 9:35 AM  INDICATION: Pain in left hip \ M25.552 Pain in left hip COMPARISON: None.  IMPRESSION: 1.  No acute fracture. 2.  No malalignment. 3.  Mild bilateral chronic degenerative hip joint narrowing. Exam End: 11/08/24 09:35   Specimen Collected: 11/08/24  10:14 Last Resulted: 11/08/24 10:14  Received From: [facility]  Result Received: 11/11/24 08:19    Past Medical/Family/Surgical/Social History: Medications & Allergies reviewed per EMR, new medications updated. Patient Active Problem List   Diagnosis Date Noted   Unilateral primary osteoarthritis, left knee 09/16/2024   Unilateral primary osteoarthritis, right knee 09/16/2024   Bone mass 11/13/2023   Nonrheumatic aortic valve stenosis 11/13/2023   Cardiac amyloidosis (HCC) 11/13/2023   History of adenomatous polyp of colon 01/07/2022   Pain in left hip 01/07/2022   Low back pain  08/27/2021   Ischial bursitis 05/19/2021   Lumbar radiculopathy 11/16/2020   Acquired trigger finger of left ring finger 10/19/2020   Bursitis of right shoulder 10/19/2020   Palpitations 06/08/2020   Trochanteric bursitis of left hip 03/27/2020   Aortic valve disorder 10/09/2019   Insomnia 12/11/2018   Osteoarthritis of carpometacarpal (CMC) joint of thumb 11/07/2018   Dry eye syndrome, bilateral 09/13/2018   Choroidal nevus, right 09/13/2018   Epiretinal membrane (ERM) of left eye 09/13/2018   Nuclear sclerosis, bilateral 09/13/2018   Hardening of the aorta (main artery of the heart) 05/10/2018   Impaired fasting glucose 05/10/2018   Irritable bowel syndrome with diarrhea 05/10/2018   Radial styloid tenosynovitis 12/19/2017   Right carpal tunnel syndrome 02/08/2017   Medication intolerance 09/02/2016   Anxiety 06/06/2016   Paresthesia and pain of right extremity 01/04/2016   CAD (coronary artery disease) of artery bypass graft 06/19/2014   Hyperlipidemia 06/19/2014   Essential hypertension 06/19/2014   Inguinal hernia, left 03/12/2014   Posterior vitreous detachment 08/23/2012   Retinal tear 08/23/2012   Age-related nuclear cataract 04/06/2012   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    Blood transfusion without reported diagnosis    Complication of anesthesia    jittery after surgery   Coronary artery disease    GERD (gastroesophageal reflux disease)    History of DVT (deep vein thrombosis)    Hyperlipemia    Hypertension    IFG (impaired fasting glucose)    Right carpal tunnel syndrome 02/08/2017   Seasonal allergies    Wears glasses    Family History  Problem Relation Age of Onset   Heart disease Mother    Congestive Heart Failure Mother    Cancer Father    Bladder Cancer Father    Alcohol  abuse Brother    Kidney failure Brother    Past Surgical History:  Procedure  Laterality Date   APPENDECTOMY     age 56   COLONOSCOPY     COLONOSCOPY WITH PROPOFOL  N/A 02/15/2016   Procedure: COLONOSCOPY WITH PROPOFOL ;  Surgeon: Gladis MARLA Louder, MD;  Location: [facility];  Service: Endoscopy;  Laterality: N/A;   CORONARY ARTERY BYPASS GRAFT     2003   ESOPHAGOGASTRODUODENOSCOPY (EGD) WITH PROPOFOL  N/A 02/15/2016   Procedure: ESOPHAGOGASTRODUODENOSCOPY (EGD) WITH PROPOFOL ;  Surgeon: Gladis MARLA Louder, MD;  Location: [facility];  Service: Endoscopy;  Laterality: N/A;   EXPLORATION POST OPERATIVE OPEN HEART     EYE SURGERY     detached retina-lt   INGUINAL HERNIA REPAIR Left 04/01/2014   Procedure: HERNIA REPAIR INGUINAL ADULT;  Surgeon: Alm VEAR Angle, MD;  Location: [facility];  Service: General;  Laterality: Left;   INSERTION OF MESH Left 04/01/2014   Procedure: INSERTION OF MESH;  Surgeon: Alm VEAR Angle, MD;  Location: [facility];  Service: General;  Laterality: Left;   KNEE ARTHROCENTESIS  1980   right   SHOULDER ACROMIOPLASTY  1985   rt   SINUS ENDO W/FUSION     TONSILLECTOMY AND ADENOIDECTOMY     Social History   Occupational History   Occupation: [employer]  Tobacco Use   Smoking status: Former    Current packs/day: 0.00    Types: Cigarettes    Quit date: 03/26/1969    Years since quitting: 55.6   Smokeless tobacco: Never  Vaping Use   Vaping status: Never Used  Substance and Sexual Activity   Alcohol  use: Yes    Alcohol /week: 0.0 standard drinks of alcohol     Comment: 2 per day   Drug use: No   Sexual activity: Not Currently

## 2024-11-18 NOTE — Progress Notes (Signed)
 Patient says that he began having severe left hip pain the Wednesday before Thanksgiving with no known injury. He says that he had pain when he first put weight on his leg, and it did not improve. He did go to the [facility], and had an injection into the left glute and was prescribed oral Prednisone . These did give him some temporary relief, but his pain returned soon after finishing the Prednisone . He points over the anterior hip and groin, lateral hip and IT band, and posterior hip when describing his pain. He is having a left knee replacement, and is concerned about his ability with rehab due to his hip pain.

## 2024-11-25 ENCOUNTER — Other Ambulatory Visit: Payer: Self-pay | Admitting: Orthopaedic Surgery

## 2024-11-25 ENCOUNTER — Encounter: Payer: Self-pay | Admitting: Sports Medicine

## 2024-11-25 MED ORDER — HYDROCODONE-ACETAMINOPHEN 5-325 MG PO TABS: 1.0000 | ORAL_TABLET | Freq: Three times a day (TID) | ORAL | 0 refills | Status: DC | PRN

## 2024-11-25 MED ORDER — TIZANIDINE HCL 4 MG PO TABS: 4.0000 mg | ORAL_TABLET | Freq: Four times a day (QID) | ORAL | 1 refills | Status: AC | PRN

## 2024-11-26 DIAGNOSIS — G5603 Carpal tunnel syndrome, bilateral upper limbs: Secondary | ICD-10-CM | POA: Diagnosis not present

## 2024-11-26 DIAGNOSIS — E8589 Other amyloidosis: Secondary | ICD-10-CM | POA: Diagnosis not present

## 2024-11-26 DIAGNOSIS — M4807 Spinal stenosis, lumbosacral region: Secondary | ICD-10-CM | POA: Diagnosis not present

## 2024-11-28 ENCOUNTER — Other Ambulatory Visit (HOSPITAL_BASED_OUTPATIENT_CLINIC_OR_DEPARTMENT_OTHER): Payer: Self-pay

## 2024-11-28 ENCOUNTER — Telehealth: Payer: Self-pay | Admitting: Orthopaedic Surgery

## 2024-11-28 ENCOUNTER — Ambulatory Visit (HOSPITAL_BASED_OUTPATIENT_CLINIC_OR_DEPARTMENT_OTHER): Admitting: Student

## 2024-11-28 DIAGNOSIS — E8582 Wild-type transthyretin-related (ATTR) amyloidosis: Secondary | ICD-10-CM

## 2024-11-28 DIAGNOSIS — M5442 Lumbago with sciatica, left side: Secondary | ICD-10-CM | POA: Diagnosis not present

## 2024-11-28 DIAGNOSIS — M79605 Pain in left leg: Secondary | ICD-10-CM

## 2024-11-28 DIAGNOSIS — G8929 Other chronic pain: Secondary | ICD-10-CM

## 2024-11-28 DIAGNOSIS — M25552 Pain in left hip: Secondary | ICD-10-CM

## 2024-11-28 MED ORDER — OXYCODONE HCL 5 MG PO TABS
5.0000 mg | ORAL_TABLET | ORAL | 0 refills | Status: DC | PRN
  Filled 2024-11-28: qty 20, 5d supply, fill #0

## 2024-11-28 NOTE — Therapy (Incomplete)
 OUTPATIENT PHYSICAL THERAPY LOWER EXTREMITY EVALUATION   Patient Name: Nathan [name] MRN: [id number] DOB:02/08/1946, 78 y.o., male Today's Date: 11/28/2024  END OF SESSION:   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    Blood transfusion without reported diagnosis    Complication of anesthesia    jittery after surgery   Coronary artery disease    GERD (gastroesophageal reflux disease)    History of DVT (deep vein thrombosis)    Hyperlipemia    Hypertension    IFG (impaired fasting glucose)    Right carpal tunnel syndrome 02/08/2017   Seasonal allergies    Wears glasses    Past Surgical History:  Procedure Laterality Date   APPENDECTOMY     age 49   COLONOSCOPY     COLONOSCOPY WITH PROPOFOL  N/A 02/15/2016   Procedure: COLONOSCOPY WITH PROPOFOL ;  Surgeon: Gladis MARLA Louder, MD;  Location: [facility];  Service: Endoscopy;  Laterality: N/A;   CORONARY ARTERY BYPASS GRAFT     2003   ESOPHAGOGASTRODUODENOSCOPY (EGD) WITH PROPOFOL  N/A 02/15/2016   Procedure: ESOPHAGOGASTRODUODENOSCOPY (EGD) WITH PROPOFOL ;  Surgeon: Gladis MARLA Louder, MD;  Location: [facility];  Service: Endoscopy;  Laterality: N/A;   EXPLORATION POST OPERATIVE OPEN HEART     EYE SURGERY     detached retina-lt   INGUINAL HERNIA REPAIR Left 04/01/2014   Procedure: HERNIA REPAIR INGUINAL ADULT;  Surgeon: Alm VEAR Angle, MD;  Location: [facility];  Service: General;  Laterality: Left;   INSERTION OF MESH Left 04/01/2014   Procedure: INSERTION OF MESH;  Surgeon: Alm VEAR Angle, MD;  Location: [facility];  Service: General;  Laterality: Left;   KNEE ARTHROCENTESIS  1980   right   SHOULDER ACROMIOPLASTY  1985   rt   SINUS ENDO W/FUSION     TONSILLECTOMY AND ADENOIDECTOMY     Patient Active Problem List   Diagnosis Date Noted   Unilateral primary osteoarthritis, left knee 09/16/2024   Unilateral  primary osteoarthritis, right knee 09/16/2024   Bone mass 11/13/2023   Nonrheumatic aortic valve stenosis 11/13/2023   Cardiac amyloidosis (HCC) 11/13/2023   History of adenomatous polyp of colon 01/07/2022   Pain in left hip 01/07/2022   Low back pain 08/27/2021   Ischial bursitis 05/19/2021   Lumbar radiculopathy 11/16/2020   Acquired trigger finger of left ring finger 10/19/2020   Bursitis of right shoulder 10/19/2020   Palpitations 06/08/2020   Trochanteric bursitis of left hip 03/27/2020   Aortic valve disorder 10/09/2019   Insomnia 12/11/2018   Osteoarthritis of carpometacarpal (CMC) joint of thumb 11/07/2018   Dry eye syndrome, bilateral 09/13/2018   Choroidal nevus, right 09/13/2018   Epiretinal membrane (ERM) of left eye 09/13/2018   Nuclear sclerosis, bilateral 09/13/2018   Hardening of the aorta (main artery of the heart) 05/10/2018   Impaired fasting glucose 05/10/2018   Irritable bowel syndrome with diarrhea 05/10/2018   Radial styloid tenosynovitis 12/19/2017   Right carpal tunnel syndrome 02/08/2017   Medication intolerance 09/02/2016   Anxiety 06/06/2016   Paresthesia and pain of right extremity 01/04/2016   CAD (coronary artery disease) of artery bypass graft 06/19/2014   Hyperlipidemia 06/19/2014   Essential hypertension 06/19/2014   Inguinal hernia, left 03/12/2014   Posterior vitreous detachment 08/23/2012   Retinal tear 08/23/2012   Age-related nuclear cataract 04/06/2012    PCP: Elsie Loreli Raddle, MD  REFERRING PROVIDER: Lonell Sprang, DO  REFERRING DIAG: M25.552,G89.29 (ICD-10-CM) - Chronic left hip pain M70.62 (ICD-10-CM) - Trochanteric bursitis, left hip M79.605 (ICD-10-CM) - Pain in left leg  THERAPY DIAG:  No diagnosis found.  Rationale for Evaluation and Treatment: Rehabilitation  ONSET DATE: ***  SUBJECTIVE:   SUBJECTIVE STATEMENT: ***  PERTINENT HISTORY: *** PAIN:  Are you having pain? Yes: NPRS scale: *** Pain location:  *** Pain description: *** Aggravating factors: *** Relieving factors: ***  PRECAUTIONS: {Therapy precautions:24002}  RED FLAGS: {PT Red Flags:29287}   WEIGHT BEARING RESTRICTIONS: No  FALLS:  Has patient fallen in last 6 months? {fallsyesno:27318}  LIVING ENVIRONMENT: Lives with: {[facility] lives with:25569::lives with their family} Lives in: {Lives in:25570} Stairs: {opstairs:27293} Has following equipment at home: {Assistive devices:23999}  OCCUPATION: ***  PLOF: {PLOF:24004}  PATIENT GOALS: ***  NEXT MD VISIT: ***  OBJECTIVE:  Note: Objective measures were completed at Evaluation unless otherwise noted.  DIAGNOSTIC FINDINGS: ***  PATIENT SURVEYS:  PSFS: THE PATIENT SPECIFIC FUNCTIONAL SCALE  Place score of 0-10 (0 = unable to perform activity and 10 = able to perform activity at the same level as before injury or problem)  Activity Date: 11/29/2024         2.     3.     4.      Total Score ***      Total Score = Sum of activity scores/number of activities  Minimally Detectable Change: 3 points (for single activity); 2 points (for average score)  [facility] (nd). The Patient Specific Functional Scale . Retrieved from [email or web address].pt   COGNITION: Overall cognitive status: {cognition:24006}     SENSATION: {sensation:27233}  EDEMA:  {edema:24020}  MUSCLE LENGTH: Hamstrings: Right *** deg; Left *** deg Debby test: Right *** deg; Left *** deg  POSTURE: {posture:25561}  PALPATION: ***  LOWER EXTREMITY ROM:  {AROM/PROM:27142} ROM Right Eval 11/29/2024 Left Eval 11/29/2024  Hip flexion    Hip extension    Hip abduction    Hip adduction    Hip internal rotation    Hip external rotation    Knee flexion    Knee extension    Ankle dorsiflexion    Ankle plantarflexion    Ankle inversion    Ankle eversion     (Blank rows = not tested)  LOWER EXTREMITY MMT:  MMT  Right Eval 11/29/2024 Left Eval 11/29/2024  Hip flexion    Hip extension    Hip abduction    Hip adduction    Hip internal rotation    Hip external rotation    Knee flexion    Knee extension    Ankle dorsiflexion    Ankle plantarflexion    Ankle inversion    Ankle eversion     (Blank rows = not tested)  LOWER EXTREMITY SPECIAL TESTS:  {LEspecialtests:26242}  FUNCTIONAL TESTS:  {Functional tests:24029}  GAIT: Distance walked: *** Assistive device utilized: {Assistive devices:23999} Level of assistance: {Levels of assistance:24026} Comments: ***  TREATMENT DATE:  11/29/2024 TherEx:  HEP handout provided with patient performing one set of each activity for appropriate form. Verbal and tactile cues provided.  Self-Care:  POC, ***    PATIENT EDUCATION:  Education details: *** Person educated: {Person educated:25204} Education method: {Education Method:25205} Education comprehension: {Education Comprehension:25206}  HOME EXERCISE PROGRAM: ***  ASSESSMENT:  CLINICAL IMPRESSION: Patient is a 78 y.o. M who was seen today for physical therapy evaluation and treatment for Lt hip and LE pain presenting with ***. Patient will benefit from skilled PT to above noted deficits.   OBJECTIVE IMPAIRMENTS: {opptimpairments:25111}.   ACTIVITY LIMITATIONS: {activitylimitations:27494}  PARTICIPATION LIMITATIONS: {participationrestrictions:25113}  PERSONAL FACTORS: {Personal factors:25162} are also affecting patient's functional outcome.   REHAB POTENTIAL: {rehabpotential:25112}  CLINICAL DECISION MAKING: {clinical decision making:25114}  EVALUATION COMPLEXITY: {Evaluation complexity:25115}   GOALS: Goals reviewed with patient? Yes  SHORT TERM GOALS: Target date: *** Patient will show compliance with initial HEP. Baseline: Goal status:  INITIAL  2.  Patient will report pain levels no greater than ***/10 in order to show an improved overall quality of life. Baseline:  Goal status: INITIAL  3.  *** Baseline:  Goal status: INITIAL  4.  *** Baseline:  Goal status: INITIAL  5.  *** Baseline:  Goal status: INITIAL  6.  *** Baseline:  Goal status: INITIAL  LONG TERM GOALS: Target date: ***  Patient will be independent with final HEP in order to maintain and progress upon functional gains made within PT. Baseline:  Goal status: INITIAL  2.  Patient will report pain levels no greater than ***/10 in order to show an improved overall quality of life. Baseline:  Goal status: INITIAL  3.  Patient will increase PSFS to at least *** in order to show a significant improvement in subjective disability. Baseline:  Goal status: INITIAL  4.  *** Baseline:  Goal status: INITIAL  5.  *** Baseline:  Goal status: INITIAL  6.  *** Baseline:  Goal status: INITIAL   PLAN:  PT FREQUENCY: {rehab frequency:25116}  PT DURATION: {rehab duration:25117}  PLANNED INTERVENTIONS: 97164- PT Re-evaluation, 97750- Physical Performance Testing, 97110-Therapeutic exercises, 97530- Therapeutic activity, [id number]- Neuromuscular re-education, 97535- Self Care, 02859- Manual therapy, [id number]- Gait training, [id number]- Orthotic Initial, [id number]- Orthotic/Prosthetic subsequent, [id number]- Canalith repositioning, [id number]- Aquatic Therapy, H9716- Electrical stimulation (unattended), [phone]- Electrical stimulation (manual), [id number]- Vasopneumatic device, [id number]- Ultrasound, [id number]- Traction (mechanical), [id number]- Ionotophoresis 4mg /ml Dexamethasone , 79439 (1-2 muscles), 20561 (3+ muscles)- Dry Needling, Patient/Family education, Balance training, Stair training, Taping, Joint mobilization, Joint manipulation, Spinal manipulation, Spinal mobilization, Vestibular training, DME instructions, Cryotherapy, and Moist heat  PLAN FOR NEXT SESSION: ***   Susannah Daring, PT,  DPT 11/28/2024 9:14 AM

## 2024-11-28 NOTE — Progress Notes (Signed)
 Chief Complaint: Left leg pain    Discussed the use of AI scribe software for clinical note transcription with the patient, who gave verbal consent to proceed.  History of Present Illness Nathan [name] is an 78 year old male with hip bursitis and degenerative disc disease who presents with severe leg and groin pain.  He is a well-known patient of Dr. Vernetta.  He reports sudden worsening of severe leg and groin pain after a hip injection on 12/8 for bursitis that relieved symptoms for only 48 hours, with marked worsening by Monday. The pain is sharp with standing and aching in the groin and buttock, radiating down the leg to the shin and foot. Pain is mainly in the leg when walking and in the lower back when sitting. He has no numbness, tingling, or bowel or bladder incontinence. He has constipation likely from pain medications. 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 He takes Xarelto  for clot prevention. He has tried pain medication and a muscle relaxer prescribed this week, as well as tramadol  and hydrocodone , without significant relief. He is scheduled to undergo total knee arthroplasty with Dr. Vernetta in January.   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    Blood transfusion without reported diagnosis    Complication of anesthesia    jittery after surgery   Coronary artery disease    GERD (gastroesophageal reflux disease)    History of DVT (deep vein thrombosis)    Hyperlipemia    Hypertension    IFG (impaired fasting glucose)    Right carpal tunnel syndrome 02/08/2017   Seasonal allergies    Wears glasses    Past Surgical History:  Procedure Laterality Date   APPENDECTOMY     age  18   COLONOSCOPY     COLONOSCOPY WITH PROPOFOL  N/A 02/15/2016   Procedure: COLONOSCOPY WITH PROPOFOL ;  Surgeon: Gladis MARLA Louder, MD;  Location: [facility];  Service: Endoscopy;  Laterality: N/A;   CORONARY ARTERY BYPASS GRAFT     2003   ESOPHAGOGASTRODUODENOSCOPY (EGD) WITH PROPOFOL  N/A 02/15/2016   Procedure: ESOPHAGOGASTRODUODENOSCOPY (EGD) WITH PROPOFOL ;  Surgeon: Gladis MARLA Louder, MD;  Location: [facility];  Service: Endoscopy;  Laterality: N/A;   EXPLORATION POST OPERATIVE OPEN HEART     EYE SURGERY     detached retina-lt   INGUINAL HERNIA REPAIR Left 04/01/2014   Procedure: HERNIA REPAIR INGUINAL ADULT;  Surgeon: Alm VEAR Angle, MD;  Location: [facility];  Service: General;  Laterality: Left;   INSERTION OF MESH Left 04/01/2014   Procedure: INSERTION OF MESH;  Surgeon: Alm VEAR Angle, MD;  Location: [facility];  Service: General;  Laterality: Left;   KNEE ARTHROCENTESIS  1980   right   SHOULDER ACROMIOPLASTY  1985   rt   SINUS ENDO W/FUSION     TONSILLECTOMY AND ADENOIDECTOMY     Social History   Socioeconomic History   Marital status: Widowed    Spouse name: Not on file   Number of children: Not on file   Years of education: post grad  Highest education level: Not on file  Occupational History   Occupation: [employer]  Tobacco Use   Smoking status: Former    Current packs/day: 0.00    Types: Cigarettes    Quit date: 03/26/1969    Years since quitting: 55.7   Smokeless tobacco: Never  Vaping Use   Vaping status: Never Used  Substance and Sexual Activity   Alcohol  use: Yes    Alcohol /week: 0.0 standard drinks of alcohol     Comment: 2 per day   Drug use: No   Sexual activity: Not Currently  Other Topics Concern   Not on file  Social History Narrative   Patient drinks 1-2 cups of caffeine daily.   Patient is left handed.       Are you right handed or left handed? Left Handed   Are you currently employed ? 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 Yes   Who lives with you?    What type of home do you live in: 1 story or 2 story? Lives in a two story home       Social Drivers of Health   Tobacco Use: Medium Risk (11/26/2024)   Received from [facility]   Patient History    Smoking Tobacco Use: Former    Smokeless Tobacco Use: Unknown    Passive Exposure: Not on file  Financial Resource Strain: Not on file  Food Insecurity: Not on file  Transportation Needs: Not on file  Physical Activity: Not on file  Stress: Not on file  Social Connections: Not on file  Depression (EYV7-0): Not on file  Alcohol  Screen: Not on file  Housing: Unknown (08/10/2024)   Received from [facility]   Epic    Unable to Pay for Housing in the Last Year: Not on file    Number of Times Moved in the Last Year: Not on file    At any time in the past 12 months, were you homeless or living in a shelter (including now)?: No  Utilities: Not on file  Health Literacy: Not on file   Family History  Problem Relation Age of Onset   Heart disease Mother    Congestive Heart Failure Mother    Cancer Father    Bladder Cancer Father    Alcohol  abuse Brother    Kidney failure Brother    Allergies[1] Current Outpatient Medications  Medication Sig Dispense Refill   HYDROcodone -acetaminophen  (NORCO/VICODIN) 5-325 MG tablet Take 1-2 tablets by mouth 3 (three) times daily as needed for moderate pain (pain score 4-6). 30 tablet 0   oxyCODONE  (ROXICODONE ) 5 MG immediate release tablet Take 1 tablet (5 mg total) by mouth every 4 (four) hours as needed for severe pain (pain score 7-10) or breakthrough pain. 20 tablet 0   acetaminophen  (TYLENOL ) 500 MG tablet Take 1,000 mg by mouth at bedtime.     augmented betamethasone  dipropionate (DIPROLENE -AF) 0.05 % ointment Apply 1 Application topically 2 (two) times daily as needed (skin irritation (hands)).     cetirizine (ZYRTEC)  10 MG tablet Take 10 mg by mouth in the morning.     cycloSPORINE (RESTASIS) 0.05 % ophthalmic emulsion Place 1 drop into both eyes 2 (two) times daily.      empagliflozin  (JARDIANCE ) 10 MG TABS tablet Take 1 tablet (10 mg total) by mouth daily before breakfast. 90 tablet 3   Evolocumab  (REPATHA  SURECLICK) 140 MG/ML  SOAJ INJECT 140 MG INTO THE SKIN EVERY 14 (FOURTEEN) DAYS. 6 mL 3   flurazepam (DALMANE ) 30 MG capsule Take 1 capsule (30 mg total) by mouth at bedtime. 30 capsule 5   furosemide  (LASIX ) 20 MG tablet Take 1 tablet (20 mg total) by mouth daily as needed. 30 tablet 3   gabapentin (NEURONTIN) 300 MG capsule Take 300 mg by mouth at bedtime.     GENTEAL 0.25-0.3 % GEL Place 1 drop into both eyes at bedtime.     Hypromellose (PURE & GENTLE LUBRICANT) 0.3 % SOLN Apply to eye daily at 6 (six) AM.     LOMOTIL 2.5-0.025 MG tablet as needed for diarrhea or loose stools.     olmesartan (BENICAR) 20 MG tablet Take 10 mg by mouth daily. Take one half (0.5) tablet by mouth (10 mg) daily.     predniSONE  (DELTASONE ) 20 MG tablet Take 2 tablets (40 mg total) by mouth daily. 10 tablet 0   Probiotic Product (ALIGN) 10 MG CAPS daily at 6 (six) AM.     rivaroxaban  (XARELTO ) 10 MG TABS tablet Take 1 tablet (10 mg total) by mouth daily. 90 tablet 3   SYSTANE 0.4-0.3 % SOLN Place 1 drop into both eyes at bedtime.     Tafamidis  61 MG CAPS Take 1 capsule (61 mg total) by mouth daily. 90 capsule 1   tiZANidine  (ZANAFLEX ) 4 MG tablet Take 1 tablet (4 mg total) by mouth every 6 (six) hours as needed for muscle spasms. 30 tablet 1   traMADol  (ULTRAM ) 50 MG tablet as needed (sleep).     triamcinolone  (NASACORT ) 55 MCG/ACT AERO nasal inhaler Place 2 sprays into the nose at bedtime.     Vitamin A 1500 MCG/ML LIQD Take 1,500 mcg by mouth daily.     vutrisiran  sodium (AMVUTTRA ) 25 MG/0.5ML syringe Inject into the skin every 3 (three) months.     Current Facility-Administered Medications  Medication Dose Route  Frequency Provider Last Rate Last Admin   vutrisiran  sodium (AMVUTTRA ) 25 MG/0.5ML syringe 25 mg  25 mg Subcutaneous Q90 days Chandrasekhar, Mahesh A, MD       No results found.  Review of Systems:   A ROS was performed including pertinent positives and negatives as documented in the HPI.  Physical Exam :   Constitutional: NAD and appears stated age Neurological: Alert and oriented Psych: Appropriate affect and cooperative There were no vitals taken for this visit.   Comprehensive Musculoskeletal Exam:    Patient appears generally uncomfortable on the exam table.  Tenderness with palpation over the upper and mid lumbar midline extending into the left lumbar paraspinal musculature.  Palpation in this area does reciprocate symptoms in the left buttock.  There is also tenderness over the greater trochanter of the left hip.  Passive left hip range of motion is fluid and full with minimal discomfort.  Left knee flexion and extension strength is 4/5 compared to 5/5 on contralateral side.  Posterior hip discomfort with FABER.  Negative left straight leg raise.  Full knee bilateral ankle dorsiflexion and plantarflexion strength.  Imaging:        Assessment & Plan Left leg pain Patient is experiencing severe left leg pain, notably in the groin, lateral, and posterior hip with likely radiation into the thigh, shin, and anterior ankle.  Mild weakness is noted on exam at the knee although this could also be attributed to his underlying advanced osteoarthritis.  Symptoms have been worsening since trochanteric bursa injection about  10 days ago which only provided very short-term relief.  At this point I believe symptoms appear most consistent with lumbar radiculopathy and has been resistant to treatments including numerous medications and therapeutic measures.  Would like to proceed with a stat MRI of the lumbar spine to assess for presence of nerve compression with comparison to last MRI in 2023.  Consider referral to the spine team for an epidural steroid injection based on MRI results. Prescribed short course of oxycodone  for pain management, not to take with hydrocodone  or tramadol . Advised follow-up in the emergency department if pain becomes unmanageable or red flag symptoms develop for further evaluation and management.    I personally spent a total of 45 minutes in the care of the patient today including getting/reviewing separately obtained history, performing a medically appropriate exam/evaluation, counseling and educating, placing orders, referring and communicating with other health care professionals, and documenting clinical information in the EHR.    I personally saw and evaluated the patient, and participated in the management and treatment plan.  Leonce Reveal, PA-C Orthopedics     [1]  Allergies Allergen Reactions   Bystolic [Nebivolol Hcl] Rash   Lexiscan  [Regadenoson ] Other (See Comments)    Sharp sternal pain   Triamcinolone  Acetonide Acetate [Triamcinolone ] Hives   Zetia  [Ezetimibe ] Diarrhea    GI effects - bloating, cramping, diarrhea on 5mg  and 10mg  dose   Amlodipine  Other (See Comments)    Unknown reaction   Hydromorphone      Other Reaction(s): syncope after taking   Eliquis [Apixaban] Hives, Itching and Rash   Lipitor [Atorvastatin ] Rash   Lisinopril  Nausea Only    GI upset   Neosporin [Neomycin-Bacitracin Zn-Polymyx] Rash   Pravastatin Rash   Toprol Xl [Metoprolol Tartrate] Rash

## 2024-11-28 NOTE — Telephone Encounter (Signed)
 Pt called stating that the medication is not helping his pain. Please call to about this matter at [phone].

## 2024-11-29 ENCOUNTER — Emergency Department (HOSPITAL_BASED_OUTPATIENT_CLINIC_OR_DEPARTMENT_OTHER)
Admission: EM | Admit: 2024-11-29 | Discharge: 2024-11-29 | Disposition: A | Discharge status: Home / Self Care | Attending: Emergency Medicine | Admitting: Emergency Medicine

## 2024-11-29 ENCOUNTER — Emergency Department (HOSPITAL_BASED_OUTPATIENT_CLINIC_OR_DEPARTMENT_OTHER)

## 2024-11-29 ENCOUNTER — Emergency Department (HOSPITAL_BASED_OUTPATIENT_CLINIC_OR_DEPARTMENT_OTHER): Admitting: Radiology

## 2024-11-29 ENCOUNTER — Telehealth (HOSPITAL_BASED_OUTPATIENT_CLINIC_OR_DEPARTMENT_OTHER): Payer: Self-pay | Admitting: Student

## 2024-11-29 ENCOUNTER — Ambulatory Visit

## 2024-11-29 ENCOUNTER — Other Ambulatory Visit: Payer: Self-pay

## 2024-11-29 ENCOUNTER — Other Ambulatory Visit (HOSPITAL_BASED_OUTPATIENT_CLINIC_OR_DEPARTMENT_OTHER): Payer: Self-pay

## 2024-11-29 ENCOUNTER — Encounter (HOSPITAL_COMMUNITY): Payer: Self-pay | Admitting: Internal Medicine

## 2024-11-29 DIAGNOSIS — M5416 Radiculopathy, lumbar region: Secondary | ICD-10-CM

## 2024-11-29 DIAGNOSIS — Z79899 Other long term (current) drug therapy: Secondary | ICD-10-CM | POA: Diagnosis not present

## 2024-11-29 DIAGNOSIS — I2510 Atherosclerotic heart disease of native coronary artery without angina pectoris: Secondary | ICD-10-CM | POA: Insufficient documentation

## 2024-11-29 DIAGNOSIS — I10 Essential (primary) hypertension: Secondary | ICD-10-CM | POA: Diagnosis not present

## 2024-11-29 DIAGNOSIS — M79662 Pain in left lower leg: Secondary | ICD-10-CM | POA: Diagnosis not present

## 2024-11-29 DIAGNOSIS — M25552 Pain in left hip: Secondary | ICD-10-CM | POA: Insufficient documentation

## 2024-11-29 LAB — BASIC METABOLIC PANEL WITH GFR
Anion gap: 12 (ref 5–15)
BUN: 16 mg/dL (ref 8–23)
CO2: 26 mmol/L (ref 22–32)
Calcium: 9.7 mg/dL (ref 8.9–10.3)
Chloride: 102 mmol/L (ref 98–111)
Creatinine, Ser: 0.88 mg/dL (ref 0.61–1.24)
GFR, Estimated: >60 mL/min
Glucose, Bld: 101 mg/dL — ABNORMAL HIGH (ref 70–99)
Potassium: 3.9 mmol/L (ref 3.5–5.1)
Sodium: 139 mmol/L (ref 135–145)

## 2024-11-29 LAB — CBC
HCT: 40.5 % (ref 39.0–52.0)
Hemoglobin: 14.1 g/dL (ref 13.0–17.0)
MCH: 32.6 pg (ref 26.0–34.0)
MCHC: 34.8 g/dL (ref 30.0–36.0)
MCV: 93.8 fL (ref 80.0–100.0)
Platelets: 169 K/uL (ref 150–400)
RBC: 4.32 MIL/uL (ref 4.22–5.81)
RDW: 11.9 % (ref 11.5–15.5)
WBC: 4.6 K/uL (ref 4.0–10.5)
nRBC: 0 % (ref 0.0–0.2)

## 2024-11-29 LAB — CK: Total CK: 42 U/L — ABNORMAL LOW (ref 49–397)

## 2024-11-29 LAB — TROPONIN T, HIGH SENSITIVITY
Troponin T High Sensitivity: 16 ng/L (ref 0–19)
Troponin T High Sensitivity: 16 ng/L (ref 0–19)

## 2024-11-29 MED ORDER — OXYCODONE HCL 5 MG PO TABS: 5.0000 mg | ORAL_TABLET | ORAL | 0 refills | Status: DC | PRN

## 2024-11-29 MED ORDER — PREDNISONE 20 MG PO TABS
40.0000 mg | ORAL_TABLET | Freq: Every day | ORAL | 0 refills | Status: AC
  Filled 2024-11-29: qty 10, 5d supply, fill #0

## 2024-11-29 MED ORDER — PREDNISONE 20 MG PO TABS: 40.0000 mg | ORAL_TABLET | Freq: Every day | ORAL | 0 refills | Status: DC

## 2024-11-29 MED ORDER — DEXAMETHASONE SOD PHOSPHATE PF 10 MG/ML IJ SOLN
10.0000 mg | Freq: Once | INTRAMUSCULAR | Status: AC
  Administered 2024-11-29: 10 mg via INTRAVENOUS

## 2024-11-29 MED ORDER — FENTANYL CITRATE (PF) 50 MCG/ML IJ SOSY
100.0000 ug | PREFILLED_SYRINGE | Freq: Once | INTRAMUSCULAR | Status: AC
  Administered 2024-11-29: 100 ug via INTRAVENOUS
  Filled 2024-11-29: qty 2

## 2024-11-29 MED ORDER — OXYCODONE HCL 5 MG PO TABS
5.0000 mg | ORAL_TABLET | ORAL | 0 refills | Status: AC | PRN
  Filled 2024-11-29 (×2): qty 20, 2d supply, fill #0

## 2024-11-29 NOTE — ED Triage Notes (Signed)
 Reports SHOB and pain in L flank, L hip, and L lower leg pain. Hx of DVT in same leg. States has hx of ATTR-CM

## 2024-11-29 NOTE — ED Provider Notes (Incomplete)
 " [place] EMERGENCY DEPARTMENT AT [facility] Provider Note   CSN: [id number] Arrival date & time: 11/29/24  9157     Patient presents with: Shortness of Breath   Nathan [name] is a 78 y.o. male.  {Add pertinent medical, surgical, social history, OB history to YEP:67052}  Shortness of Breath Patient presents with pain in left hip and down the left leg.  Has had for a while now.  Has seen orthopedic surgery and neurology for it.  Most recently per Dr. Erika yesterday thought to be lumbar radiculopathy and stat MRI had been ordered.  Pain worse with movement.  Worse with positions.  Worse in the mornings.  May have a little bit of swelling on the leg.  Has had previous DVTs in the leg.  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  Patient is still on anticoagulation.  Initially had said some shortness of breath but states that he thinks the pain just got a little too low but does not feel short of breath overall.    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 Authorizing Provider  HYDROcodone -acetaminophen  (NORCO/VICODIN) 5-325 MG tablet Take 1-2 tablets by mouth 3 (three) times daily as needed for moderate pain (pain score 4-6). 11/25/24   Vernetta Lonni GRADE, MD  acetaminophen  (TYLENOL ) 500 MG tablet Take 1,000 mg by mouth at bedtime.    [provider]  augmented betamethasone  dipropionate (DIPROLENE -AF) 0.05 % ointment Apply  1 Application topically 2 (two) times daily as needed (skin irritation (hands)). 11/17/23   [provider]  cetirizine (ZYRTEC) 10 MG tablet Take 10 mg by mouth in the morning.    [provider]  cycloSPORINE (RESTASIS) 0.05 % ophthalmic emulsion Place 1 drop into both eyes 2 (two) times daily.     [provider]  empagliflozin  (JARDIANCE ) 10 MG TABS tablet Take 1 tablet (10 mg total) by mouth daily before breakfast. 08/27/24   Chandrasekhar, Mahesh A, MD  Evolocumab  (REPATHA  SURECLICK) 140 MG/ML SOAJ INJECT 140 MG INTO THE SKIN EVERY 14 (FOURTEEN) DAYS. 09/24/24   Chandrasekhar, Stanly A, MD  flurazepam (DALMANE ) 30 MG capsule Take 1 capsule (30 mg total) by mouth at bedtime. 08/19/24     furosemide  (LASIX ) 20 MG tablet Take 1 tablet (20 mg total) by mouth daily as needed. 10/17/24   Chandrasekhar, Stanly A, MD  gabapentin (NEURONTIN) 300 MG capsule Take 300 mg by mouth at bedtime.    [provider]  GENTEAL 0.25-0.3 % GEL Place 1 drop into both eyes at bedtime.    [provider]  Hypromellose (PURE & GENTLE LUBRICANT) 0.3 % SOLN Apply to eye daily at 6 (six) AM. 10/26/22   [provider]  LOMOTIL 2.5-0.025 MG tablet as needed for diarrhea or loose stools. 07/05/24   [provider]  olmesartan (BENICAR) 20 MG tablet Take 10 mg by mouth daily.  Take one half (0.5) tablet by mouth (10 mg) daily. 09/06/22   [provider]  oxyCODONE  (ROXICODONE ) 5 MG immediate release tablet Take 1 tablet (5 mg total) by mouth every 4 (four) hours as needed for severe pain (pain score 7-10) or breakthrough pain. 11/28/24   Overturf, Jackson L, PA-C  predniSONE  (DELTASONE ) 20 MG tablet Take 2 tablets (40 mg total) by mouth daily. 11/08/24     Probiotic Product (ALIGN) 10 MG CAPS daily at 6 (six) AM.    [provider]  rivaroxaban  (XARELTO ) 10 MG TABS tablet Take 1 tablet (10 mg total) by mouth daily. 04/26/24   Thayil, Irene T, PA-C   SYSTANE 0.4-0.3 % SOLN Place 1 drop into both eyes at bedtime.    [provider]  Tafamidis  61 MG CAPS Take 1 capsule (61 mg total) by mouth daily. 10/18/24   Chandrasekhar, Stanly LABOR, MD  tiZANidine  (ZANAFLEX ) 4 MG tablet Take 1 tablet (4 mg total) by mouth every 6 (six) hours as needed for muscle spasms. 11/25/24   Vernetta Lonni GRADE, MD  traMADol  (ULTRAM ) 50 MG tablet as needed (sleep).    [provider]  triamcinolone  (NASACORT ) 55 MCG/ACT AERO nasal inhaler Place 2 sprays into the nose at bedtime.    [provider]  Vitamin A 1500 MCG/ML LIQD Take 1,500 mcg by mouth daily.    [provider]  vutrisiran  sodium (AMVUTTRA ) 25 MG/0.5ML syringe Inject into the skin every 3 (three) months. 05/01/24   [provider]    Allergies: Bystolic [nebivolol hcl], Lexiscan  [regadenoson ], Triamcinolone  acetonide acetate [triamcinolone ], Zetia  [ezetimibe ], Amlodipine , Hydromorphone , Eliquis [apixaban], Lipitor [atorvastatin ], Lisinopril , Neosporin [neomycin-bacitracin zn-polymyx], Pravastatin, and Toprol xl [metoprolol tartrate]    Review of Systems  Respiratory:  Positive for shortness of breath.     Updated Vital Signs BP (!) 142/70   Pulse 93   Temp 98 F (36.7 C) (Oral)   Resp 18   SpO2 95%   Physical Exam Vitals and nursing note reviewed.  Pulmonary:     Breath sounds: No decreased breath sounds, wheezing, rhonchi or rales.  Musculoskeletal:     Comments: Some tenderness to left hip laterally.  No rash.  No specific lumbar tenderness.  Does have some pain with movement of the left hip.  Also tenderness over left calf.  Patient has strong dorsalis pedis pulse.  Neurological:     Mental Status: He is alert.     (all labs ordered are listed, but only abnormal results are displayed) Labs Reviewed  BASIC METABOLIC PANEL WITH GFR  CBC  CK  TROPONIN T, HIGH SENSITIVITY    EKG: None  Radiology: No results found.  {Document cardiac  monitor, telemetry assessment procedure when appropriate:32947} Procedures   Medications Ordered in the ED - No data to display    {Click here for ABCD2, HEART and other calculators REFRESH Note before signing:1}                              Medical Decision Making Amount and/or Complexity of Data Reviewed Labs: ordered. Radiology: ordered.   Patient with left lower extremity pain.  May have a small amount of swelling.  History of DVT and must consider this in the differential.  Also arterial occlusion considered but does have strong dorsalis pedis pulse.  I think this makes claudication less likely.  Will get Doppler.  Will get basic blood work including CK since he  is having calf tenderness.  Reviewed neurology note and orthopedic note.  Will also get the stat MRI that was ordered yesterday.  {Document critical care time when appropriate  Document review of labs and clinical decision tools ie CHADS2VASC2, etc  Document your independent review of radiology images and any outside records  Document your discussion with family members, caretakers and with consultants  Document social determinants of health affecting pt's care  Document your decision making why or why not admission, treatments were needed:32947:::1}   Final diagnoses:  None    ED Discharge Orders     None        "

## 2024-11-29 NOTE — Discharge Instructions (Addendum)
 Take the increased dose your pain medicine.  Follow-up with Dr. Joshua.  Hopefully the steroids can help with the pain.

## 2024-11-29 NOTE — ED Notes (Addendum)
 Pt stated he has no history of asthma, nor COPD.  Pt stated that he has a rare heart condition, bilateral breath sounds, clear, Sp02 99% room air, SOB with exertion.

## 2024-11-29 NOTE — Telephone Encounter (Signed)
 Patient wants to know if he can get a refill on meds he will run out on Sunday. He went to Ed this morning and they double the dosage so he needs a refill for pain so he want run out by Sunday of oxycodone 

## 2024-12-02 ENCOUNTER — Other Ambulatory Visit: Payer: Self-pay | Admitting: Sports Medicine

## 2024-12-02 ENCOUNTER — Other Ambulatory Visit (HOSPITAL_BASED_OUTPATIENT_CLINIC_OR_DEPARTMENT_OTHER): Payer: Self-pay

## 2024-12-02 DIAGNOSIS — G8929 Other chronic pain: Secondary | ICD-10-CM

## 2024-12-09 ENCOUNTER — Telehealth (HOSPITAL_BASED_OUTPATIENT_CLINIC_OR_DEPARTMENT_OTHER): Payer: Self-pay | Admitting: *Deleted

## 2024-12-09 ENCOUNTER — Telehealth: Payer: Self-pay | Admitting: Orthopaedic Surgery

## 2024-12-09 NOTE — Telephone Encounter (Signed)
 Dr. Santo  You saw this patient on 10/17/2024. Per protocol we request that you comment on his cardiac risk to proceed with LEFT TOTAL KNEE ARTHROPLASTY scheduled for 12/24/2024, since it has been less than 2 months since evaluated in the office. Please send your comment to P CV Pre-Op Pool.  Thank you, Lamarr Satterfield DNP, ANP, AACC.

## 2024-12-09 NOTE — Telephone Encounter (Signed)
 Separate clearance request was sent to Dr. Federico, hematlologist.

## 2024-12-09 NOTE — Telephone Encounter (Signed)
"  ° °  Patient Name: Nathan [name]  DOB: 08-Aug-1946 MRN: [id number]  Primary Cardiologist: Stanly DELENA Leavens, MD  Chart reviewed as part of pre-operative protocol coverage. Given past medical history and time since last visit, based on ACC/AHA guidelines, Nathan [name] is at acceptable risk for the planned procedure without further cardiovascular testing.   Per Dr. Leavens 12/09/2024 At my last evaluation, he had NYHA Class II symptoms and his biggest symptom was musculoskeletal.  Unless there was a clinical change, it is reasonable to proceed; we have also been monitoring of his aortic valve.  I have reached out to his surgeons in the past: I recommend his tissue from surgery be sent for [place] Red Staining to look at amyloid deposition.   Thanks, MAC  The patient was advised that if he develops new symptoms prior to surgery to contact our office to arrange for a follow-up visit, and he verbalized understanding.  I will route this recommendation to the requesting party via Epic fax function and remove from pre-op pool.  Please call with questions.  Lamarr Satterfield, NP 12/09/2024, 3:47 PM  "

## 2024-12-09 NOTE — Telephone Encounter (Signed)
 Spoke with surgeon's office regarding Xarelto  recommendations to come from Hematology/Oncology as Dr. Federico prescribes if for DVT.

## 2024-12-09 NOTE — Telephone Encounter (Signed)
 Patient's Xarelto  is prescribed by Hematology/Oncology for DVT,  and not cardiology. Please have surgeon reach out to Dr.Dorsey for recommendations.

## 2024-12-09 NOTE — Telephone Encounter (Signed)
"  ° °  Pre-operative Risk Assessment    Patient Name: Nathan [name]  DOB: September 06, 1946 MRN: [id number]   Date of last office visit: 10/17/24 [facility]. [facility] Date of next office visit: NONE   Request for Surgical Clearance    Procedure:  LEFT TOTAL KNEE ARTHROPLASTY  Date of Surgery:  Clearance 12/24/24                                Surgeon:  DR. LONNI POLI Surgeon's Group or Practice Name:  [facility] Phone number:  [phone]  Fax number:  [phone] ATTN: SHERRIE   Type of Clearance Requested:   - Medical  - Pharmacy:  Hold Rivaroxaban  (Xarelto ) x 5 DAYS PRIOR/[facility]   Type of Anesthesia:  General  vs SPINAL  Additional requests/questions:    [name]   12/09/2024, 3:16 PM   "

## 2024-12-09 NOTE — Telephone Encounter (Signed)
 Amieya from [facility] called regarding his cardiac pre op clearance and that Dr. Vernetta requested that his Zorelto be withheld, but that would need to come from the pt hematologist, not Dr. Vernetta. Call back number is [phone]

## 2024-12-10 ENCOUNTER — Telehealth (HOSPITAL_BASED_OUTPATIENT_CLINIC_OR_DEPARTMENT_OTHER): Payer: Self-pay | Admitting: *Deleted

## 2024-12-10 NOTE — Telephone Encounter (Signed)
"  ° °  Pre-operative Risk Assessment    Patient Name: Nathan [name]  DOB: [birth year] MRN: [id number]   Date of last office visit: 10/17/24 DR. CHANDRASEKHAR Date of next office visit: NONE   Request for Surgical Clearance    Procedure:  TFESI LEFT L3-4, L4-5  Date of Surgery:  Clearance TBD (PER FORM STAT)                               Surgeon:  DR.DAVE [facility] Surgeon's Group or Practice Name:  [facility] Phone number:  [phone] Fax number:  [phone]   Type of Clearance Requested:   - Medical  - Pharmacy:  Hold Rivaroxaban  (Xarelto ) x 3 DAYS PRIOR AND RESUME THE NEXT DAY AFTER PROCEDURE    Type of Anesthesia:  Not Indicated   Additional requests/questions:    Signed, Waunita Sandstrom   12/10/2024, 3:10 PM   "

## 2024-12-10 NOTE — Telephone Encounter (Signed)
" ° °  Patient Name: Nathan [name]  DOB: March 02, 1946 MRN: [id number]  Primary Cardiologist: Stanly DELENA Leavens, MD  Chart reviewed as part of pre-operative protocol coverage. Pre-op clearance already addressed by colleagues in earlier phone notes. To summarize recommendations:  - Cleared yesterday for total knee arthroplasty taking place on 12/24/2024.  Today, we received a clearance request for a spinal injection L3-4, L4-5 with [facility].  Since this is a less invasive procedure, clearance still stands.  I will attach Dr. Milan evaluation below.  Per Dr. Leavens 12/09/2024 At my last evaluation, he had NYHA Class II symptoms and his biggest symptom was musculoskeletal.  Unless there was a clinical change, it is reasonable to proceed; we have also been monitoring of his aortic valve.  I have reached out to his surgeons in the past: I recommend his tissue from surgery be sent for [place] Red Staining to look at amyloid deposition.   Thanks, MAC  As before, Xarelto  holding parameters will need to come from heme-onc.   Will route this bundled recommendation to requesting provider via Epic fax function and remove from pre-op pool. Please call with questions.  Nathan LOISE Fabry, PA-C 12/10/2024, 3:22 PM  "

## 2024-12-11 ENCOUNTER — Other Ambulatory Visit: Payer: Self-pay | Admitting: Physician Assistant

## 2024-12-11 DIAGNOSIS — Z01818 Encounter for other preprocedural examination: Secondary | ICD-10-CM

## 2024-12-13 ENCOUNTER — Encounter: Payer: Self-pay | Admitting: Physical Medicine and Rehabilitation

## 2024-12-13 ENCOUNTER — Ambulatory Visit: Admitting: Physical Medicine and Rehabilitation

## 2024-12-13 ENCOUNTER — Encounter: Payer: Self-pay | Admitting: Hematology and Oncology

## 2024-12-13 ENCOUNTER — Encounter: Payer: Self-pay | Admitting: Internal Medicine

## 2024-12-13 ENCOUNTER — Telehealth: Payer: Self-pay

## 2024-12-13 DIAGNOSIS — M48062 Spinal stenosis, lumbar region with neurogenic claudication: Secondary | ICD-10-CM | POA: Diagnosis not present

## 2024-12-13 DIAGNOSIS — M5442 Lumbago with sciatica, left side: Secondary | ICD-10-CM | POA: Diagnosis not present

## 2024-12-13 DIAGNOSIS — G8929 Other chronic pain: Secondary | ICD-10-CM | POA: Diagnosis not present

## 2024-12-13 DIAGNOSIS — M5416 Radiculopathy, lumbar region: Secondary | ICD-10-CM

## 2024-12-13 NOTE — Telephone Encounter (Signed)
 Ok to give [facility] before his surgery with us ?

## 2024-12-13 NOTE — Progress Notes (Signed)
 "  [name] - 79 y.o. male MRN [id number]  Date of birth: 04-28-1946  Office Visit Note: Visit Date: 12/13/2024 PCP: Nathan [name]., MD Referred by: Nathan [name]., MD  Subjective: Chief Complaint  Patient presents with   Lower Back - Pain   HPI: Nathan [name] is a 79 y.o. male who comes in today per the request of Dr. Lonell Sprang for evaluation of chronic, worsening and severe left sided lower back pain radiating to buttock, groin, anterolateral thigh and down anterior portion of leg to ankle. Pain started the day before Thanksgiving. His pain worsens with prolonged standing and walking. He describes pain as constant sore sensation, currently rates as 8 out of 10.   He was evaluated at [facility] shortly after onset where he received intramuscular injection and oral Prednisone . He was also recently evaluated by my colleague Dr. Lonell Sprang where he underwent left greater trochanter injection. He reports minimal relief of pain for only a few days. He is taking Oxycodone  IR that helps to alleviate more severe pain. More recently, he was evaluated in the emergency room on 11/29/2024 due to worsening symptoms. He underwent LLE doppler and MRI imaging of lumbar spine. 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 Recent lumbar MRI imaging shows multi level lumbar spine degeneration. Progressed severe spinal canal stenosis at L4-L5 with suspected sequestered disc fragment causing possible left L4 radiculitis. Severe left foraminal stenosis at L4-L5. There is moderate spinal canal stenosis at the level of L2-L3, moderate to severe left lateral recess stenosis also noted at L2-L3. No history of lumbar surgery/injections.   Of note, he rolled out of bed this morning and fell to floor. He did strike head on ground, denies LOC. Abrasion noted to left side of face.      Review of Systems  Musculoskeletal:  Positive for back pain.  Neurological:   Negative for tingling, sensory change, focal weakness and weakness.  All other systems reviewed and are negative.  Otherwise per HPI.  Assessment & Plan: Visit Diagnoses:    ICD-10-CM   1. Chronic left-sided low back pain with left-sided sciatica  M54.42 Ambulatory referral to Physical Medicine Rehab   G89.29     2. Lumbar radiculopathy  M54.16 Ambulatory referral to Physical Medicine Rehab    3. Spinal stenosis of lumbar region with neurogenic claudication  M48.062 Ambulatory referral to Physical Medicine Rehab       Plan: Findings:  Chronic, worsening and severe left sided lower back pain radiating to buttock, groin, anterolateral thigh and down anterior portion of leg to ankle. Patient continues to have severe pain despite good conservative therapies such as home exercise regimen, rest and use of medications. I discussed recent lumbar MRI with him today using imaging and spine model. There is severe spinal canal stenosis at L4-L5, moderate at L2-L3 with severe left sided lateral recess narrowing at this level. His pain pattern is more L3 nerve distribution. Next step is to perform diagnostic and hopefully therapeutic left L3 transforaminal epidural steroid injection. The corticosteroid medication should spread to both levels of central canal stenosis. If good relief of pain with injection we can repeat this procedure infrequently as needed. He is currently taking Xarelto , he can stay on this medication for transforaminal injection. His exam today is non focal, there is some weakness with left knee flexion and extension. This is likely due to pain from advanced osteoarthritis of left knee. No  myelopathic symptoms noted.   The timing of this injection might be difficult. He has planned left knee arthroplasty procedure with Dr. Vernetta on 12/24/2024. Unfortunately, we will need to get approval for injection and we are currently booked out several weeks from the holiday season. I encouraged him to  speak with Dr. Vernetta about moving forward with surgery. I did speak with Rosina, CMA today that works with Dr. Vernetta, there is no concern that corticosteroid injection in the spine with impact his plan for surgery. I will go ahead and get order placed.   He did sustain fall out of bed this morning and is currently taking Xarelto . He is alert and oriented, able to answer questions appropriately during our visit today. I encouraged hm to seek emergency care for any changes in vision, headache, slurred speech and balance problems as these can be signs of intracranial hemorrhage. He verbalized understanding.      Meds & Orders: No orders of the defined types were placed in this encounter.   Orders Placed This Encounter  Procedures   Ambulatory referral to Physical Medicine Rehab    Follow-up: Return for Left L3 transforaminal epidural steroid injection.   Procedures: No procedures performed      Clinical History: MRI LUMBAR SPINE   FINDINGS:   BONES AND ALIGNMENT: Transitional anatomy as described on the 11/19/2022 lumbar MRI. Fully lumbarized S1 level. Correlation with radiographs is recommended prior to any operative intervention. Stable lumbar lordosis since 2023, mildly exaggerated and with grade 1 anterolisthesis of L5-S1 measuring 5 to 6 mm. Chronic degenerative retrolisthesis also at L1-L2 and L2-L3 is stable, as is subtle retrolisthesis of L4 on L5. No significant scoliosis. Stable vertebral height. Background marrow signal is normal. No marrow edema. Intact visible sacrum and SI joints.   SPINAL CORD: The conus terminates normally. Conus medullaris was better demonstrated at the T12-L1 level on the previous MRI.   SOFT TISSUES: No paraspinal mass.   DEGENERATIVE:   T12-L1: Anterior mostly anterior disc bulging. Moderate facet and ligamentum flavum hypertrophy greater on the right. No spinal stenosis. Mild right T12 neural foraminal stenosis.   L1-L2: Mild  retrolisthesis. Circumferential disc osteophyte complex asymmetric to the right. Mild posterior element hypertrophy. Mild spinal and right lateral recess stenosis. Mild neural foraminal stenosis. This level is stable.   L2-L3: Retrolisthesis, disc space loss, bulky circumferential disc osteophyte complex asymmetric to the left. Asymmetric up to moderate facet and ligamentum flavum hypertrophy greater on the left. Moderate spinal stenosis (series 8 image 9) with moderate to severe left lateral recess stenosis (descending left L3 nerve level). Severe left and moderate right L2 neural foraminal stenosis. The spinal stenosis at this level has progressed.   L3-L4: Chronic circumferential disc osteophyte complex asymmetric to the left. Mild facet and ligamentum flavum hypertrophy. No significant spinal or lateral recess stenosis. Moderate right L3 neural foraminal stenosis. This level is stable.   L4-L5: Appearance suspicious for small cephalad disc extrusion and sequestered disc fragment posterior to the left L4 vertebral body on both series 8 image 18 and series 5 image 10, size estimated at 8 mm. Superimposed bulky circumferential disc osteophyte complex, moderate to severe facet and ligamentum flavum hypertrophy. And subsequent severe spinal stenosis, severe bilateral lateral recess stenosis at the descending L5 nerve levels. Severe stenosis at the descending and exiting left L4 nerve level including multifactorial severe left neural foraminal stenosis (series 5 image 12). Moderate to severe right L4 neural foraminal stenosis. This level has progressed.  L5-S1: Chronic grade 1 anterolisthesis. Circumferential disc / pseudodisc with endplate spurring. Severe facet hypertrophy. Less pronounced ligamentum flavum hypertrophy. Mild spinal stenosis with severe bilateral lateral recess stenosis (descending S1 nerve level series 8 image 26). Moderate to severe left and mild to moderate right  L5 neural foraminal stenosis. This level is stable.   S1-S2: Lumbarized. Mild disc bulging. Moderate to severe facet hypertrophy. No spinal stenosis. Mild left lateral recess stenosis (descending left S2 nerve level). This level is stable.   IMPRESSION: 1. Transitional anatomy with fully lumbarized S1, as detailed on 11/19/2022 MRI; correlation with radiographs is recommended prior to any operative intervention. 2. Widespread advanced lumbar spine degeneration in the setting of chronic multilevel mild spondylolisthesis. No acute osseous abnormality 3. Progressed severe spinal stenosis at L4-L5, with suspected sequestered disc fragment (8 mm) along the course of the descending and exiting left L4 nerve. Query Left L4 radiculitis. 4. Progressed moderate spinal stenosis at L2-L3. Chronic mild spinal stenosis at L1-L2 and L5-S1. Chronic severe neural neural foraminal stenosis at the left L2, left greater than right L4 nerve levels.   Electronically signed by: Helayne Hurst MD 11/29/2024 11:03 AM EST RP Workstation: [id number]   He reports that he quit smoking about 55 years ago. His smoking use included cigarettes. He has never used smokeless tobacco. No results for input(s): HGBA1C, LABURIC in the last 8760 hours.  Objective:  VS:  HT:    WT:   BMI:     BP:   HR: bpm  TEMP: ( )  RESP:  Physical Exam Vitals and nursing note reviewed.  HENT:     Head: Normocephalic and atraumatic.     Right Ear: External ear normal.     Left Ear: External ear normal.     Nose: Nose normal.     Mouth/Throat:     Mouth: Mucous membranes are moist.  Eyes:     Extraocular Movements: Extraocular movements intact.  Cardiovascular:     Rate and Rhythm: Normal rate.     Pulses: Normal pulses.  Pulmonary:     Effort: Pulmonary effort is normal.  Abdominal:     General: Abdomen is flat. There is no distension.  Musculoskeletal:        General: Tenderness present.     Cervical back: Normal range  of motion.     Comments: Patient rises from seated position to standing without difficulty. Good lumbar range of motion. No pain noted with facet loading. 5/5 strength noted with right hip flexion, knee flexion/extension, ankle dorsiflexion/plantarflexion and EHL. 4/5 strength noted with left knee flexion and extension, this is likely due to pain from advanced osteoarthritis of left knee. No clonus noted bilaterally. No pain upon palpation of greater trochanters. No pain with internal/external rotation of bilateral hips. Sensation intact bilaterally. Negative slump test bilaterally. Ambulates without aid, gait steady.     Skin:    General: Skin is warm and dry.     Capillary Refill: Capillary refill takes less than 2 seconds.  Neurological:     General: No focal deficit present.     Mental Status: He is alert and oriented to person, place, and time.  Psychiatric:        Mood and Affect: Mood normal.        Behavior: Behavior normal.     Ortho Exam  Imaging: No results found.  Past Medical/Family/Surgical/Social History: Medications & Allergies reviewed per EMR, new medications updated. Patient Active Problem List   Diagnosis Date Noted  Unilateral primary osteoarthritis, left knee 09/16/2024   Unilateral primary osteoarthritis, right knee 09/16/2024   Bone mass 11/13/2023   Nonrheumatic aortic valve stenosis 11/13/2023   Cardiac amyloidosis (HCC) 11/13/2023   History of adenomatous polyp of colon 01/07/2022   Pain in left hip 01/07/2022   Low back pain 08/27/2021   Ischial bursitis 05/19/2021   Lumbar radiculopathy 11/16/2020   Acquired trigger finger of left ring finger 10/19/2020   Bursitis of right shoulder 10/19/2020   Palpitations 06/08/2020   Trochanteric bursitis of left hip 03/27/2020   Aortic valve disorder 10/09/2019   Insomnia 12/11/2018   Osteoarthritis of carpometacarpal (CMC) joint of thumb 11/07/2018   Dry eye syndrome, bilateral 09/13/2018   Choroidal nevus,  right 09/13/2018   Epiretinal membrane (ERM) of left eye 09/13/2018   Nuclear sclerosis, bilateral 09/13/2018   Hardening of the aorta (main artery of the heart) 05/10/2018   Impaired fasting glucose 05/10/2018   Irritable bowel syndrome with diarrhea 05/10/2018   Radial styloid tenosynovitis 12/19/2017   Right carpal tunnel syndrome 02/08/2017   Medication intolerance 09/02/2016   Anxiety 06/06/2016   Paresthesia and pain of right extremity 01/04/2016   CAD (coronary artery disease) of artery bypass graft 06/19/2014   Hyperlipidemia 06/19/2014   Essential hypertension 06/19/2014   Inguinal hernia, left 03/12/2014   Posterior vitreous detachment 08/23/2012   Retinal tear 08/23/2012   Age-related nuclear cataract 04/06/2012   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    Blood transfusion without reported diagnosis    Complication of anesthesia    jittery after surgery   Coronary artery disease    GERD (gastroesophageal reflux disease)    History of DVT (deep vein thrombosis)    Hyperlipemia    Hypertension    IFG (impaired fasting glucose)    Right carpal tunnel syndrome 02/08/2017   Seasonal allergies    Wears glasses    Family History  Problem Relation Age of Onset   Heart disease Mother    Congestive Heart Failure Mother    Cancer Father    Bladder Cancer Father    Alcohol  abuse Brother    Kidney failure Brother    Past Surgical History:  Procedure Laterality Date   APPENDECTOMY     age 49   COLONOSCOPY     COLONOSCOPY WITH PROPOFOL  N/A 02/15/2016   Procedure: COLONOSCOPY WITH PROPOFOL ;  Surgeon: Gladis MARLA Louder, MD;  Location: [facility];  Service: Endoscopy;  Laterality: N/A;   CORONARY ARTERY BYPASS GRAFT     2003   ESOPHAGOGASTRODUODENOSCOPY (EGD) WITH PROPOFOL  N/A 02/15/2016   Procedure: ESOPHAGOGASTRODUODENOSCOPY (EGD) WITH PROPOFOL ;  Surgeon: Gladis MARLA Louder, MD;   Location: [facility];  Service: Endoscopy;  Laterality: N/A;   EXPLORATION POST OPERATIVE OPEN HEART     EYE SURGERY     detached retina-lt   INGUINAL HERNIA REPAIR Left 04/01/2014   Procedure: HERNIA REPAIR INGUINAL ADULT;  Surgeon: Alm VEAR Angle, MD;  Location: [facility];  Service: General;  Laterality: Left;   INSERTION OF MESH Left 04/01/2014   Procedure: INSERTION OF MESH;  Surgeon: Alm VEAR Angle, MD;  Location: [facility];  Service: General;  Laterality: Left;   KNEE ARTHROCENTESIS  1980   right   SHOULDER ACROMIOPLASTY  1985   rt   SINUS ENDO W/FUSION     TONSILLECTOMY AND ADENOIDECTOMY     Social History  Occupational History   Occupation: [employer]  Tobacco Use   Smoking status: Former    Current packs/day: 0.00    Types: Cigarettes    Quit date: 03/26/1969    Years since quitting: 55.7   Smokeless tobacco: Never  Vaping Use   Vaping status: Never Used  Substance and Sexual Activity   Alcohol  use: Yes    Alcohol /week: 0.0 standard drinks of alcohol     Comment: 2 per day   Drug use: No   Sexual activity: Not Currently   "

## 2024-12-13 NOTE — Progress Notes (Signed)
 Pain Scale   Average Pain 9 Patient advising he has lower back pain that radiates to left leg pain only started three weeks ago, However patient fell out of bed today and injured his lower back and left shoulder.        +Driver, -BT, -Dye Allergies.

## 2024-12-15 ENCOUNTER — Encounter: Payer: Self-pay | Admitting: Hematology and Oncology

## 2024-12-16 ENCOUNTER — Other Ambulatory Visit (HOSPITAL_BASED_OUTPATIENT_CLINIC_OR_DEPARTMENT_OTHER): Payer: Self-pay

## 2024-12-16 ENCOUNTER — Ambulatory Visit (INDEPENDENT_AMBULATORY_CARE_PROVIDER_SITE_OTHER): Admitting: Orthopaedic Surgery

## 2024-12-16 ENCOUNTER — Other Ambulatory Visit: Payer: Self-pay

## 2024-12-16 ENCOUNTER — Encounter: Payer: Self-pay | Admitting: Orthopaedic Surgery

## 2024-12-16 ENCOUNTER — Encounter (HOSPITAL_COMMUNITY): Payer: Self-pay | Admitting: Internal Medicine

## 2024-12-16 DIAGNOSIS — M5442 Lumbago with sciatica, left side: Secondary | ICD-10-CM

## 2024-12-16 DIAGNOSIS — G8929 Other chronic pain: Secondary | ICD-10-CM | POA: Diagnosis not present

## 2024-12-16 MED ORDER — RIVAROXABAN 10 MG PO TABS
10.0000 mg | ORAL_TABLET | Freq: Every day | ORAL | 3 refills | Status: AC
  Filled 2024-12-16: qty 90, 90d supply, fill #0

## 2024-12-16 NOTE — Progress Notes (Signed)
 Nathan [name] comes in today due to worsening left sided low back pain with radicular symptoms.  He saw Duwaine Pouch, NP here at the office on Friday and was very impressed and pleased with her assessment.  He is scheduled to have a knee replacement for his left knee next Tuesday, January 13 but needs that canceled.  His back is really become an issue for him.  He is on chronic blood thinning medications and has to be bridged with Lovenox when he comes off the blood thinner due to other comorbidities.  He actually has been to see his primary care physician Dr. Loreli with [facility] tomorrow.  I did let him know that I am fine with postponing his knee replacement until he can get his lumbar spine in a better position to tolerate the pain from surgery and the rehab that is needed for his left knee.   At this point I am fine with him proceeding with treatments for his lumbar spine including an epidural steroid injection.  He would have to be bridged with Lovenox after coming off of Xarelto  so that he can safely have any type of procedure or surgery.  We can then look later into getting him rescheduled for a left knee replacement.

## 2024-12-17 ENCOUNTER — Other Ambulatory Visit (HOSPITAL_BASED_OUTPATIENT_CLINIC_OR_DEPARTMENT_OTHER): Payer: Self-pay

## 2024-12-18 NOTE — Addendum Note (Signed)
 Addended by: EILLEEN ROSINA HERO on: 12/18/2024 09:55 AM   Modules accepted: Orders

## 2024-12-20 ENCOUNTER — Telehealth: Payer: Self-pay

## 2024-12-20 ENCOUNTER — Telehealth: Payer: Self-pay | Admitting: Physical Medicine and Rehabilitation

## 2024-12-20 NOTE — Telephone Encounter (Signed)
 Pre procedural Valium , [facility] to [facility]

## 2024-12-20 NOTE — Telephone Encounter (Signed)
 Pt  returned call saying that Megan and Vernetta confirmed that he would need to be off his blood thinner 3 days before and after his injection. He is confused about this.Call back number is [phone].

## 2024-12-20 NOTE — Telephone Encounter (Signed)
 Patient called and advised he was unsure about taking his blood thinners . I advised patient that the injection he was schedule to get was an Transforaminal with does not require him to stop blood thinners.

## 2024-12-23 ENCOUNTER — Telehealth: Payer: Self-pay | Admitting: Physical Medicine and Rehabilitation

## 2024-12-23 ENCOUNTER — Encounter (HOSPITAL_COMMUNITY): Payer: Self-pay | Admitting: Internal Medicine

## 2024-12-23 ENCOUNTER — Other Ambulatory Visit (HOSPITAL_BASED_OUTPATIENT_CLINIC_OR_DEPARTMENT_OTHER): Payer: Self-pay

## 2024-12-23 ENCOUNTER — Telehealth: Payer: Self-pay

## 2024-12-23 ENCOUNTER — Other Ambulatory Visit: Payer: Self-pay | Admitting: Physical Medicine and Rehabilitation

## 2024-12-23 DIAGNOSIS — M5416 Radiculopathy, lumbar region: Secondary | ICD-10-CM

## 2024-12-23 MED ORDER — DIAZEPAM 5 MG PO TABS
ORAL_TABLET | ORAL | 0 refills | Status: AC
  Filled 2024-12-23: qty 1, 1d supply, fill #0

## 2024-12-23 NOTE — Telephone Encounter (Signed)
 Pt called saying that he is supposed to take a pill before his procedure tomorrow, but the pharmacy hasn't received the prescription yet. [facility]. Call back number is [phone]

## 2024-12-23 NOTE — Telephone Encounter (Signed)
 Pre procedural Valium --injection tomorrow

## 2024-12-24 ENCOUNTER — Other Ambulatory Visit: Payer: Self-pay

## 2024-12-24 ENCOUNTER — Ambulatory Visit: Admitting: Physical Medicine and Rehabilitation

## 2024-12-24 ENCOUNTER — Encounter: Admission: RE | Payer: Self-pay

## 2024-12-24 ENCOUNTER — Ambulatory Visit (HOSPITAL_COMMUNITY): Admission: RE | Admit: 2024-12-24 | Source: Home / Self Care | Admitting: Orthopaedic Surgery

## 2024-12-24 VITALS — BP 136/84 | HR 78

## 2024-12-24 DIAGNOSIS — M5416 Radiculopathy, lumbar region: Secondary | ICD-10-CM | POA: Diagnosis not present

## 2024-12-24 SURGERY — ARTHROPLASTY, KNEE, TOTAL
Anesthesia: Choice | Site: Knee | Laterality: Left

## 2024-12-24 MED ORDER — METHYLPREDNISOLONE ACETATE 40 MG/ML IJ SUSP
40.0000 mg | Freq: Once | INTRAMUSCULAR | Status: AC
  Administered 2024-12-24: 40 mg

## 2024-12-24 NOTE — Progress Notes (Signed)
 Pain Scale   Average Pain 4 Patient advising he has chronic lower back pain that increases when getting up in morning and decreases when walking during the day.        +Driver, -BT, -Dye Allergies.

## 2024-12-24 NOTE — Therapy (Signed)
 " OUTPATIENT PHYSICAL THERAPY THORACOLUMBAR EVALUATION   Patient Name: Nathan [name] MRN: [id number] DOB:07/09/1946, 79 y.o., male Today's Date: 12/25/2024  END OF SESSION:  PT End of Session - 12/25/24 1255     Visit Number 1    Number of Visits 16    Date for Recertification  02/19/25    Authorization Type MEDICARE    Progress Note Due on Visit 10    PT Start Time 1304    PT Stop Time 1350    PT Time Calculation (min) 46 min    Equipment Utilized During Treatment --    Activity Tolerance Patient tolerated treatment well    Behavior During Therapy WFL for tasks assessed/performed          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    Blood transfusion without reported diagnosis    Complication of anesthesia    jittery after surgery   Coronary artery disease    GERD (gastroesophageal reflux disease)    History of DVT (deep vein thrombosis)    Hyperlipemia    Hypertension    IFG (impaired fasting glucose)    Right carpal tunnel syndrome 02/08/2017   Seasonal allergies    Wears glasses    Past Surgical History:  Procedure Laterality Date   APPENDECTOMY     age 67   COLONOSCOPY     COLONOSCOPY WITH PROPOFOL  N/A 02/15/2016   Procedure: COLONOSCOPY WITH PROPOFOL ;  Surgeon: Gladis MARLA Louder, MD;  Location: [facility];  Service: Endoscopy;  Laterality: N/A;   CORONARY ARTERY BYPASS GRAFT     2003   ESOPHAGOGASTRODUODENOSCOPY (EGD) WITH PROPOFOL  N/A 02/15/2016   Procedure: ESOPHAGOGASTRODUODENOSCOPY (EGD) WITH PROPOFOL ;  Surgeon: Gladis MARLA Louder, MD;  Location: [facility];  Service: Endoscopy;  Laterality: N/A;   EXPLORATION POST OPERATIVE OPEN HEART     EYE SURGERY     detached retina-lt   INGUINAL HERNIA REPAIR Left 04/01/2014   Procedure: HERNIA REPAIR INGUINAL ADULT;  Surgeon: Alm VEAR Angle, MD;  Location: [facility];  Service: General;  Laterality: Left;   INSERTION  OF MESH Left 04/01/2014   Procedure: INSERTION OF MESH;  Surgeon: Alm VEAR Angle, MD;  Location: [facility];  Service: General;  Laterality: Left;   KNEE ARTHROCENTESIS  1980   right   SHOULDER ACROMIOPLASTY  1985   rt   SINUS ENDO W/FUSION     TONSILLECTOMY AND ADENOIDECTOMY     Patient Active Problem List   Diagnosis Date Noted   Unilateral primary osteoarthritis, left knee 09/16/2024   Unilateral primary osteoarthritis, right knee 09/16/2024   Bone mass 11/13/2023   Nonrheumatic aortic valve stenosis 11/13/2023   Cardiac amyloidosis (HCC) 11/13/2023   History of adenomatous polyp of colon 01/07/2022   Pain in left hip 01/07/2022   Low back pain 08/27/2021   Ischial bursitis 05/19/2021   Lumbar radiculopathy 11/16/2020   Acquired trigger finger of left ring finger 10/19/2020   Bursitis of right shoulder 10/19/2020   Palpitations 06/08/2020   Trochanteric bursitis of left hip 03/27/2020   Aortic valve disorder 10/09/2019   Insomnia 12/11/2018   Osteoarthritis of carpometacarpal (CMC) joint of thumb 11/07/2018   Dry eye syndrome, bilateral 09/13/2018   Choroidal nevus, right 09/13/2018   Epiretinal membrane (ERM) of left eye 09/13/2018   Nuclear sclerosis, bilateral 09/13/2018   Hardening of the aorta (main artery of the heart) 05/10/2018  Impaired fasting glucose 05/10/2018   Irritable bowel syndrome with diarrhea 05/10/2018   Radial styloid tenosynovitis 12/19/2017   Right carpal tunnel syndrome 02/08/2017   Medication intolerance 09/02/2016   Anxiety 06/06/2016   Paresthesia and pain of right extremity 01/04/2016   CAD (coronary artery disease) of artery bypass graft 06/19/2014   Hyperlipidemia 06/19/2014   Essential hypertension 06/19/2014   Inguinal hernia, left 03/12/2014   Posterior vitreous detachment 08/23/2012   Retinal tear 08/23/2012   Age-related nuclear cataract 04/06/2012    PCP: Elsie Loreli Overcast , MD  REFERRING PROVIDER: Lonni Poli, MD  REFERRING DIAG: [phone] (ICD-10-CM) - Chronic left-sided low back pain with left-sided sciatica  Rationale for Evaluation and Treatment: Rehabilitation  THERAPY DIAG:  Radiculopathy, lumbosacral region  Other low back pain  Muscle weakness (generalized)  Other abnormalities of gait and mobility  Unsteadiness on feet  ONSET DATE: day before thanksgiving   SUBJECTIVE:                                                                                                                                                                                           SUBJECTIVE STATEMENT: I was screaming while walking just to get to the bathroom.   PERTINENT HISTORY:  Patient reports pain that began after getting out of bed one morning that led to difficulty with walking and severe pain in Rt LE. Patient reports that he went to ED twice since pain started and has seen two MDs since then. Due to pain, patient had to reschedule Lt TKA. Patient has received an epidural injection that has assisted with pain levels to where he no longer needs any extra equipment for help around the house. Patient reports one cervical surgery in the past due to slipped disc that herniated into spinal cord, Rt knee arthroscopy several years ago, and one open heart surgery due to rare heart disease. Patient also reports history of DVTs and bakers cysts in Lt LE.  See PMH or personal factors for in depth comorbidities  PAIN:  Are you having pain? Yes: NPRS scale: 0/10 while sitting at rest; 2/10 at worst following injection; 10/10 at worst prior to injection Pain location: groin, anterior hip, shin, down to anterior ankle  Pain description: spasms, constant pain Aggravating factors: turning to Rt, standing for prolonged periods, lying down (Lt>Rt) Relieving factors: heating pad, elevated feet, pillow under knees/between legs, sitting in his chair  PRECAUTIONS: Fall  RED FLAGS: Bowel or bladder  incontinence: No and Cauda equina syndrome: No   WEIGHT BEARING RESTRICTIONS: No  FALLS:  Has patient fallen in last 6 months? No  LIVING ENVIRONMENT:  Lives with: lives alone Lives in: House/apartment Stairs: Yes: Internal: 15 steps; different sides for each staircase and External: 4 steps; on right going up Has following equipment at home: shower chair and Shower bench  OCCUPATION: working part time, [employer]   PLOF: Independent  PATIENT GOALS: to get stronger, especially in the back  NEXT MD VISIT: not currently scheduled   OBJECTIVE:  Note: Objective measures were completed at Evaluation unless otherwise noted.  DIAGNOSTIC FINDINGS:  1. Transitional anatomy with fully lumbarized S1, as detailed on 11/19/2022 MRI; correlation with radiographs is recommended prior to any operative intervention. 2. Widespread advanced lumbar spine degeneration in the setting of chronic multilevel mild spondylolisthesis. No acute osseous abnormality 3. Progressed severe spinal stenosis at L4-L5, with suspected sequestered disc fragment (8 mm) along the course of the descending and exiting left L4 nerve. Query Left L4 radiculitis. 4. Progressed moderate spinal stenosis at L2-L3. Chronic mild spinal stenosis at L1-L2 and L5-S1. Chronic severe neural neural foraminal stenosis at the left L2, left greater than right L4 nerve levels.  PATIENT SURVEYS:  PSFS: THE PATIENT SPECIFIC FUNCTIONAL SCALE  Place score of 0-10 (0 = unable to perform activity and 10 = able to perform activity at the same level as before injury or problem)  Activity Date: 12/25/2024    Stairs  4    2. Walking  2    3. Strength  2    4. Stability  2    Total Score 2.5      Total Score = Sum of activity scores/number of activities  Minimally Detectable Change: 3 points (for single activity); 2 points (for average score)  [facility] (nd). The Patient Specific Functional Scale . Retrieved from  [email or web address]   COGNITION: Overall cognitive status: Within functional limits for tasks assessed     SENSATION: Light touch: WFL  MUSCLE LENGTH: Tight hamstrings noted with forward flexion during lumbar ROM assessment   POSTURE: rounded shoulders, forward head, and increased thoracic kyphosis    LUMBAR ROM:   AROM Eval 12/25/2024  Flexion Mid shins  Extension 90% limited  Right lateral flexion Can reach knee  Left lateral flexion Can reach knee  Right rotation 25% limited  Left rotation 25% limited   (Blank rows = not tested)  LOWER EXTREMITY ROM:     Passive  Right Eval 12/25/2024 Left Eval 12/25/2024  Hip flexion (seated) [facility] WFL  Hip extension    Hip abduction    Hip adduction    Hip internal rotation    Hip external rotation    Knee flexion    Knee extension    Ankle dorsiflexion    Ankle plantarflexion    Ankle inversion    Ankle eversion     (Blank rows = not tested)  LOWER EXTREMITY MMT:    MMT Right Eval 12/25/2024 Left Eval 12/25/2024  Hip flexion (seated) 4/5 2+/5  Hip extension    Hip abduction    Hip adduction    Hip internal rotation    Hip external rotation    Knee flexion(seated) 4/5 4/5  Knee extension (seated) 4/5 3+/5  Ankle dorsiflexion (seated) 4/5 3+/5  Ankle plantarflexion    Ankle inversion    Ankle eversion     (Blank rows = not tested)    FUNCTIONAL TESTS:  5 times sit to stand: 24.87s   GAIT: Distance walked: not objectively measured  Assistive device utilized: None Level of assistance: supervision  Comments: Lt lateral lean  with Lt stance, increase Lt IR   TREATMENT DATE:  12/25/2024            TherEx:  HEP handout provided with PT providing video and demonstration of each activity for comprehension. Patient performed one set of seated hamstring stretch.   Self-Care:  POC                                                                                                                       PATIENT EDUCATION:  Education details: HEP, POC Person educated: Patient Education method: Programmer, Multimedia, Demonstration, Verbal cues, and Handouts Education comprehension: verbalized understanding, verbal cues required, and needs further education  HOME EXERCISE PROGRAM: Access Code: [email or web address] URL: [email or web address] Date: 12/25/2024 Prepared by: Susannah Daring  Exercises - Supine Lower Trunk Rotation  - 1 x daily - 7 x weekly - 3 sets - 30sec hold - Sit to Stand with Armchair  - 1 x daily - 7 x weekly - 2 sets - 10 reps - Heel Raises with Counter Support  - 1 x daily - 7 x weekly - 2 sets - 10 reps - Seated Hamstring Stretch  - 1 x daily - 7 x weekly - 3 sets - 30sec hold  ASSESSMENT:  CLINICAL IMPRESSION: Patient is a 79 y.o. M who was seen today for physical therapy evaluation and treatment for low back pain with radiculopathy presenting with deficits in strength, motor coordination, functional mobility, ambulation, balance/stability, and higher levels of pain. Patient endorses improvement following epidural injection over last 24 hours. Patient will benefit from skilled PT to address above noted deficits.    OBJECTIVE IMPAIRMENTS: Abnormal gait, decreased activity tolerance, decreased coordination, difficulty walking, decreased ROM, decreased strength, increased muscle spasms, improper body mechanics, postural dysfunction, and pain.   ACTIVITY LIMITATIONS: bending, sitting, standing, sleeping, stairs, and transfers  PARTICIPATION LIMITATIONS: community activity and occupation  PERSONAL FACTORS: Past/current experiences, Time since onset of injury/illness/exacerbation, and 3+ comorbidities: arthritis, GERD, HLD, HTN, history of DVT, CAD, atherosclerosis of CABG  are also affecting patient's functional outcome.   REHAB POTENTIAL: Good  CLINICAL DECISION MAKING: Evolving/moderate complexity  EVALUATION COMPLEXITY:  Moderate   GOALS: Goals reviewed with patient? Yes  SHORT TERM GOALS: Target date: 01/15/2025  Patient will show comprehension and compliance of initial HEP. Baseline: Goal status: INITIAL  2.  Patient will report pain levels no greater than 5/10 in order to show an improved overall quality of life. Baseline:  Goal status: INITIAL  3.  Patient will increase Lt hip flexion MMT to at least 3/5 in order to improve biomechanics with functional mobility. Baseline:  Goal status: INITIAL   LONG TERM GOALS: Target date: 02/19/2025  Patient will be independent with final HEP in order to maintain and progress upon functional gains made within PT. Baseline:  Goal status: INITIAL  2.  Patient will report pain levels no greater than 3/10 in order to show an improved overall quality of life. Baseline:  Goal status: INITIAL  3.  Patient will increase PSFS to at least 4.5 in order to show a significant improvement in subjective disability rating. Baseline:  Goal status: INITIAL  4.  Patient will decrease 5xSTS to at least 18s in order to improve fall risk. Baseline:  Goal status: INITIAL  5.  Patient will increase Lt LE strength overall to match Rt LE in order to improve upon functional mobility.  Baseline:  Goal status: INITIAL    PLAN:  PT FREQUENCY: 1-2x/week  PT DURATION: 8 weeks  PLANNED INTERVENTIONS: [id number]- PT Re-evaluation, 97750- Physical Performance Testing, 97110-Therapeutic exercises, 97530- Therapeutic activity, [id number]- Neuromuscular re-education, 97535- Self Care, 02859- Manual therapy, [id number]- Gait training, [phone]- Orthotic Initial, [phone]- Orthotic/Prosthetic subsequent, [phone]- Canalith repositioning, [phone]- Aquatic Therapy, [phone]- Electrical stimulation (unattended), [phone]- Electrical stimulation (manual), [id number]- Vasopneumatic device, [id number]- Ultrasound, [id number]- Traction (mechanical), [id number]- Ionotophoresis 4mg /ml Dexamethasone , 79439 (1-2 muscles), 20561 (3+ muscles)- Dry  Needling, Patient/Family education, Balance training, Stair training, Taping, Joint mobilization, Joint manipulation, Spinal manipulation, Spinal mobilization, Vestibular training, DME instructions, Cryotherapy, and Moist heat.  PLAN FOR NEXT SESSION: review and add to HEP, neuro assessment (slump), generalized strength and functional mobility, balance assessment    Susannah Daring, PT, DPT 12/25/2024 3:19 PM    "

## 2024-12-25 ENCOUNTER — Ambulatory Visit

## 2024-12-25 DIAGNOSIS — R2689 Other abnormalities of gait and mobility: Secondary | ICD-10-CM

## 2024-12-25 DIAGNOSIS — M5459 Other low back pain: Secondary | ICD-10-CM

## 2024-12-25 DIAGNOSIS — R2681 Unsteadiness on feet: Secondary | ICD-10-CM | POA: Diagnosis not present

## 2024-12-25 DIAGNOSIS — M6281 Muscle weakness (generalized): Secondary | ICD-10-CM | POA: Diagnosis not present

## 2024-12-25 DIAGNOSIS — M5417 Radiculopathy, lumbosacral region: Secondary | ICD-10-CM

## 2025-01-05 NOTE — Progress Notes (Signed)
 "  Nathan [name] - 79 y.o. male MRN [id number]  Date of birth: 1946/09/16  Office Visit Note: Visit Date: 12/24/2024 PCP: Loreli Elsie [name], MD Referred by: Loreli Elsie [name], MD  Subjective: Chief Complaint  Patient presents with   Lower Back - Pain   HPI:  Nathan [name] is a 79 y.o. male who comes in today at the request of Duwaine Pouch, FNP for planned Left L3-4 Lumbar Transforaminal epidural steroid injection with fluoroscopic guidance.  The patient has failed conservative care including home exercise, medications, time and activity modification.  This injection will be diagnostic and hopefully therapeutic.  Please see requesting physician notes for further details and justification.   ROS Otherwise per HPI.  Assessment & Plan: Visit Diagnoses:    ICD-10-CM   1. Lumbar radiculopathy  M54.16 XR C-ARM NO REPORT    Epidural Steroid injection    methylPREDNISolone  acetate (DEPO-MEDROL ) injection 40 mg      Plan: No additional findings.   Meds & Orders:  Meds ordered this encounter  Medications   methylPREDNISolone  acetate (DEPO-MEDROL ) injection 40 mg    Orders Placed This Encounter  Procedures   XR C-ARM NO REPORT   Epidural Steroid injection    Follow-up: Return for visit to requesting provider as needed.   Procedures: No procedures performed  Lumbosacral Transforaminal Epidural Steroid Injection - Sub-Pedicular Approach with Fluoroscopic Guidance  Patient: Nathan [name]      Date of Birth: 09/18/1946 MRN: [id number] PCP: Loreli Elsie [name], MD      Visit Date: 12/24/2024   Universal Protocol:    Date/Time: 12/24/2024  Consent Given By: the patient  Position: PRONE  Additional Comments: Vital signs were monitored before and after the procedure. Patient was prepped and draped in the usual sterile fashion. The correct patient, procedure, and site was verified.   Injection Procedure Details:   Procedure diagnoses: Lumbar radiculopathy [M54.16]     Meds Administered:  Meds ordered this encounter  Medications   methylPREDNISolone  acetate (DEPO-MEDROL ) injection 40 mg    Laterality: Left  Location/Site: L3  Needle:5.0 in., 22 ga.  Short bevel or Quincke spinal needle  Needle Placement: Transforaminal  Findings:    -Comments: Excellent flow of contrast along the nerve, nerve root and into the epidural space.  Procedure Details: After squaring off the end-plates to get a true AP view, the C-arm was positioned so that an oblique view of the foramen as noted above was visualized. The target area is just inferior to the nose of the scotty dog or sub pedicular. The soft tissues overlying this structure were infiltrated with 2-3 ml. of 1% Lidocaine  without Epinephrine .  The spinal needle was inserted toward the target using a trajectory view along the fluoroscope beam.  Under AP and lateral visualization, the needle was advanced so it did not puncture dura and was located close the 6 O'Clock position of the pedical in AP tracterory. Biplanar projections were used to confirm position. Aspiration was confirmed to be negative for CSF and/or blood. A 1-2 ml. volume of Isovue -250 was injected and flow of contrast was noted at each level. Radiographs were obtained for documentation purposes.   After attaining the desired flow of contrast documented above, a 0.5 to 1.0 ml test dose of 0.25% Marcaine  was injected into each respective transforaminal space.  The patient was observed for 90 seconds post injection.  After no sensory deficits were reported, and normal lower extremity motor function was noted,  the above injectate was administered so that equal amounts of the injectate were placed at each foramen (level) into the transforaminal epidural space.   Additional Comments:  The patient tolerated the procedure well Dressing: 2 x 2 sterile gauze and Band-Aid    Post-procedure details: Patient was observed during the  procedure. Post-procedure instructions were reviewed.  Patient left the clinic in stable condition.    Clinical History: MRI LUMBAR SPINE   FINDINGS:   BONES AND ALIGNMENT: Transitional anatomy as described on the 11/19/2022 lumbar MRI. Fully lumbarized S1 level. Correlation with radiographs is recommended prior to any operative intervention. Stable lumbar lordosis since 2023, mildly exaggerated and with grade 1 anterolisthesis of L5-S1 measuring 5 to 6 mm. Chronic degenerative retrolisthesis also at L1-L2 and L2-L3 is stable, as is subtle retrolisthesis of L4 on L5. No significant scoliosis. Stable vertebral height. Background marrow signal is normal. No marrow edema. Intact visible sacrum and SI joints.   SPINAL CORD: The conus terminates normally. Conus medullaris was better demonstrated at the T12-L1 level on the previous MRI.   SOFT TISSUES: No paraspinal mass.   DEGENERATIVE:   T12-L1: Anterior mostly anterior disc bulging. Moderate facet and ligamentum flavum hypertrophy greater on the right. No spinal stenosis. Mild right T12 neural foraminal stenosis.   L1-L2: Mild retrolisthesis. Circumferential disc osteophyte complex asymmetric to the right. Mild posterior element hypertrophy. Mild spinal and right lateral recess stenosis. Mild neural foraminal stenosis. This level is stable.   L2-L3: Retrolisthesis, disc space loss, bulky circumferential disc osteophyte complex asymmetric to the left. Asymmetric up to moderate facet and ligamentum flavum hypertrophy greater on the left. Moderate spinal stenosis (series 8 image 9) with moderate to severe left lateral recess stenosis (descending left L3 nerve level). Severe left and moderate right L2 neural foraminal stenosis. The spinal stenosis at this level has progressed.   L3-L4: Chronic circumferential disc osteophyte complex asymmetric to the left. Mild facet and ligamentum flavum hypertrophy. No significant spinal  or lateral recess stenosis. Moderate right L3 neural foraminal stenosis. This level is stable.   L4-L5: Appearance suspicious for small cephalad disc extrusion and sequestered disc fragment posterior to the left L4 vertebral body on both series 8 image 18 and series 5 image 10, size estimated at 8 mm. Superimposed bulky circumferential disc osteophyte complex, moderate to severe facet and ligamentum flavum hypertrophy. And subsequent severe spinal stenosis, severe bilateral lateral recess stenosis at the descending L5 nerve levels. Severe stenosis at the descending and exiting left L4 nerve level including multifactorial severe left neural foraminal stenosis (series 5 image 12). Moderate to severe right L4 neural foraminal stenosis. This level has progressed.   L5-S1: Chronic grade 1 anterolisthesis. Circumferential disc / pseudodisc with endplate spurring. Severe facet hypertrophy. Less pronounced ligamentum flavum hypertrophy. Mild spinal stenosis with severe bilateral lateral recess stenosis (descending S1 nerve level series 8 image 26). Moderate to severe left and mild to moderate right L5 neural foraminal stenosis. This level is stable.   S1-S2: Lumbarized. Mild disc bulging. Moderate to severe facet hypertrophy. No spinal stenosis. Mild left lateral recess stenosis (descending left S2 nerve level). This level is stable.   IMPRESSION: 1. Transitional anatomy with fully lumbarized S1, as detailed on 11/19/2022 MRI; correlation with radiographs is recommended prior to any operative intervention. 2. Widespread advanced lumbar spine degeneration in the setting of chronic multilevel mild spondylolisthesis. No acute osseous abnormality 3. Progressed severe spinal stenosis at L4-L5, with suspected sequestered disc fragment (8 mm) along the course  of the descending and exiting left L4 nerve. Query Left L4 radiculitis. 4. Progressed moderate spinal stenosis at L2-L3. Chronic mild  spinal stenosis at L1-L2 and L5-S1. Chronic severe neural neural foraminal stenosis at the left L2, left greater than right L4 nerve levels.   Electronically signed by: Helayne Hurst MD 11/29/2024 11:03 AM EST RP Workstation: [id number]     Objective:  VS:  HT:    WT:   BMI:     BP:136/84  HR:78bpm  TEMP: ( )  RESP:  Physical Exam Vitals and nursing note reviewed.  Constitutional:      General: He is not in acute distress.    Appearance: Normal appearance. He is not ill-appearing.  HENT:     Head: Normocephalic and atraumatic.     Right Ear: External ear normal.     Left Ear: External ear normal.     Nose: No congestion.  Eyes:     Extraocular Movements: Extraocular movements intact.  Cardiovascular:     Rate and Rhythm: Normal rate.     Pulses: Normal pulses.  Pulmonary:     Effort: Pulmonary effort is normal. No respiratory distress.  Abdominal:     General: There is no distension.     Palpations: Abdomen is soft.  Musculoskeletal:        General: No tenderness or signs of injury.     Cervical back: Neck supple.     Right lower leg: No edema.     Left lower leg: No edema.     Comments: Patient has good distal strength without clonus.  Skin:    Findings: No erythema or rash.  Neurological:     General: No focal deficit present.     Mental Status: He is alert and oriented to person, place, and time.     Sensory: No sensory deficit.     Motor: No weakness or abnormal muscle tone.     Coordination: Coordination normal.  Psychiatric:        Mood and Affect: Mood normal.        Behavior: Behavior normal.      Imaging: No results found. "

## 2025-01-05 NOTE — Procedures (Signed)
 Lumbosacral Transforaminal Epidural Steroid Injection - Sub-Pedicular Approach with Fluoroscopic Guidance  Patient: Nathan [name]      Date of Birth: [birth year] MRN: [id number] PCP: Loreli Elsie [name], MD      Visit Date: 12/24/2024   Universal Protocol:    Date/Time: 12/24/2024  Consent Given By: the patient  Position: PRONE  Additional Comments: Vital signs were monitored before and after the procedure. Patient was prepped and draped in the usual sterile fashion. The correct patient, procedure, and site was verified.   Injection Procedure Details:   Procedure diagnoses: Lumbar radiculopathy [M54.16]    Meds Administered:  Meds ordered this encounter  Medications   methylPREDNISolone  acetate (DEPO-MEDROL ) injection 40 mg    Laterality: Left  Location/Site: L3  Needle:5.0 in., 22 ga.  Short bevel or Quincke spinal needle  Needle Placement: Transforaminal  Findings:    -Comments: Excellent flow of contrast along the nerve, nerve root and into the epidural space.  Procedure Details: After squaring off the end-plates to get a true AP view, the C-arm was positioned so that an oblique view of the foramen as noted above was visualized. The target area is just inferior to the nose of the scotty dog or sub pedicular. The soft tissues overlying this structure were infiltrated with 2-3 ml. of 1% Lidocaine  without Epinephrine .  The spinal needle was inserted toward the target using a trajectory view along the fluoroscope beam.  Under AP and lateral visualization, the needle was advanced so it did not puncture dura and was located close the 6 O'Clock position of the pedical in AP tracterory. Biplanar projections were used to confirm position. Aspiration was confirmed to be negative for CSF and/or blood. A 1-2 ml. volume of Isovue -250 was injected and flow of contrast was noted at each level. Radiographs were obtained for documentation purposes.   After attaining the desired  flow of contrast documented above, a 0.5 to 1.0 ml test dose of 0.25% Marcaine  was injected into each respective transforaminal space.  The patient was observed for 90 seconds post injection.  After no sensory deficits were reported, and normal lower extremity motor function was noted,   the above injectate was administered so that equal amounts of the injectate were placed at each foramen (level) into the transforaminal epidural space.   Additional Comments:  The patient tolerated the procedure well Dressing: 2 x 2 sterile gauze and Band-Aid    Post-procedure details: Patient was observed during the procedure. Post-procedure instructions were reviewed.  Patient left the clinic in stable condition.

## 2025-01-06 ENCOUNTER — Encounter: Admitting: Orthopaedic Surgery

## 2025-01-09 ENCOUNTER — Encounter

## 2025-01-12 ENCOUNTER — Other Ambulatory Visit: Payer: Self-pay | Admitting: Internal Medicine

## 2025-01-13 ENCOUNTER — Encounter

## 2025-01-15 ENCOUNTER — Encounter

## 2025-01-20 ENCOUNTER — Encounter: Admitting: Rehabilitative and Restorative Service Providers"

## 2025-01-22 ENCOUNTER — Encounter: Admitting: Rehabilitative and Restorative Service Providers"

## 2025-01-27 ENCOUNTER — Encounter: Admitting: Rehabilitative and Restorative Service Providers"

## 2025-01-29 ENCOUNTER — Encounter: Admitting: Rehabilitative and Restorative Service Providers"

## 2025-02-03 ENCOUNTER — Encounter: Admitting: Rehabilitative and Restorative Service Providers"

## 2025-02-04 ENCOUNTER — Encounter (HOSPITAL_COMMUNITY)

## 2025-02-05 ENCOUNTER — Encounter: Admitting: Rehabilitative and Restorative Service Providers"

## 2025-02-10 ENCOUNTER — Encounter: Admitting: Rehabilitative and Restorative Service Providers"

## 2025-02-12 ENCOUNTER — Encounter: Admitting: Rehabilitative and Restorative Service Providers"

## 2025-04-22 ENCOUNTER — Inpatient Hospital Stay: Admitting: Physician Assistant

## 2025-04-22 ENCOUNTER — Inpatient Hospital Stay
